# Patient Record
Sex: Male | Born: 1960 | Race: White | Hispanic: No | Smoking: Current every day smoker
Health system: Southern US, Community
[De-identification: ages and names within clinical notes are randomized; demographics above are authoritative.]

## PROBLEM LIST (undated history)

## (undated) DIAGNOSIS — K219 Gastro-esophageal reflux disease without esophagitis: Secondary | ICD-10-CM

## (undated) DIAGNOSIS — I2510 Atherosclerotic heart disease of native coronary artery without angina pectoris: Secondary | ICD-10-CM

## (undated) DIAGNOSIS — G4733 Obstructive sleep apnea (adult) (pediatric): Secondary | ICD-10-CM

## (undated) DIAGNOSIS — I8393 Asymptomatic varicose veins of bilateral lower extremities: Secondary | ICD-10-CM

## (undated) DIAGNOSIS — R739 Hyperglycemia, unspecified: Secondary | ICD-10-CM

## (undated) DIAGNOSIS — Z9289 Personal history of other medical treatment: Secondary | ICD-10-CM

## (undated) DIAGNOSIS — I739 Peripheral vascular disease, unspecified: Secondary | ICD-10-CM

## (undated) DIAGNOSIS — E119 Type 2 diabetes mellitus without complications: Secondary | ICD-10-CM

## (undated) DIAGNOSIS — I700 Atherosclerosis of aorta: Secondary | ICD-10-CM

## (undated) DIAGNOSIS — Z720 Tobacco use: Secondary | ICD-10-CM

## (undated) DIAGNOSIS — I714 Abdominal aortic aneurysm, without rupture, unspecified: Secondary | ICD-10-CM

## (undated) DIAGNOSIS — I10 Essential (primary) hypertension: Secondary | ICD-10-CM

## (undated) DIAGNOSIS — E785 Hyperlipidemia, unspecified: Secondary | ICD-10-CM

## (undated) DIAGNOSIS — I252 Old myocardial infarction: Secondary | ICD-10-CM

## (undated) HISTORY — DX: Asymptomatic varicose veins of bilateral lower extremities: I83.93

## (undated) HISTORY — DX: Peripheral vascular disease, unspecified: I73.9

## (undated) HISTORY — DX: Abdominal aortic aneurysm, without rupture: I71.4

## (undated) HISTORY — DX: Old myocardial infarction: I25.2

## (undated) HISTORY — DX: Essential (primary) hypertension: I10

## (undated) HISTORY — DX: Atherosclerosis of aorta: I70.0

## (undated) HISTORY — DX: Personal history of other medical treatment: Z92.89

## (undated) HISTORY — DX: Abdominal aortic aneurysm, without rupture, unspecified: I71.40

## (undated) HISTORY — DX: Atherosclerotic heart disease of native coronary artery without angina pectoris: I25.10

## (undated) HISTORY — DX: Obstructive sleep apnea (adult) (pediatric): G47.33

---

## 2004-10-16 ENCOUNTER — Emergency Department (HOSPITAL_COMMUNITY): Admission: EM | Admit: 2004-10-16 | Discharge: 2004-10-16 | Payer: Self-pay | Admitting: Emergency Medicine

## 2013-05-21 ENCOUNTER — Encounter (HOSPITAL_COMMUNITY): Payer: Self-pay | Admitting: Emergency Medicine

## 2013-05-21 ENCOUNTER — Emergency Department (HOSPITAL_COMMUNITY)
Admission: EM | Admit: 2013-05-21 | Discharge: 2013-05-21 | Disposition: A | Discharge status: Home / Self Care | Payer: Self-pay | Attending: Emergency Medicine | Admitting: Emergency Medicine

## 2013-05-21 ENCOUNTER — Emergency Department (HOSPITAL_COMMUNITY): Payer: Self-pay

## 2013-05-21 DIAGNOSIS — Y9389 Activity, other specified: Secondary | ICD-10-CM | POA: Insufficient documentation

## 2013-05-21 DIAGNOSIS — S86912A Strain of unspecified muscle(s) and tendon(s) at lower leg level, left leg, initial encounter: Secondary | ICD-10-CM

## 2013-05-21 DIAGNOSIS — Y929 Unspecified place or not applicable: Secondary | ICD-10-CM | POA: Insufficient documentation

## 2013-05-21 DIAGNOSIS — X500XXA Overexertion from strenuous movement or load, initial encounter: Secondary | ICD-10-CM | POA: Insufficient documentation

## 2013-05-21 DIAGNOSIS — F17200 Nicotine dependence, unspecified, uncomplicated: Secondary | ICD-10-CM | POA: Insufficient documentation

## 2013-05-21 DIAGNOSIS — IMO0002 Reserved for concepts with insufficient information to code with codable children: Secondary | ICD-10-CM | POA: Insufficient documentation

## 2013-05-21 DIAGNOSIS — R209 Unspecified disturbances of skin sensation: Secondary | ICD-10-CM | POA: Insufficient documentation

## 2013-05-21 MED ORDER — HYDROCODONE-ACETAMINOPHEN 5-325 MG PO TABS
1.0000 | ORAL_TABLET | Freq: Once | ORAL | Status: AC
  Administered 2013-05-21: 1 via ORAL
  Filled 2013-05-21: qty 1

## 2013-05-21 MED ORDER — PREDNISONE 20 MG PO TABS
60.0000 mg | ORAL_TABLET | Freq: Once | ORAL | Status: AC
  Administered 2013-05-21: 60 mg via ORAL
  Filled 2013-05-21: qty 3

## 2013-05-21 MED ORDER — PREDNISONE 10 MG PO TABS: 20.0000 mg | ORAL_TABLET | Freq: Every day | ORAL | Status: DC

## 2013-05-21 MED ORDER — HYDROCODONE-ACETAMINOPHEN 5-325 MG PO TABS: 1.0000 | ORAL_TABLET | Freq: Four times a day (QID) | ORAL | Status: DC | PRN

## 2013-05-21 NOTE — ED Provider Notes (Signed)
CSN: [id number]     Arrival date & time 05/21/13  2041 History  This chart was scribed for non-physician practitioner Earley FavorGail Deondray Ospina working with Shon Batonourtney F Horton, MD by Carl Bestelina Holson, ED Scribe. This patient was seen in room WTR7/WTR7 and the patient's care was started at 10:03 PM.    Chief Complaint  Patient presents with  . Knee Pain     Patient is a 53 y.o. male presenting with knee pain. The history is provided by the patient. No language interpreter was used.  Knee Pain  HPI Comments: William Young is a 53 y.o. male who presents to the Emergency Department complaining of constant left knee pain that started yesterday at 11 AM after the patient was getting off the toilet.  He states that he hear an intense popping sound at the time of the incident and experienced some pain.  He states that his knee pain eventually subsided and he went grocery shopping with his fiance.  He states that the pain returned at 7:30 PM - 8 PM last night after the patient went to bed.  He states that he elevated his knee while in bed.  He states that he woke up every hour and a half due to the pain.  He states that at 9:30 AM this morning, he had a hard time walking down the steps.  He states that when he sat down it felt like bone rubbing against bone.  He states that he took Extra Strength Tylenol with relief to his symptoms.  He states that he went to work and took another dose of Tylenol at 6:30 PM and experienced some soreness to his right knee.  He denies icing his knee.  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  He lists numbness and tingling to this side of his left leg bilaterally.  He states that the right knee is hot to the touch but his not erythematous.  He states that his knee usually pops.  The patient states that he is allergic to Aspirin and Ibuprofen.  The patient states that he is unsure as to whether he has a history of Arthritis.    History  reviewed. No pertinent past medical history. History reviewed. No pertinent past surgical history. History reviewed. No pertinent family history. History  Substance Use Topics  . Smoking status: Current Some Day Smoker    Types: Cigarettes  . Smokeless tobacco: Not on file  . Alcohol Use: Yes    Review of Systems  Musculoskeletal: Positive for arthralgias (left knee) and gait problem.  All other systems reviewed and are negative.    Allergies  Aspirin and Ibuprofen  Home Medications   Current Outpatient Rx  Name  Route  Sig  Dispense  Refill  . acetaminophen (TYLENOL) 500 MG tablet   Oral   Take 1,000 mg by mouth every 6 (six) hours as needed for mild pain.         Marland Kitchen. HYDROcodone-acetaminophen (NORCO/VICODIN) 5-325 MG per tablet   Oral   Take 1 tablet by mouth every 6 (six) hours as needed for moderate pain.   12 tablet   0   . predniSONE (DELTASONE) 10 MG tablet   Oral   Take 2 tablets (20 mg total) by mouth daily with breakfast.   13 tablet   0    Triage Vitals: BP 157/89  Pulse 75  Temp(Src) 97.8 F (36.6 C) (Oral)  Resp 18  Ht 6\' 3"  (1.905 m)  Wt 305 lb (138.347 kg)  BMI 38.12 kg/m2  SpO2 99%  Physical Exam  Nursing note and vitals reviewed. Constitutional: He is oriented to person, place, and time. He appears well-developed and well-nourished.  HENT:  Head: Normocephalic and atraumatic.  Right Ear: External ear normal.  Left Ear: External ear normal.  Eyes: Conjunctivae and EOM are normal. Pupils are equal, round, and reactive to light.  Neck: Normal range of motion and phonation normal. Neck supple.  Cardiovascular: Normal rate, regular rhythm, normal heart sounds and intact distal pulses.   Pulmonary/Chest: Effort normal and breath sounds normal. He exhibits no bony tenderness.  Abdominal: Soft. Normal appearance. There is no tenderness.  Musculoskeletal: Normal range of motion.       Left knee: He exhibits normal range of motion and no  erythema. Tenderness found. No MCL and no LCL tenderness noted.  Anterior drawer test is negative.    Neurological: He is alert and oriented to person, place, and time. No cranial nerve deficit or sensory deficit. He exhibits normal muscle tone. Coordination normal.  Skin: Skin is warm, dry and intact.  Psychiatric: He has a normal mood and affect. His behavior is normal. Judgment and thought content normal.    ED Course  Procedures (including critical care time)  DIAGNOSTIC STUDIES: Oxygen Saturation is 99% on room air, normal by my interpretation.    COORDINATION OF CARE: 10:21 PM- Discussed obtaining an x-ray of the patient's left knee with the patient and the patient agreed to the treatment plan.   Labs Review Labs Reviewed - No data to display Imaging Review No results found.  EKG Interpretation   None       MDM   1. Knee strain, left, initial encounter      I personally performed the services described in this documentation, which was scribed in my presence. The recorded information has been reviewed and is accurate.  Arman Filter, NP 05/24/13 2005

## 2013-05-21 NOTE — Discharge Instructions (Signed)
Cryotherapy °Cryotherapy means treatment with cold. Ice or gel packs can be used to reduce both pain and swelling. Ice is the most helpful within the first 24 to 48 hours after an injury or flareup from overusing a muscle or joint. Sprains, strains, spasms, burning pain, shooting pain, and aches can all be eased with ice. Ice can also be used when recovering from surgery. Ice is effective, has very few side effects, and is safe for most people to use. °PRECAUTIONS  °Ice is not a safe treatment option for people with: °· Raynaud's phenomenon. This is a condition affecting small blood vessels in the extremities. Exposure to cold may cause your problems to return. °· Cold hypersensitivity. There are many forms of cold hypersensitivity, including: °· Cold urticaria. Red, itchy hives appear on the skin when the tissues begin to warm after being iced. °· Cold erythema. This is a red, itchy rash caused by exposure to cold. °· Cold hemoglobinuria. Red blood cells break down when the tissues begin to warm after being iced. The hemoglobin that carry oxygen are passed into the urine because they cannot combine with blood proteins fast enough. °· Numbness or altered sensitivity in the area being iced. °If you have any of the following conditions, do not use ice until you have discussed cryotherapy with your caregiver: °· Heart conditions, such as arrhythmia, angina, or chronic heart disease. °· High blood pressure. °· Healing wounds or open skin in the area being iced. °· Current infections. °· Rheumatoid arthritis. °· Poor circulation. °· Diabetes. °Ice slows the blood flow in the region it is applied. This is beneficial when trying to stop inflamed tissues from spreading irritating chemicals to surrounding tissues. However, if you expose your skin to cold temperatures for too long or without the proper protection, you can damage your skin or nerves. Watch for signs of skin damage due to cold. °HOME CARE INSTRUCTIONS °Follow  these tips to use ice and cold packs safely. °· Place a dry or damp towel between the ice and skin. A damp towel will cool the skin more quickly, so you may need to shorten the time that the ice is used. °· For a more rapid response, add gentle compression to the ice. °· Ice for no more than 10 to 20 minutes at a time. The bonier the area you are icing, the less time it will take to get the benefits of ice. °· Check your skin after 5 minutes to make sure there are no signs of a poor response to cold or skin damage. °· Rest 20 minutes or more in between uses. °· Once your skin is numb, you can end your treatment. You can test numbness by very lightly touching your skin. The touch should be so light that you do not see the skin dimple from the pressure of your fingertip. When using ice, most people will feel these normal sensations in this order: cold, burning, aching, and numbness. °· Do not use ice on someone who cannot communicate their responses to pain, such as small children or people with dementia. °HOW TO MAKE AN ICE PACK °Ice packs are the most common way to use ice therapy. Other methods include ice massage, ice baths, and cryo-sprays. Muscle creams that cause a cold, tingly feeling do not offer the same benefits that ice offers and should not be used as a substitute unless recommended by your caregiver. °To make an ice pack, do one of the following: °· Place crushed ice or   a bag of frozen vegetables in a sealable plastic bag. Squeeze out the excess air. Place this bag inside another plastic bag. Slide the bag into a pillowcase or place a damp towel between your skin and the bag.  Mix 3 parts water with 1 part rubbing alcohol. Freeze the mixture in a sealable plastic bag. When you remove the mixture from the freezer, it will be slushy. Squeeze out the excess air. Place this bag inside another plastic bag. Slide the bag into a pillowcase or place a damp towel between your skin and the bag. SEEK MEDICAL  CARE IF:  You develop white spots on your skin. This may give the skin a blotchy (mottled) appearance.  Your skin turns blue or pale.  Your skin becomes waxy or hard.  Your swelling gets worse. MAKE SURE YOU:   Understand these instructions.  Will watch your condition.  Will get help right away if you are not doing well or get worse. Document Released: 12/22/2010 Document Revised: 07/20/2011 Document Reviewed: 12/22/2010 [facility] Patient Information 2014 [place], [place].  Joint Sprain A sprain is a tear or stretch in the ligaments that hold a joint together. Severe sprains may need as long as 3-6 weeks of immobilization and/or exercises to heal completely. Sprained joints should be rested and protected. If not, they can become unstable and prone to re-injury. Proper treatment can reduce your pain, shorten the period of disability, and reduce the risk of repeated injuries. TREATMENT   Rest and elevate the injured joint to reduce pain and swelling.  Apply ice packs to the injury for 20-30 minutes every 2-3 hours for the next 2-3 days.  Keep the injury wrapped in a compression bandage or splint as long as the joint is painful or as instructed by your caregiver.  Do not use the injured joint until it is completely healed to prevent re-injury and chronic instability. Follow the instructions of your caregiver.  Long-term sprain management may require exercises and/or treatment by a physical therapist. Taping or special braces may help stabilize the joint until it is completely better. SEEK MEDICAL CARE IF:   You develop increased pain or swelling of the joint.  You develop increasing redness and warmth of the joint.  You develop a fever.  It becomes stiff.  Your hand or foot gets cold or numb. Document Released: 06/04/2004 Document Revised: 07/20/2011 Document Reviewed: 05/14/2008 [facility] Patient Information 2014 [place], [place]. As discussed, your x-ray does not reveal any  effusion.  Your joint space is preserved, you do not have any osteopenia or weakness of the bones U. been given a prescription for 2 medications one is Vicodin or hydrocodone, which is a pain medication that, you can take as needed.  As directed for the next one to 2, days.  You've also been given a prescription for prednisone is a very potent anti-inflammatory which you can safely take and this was prescribed, because of your allergies to aspirin and ibuprofen.  Please take until completion you've also been given a referral to orthopedic surgery.  If your knee.  Pain is not improving Please perform your exercises, started on day 3, repeated exercise 5 times.  Once a day

## 2013-05-21 NOTE — ED Notes (Signed)
Pt arrived to the ED with a complaint of knee pain.  Pt states that he sat on the toilet and when he stood up he heard a pop.  Pt states that he was able to walk on it afterwards but as the day progressed his pain has increased.  Pt is able to walk on it and walked form the lobby to triage.

## 2013-05-25 NOTE — ED Provider Notes (Signed)
Medical screening examination/treatment/procedure(s) were performed by non-physician practitioner and as supervising physician I was immediately available for consultation/collaboration.  EKG Interpretation   None        Shon Batonourtney F Horton, MD 05/25/13 [phone]

## 2014-09-09 DIAGNOSIS — I2510 Atherosclerotic heart disease of native coronary artery without angina pectoris: Secondary | ICD-10-CM

## 2014-09-09 HISTORY — DX: Atherosclerotic heart disease of native coronary artery without angina pectoris: I25.10

## 2014-09-10 ENCOUNTER — Emergency Department (HOSPITAL_COMMUNITY): Payer: Medicaid Other

## 2014-09-10 ENCOUNTER — Inpatient Hospital Stay (HOSPITAL_COMMUNITY)
Admission: EM | Admit: 2014-09-10 | Discharge: 2014-09-12 | DRG: 247 | Disposition: A | Discharge status: Home / Self Care | Payer: Medicaid Other | Attending: Internal Medicine | Admitting: Internal Medicine

## 2014-09-10 ENCOUNTER — Encounter (HOSPITAL_COMMUNITY): Payer: Self-pay | Admitting: Emergency Medicine

## 2014-09-10 DIAGNOSIS — K219 Gastro-esophageal reflux disease without esophagitis: Secondary | ICD-10-CM | POA: Diagnosis present

## 2014-09-10 DIAGNOSIS — Z955 Presence of coronary angioplasty implant and graft: Secondary | ICD-10-CM

## 2014-09-10 DIAGNOSIS — I10 Essential (primary) hypertension: Secondary | ICD-10-CM | POA: Diagnosis present

## 2014-09-10 DIAGNOSIS — Z886 Allergy status to analgesic agent status: Secondary | ICD-10-CM | POA: Diagnosis not present

## 2014-09-10 DIAGNOSIS — I214 Non-ST elevation (NSTEMI) myocardial infarction: Secondary | ICD-10-CM | POA: Diagnosis present

## 2014-09-10 DIAGNOSIS — Z720 Tobacco use: Secondary | ICD-10-CM

## 2014-09-10 DIAGNOSIS — D696 Thrombocytopenia, unspecified: Secondary | ICD-10-CM | POA: Diagnosis present

## 2014-09-10 DIAGNOSIS — Z791 Long term (current) use of non-steroidal anti-inflammatories (NSAID): Secondary | ICD-10-CM

## 2014-09-10 DIAGNOSIS — E876 Hypokalemia: Secondary | ICD-10-CM | POA: Diagnosis not present

## 2014-09-10 DIAGNOSIS — I472 Ventricular tachycardia: Secondary | ICD-10-CM | POA: Diagnosis present

## 2014-09-10 DIAGNOSIS — I252 Old myocardial infarction: Secondary | ICD-10-CM | POA: Diagnosis present

## 2014-09-10 DIAGNOSIS — E1165 Type 2 diabetes mellitus with hyperglycemia: Secondary | ICD-10-CM | POA: Diagnosis present

## 2014-09-10 DIAGNOSIS — F17210 Nicotine dependence, cigarettes, uncomplicated: Secondary | ICD-10-CM | POA: Diagnosis present

## 2014-09-10 DIAGNOSIS — Z79899 Other long term (current) drug therapy: Secondary | ICD-10-CM | POA: Diagnosis not present

## 2014-09-10 DIAGNOSIS — Z6837 Body mass index (BMI) 37.0-37.9, adult: Secondary | ICD-10-CM | POA: Diagnosis not present

## 2014-09-10 DIAGNOSIS — I25110 Atherosclerotic heart disease of native coronary artery with unstable angina pectoris: Secondary | ICD-10-CM | POA: Diagnosis present

## 2014-09-10 DIAGNOSIS — Z8249 Family history of ischemic heart disease and other diseases of the circulatory system: Secondary | ICD-10-CM

## 2014-09-10 DIAGNOSIS — I25119 Atherosclerotic heart disease of native coronary artery with unspecified angina pectoris: Secondary | ICD-10-CM | POA: Insufficient documentation

## 2014-09-10 DIAGNOSIS — R739 Hyperglycemia, unspecified: Secondary | ICD-10-CM

## 2014-09-10 DIAGNOSIS — I2510 Atherosclerotic heart disease of native coronary artery without angina pectoris: Secondary | ICD-10-CM | POA: Insufficient documentation

## 2014-09-10 HISTORY — DX: Hyperglycemia, unspecified: R73.9

## 2014-09-10 HISTORY — DX: Old myocardial infarction: I25.2

## 2014-09-10 HISTORY — DX: Gastro-esophageal reflux disease without esophagitis: K21.9

## 2014-09-10 HISTORY — DX: Tobacco use: Z72.0

## 2014-09-10 LAB — COMPREHENSIVE METABOLIC PANEL
ALK PHOS: 95 U/L (ref 38–126)
ALT: 23 U/L (ref 17–63)
AST: 32 U/L (ref 15–41)
Albumin: 3.5 g/dL (ref 3.5–5.0)
Anion gap: 8 (ref 5–15)
BILIRUBIN TOTAL: 1.2 mg/dL (ref 0.3–1.2)
BUN: 10 mg/dL (ref 6–20)
CO2: 19 mmol/L — ABNORMAL LOW (ref 22–32)
CREATININE: 0.9 mg/dL (ref 0.61–1.24)
Calcium: 8.5 mg/dL — ABNORMAL LOW (ref 8.9–10.3)
Chloride: 109 mmol/L (ref 101–111)
GLUCOSE: 189 mg/dL — AB (ref 70–99)
POTASSIUM: 5 mmol/L (ref 3.5–5.1)
Sodium: 136 mmol/L (ref 135–145)
Total Protein: 6.3 g/dL — ABNORMAL LOW (ref 6.5–8.1)

## 2014-09-10 LAB — I-STAT TROPONIN, ED: Troponin i, poc: 0.79 ng/mL (ref 0.00–0.08)

## 2014-09-10 LAB — TROPONIN I
Troponin I: 4.6 ng/mL (ref <0.031)
Troponin I: 12.14 ng/mL (ref <0.031)

## 2014-09-10 LAB — LIPID PANEL
Cholesterol: 216 mg/dL — ABNORMAL HIGH (ref 0–200)
HDL: 36 mg/dL — ABNORMAL LOW (ref >40)
LDL Cholesterol: 159 mg/dL — ABNORMAL HIGH (ref 0–99)
Total CHOL/HDL Ratio: 6 RATIO
Triglycerides: 105 mg/dL (ref <150)
VLDL: 21 mg/dL (ref 0–40)

## 2014-09-10 LAB — CBC
HCT: 41.8 % (ref 39.0–52.0)
HEMOGLOBIN: 14.4 g/dL (ref 13.0–17.0)
MCH: 31.2 pg (ref 26.0–34.0)
MCHC: 34.4 g/dL (ref 30.0–36.0)
MCV: 90.5 fL (ref 78.0–100.0)
Platelets: 144 10*3/uL — ABNORMAL LOW (ref 150–400)
RBC: 4.62 MIL/uL (ref 4.22–5.81)
RDW: 13.6 % (ref 11.5–15.5)
WBC: 8 10*3/uL (ref 4.0–10.5)

## 2014-09-10 LAB — MAGNESIUM: Magnesium: 2.2 mg/dL (ref 1.7–2.4)

## 2014-09-10 LAB — TSH: TSH: 1.661 u[IU]/mL (ref 0.350–4.500)

## 2014-09-10 LAB — PROTIME-INR
INR: 1.07 (ref 0.00–1.49)
PROTHROMBIN TIME: 14 s (ref 11.6–15.2)

## 2014-09-10 MED ORDER — ACETAMINOPHEN 325 MG PO TABS
650.0000 mg | ORAL_TABLET | ORAL | Status: DC | PRN
  Administered 2014-09-10 – 2014-09-11 (×4): 650 mg via ORAL
  Filled 2014-09-10 (×4): qty 2

## 2014-09-10 MED ORDER — SODIUM CHLORIDE 0.9 % IJ SOLN: 3.0000 mL | Freq: Two times a day (BID) | INTRAMUSCULAR | Status: DC

## 2014-09-10 MED ORDER — HYDROCODONE-ACETAMINOPHEN 5-325 MG PO TABS: 2.0000 | ORAL_TABLET | Freq: Once | ORAL | Status: DC

## 2014-09-10 MED ORDER — ACTIVE PARTNERSHIP FOR HEALTH OF YOUR HEART BOOK
Freq: Once | Status: AC
  Administered 2014-09-10: 20:00:00
  Filled 2014-09-10: qty 1

## 2014-09-10 MED ORDER — SODIUM CHLORIDE 0.9 % WEIGHT BASED INFUSION: 1.0000 mL/kg/h | INTRAVENOUS | Status: DC

## 2014-09-10 MED ORDER — HEPARIN BOLUS VIA INFUSION
4000.0000 [IU] | Freq: Once | INTRAVENOUS | Status: AC
  Administered 2014-09-10: 4000 [IU] via INTRAVENOUS
  Filled 2014-09-10: qty 4000

## 2014-09-10 MED ORDER — NITROGLYCERIN 0.4 MG SL SUBL: 0.4000 mg | SUBLINGUAL_TABLET | SUBLINGUAL | Status: DC | PRN

## 2014-09-10 MED ORDER — ATORVASTATIN CALCIUM 80 MG PO TABS
80.0000 mg | ORAL_TABLET | Freq: Every day | ORAL | Status: DC
  Administered 2014-09-10 – 2014-09-11 (×2): 80 mg via ORAL
  Filled 2014-09-10: qty 1
  Filled 2014-09-10: qty 2
  Filled 2014-09-10 (×2): qty 1

## 2014-09-10 MED ORDER — SODIUM CHLORIDE 0.9 % IJ SOLN: 3.0000 mL | INTRAMUSCULAR | Status: DC | PRN

## 2014-09-10 MED ORDER — CARVEDILOL 6.25 MG PO TABS
6.2500 mg | ORAL_TABLET | Freq: Two times a day (BID) | ORAL | Status: DC
  Administered 2014-09-10 – 2014-09-11 (×3): 6.25 mg via ORAL
  Filled 2014-09-10 (×2): qty 1
  Filled 2014-09-10 (×2): qty 2
  Filled 2014-09-10 (×4): qty 1

## 2014-09-10 MED ORDER — NITROGLYCERIN 2 % TD OINT
1.0000 [in_us] | TOPICAL_OINTMENT | Freq: Four times a day (QID) | TRANSDERMAL | Status: DC
  Administered 2014-09-10 – 2014-09-12 (×6): 1 [in_us] via TOPICAL
  Filled 2014-09-10 (×2): qty 30

## 2014-09-10 MED ORDER — ZOLPIDEM TARTRATE 5 MG PO TABS: 5.0000 mg | ORAL_TABLET | Freq: Every evening | ORAL | Status: DC | PRN

## 2014-09-10 MED ORDER — NITROGLYCERIN 2 % TD OINT
0.5000 [in_us] | TOPICAL_OINTMENT | Freq: Once | TRANSDERMAL | Status: AC
  Administered 2014-09-10: 0.5 [in_us] via TOPICAL
  Filled 2014-09-10: qty 1

## 2014-09-10 MED ORDER — ALPRAZOLAM 0.5 MG PO TABS: 0.5000 mg | ORAL_TABLET | Freq: Four times a day (QID) | ORAL | Status: DC | PRN

## 2014-09-10 MED ORDER — SODIUM CHLORIDE 0.9 % IV SOLN: 250.0000 mL | INTRAVENOUS | Status: DC | PRN

## 2014-09-10 MED ORDER — HEART ATTACK BOUNCING BOOK
Freq: Once | Status: AC
  Administered 2014-09-10: 20:00:00
  Filled 2014-09-10: qty 1

## 2014-09-10 MED ORDER — MORPHINE SULFATE 4 MG/ML IJ SOLN
4.0000 mg | Freq: Once | INTRAMUSCULAR | Status: AC
  Administered 2014-09-10: 4 mg via INTRAVENOUS
  Filled 2014-09-10: qty 1

## 2014-09-10 MED ORDER — ONDANSETRON HCL 4 MG/2ML IJ SOLN: 4.0000 mg | Freq: Four times a day (QID) | INTRAMUSCULAR | Status: DC | PRN

## 2014-09-10 MED ORDER — NICOTINE 21 MG/24HR TD PT24
21.0000 mg | MEDICATED_PATCH | Freq: Every day | TRANSDERMAL | Status: DC
  Administered 2014-09-10 – 2014-09-12 (×3): 21 mg via TRANSDERMAL
  Filled 2014-09-10 (×3): qty 1

## 2014-09-10 MED ORDER — HEPARIN (PORCINE) IN NACL 100-0.45 UNIT/ML-% IJ SOLN
2100.0000 [IU]/h | INTRAMUSCULAR | Status: DC
  Administered 2014-09-10: 1600 [IU]/h via INTRAVENOUS
  Filled 2014-09-10 (×5): qty 250

## 2014-09-10 MED ORDER — PNEUMOCOCCAL VAC POLYVALENT 25 MCG/0.5ML IJ INJ
0.5000 mL | INJECTION | INTRAMUSCULAR | Status: AC
  Administered 2014-09-12: 0.5 mL via INTRAMUSCULAR
  Filled 2014-09-10: qty 0.5

## 2014-09-10 MED ORDER — SODIUM CHLORIDE 0.9 % WEIGHT BASED INFUSION
3.0000 mL/kg/h | INTRAVENOUS | Status: DC
  Administered 2014-09-11: 3 mL/kg/h via INTRAVENOUS

## 2014-09-10 NOTE — ED Notes (Signed)
Lab called to add on heparin level  

## 2014-09-10 NOTE — ED Notes (Signed)
Consent signed for cardiac catherization.

## 2014-09-10 NOTE — H&P (Addendum)
History and Physical   Patient ID: William Young MRN: [id number], DOB/AGE: 54/12/1960 54 y.o. Date of Encounter: 09/10/2014  Primary Physician: No PCP Per Patient Primary Cardiologist: New  Chief Complaint:  Chest pain  HPI: William Young is a 54 y.o. male with no history of CAD. He has not had a physical since he got out of high school.   This am, he woke with chest pain at about 6:45 AM. It was different from his usual heartburn. It was a pressure and reached an 8/10. It went through to his back with a sharp pain. He was slightly short of breath but had no nausea, vomiting, or diaphoresis. He took Tylenol and Zantac with minimal relief. When his symptoms had not resolved, and he developed left arm numbness, his fianc brought him to the emergency room.  In the emergency room, he received nitroglycerin ointment, morphine, hydrocodone and heparin. With nitroglycerin ointment, his symptoms resolved and currently his pain level is a 0/10. He has never had any pain like this before. He is busy and moderately active with his job, without ever having these symptoms. He does not exercise. He has no recent illnesses or injuries. He denies any history of melena. He has occasional reflux symptoms, mostly associated with spicy foods. He initially thought it was reflux this morning, because he had spaghetti last night. However, the quality of the pain was different, it was worse than usual, and it did not resolve with antacids as he usually does.  Past Medical History  Diagnosis Date  . GERD (gastroesophageal reflux disease)   . Tobacco use   . Hyperglycemia     Surgical History:  Past Surgical History  Procedure Laterality Date  . None      I have reviewed the patient's current medications. Medication Sig  acetaminophen (TYLENOL) 500 MG tablet Take 1,000 mg by mouth every 6 (six) hours as needed for mild pain.  ranitidine (ZANTAC) 150 MG tablet Take 150 mg by mouth once.    HYDROcodone-acetaminophen (NORCO/VICODIN) 5-325 MG per tablet Take 1 tablet by mouth every 6 (six) hours as needed for moderate pain. Patient not taking: Reported on 09/10/2014  predniSONE (DELTASONE) 10 MG tablet Take 2 tablets (20 mg total) by mouth daily with breakfast. Patient not taking: Reported on 09/10/2014   Scheduled Meds:  Continuous Infusions: . heparin 1,600 Units/hr (09/10/14 1329)   Allergies:  Allergies  Allergen Reactions  . Aspirin Anaphylaxis, Swelling and Rash    Throat closes   . Ibuprofen Anaphylaxis, Swelling and Rash    History   Social History  . Marital Status: Widowed    Spouse Name: N/A  . Number of Children: N/A  . Years of Education: N/A   Occupational History  . 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 Smoking status: Current Every Day Smoker -- 1.00 packs/day for 35 years    Types: Cigarettes  . Smokeless tobacco: Never Used  . Alcohol Use: 0.0 oz/week    0 Standard drinks or equivalent per week     Comment: 64 oz beer most nights  . Drug Use: No  . Sexual Activity: Yes   Other Topics Concern  . Not on file   Social History Narrative   Lives with fiance and 2 daughters. Neither his parents nor siblings have any cardiac issues that he knows of.    Family History  Problem Relation Age of Onset  . Heart attack Maternal Grandfather  Family Status  Relation Status Death Age  . Mother Alive     Born [phone]  . Father Alive     Born [phone]  . Sister Alive   . Brother Alive     Review of Systems:   Full 14-point review of systems otherwise negative except as noted above.  Physical Exam: Blood pressure 157/82, pulse 60, temperature 97.9 F (36.6 C), temperature source Oral, resp. rate 20, height  (1.905 m), weight 295 lb (133.811 kg), SpO2 95 %. General: Well developed, well nourished,male in no acute distress. Head: Normocephalic, atraumatic, sclera non-icteric, no xanthomas, nares are without  discharge. Dentition: Poor Neck: No carotid bruits. JVD not elevated. No thyromegally Lungs: Good expansion bilaterally. without wheezes or rhonchi. Few crackles R base Heart: Regular rate and rhythm with S1 S2.  No S3 or S4.  No murmur, no rubs, or gallops appreciated. Abdomen: Soft, non-tender, non-distended with normoactive bowel sounds. No hepatomegaly. No rebound/guarding. No obvious abdominal masses. Msk:  Strength and tone appear normal for age. No joint deformities or effusions, no spine or costo-vertebral angle tenderness. Extremities: No clubbing or cyanosis. No edema.  Distal pedal pulses are 2+ in 4 extrem Neuro: Alert and oriented X 3. Moves all extremities spontaneously. No focal deficits noted. Psych:  Responds to questions appropriately with a normal affect. Skin: No rashes or lesions noted  Labs:   Lab Results  Component Value Date   WBC 8.0 09/10/2014   HGB 14.4 09/10/2014   HCT 41.8 09/10/2014   MCV 90.5 09/10/2014   PLT 144* 09/10/2014     Recent Labs Lab 09/10/14 1145  NA 136  K 5.0  CL 109  CO2 19*  BUN 10  CREATININE 0.90  CALCIUM 8.5*  PROT 6.3*  BILITOT 1.2  ALKPHOS 95  ALT 23  AST 32  GLUCOSE 189*   No results for input(s): CKTOTAL, CKMB, TROPONINI in the last 72 hours.  Recent Labs  09/10/14 1206  TROPIPOC 0.79*   Radiology/Studies: Dg Chest 2 View 09/10/2014   CLINICAL DATA:  Chest pain.  Shortness of breath .  EXAM: CHEST  2 VIEW  COMPARISON:  None.  FINDINGS: Mediastinum hilar structures normal. Lungs are clear. Heart size normal. No pleural effusion or pneumothorax cardiomegaly. No acute bony abnormality .  IMPRESSION: No acute cardiopulmonary disease.   Electronically Signed   By: Maisie Fus  Register   On: 09/10/2014 11:34   ECG: Sinus rhythm, inverted T waves in lead 3 and aVL, no old available for comparison  ASSESSMENT AND PLAN:  Principal Problem:   NSTEMI, initial episode of care - Admit, continue heparin - No ASA due to true  allergy  - Add beta blocker, statin - The risks and benefits of a cardiac catheterization including, but not limited to, death, stroke, MI, kidney damage and bleeding were discussed with the patient who indicates understanding and agrees to proceed.    Active Problems:   Hypertension, uncontrolled - Add beta blocker and hydralazine for now, may need ace/ARB or diuretic.    Hyperglycemia - Check hemoglobin A1c    Tobacco use - Cessation encouraged, add nicotine patch    GERD (gastroesophageal reflux disease) - Prior to admission was taking H2 blocker when necessary, will order daily  HCM  Check lipids    Signed, Leanna Battles 09/10/2014 3:59 PM Beeper [phone]  Patient seen and examined. Agree with findings as noted by R Barrett above    I have amended note to reflect my  findings  Patient with CP and positive troponin  Now pain free  EKG without change  I would recomm L heart cath to define anatomy  Discussed with patient who agrees to proceed.  Dietrich Pates

## 2014-09-10 NOTE — ED Provider Notes (Signed)
CSN: [id number]     Arrival date & time 09/10/14  1013 History   First MD Initiated Contact with Patient 09/10/14 1106     Chief Complaint  Patient presents with  . Chest Pain     (Consider location/radiation/quality/duration/timing/severity/associated sxs/prior Treatment) Patient is a 54 y.o. male presenting with chest pain. The history is provided by the patient. No language interpreter was used.  Chest Pain William Young is a 54 y.o male who presents with new onset constant chest pain that feels like a heaviness since 7:30am today when he woke up. The pain radiates to his back. He states that he felt short of breath when it started and the pain was originally 10/10. He thought it was GERD so he took a zantac and some tylenol and it was a little better but his pain is still 5/10 now.  He was on the way to work but told his wife that he thought he should come to the hospital. He is allergic to aspririn.  No significant family cardiac history. No past medical history but does not go to the doctor according to his wife.  He denies a fever, diaphoresis, headache, abdominal pain, nausea, vomiting, urinary symptoms, or leg swelling.   History reviewed. No pertinent past medical history. History reviewed. No pertinent past surgical history. History reviewed. No pertinent family history. History  Substance Use Topics  . Smoking status: Current Every Day Smoker    Types: Cigarettes  . Smokeless tobacco: Not on file  . Alcohol Use: Yes    Review of Systems  Cardiovascular: Positive for chest pain.  All other systems reviewed and are negative.     Allergies  Aspirin and Ibuprofen  Home Medications   Prior to Admission medications   Medication Sig Start Date End Date Taking? Authorizing Provider  acetaminophen (TYLENOL) 500 MG tablet Take 1,000 mg by mouth every 6 (six) hours as needed for mild pain.   Yes Historical Provider, MD  ranitidine (ZANTAC) 150 MG tablet Take 150 mg by mouth  once.   Yes Historical Provider, MD  HYDROcodone-acetaminophen (NORCO/VICODIN) 5-325 MG per tablet Take 1 tablet by mouth every 6 (six) hours as needed for moderate pain. Patient not taking: Reported on 09/10/2014 05/21/13   Earley Favor, NP  predniSONE (DELTASONE) 10 MG tablet Take 2 tablets (20 mg total) by mouth daily with breakfast. Patient not taking: Reported on 09/10/2014 05/21/13   Earley Favor, NP   BP 143/95 mmHg  Pulse 59  Temp(Src) 97.9 F (36.6 C) (Oral)  Resp 18  Ht  (1.905 m)  Wt 295 lb (133.811 kg)  BMI 36.87 kg/m2  SpO2 96% Physical Exam  Constitutional: He is oriented to person, place, and time. He appears well-developed and well-nourished.  HENT:  Head: Normocephalic and atraumatic.  Eyes: Conjunctivae are normal.  Neck: Normal range of motion. Neck supple.  Cardiovascular: Normal rate, regular rhythm and normal heart sounds.   Pulmonary/Chest: Effort normal and breath sounds normal. No respiratory distress. He has no wheezes. He has no rales. He exhibits tenderness.  Abdominal: Soft. There is no tenderness.  Musculoskeletal: Normal range of motion. He exhibits no edema.  Neurological: He is alert and oriented to person, place, and time.  Skin: Skin is warm and dry.    ED Course  Procedures (including critical care time) Labs Review Labs Reviewed  CBC - Abnormal; Notable for the following:    Platelets 144 (*)    All other components within normal limits  I-STAT TROPOININ, ED - Abnormal; Notable for the following:    Troponin i, poc 0.79 (*)    All other components within normal limits  COMPREHENSIVE METABOLIC PANEL    Imaging Review Dg Chest 2 View  09/10/2014   CLINICAL DATA:  Chest pain.  Shortness of breath .  EXAM: CHEST  2 VIEW  COMPARISON:  None.  FINDINGS: Mediastinum hilar structures normal. Lungs are clear. Heart size normal. No pleural effusion or pneumothorax cardiomegaly. No acute bony abnormality .  IMPRESSION: No acute cardiopulmonary disease.    Electronically Signed   By: Maisie Fushomas  Register   On: 09/10/2014 11:34     EKG Interpretation   Date/Time:  Monday Sep 10 2014 10:33:43 EDT Ventricular Rate:  77 PR Interval:  162 QRS Duration: 96 QT Interval:  386 QTC Calculation: 436 R Axis:   -30 Text Interpretation:  Normal sinus rhythm Left axis deviation Nonspecific  ST abnormality Abnormal ECG No old tracing to compare Confirmed by Mirian MoGentry,  Matthew [phone]([id number]) on 09/10/2014 11:10:48 AM     CRITICAL CARE Performed by: Catha GosselinPatel-Mills, Meilyn Heindl   Total critical care time: 35  Critical care time was exclusive of separately billable procedures and treating other patients.  Critical care was necessary to treat or prevent imminent or life-threatening deterioration.  Critical care was time spent personally by me on the following activities: development of treatment plan with patient and/or surrogate as well as nursing, discussions with consultants, evaluation of patient's response to treatment, examination of patient, obtaining history from patient or surrogate, ordering and performing treatments and interventions, ordering and review of laboratory studies, ordering and review of radiographic studies, pulse oximetry and re-evaluation of patient's condition.  MDM   Final diagnoses:  NSTEMI (non-ST elevated myocardial infarction)  Thrombocytopenia   Patient presents for chest pain that began this morning at 7:30 am which he describes a constant and radiating to his back.  He has no cardiac history and does not see a pcp regularly.  No prior stress test or ECHO.  I have ordered nitro since he is allergic to aspirin.  His chest xray is negative for pneumothorax, edema, or pneumonia. 12:20 Lab called with elevated troponin at .79.  I spoke to Dr. Littie DeedsGentry regarding this case and he has seen the patient. I have started a heparin drip and morphine. I will get nurse to check BP in both arms.  His EKG at this time does not show STEMI.  Patient appears  stable and in no acute distress. His vitals are stable.  His blood pressure is 143/95.    12:27 I spoke to Trish from cardiology who and they will see the patient.  14:15 Patient is still in ED.  He is pain free after nitro and morphine.    Catha GosselinHanna Patel-Mills, PA-C 09/10/14 1611  Mirian MoMatthew Gentry, MD 09/11/14 [phone]

## 2014-09-10 NOTE — ED Notes (Signed)
Pt here from home with c/o chest pain that started this am , also sob and dizziness , pt radiated to left arm and back

## 2014-09-10 NOTE — ED Notes (Signed)
Rhonda NP at bedside  

## 2014-09-10 NOTE — ED Notes (Signed)
Lorelle FormosaHanna, PA notified of elevated troponin.

## 2014-09-10 NOTE — ED Notes (Addendum)
Pt denies chest pain at present; awaiting cath lab. Family at bedside. Pt reports being woken from sleep this morning with chest pressure, different than pain he's felt before. Pain persisted and pt began having pain to his left arm with shortness of breath. Nitro paste applied; pt pain free since nitro was placed

## 2014-09-10 NOTE — ED Notes (Signed)
Lab results of Troponin of 0.79 reported to [place].

## 2014-09-10 NOTE — Progress Notes (Signed)
ANTICOAGULATION CONSULT NOTE - Initial Consult  Pharmacy Consult for heparin Indication: NSTEMI  Allergies  Allergen Reactions  . Aspirin Anaphylaxis, Swelling and Rash    Throat closes   . Ibuprofen Anaphylaxis, Swelling and Rash    Patient Measurements: Height: 6\' 3"  (190.5 cm) Weight: 295 lb (133.811 kg) IBW/kg (Calculated) : 84.5 Heparin Dosing Weight: 114 kg  Vital Signs: Temp: 97.9 F (36.6 C) (05/02 1036) Temp Source: Oral (05/02 1036) BP: 184/88 mmHg (05/02 1230) Pulse Rate: 64 (05/02 1230)  Labs:  Recent Labs  09/10/14 1145  HGB 14.4  HCT 41.8  PLT 144*  CREATININE 0.90    Estimated Creatinine Clearance: 139.9 mL/min (by C-G formula based on Cr of 0.9).   Medical History: History reviewed. No pertinent past medical history.  Medications:  See electronic PTA med list  Assessment: 54 y/o male who presented to the ED with chest pain, SOB, and dizziness. His troponin is positive. Pharmacy consulted to begin IV heparin. Renal function is normal. Hb is normal and platelets are minimally low. No bleeding noted. He reports an anaphylactic reaction to aspirin.  Goal of Therapy:  Heparin level 0.3-0.7 units/ml Monitor platelets by anticoagulation protocol: Yes   Plan:  - Heparin 4000 units IV bolus then 1600 units/hr - 6 hr heparin level - Daily heparin level and CBC - Follow-up plan for cath - Monitor for s/sx of bleeding  [facility] Out[facility] Center LLCJennifer Ackerman, CokevillePharm.D., BCPS Clinical Pharmacist Pager: [phone] 09/10/2014 12:53 PM

## 2014-09-10 NOTE — ED Notes (Signed)
Pt scheduled for cath lab tomorrow

## 2014-09-10 NOTE — ED Notes (Signed)
Pharmacy called to send heparin  

## 2014-09-11 ENCOUNTER — Encounter (HOSPITAL_COMMUNITY)
Admission: EM | Disposition: A | Discharge status: Home / Self Care | Payer: Self-pay | Source: Home / Self Care | Attending: Internal Medicine

## 2014-09-11 DIAGNOSIS — E785 Hyperlipidemia, unspecified: Secondary | ICD-10-CM

## 2014-09-11 DIAGNOSIS — I25119 Atherosclerotic heart disease of native coronary artery with unspecified angina pectoris: Secondary | ICD-10-CM | POA: Insufficient documentation

## 2014-09-11 DIAGNOSIS — I2510 Atherosclerotic heart disease of native coronary artery without angina pectoris: Secondary | ICD-10-CM | POA: Insufficient documentation

## 2014-09-11 HISTORY — PX: CARDIAC CATHETERIZATION: SHX172

## 2014-09-11 LAB — CBC
HCT: 38.2 % — ABNORMAL LOW (ref 39.0–52.0)
HEMOGLOBIN: 13 g/dL (ref 13.0–17.0)
MCH: 31 pg (ref 26.0–34.0)
MCHC: 34 g/dL (ref 30.0–36.0)
MCV: 91.2 fL (ref 78.0–100.0)
PLATELETS: 136 10*3/uL — AB (ref 150–400)
RBC: 4.19 MIL/uL — ABNORMAL LOW (ref 4.22–5.81)
RDW: 13.4 % (ref 11.5–15.5)
WBC: 7.8 10*3/uL (ref 4.0–10.5)

## 2014-09-11 LAB — BASIC METABOLIC PANEL
Anion gap: 8 (ref 5–15)
BUN: 10 mg/dL (ref 6–20)
CO2: 21 mmol/L — ABNORMAL LOW (ref 22–32)
Calcium: 8.4 mg/dL — ABNORMAL LOW (ref 8.9–10.3)
Chloride: 106 mmol/L (ref 101–111)
Creatinine, Ser: 0.95 mg/dL (ref 0.61–1.24)
Glucose, Bld: 207 mg/dL — ABNORMAL HIGH (ref 70–99)
Potassium: 3.7 mmol/L (ref 3.5–5.1)
Sodium: 135 mmol/L (ref 135–145)

## 2014-09-11 LAB — HEMOGLOBIN A1C
HEMOGLOBIN A1C: 7 % — AB (ref 4.8–5.6)
MEAN PLASMA GLUCOSE: 154 mg/dL

## 2014-09-11 LAB — TROPONIN I: TROPONIN I: 15.02 ng/mL — AB (ref <0.031)

## 2014-09-11 LAB — HEPARIN LEVEL (UNFRACTIONATED): Heparin Unfractionated: <0.1 IU/mL — ABNORMAL LOW (ref 0.30–0.70)

## 2014-09-11 LAB — POCT ACTIVATED CLOTTING TIME: ACTIVATED CLOTTING TIME: 515 s

## 2014-09-11 SURGERY — LEFT HEART CATH AND CORONARY ANGIOGRAPHY

## 2014-09-11 MED ORDER — HEPARIN BOLUS VIA INFUSION
3000.0000 [IU] | Freq: Once | INTRAVENOUS | Status: AC
  Administered 2014-09-11: 03:00:00 3000 [IU] via INTRAVENOUS
  Filled 2014-09-11: qty 3000

## 2014-09-11 MED ORDER — HEPARIN (PORCINE) IN NACL 2-0.9 UNIT/ML-% IJ SOLN
INTRAMUSCULAR | Status: AC
  Filled 2014-09-11: qty 1000

## 2014-09-11 MED ORDER — SODIUM CHLORIDE 0.9 % WEIGHT BASED INFUSION: 3.0000 mL/kg/h | INTRAVENOUS | Status: AC

## 2014-09-11 MED ORDER — TICAGRELOR 90 MG PO TABS
ORAL_TABLET | ORAL | Status: DC | PRN
  Administered 2014-09-11: 180 mg via ORAL

## 2014-09-11 MED ORDER — MIDAZOLAM HCL 2 MG/2ML IJ SOLN
INTRAMUSCULAR | Status: AC
  Filled 2014-09-11: qty 2

## 2014-09-11 MED ORDER — SODIUM CHLORIDE 0.9 % IJ SOLN
3.0000 mL | Freq: Two times a day (BID) | INTRAMUSCULAR | Status: DC
  Administered 2014-09-11: 3 mL via INTRAVENOUS

## 2014-09-11 MED ORDER — TICAGRELOR 90 MG PO TABS
ORAL_TABLET | ORAL | Status: AC
  Filled 2014-09-11: qty 1

## 2014-09-11 MED ORDER — HEPARIN SODIUM (PORCINE) 1000 UNIT/ML IJ SOLN
INTRAMUSCULAR | Status: DC | PRN
  Administered 2014-09-11: 7000 [IU] via INTRAVENOUS

## 2014-09-11 MED ORDER — SODIUM CHLORIDE 0.9 % IJ SOLN
INTRAMUSCULAR | Status: DC | PRN
  Administered 2014-09-11: 10:00:00 via INTRA_ARTERIAL

## 2014-09-11 MED ORDER — BIVALIRUDIN 250 MG IV SOLR
INTRAVENOUS | Status: AC
  Filled 2014-09-11: qty 250

## 2014-09-11 MED ORDER — VERAPAMIL HCL 2.5 MG/ML IV SOLN
INTRAVENOUS | Status: AC
  Filled 2014-09-11: qty 2

## 2014-09-11 MED ORDER — SODIUM CHLORIDE 0.9 % IV SOLN
250.0000 mg | INTRAVENOUS | Status: DC | PRN
  Administered 2014-09-11: 1 mg/kg/h via INTRAVENOUS

## 2014-09-11 MED ORDER — LIDOCAINE HCL (PF) 1 % IJ SOLN
INTRAMUSCULAR | Status: AC
  Filled 2014-09-11: qty 30

## 2014-09-11 MED ORDER — SODIUM CHLORIDE 0.9 % IV SOLN: 250.0000 mL | INTRAVENOUS | Status: DC | PRN

## 2014-09-11 MED ORDER — FENTANYL CITRATE (PF) 100 MCG/2ML IJ SOLN
INTRAMUSCULAR | Status: AC
  Filled 2014-09-11: qty 2

## 2014-09-11 MED ORDER — NITROGLYCERIN 1 MG/10 ML FOR IR/CATH LAB
INTRA_ARTERIAL | Status: AC
  Filled 2014-09-11: qty 10

## 2014-09-11 MED ORDER — TICAGRELOR 90 MG PO TABS
90.0000 mg | ORAL_TABLET | Freq: Two times a day (BID) | ORAL | Status: DC
  Administered 2014-09-11 – 2014-09-12 (×2): 90 mg via ORAL
  Filled 2014-09-11 (×2): qty 1

## 2014-09-11 MED ORDER — FENTANYL CITRATE (PF) 100 MCG/2ML IJ SOLN
INTRAMUSCULAR | Status: DC | PRN
  Administered 2014-09-11: 50 ug via INTRAVENOUS

## 2014-09-11 MED ORDER — BIVALIRUDIN BOLUS VIA INFUSION
INTRAVENOUS | Status: DC | PRN
  Administered 2014-09-11: 14.02 mg via INTRAVENOUS

## 2014-09-11 MED ORDER — SODIUM CHLORIDE 0.9 % IJ SOLN
3.0000 mL | INTRAMUSCULAR | Status: DC | PRN
  Administered 2014-09-11: 11:00:00 3 mL via INTRAVENOUS
  Filled 2014-09-11 (×2): qty 3

## 2014-09-11 MED ORDER — MORPHINE SULFATE 2 MG/ML IJ SOLN: 2.0000 mg | INTRAMUSCULAR | Status: DC | PRN

## 2014-09-11 MED ORDER — HEPARIN SODIUM (PORCINE) 1000 UNIT/ML IJ SOLN
INTRAMUSCULAR | Status: AC
  Filled 2014-09-11: qty 1

## 2014-09-11 MED ORDER — IOHEXOL 350 MG/ML SOLN
INTRAVENOUS | Status: DC | PRN
  Administered 2014-09-11 (×2): 100 mL via INTRA_ARTERIAL

## 2014-09-11 MED ORDER — SODIUM CHLORIDE 0.9 % IV SOLN
0.2500 mg/kg/h | INTRAVENOUS | Status: DC
  Filled 2014-09-11: qty 250

## 2014-09-11 MED ORDER — MIDAZOLAM HCL 2 MG/2ML IJ SOLN
INTRAMUSCULAR | Status: DC | PRN
Start: 2014-09-11 — End: 2014-09-11
  Administered 2014-09-11: 2 mg via INTRAVENOUS

## 2014-09-11 MED ORDER — BIVALIRUDIN 250 MG IV SOLR
0.2500 mg/kg/h | INTRAVENOUS | Status: DC
  Filled 2014-09-11: qty 250

## 2014-09-11 SURGICAL SUPPLY — 24 items
BALLN EUPHORA RX 3.0X15 (BALLOONS) ×4
BALLN MINITREK RX 2.0X12 (BALLOONS) ×4
BALLN ~~LOC~~ EUPHORA RX 5.0X15 (BALLOONS) ×4
BALLOON EUPHORA RX 3.0X15 (BALLOONS) IMPLANT
BALLOON MINITREK RX 2.0X12 (BALLOONS) ×1 IMPLANT
BALLOON ~~LOC~~ EUPHORA RX 5.0X15 (BALLOONS) ×1 IMPLANT
CATH INFINITI 5FR ANG PIGTAIL (CATHETERS) ×4 IMPLANT
CATH INFINITI 5FR MULTPACK ANG (CATHETERS) IMPLANT
CATH OPTITORQUE TIG 4.0 5F (CATHETERS) ×4 IMPLANT
DEVICE RAD COMP TR BAND LRG (VASCULAR PRODUCTS) ×4 IMPLANT
GLIDESHEATH SLEND A-KIT 6F 22G (SHEATH) ×4 IMPLANT
GUIDE CATH RUNWAY 6FR FR4 (CATHETERS) ×2 IMPLANT
KIT ENCORE 26 ADVANTAGE (KITS) ×3 IMPLANT
KIT HEART LEFT (KITS) ×4 IMPLANT
PACK CARDIAC CATHETERIZATION (CUSTOM PROCEDURE TRAY) ×4 IMPLANT
SHEATH PINNACLE 5F 10CM (SHEATH) IMPLANT
STENT MULTI LINK ULTRA 5.0X18 (Permanent Stent) ×3 IMPLANT
STENT RESOLUTE INTEG 2.5X14 (Permanent Stent) ×3 IMPLANT
SYR MEDRAD MARK V 150ML (SYRINGE) ×4 IMPLANT
TRANSDUCER W/STOPCOCK (MISCELLANEOUS) ×4 IMPLANT
TUBING CIL FLEX 10 FLL-RA (TUBING) ×4 IMPLANT
WIRE EMERALD 3MM-J .035X150CM (WIRE) IMPLANT
WIRE HI TORQ BMW 190CM (WIRE) ×2 IMPLANT
WIRE SAFE-T 1.5MM-J .035X260CM (WIRE) ×4 IMPLANT

## 2014-09-11 NOTE — Progress Notes (Signed)
UR COMPLETED  

## 2014-09-11 NOTE — Interval H&P Note (Signed)
TIMI Score  Patient Information:  TIMI Score is 4   UA/NSTEMI and intermediate-risk features (e.g., TIMI score 3-4) for short-term risk of death or nonfatal MI  Revascularization of the presumed culprit artery   A (8)  Indication: 10; Score: 8  William Young, Piedad ClimesAVID W, M.D., M.S. Interventional Cardiologist   Pager # [phone]

## 2014-09-11 NOTE — H&P (View-Only) (Signed)
Patient ID: William Young, male   DOB: [birth year], 54 y.o.   MRN: [id number]    Primary Cardiologist:  Dietrich Pates  Subjective:  Denies SSCP, palpitations or Dyspnea Ready for cath on schedule 9:00  Objective:  Filed Vitals:   09/10/14 2050 09/11/14 0033 09/11/14 0500 09/11/14 0620  BP: 156/75 134/75  158/89  Pulse: 75 73  67  Temp: 98 F (36.7 C) 97.5 F (36.4 C)  97.7 F (36.5 C)  TempSrc: Oral Oral  Oral  Resp: Height:  (1.905 m)     Weight: 309 lb 1.4 oz (140.2 kg) 309 lb 1.4 oz (140.2 kg) 309 lb 1.4 oz (140.2 kg)   SpO2: 94% 97%  98%    Intake/Output from previous day:  Intake/Output Summary (Last 24 hours) at 09/11/14 4098 Last data filed at 09/11/14 0620  Gross per 24 hour  Intake 238.02 ml  Output   1300 ml  Net -1061.98 ml    Physical Exam: Affect appropriate Obese white male  HEENT: normal Neck supple with no adenopathy JVP normal no bruits no thyromegaly Lungs clear with no wheezing and good diaphragmatic motion Heart:  S1/S2 no murmur, no rub, gallop or click PMI normal Abdomen: benighn, BS positve, no tenderness, no AAA no bruit.  No HSM or HJR Distal pulses intact with no bruits No edema Neuro non-focal Skin warm and dry No muscular weakness   Lab Results: Basic Metabolic Panel:  Recent Labs  11/91/47 1145 09/11/14 0055  NA 136 135  K 5.0 3.7  CL 109 106  CO2 19* 21*  GLUCOSE 189* 207*  BUN 10 10  CREATININE 0.90 0.95  CALCIUM 8.5* 8.4*  MG 2.2  --    Liver Function Tests:  Recent Labs  09/10/14 1145  AST 32  ALT 23  ALKPHOS 95  BILITOT 1.2  PROT 6.3*  ALBUMIN 3.5   CBC:  Recent Labs  09/10/14 1145 09/11/14 0055  WBC 8.0 7.8  HGB 14.4 13.0  HCT 41.8 38.2*  MCV 90.5 91.2  PLT 144* 136*   Cardiac Enzymes:  Recent Labs  09/10/14 1522 09/10/14 2120 09/11/14 0544  TROPONINI 4.60* 12.14* 15.02*   Hemoglobin A1C:  Recent Labs  09/10/14 1145  HGBA1C 7.0*   Fasting Lipid Panel:  Recent  Labs  09/10/14 1522  CHOL 216*  HDL 36*  LDLCALC 159*  TRIG 105  CHOLHDL 6.0   Thyroid Function Tests:  Recent Labs  09/10/14 1522  TSH 1.661    Imaging: Dg Chest 2 View  09/10/2014   CLINICAL DATA:  Chest pain.  Shortness of breath .  EXAM: CHEST  2 VIEW  COMPARISON:  None.  FINDINGS: Mediastinum hilar structures normal. Lungs are clear. Heart size normal. No pleural effusion or pneumothorax cardiomegaly. No acute bony abnormality .  IMPRESSION: No acute cardiopulmonary disease.   Electronically Signed   By: Maisie Fus  Register   On: 09/10/2014 11:34    Cardiac Studies:  ECG:  SR T wave changes  3,F  No acute ST elevation    Telemetry: NSR no arrhythmia   Echo:   Medications:   . atorvastatin  80 mg Oral q1800  . carvedilol  6.25 mg Oral BID WC  . nicotine  21 mg Transdermal Daily  . nitroGLYCERIN  1 inch Topical 4 times per day  . [START ON 09/12/2014] pneumococcal 23 valent vaccine  0.5 mL Intramuscular Tomorrow-1000  . sodium chloride  3 mL Intravenous Q12H  .  sodium chloride  3 mL Intravenous Q12H     . sodium chloride 1 mL/kg/hr (09/11/14 0700)  . heparin 2,100 Units/hr (09/11/14 0400)    Assessment/Plan:  HTN:  Would add ACE/ARB post cath given hyperglycemia Smoking:  Discussed cessation written for transdermal patch ? In setting of SEMI Chol:  On statin  DM:  .Discussed low carb diet.  Target hemoglobin A1c is 6.5 or less. Nutrition consult may need glucophage post cath  SEMI:  For cath latter this morning  Troponin bump over 15  On heparin   Charlton HawsPeter Curby Carswell 09/11/2014, 8:14 AM

## 2014-09-11 NOTE — Progress Notes (Addendum)
Patient ID: William Young, male   DOB: 05/06/1961, 53 y.o.   MRN: [id number]    Primary Cardiologist:  William Young  Subjective:  Denies SSCP, palpitations or Dyspnea Ready for cath on schedule 9:00  Objective:  Filed Vitals:   09/10/14 2050 09/11/14 0033 09/11/14 0500 09/11/14 0620  BP: 156/75 134/75  158/89  Pulse: 75 73  67  Temp: 98 F (36.7 C) 97.5 F (36.4 C)  97.7 F (36.5 C)  TempSrc: Oral Oral  Oral  Resp: 15 18  20  Height: 6' 3" (1.905 m)     Weight: 309 lb 1.4 oz (140.2 kg) 309 lb 1.4 oz (140.2 kg) 309 lb 1.4 oz (140.2 kg)   SpO2: 94% 97%  98%    Intake/Output from previous day:  Intake/Output Summary (Last 24 hours) at 09/11/14 0814 Last data filed at 09/11/14 0620  Gross per 24 hour  Intake 238.02 ml  Output   1300 ml  Net -1061.98 ml    Physical Exam: Affect appropriate Obese white male  HEENT: normal Neck supple with no adenopathy JVP normal no bruits no thyromegaly Lungs clear with no wheezing and good diaphragmatic motion Heart:  S1/S2 no murmur, no rub, gallop or click PMI normal Abdomen: benighn, BS positve, no tenderness, no AAA no bruit.  No HSM or HJR Distal pulses intact with no bruits No edema Neuro non-focal Skin warm and dry No muscular weakness   Lab Results: Basic Metabolic Panel:  Recent Labs  09/10/14 1145 09/11/14 0055  NA 136 135  K 5.0 3.7  CL 109 106  CO2 19* 21*  GLUCOSE 189* 207*  BUN 10 10  CREATININE 0.90 0.95  CALCIUM 8.5* 8.4*  MG 2.2  --    Liver Function Tests:  Recent Labs  09/10/14 1145  AST 32  ALT 23  ALKPHOS 95  BILITOT 1.2  PROT 6.3*  ALBUMIN 3.5   CBC:  Recent Labs  09/10/14 1145 09/11/14 0055  WBC 8.0 7.8  HGB 14.4 13.0  HCT 41.8 38.2*  MCV 90.5 91.2  PLT 144* 136*   Cardiac Enzymes:  Recent Labs  09/10/14 1522 09/10/14 2120 09/11/14 0544  TROPONINI 4.60* 12.14* 15.02*   Hemoglobin A1C:  Recent Labs  09/10/14 1145  HGBA1C 7.0*   Fasting Lipid Panel:  Recent  Labs  09/10/14 1522  CHOL 216*  HDL 36*  LDLCALC 159*  TRIG 105  CHOLHDL 6.0   Thyroid Function Tests:  Recent Labs  09/10/14 1522  TSH 1.661    Imaging: Dg Chest 2 View  09/10/2014   CLINICAL DATA:  Chest pain.  Shortness of breath .  EXAM: CHEST  2 VIEW  COMPARISON:  None.  FINDINGS: Mediastinum hilar structures normal. Lungs are clear. Heart size normal. No pleural effusion or pneumothorax cardiomegaly. No acute bony abnormality .  IMPRESSION: No acute cardiopulmonary disease.   Electronically Signed   By: William  Young   On: 09/10/2014 11:34    Cardiac Studies:  ECG:  SR T wave changes  3,F  No acute ST elevation    Telemetry: NSR no arrhythmia   Echo:   Medications:   . atorvastatin  80 mg Oral q1800  . carvedilol  6.25 mg Oral BID WC  . nicotine  21 mg Transdermal Daily  . nitroGLYCERIN  1 inch Topical 4 times per day  . [START ON 09/12/2014] pneumococcal 23 valent vaccine  0.5 mL Intramuscular Tomorrow-1000  . sodium chloride  3 mL Intravenous Q12H  .   sodium chloride  3 mL Intravenous Q12H     . sodium chloride 1 mL/kg/hr (09/11/14 0700)  . heparin 2,100 Units/hr (09/11/14 0400)    Assessment/Plan:  HTN:  Would add ACE/ARB post cath given hyperglycemia Smoking:  Discussed cessation written for transdermal patch ? In setting of SEMI Chol:  On statin  DM:  .Discussed low carb diet.  Target hemoglobin A1c is 6.5 or less. Nutrition consult may need glucophage post cath  SEMI:  For cath latter this morning  Troponin bump over 15  On heparin   William Young 09/11/2014, 8:14 AM     

## 2014-09-11 NOTE — Interval H&P Note (Signed)
History and Physical Interval Note:  09/11/2014 9:32 AM  Darrin NipperWilliam Cordts  has presented today for surgery, with the diagnosis of NON-STEMI.  The various methods of treatment have been discussed with the patient and family. After consideration of risks, benefits and other options for treatment, the patient has consented to  Procedure(s): Left Heart Cath and Coronary Angiography (N/A) and possible Percutaneous Coronary Intervention  as a surgical intervention .    The patient's history has been reviewed, patient examined, no change in status, stable for surgery.  I have reviewed the patient's chart and labs.  Questions were answered to the patient's satisfaction.    Cath Lab Visit (complete for each Cath Lab visit)  Clinical Evaluation Leading to the Procedure:   ACS: Yes.    Non-ACS:    Anginal Classification: CCS IV  Anti-ischemic medical therapy: Minimal Therapy (1 class of medications)  Non-Invasive Test Results: No non-invasive testing performed  Prior CABG: No previous CABG  Jerri Hargadon W

## 2014-09-11 NOTE — Progress Notes (Signed)
TR BAND REMOVAL  LOCATION:    right radial  DEFLATED PER PROTOCOL:    Yes.    TIME BAND OFF / DRESSING APPLIED:    1645   SITE UPON ARRIVAL:    Level 0  SITE AFTER BAND REMOVAL:    Level 0  REVERSE ALLEN'S TEST:     positive  CIRCULATION SENSATION AND MOVEMENT:    Within Normal Limits   Yes.    COMMENTS:   Tolerated procedure well 

## 2014-09-11 NOTE — Care Management Note (Signed)
Case Management Note  Patient Details  Name: William NipperWilliam Young MRN: [id number] Date of Birth: 06/24/1960  Subjective/Objective:           PTA  from home with family admitted with UA/NSTEMI.        Action/Plan: Return to home when medically stable. CM to f/u with disposition needs.  Expected Discharge Date:  09/12/14               Expected Discharge Plan:  Home/Self Care  In-House Referral:     Discharge planning Services  CM Consult  Post Acute Care Choice:    Choice offered to:     DME Arranged:    DME Agency:     HH Arranged:    HH Agency:     Status of Service:  In process, will continue to follow  Medicare Important Message Given:    Date Medicare IM Given:    Medicare IM give by:    Date Additional Medicare IM Given:    Additional Medicare Important Message give by:     If discussed at Long Length of Stay Meetings, dates discussed:    Additional Comments:  Pt with no insurance and no PCP, [facility] called to establish PCP and medication. Appointment scheduled for 09/14/2014 @ 1530. CM spoke with pt regarding Brilinta, pamphlet with 30 day free card given to pt. No other needs identified.   Gae GallopCole, [name], RN 09/11/2014, 2:02 PM

## 2014-09-11 NOTE — Progress Notes (Signed)
ANTICOAGULATION CONSULT NOTE  Pharmacy Consult for heparin Indication: NSTEMI  Allergies  Allergen Reactions  . Aspirin Anaphylaxis, Swelling and Rash    Throat closes   . Ibuprofen Anaphylaxis, Swelling and Rash    Patient Measurements: Height: 6\' 3"  (190.5 cm) Weight: (!) 309 lb 1.4 oz (140.2 kg) IBW/kg (Calculated) : 84.5 Heparin Dosing Weight: 114 kg  Vital Signs: Temp: 97.5 F (36.4 C) (05/03 0033) Temp Source: Oral (05/03 0033) BP: 134/75 mmHg (05/03 0033) Pulse Rate: 73 (05/03 0033)  Labs:  Recent Labs  09/10/14 1145 09/10/14 1522 09/10/14 2120 09/11/14 0055  HGB 14.4  --   --  13.0  HCT 41.8  --   --  38.2*  PLT 144*  --   --  136*  LABPROT  --  14.0  --   --   INR  --  1.07  --   --   HEPARINUNFRC  --   --   --  <0.10*  CREATININE 0.90  --   --  0.95  TROPONINI  --  4.60* 12.14*  --     Estimated Creatinine Clearance: 135.8 mL/min (by C-G formula based on Cr of 0.95).  Assessment: 54 y.o. male with chest pain for heparin  Goal of Therapy:  Heparin level 0.3-0.7 units/ml Monitor platelets by anticoagulation protocol: Yes   Plan:  Heparin 3000 units IV bolus, then increase heparin 2100 units/hr F/U after cath   Geannie RisenGreg Brielyn Bosak, PharmD, BCPS  09/11/2014 2:45 AM

## 2014-09-11 NOTE — Progress Notes (Signed)
Inpatient Diabetes Program Recommendations  AACE/ADA: New Consensus Statement on Inpatient Glycemic Control (2013)  Target Ranges:  Prepandial:   less than 140 mg/dL      Peak postprandial:   less than 180 mg/dL (1-2 hours)      Critically ill patients:  140 - 180 mg/dL   Noted documented note that pt has no PCP and has not had a physical since high school. With diagnostic HgbA1C for presence of diabetes, pt would benefit from some basic dm education per dietician consult, dm ed'l videos per system network videos, ed manual. If this matter has been discussed with pt, would request in-pt diabetes survival skill ed. And add carbohydrate modified to diet orders.. Inpatient Diabetes Program Recommendations Correction (SSI): Please check cbg's tidwc and HS and use sensitive correction tidw and HS scale HgbA1C: A1C result this admission is 7.0% (diagnostic of dm)  Pt has no PCP listed-will need be followed Outpatient Referral: Will need education.  Thank you Lenor CoffinAnn Joud Ingwersen, RN, MSN, CDE  Diabetes Inpatient Program Office: [phone] Pager: [phone] 8:00 am to 5:00 pm

## 2014-09-12 ENCOUNTER — Encounter (HOSPITAL_COMMUNITY): Payer: Self-pay | Admitting: Cardiology

## 2014-09-12 LAB — BASIC METABOLIC PANEL
Anion gap: 9 (ref 5–15)
BUN: 7 mg/dL (ref 6–20)
CALCIUM: 8.7 mg/dL — AB (ref 8.9–10.3)
CO2: 23 mmol/L (ref 22–32)
Chloride: 105 mmol/L (ref 101–111)
Creatinine, Ser: 0.8 mg/dL (ref 0.61–1.24)
GFR calc Af Amer: >60 mL/min (ref >60)
GFR calc non Af Amer: >60 mL/min (ref >60)
GLUCOSE: 157 mg/dL — AB (ref 70–99)
POTASSIUM: 3.4 mmol/L — AB (ref 3.5–5.1)
SODIUM: 137 mmol/L (ref 135–145)

## 2014-09-12 LAB — CBC
HCT: 37.6 % — ABNORMAL LOW (ref 39.0–52.0)
Hemoglobin: 12.9 g/dL — ABNORMAL LOW (ref 13.0–17.0)
MCH: 31.1 pg (ref 26.0–34.0)
MCHC: 34.3 g/dL (ref 30.0–36.0)
MCV: 90.6 fL (ref 78.0–100.0)
PLATELETS: 148 10*3/uL — AB (ref 150–400)
RBC: 4.15 MIL/uL — AB (ref 4.22–5.81)
RDW: 13.5 % (ref 11.5–15.5)
WBC: 7 10*3/uL (ref 4.0–10.5)

## 2014-09-12 MED ORDER — NITROGLYCERIN 0.4 MG SL SUBL: 0.4000 mg | SUBLINGUAL_TABLET | SUBLINGUAL | Status: DC | PRN

## 2014-09-12 MED ORDER — POTASSIUM CHLORIDE CRYS ER 20 MEQ PO TBCR
40.0000 meq | EXTENDED_RELEASE_TABLET | Freq: Once | ORAL | Status: AC
  Administered 2014-09-12: 10:00:00 40 meq via ORAL
  Filled 2014-09-12: qty 2

## 2014-09-12 MED ORDER — TICAGRELOR 90 MG PO TABS: 90.0000 mg | ORAL_TABLET | Freq: Two times a day (BID) | ORAL | Status: DC

## 2014-09-12 MED ORDER — CARVEDILOL 12.5 MG PO TABS
12.5000 mg | ORAL_TABLET | Freq: Two times a day (BID) | ORAL | Status: DC
  Administered 2014-09-12: 10:00:00 12.5 mg via ORAL
  Filled 2014-09-12: qty 1

## 2014-09-12 MED ORDER — CARVEDILOL 12.5 MG PO TABS: 12.5000 mg | ORAL_TABLET | Freq: Two times a day (BID) | ORAL | Status: DC

## 2014-09-12 MED ORDER — ATORVASTATIN CALCIUM 80 MG PO TABS: 80.0000 mg | ORAL_TABLET | Freq: Every day | ORAL | Status: DC

## 2014-09-12 NOTE — Discharge Summary (Signed)
Physician Discharge Summary     Cardiologist:  Tenny Crawoss  Patient ID: William NipperWilliam Ketron MRN: [id number] DOB/AGE: 54/12/1960 54 y.o.  Admit date: 09/10/2014 Discharge date: 09/12/2014  Admission Diagnoses:  NSTEMI  Discharge Diagnoses:  Principal Problem:   NSTEMI, initial episode of care Active Problems:   Hypertension, uncontrolled   Hyperglycemia   Tobacco use   GERD (gastroesophageal reflux disease)   Coronary artery disease involving native coronary artery of native heart with unstable angina pectoris   Morbid obesity   Discharged Condition: stable  Hospital Course:   William NipperWilliam Zalenski is a 54 y.o. male with no history of CAD. He has not had a physical since he got out of high school. The day of admission he woke with chest pain at about 6:45 AM. It was different from his usual heartburn. It was a pressure and reached an 8/10. It went through to his back with a sharp pain. He was slightly short of breath but had no nausea, vomiting, or diaphoresis. He took Tylenol and Zantac with minimal relief. When his symptoms had not resolved, and he developed left arm numbness, his fianc brought him to the emergency room.  In the emergency room, he received nitroglycerin ointment, morphine, hydrocodone and heparin. With nitroglycerin ointment, his symptoms resolved and currently his pain level is a 0/10. He has never had any pain like this before. He is busy and moderately active with his job, without ever having these symptoms. He does not exercise. He has no recent illnesses or injuries. He denies any history of melena. He has occasional reflux symptoms, mostly associated with spicy foods. He initially thought it was reflux this morning, because he had spaghetti last night. However, the quality of the pain was different, it was worse than usual, and it did not resolve with antacids as he usually does.  He was admitted on IV heparin.  Beta blocker and statin.  No ASA due to allergy.  He underwent a left  heart cath which revealed severe 2 site CAD of the prox RCA and RPL1 (noted as RPL3) - both sites treated with PCI: BMS prox RCA, DES RPL.  He was started on Brilinta.  Coreg was increased to 12.5 bid for better BP control and because he was having short NSVT.  SCr Stable and WNL.  He was seen by the diabetes [facility].  Potassium replaced.  Tobacco cessation discussed.  The patient ambulated 83200ft with CR with no CP the morning of discharge.  CRP II discussed.  The patient was seen by Dr. Eden EmmsNishan who felt he was stable for DC home.   Consults:  Diabetes [facility]  Significant Diagnostic Studies:   Cardiac Cath  Severe 2 site CAD of the prox RCA and RPL1 (noted as RPL3) - both sites treated with PCI: BMS prox RCA, DES RPL  Prox RCA lesion, 90% stenosed. There is a 0% residual stenosis post intervention.  A bare metal stent was placed. MultiLink Vision BMS 5.0 mm x 18 mm (5.2 mm)  A drug-eluting stent was placed. Resolute DES 2.5 mm x 14 mm (2.8 mm)  Normal LV Function & LVEDP  3rd RPLB lesion, 80% stenosed. There is a 0% residual stenosis post intervention.  NSTEMI - CAD s/p PCI to 2 Lesions in the RCA system ASA Anaphylaxis history  Recommend: Standard post Radial PCI care with TR Band removal With ASA allergy - limited to Brilinta alone --> recommend 1 year minimum (would convert to Plavix at 6-12 months for continued coverage) Continue  aggressive Cardiac Risk Factor modification - BP, lipid & glycemic control along with smoking cessation counseling & wgt loss. Care Management Consult for Brilinta  Treatments: See above  Discharge Exam: Blood pressure 160/92, pulse 76, temperature 98 F (36.7 C), temperature source Oral, resp. rate 18, height 6\' 3"  (1.905 m), weight 302 lb 4 oz (137.1 kg), SpO2 97 %.   Disposition: 01-Home or Self Care      Discharge Instructions    Amb Referral to Cardiac Rehabilitation    Complete by:  As directed   Congestive Heart Failure: If diagnosis is  Heart Failure, patient MUST meet each of the CMS criteria: 1. Left Ventricular Ejection Fraction </= 35% 2. NYHA class II-IV symptoms despite being on optimal heart failure therapy for at least 6 weeks. 3. Stable = have not had a recent (<6 weeks) or planned (<6 months) major cardiovascular hospitalization or procedure  Program Details: - Physician supervised classes - 1-3 classes per week over a 12-18 week period, generally for a total of 36 sessions  Physician Certification: I certify that the above Cardiac Rehabilitation treatment is medically necessary and is medically approved by me for treatment of this patient. The patient is willing and cooperative, able to ambulate and medically stable to participate in exercise rehabilitation. The participant's progress and Individualized Treatment Plan will be reviewed by the Medical Director, Cardiac Rehab staff and as indicated by the Referring/Ordering Physician.  Diagnosis:  Myocardial Infarction     Diet - low sodium heart healthy    Complete by:  As directed      Increase activity slowly    Complete by:  As directed             Medication List    STOP taking these medications        predniSONE 10 MG tablet  Commonly known as:  DELTASONE      TAKE these medications        acetaminophen 500 MG tablet  Commonly known as:  TYLENOL  Take 1,000 mg by mouth every 6 (six) hours as needed for mild pain.     atorvastatin 80 MG tablet  Commonly known as:  LIPITOR  Take 1 tablet (80 mg total) by mouth daily at 6 PM.     carvedilol 12.5 MG tablet  Commonly known as:  COREG  Take 1 tablet (12.5 mg total) by mouth 2 (two) times daily with a meal.     HYDROcodone-acetaminophen 5-325 MG per tablet  Commonly known as:  NORCO/VICODIN  Take 1 tablet by mouth every 6 (six) hours as needed for moderate pain.     nitroGLYCERIN 0.4 MG SL tablet  Commonly known as:  NITROSTAT  Place 1 tablet (0.4 mg total) under the tongue every 5 (five)  minutes x 3 doses as needed for chest pain.     ranitidine 150 MG tablet  Commonly known as:  ZANTAC  Take 150 mg by mouth once.     ticagrelor 90 MG Tabs tablet  Commonly known as:  BRILINTA  Take 1 tablet (90 mg total) by mouth 2 (two) times daily.       Follow-up Information    [facility]     On 09/14/2014.   Why:  [facility] and medication on  09/14/2014 @ 3:30 pm,  please arrive by 3:15 pm   Contact information:   [address] [facility]  [place]  [id number] [phone]      Follow up with [name], PA-C On 09/28/2014.   Specialty:  Physician Assistant   Why:  12:10PM   Contact information:   [address] [place] [address] [phone]      Greater than 30 minutes was spent completing the patient's discharge.      SignedWilburt Finlay, PAC 09/12/2014, 9:46 AM

## 2014-09-12 NOTE — Progress Notes (Signed)
Subjective: No CP or SOB  Objective: Vital signs in last 24 hours: Temp:  [97.5 F (36.4 C)-98.1 F (36.7 C)] 98 F (36.7 C) (05/04 0509) Pulse Rate:  [50-74] 63 (05/04 0509) Resp:  [10-22] 19 (05/04 0509) BP: (100-176)/(61-99) 140/83 mmHg (05/04 0509) SpO2:  [0 %-99 %] 97 % (05/04 0509) Weight:  [302 lb 4 oz (137.1 kg)-307 lb 8.7 oz (139.5 kg)] 302 lb 4 oz (137.1 kg) (05/04 0509) Last BM Date: 09/10/14  Intake/Output from previous day: 05/03 0701 - 05/04 0700 In: 3765.7 [P.O.:1200; I.V.:2565.7] Out: 4452 [Urine:4450; Stool:2] Intake/Output this shift:    Medications Scheduled Meds: . atorvastatin  80 mg Oral q1800  . carvedilol  6.25 mg Oral BID WC  . nicotine  21 mg Transdermal Daily  . nitroGLYCERIN  1 inch Topical 4 times per day  . pneumococcal 23 valent vaccine  0.5 mL Intramuscular Tomorrow-1000  . sodium chloride  3 mL Intravenous Q12H  . ticagrelor  90 mg Oral BID   Continuous Infusions:  PRN Meds:.sodium chloride, acetaminophen, ALPRAZolam, morphine injection, nitroGLYCERIN, ondansetron (ZOFRAN) IV, sodium chloride, zolpidem  PE: General appearance: alert, cooperative and no distress Lungs: clear to auscultation bilaterally Heart: regular rate and rhythm, S1, S2 normal, no murmur, click, rub or gallop Extremities: No LEE Pulses: 2+ and symmetric Skin: Warm and dry.  Right radial site stable Neurologic: Grossly normal  Lab Results:   Recent Labs  09/10/14 1145 09/11/14 0055 09/12/14 0354  WBC 8.0 7.8 7.0  HGB 14.4 13.0 12.9*  HCT 41.8 38.2* 37.6*  PLT 144* 136* 148*   BMET  Recent Labs  09/10/14 1145 09/11/14 0055 09/12/14 0354  NA 136 135 137  K 5.0 3.7 3.4*  CL 109 106 105  CO2 19* 21* 23  GLUCOSE 189* 207* 157*  BUN 10 10 7   CREATININE 0.90 0.95 0.80  CALCIUM 8.5* 8.4* 8.7*   PT/INR  Recent Labs  09/10/14 1522  LABPROT 14.0  INR 1.07   Cholesterol  Recent Labs  09/10/14 1522  CHOL 216*   Lipid Panel       Component Value Date/Time   CHOL 216* 09/10/2014 1522   TRIG 105 09/10/2014 1522   HDL 36* 09/10/2014 1522   CHOLHDL 6.0 09/10/2014 1522   VLDL 21 09/10/2014 1522   LDLCALC 159* 09/10/2014 1522    Assessment/Plan  Principal Problem:   NSTEMI, initial episode of care Active Problems:   Hypertension, uncontrolled   Hyperglycemia   Tobacco use   GERD (gastroesophageal reflux disease)   Coronary artery disease involving native coronary artery of native heart with unstable angina pectoris   Morbid obesity   Hypokalemia   NSVT    SP left heart cath revealing:   Severe 2 site CAD of the prox RCA and RPL1 (noted as RPL3) - both sites treated with PCI: BMS prox RCA, DES RPL  Prox RCA lesion, 90% stenosed. There is a 0% residual stenosis post intervention.  A bare metal stent was placed. MultiLink Vision BMS 5.0 mm x 18 mm (5.2 mm)  A drug-eluting stent was placed. Resolute DES 2.5 mm x 14 mm (2.8 mm)  Normal LV Function & LVEDP  3rd RPLB lesion, 80% stenosed. There is a 0% residual stenosis post intervention.  ASA, Brilinta, coreg 6.25, .  SCr stable and WNL.  Will increase coreg to 12.5 BID for BP and NSVT(8 beat last night).  We discussed low carb diet.      LOS: 2 days  Wilburt FinlayHAGER, BRYAN PA-C 09/12/2014 8:06 AM  Patient examined chart reviewed.  Right radial A agree with increase in beta blocker Needs primary care MD  D/C today after ambulation  Charlton HawsPeter Mikaele Stecher

## 2014-09-12 NOTE — Progress Notes (Signed)
CARDIAC REHAB PHASE I   PRE:  Rate/Rhythm: 86 SR  BP:  Supine:   Sitting: 160/92  Standing:    SaO2:   MODE:  Ambulation: 800 ft   POST:  Rate/Rhythm: 88 SR  BP:  Supine:   Sitting: 165/89  Standing:    SaO2:  [id number] Pt walked 800 ft with steady gait. No CP. Tolerated well. MI education completed with pt who voiced understanding. Since HGA1C at 7 discussed carb counting and heart healthy diet. Gave pt fake cigarette and discussed smoking cessation and gave handouts. Pt plans to quit cold [place]. Discussed CRP 2 and pt agreed to consider. Gave financial aid form to fill out if interested in program. Will send referral to [place]. Pt stated ready to make changes. Stressed importance of brililnta with stents.   Luetta Nuttingharlene Mekayla Soman, RN BSN  09/12/2014 9:12 AM

## 2014-09-14 ENCOUNTER — Ambulatory Visit: Payer: Self-pay | Attending: Family Medicine | Admitting: Family Medicine

## 2014-09-14 ENCOUNTER — Encounter: Payer: Self-pay | Admitting: Family Medicine

## 2014-09-14 VITALS — BP 120/85 | HR 61 | Temp 97.6°F | Resp 18 | Ht 76.0 in | Wt 305.0 lb

## 2014-09-14 DIAGNOSIS — R739 Hyperglycemia, unspecified: Secondary | ICD-10-CM | POA: Insufficient documentation

## 2014-09-14 DIAGNOSIS — I5042 Chronic combined systolic (congestive) and diastolic (congestive) heart failure: Secondary | ICD-10-CM | POA: Insufficient documentation

## 2014-09-14 DIAGNOSIS — Z6837 Body mass index (BMI) 37.0-37.9, adult: Secondary | ICD-10-CM | POA: Insufficient documentation

## 2014-09-14 DIAGNOSIS — Z87891 Personal history of nicotine dependence: Secondary | ICD-10-CM | POA: Insufficient documentation

## 2014-09-14 DIAGNOSIS — E119 Type 2 diabetes mellitus without complications: Secondary | ICD-10-CM

## 2014-09-14 DIAGNOSIS — K219 Gastro-esophageal reflux disease without esophagitis: Secondary | ICD-10-CM | POA: Insufficient documentation

## 2014-09-14 DIAGNOSIS — G4733 Obstructive sleep apnea (adult) (pediatric): Secondary | ICD-10-CM | POA: Insufficient documentation

## 2014-09-14 DIAGNOSIS — I25110 Atherosclerotic heart disease of native coronary artery with unstable angina pectoris: Secondary | ICD-10-CM | POA: Insufficient documentation

## 2014-09-14 LAB — GLUCOSE, POCT (MANUAL RESULT ENTRY): POC GLUCOSE: 163 mg/dL — AB (ref 70–99)

## 2014-09-14 MED ORDER — RANITIDINE HCL 150 MG PO TABS: 150.0000 mg | ORAL_TABLET | Freq: Two times a day (BID) | ORAL | Status: DC

## 2014-09-14 NOTE — Progress Notes (Signed)
Patient hospitalized for heart attack, last Monday-Wednesday. Patient reports feeling better. Quit smoking, has been eating healthy. Patient had 2 stents placed.

## 2014-09-14 NOTE — Progress Notes (Addendum)
Subjective:    Patient ID: William Young, male    DOB: December 29, 1960, 54 y.o.   MRN: [id number]  HPI   Admit date: 09/10/14 Discharge date: 09/12/14  William Young is a 54 year old male with. No previous past medical history who presented to the ED with chest pain radiating to the back, shortness of breath. On presentation in the ED  he received nitroglycerin ointment, morphine, hydrocodone and heparin. With nitroglycerin ointment, his symptoms resolved. His troponins were elevated at 4.6, 12.14, 15.02. EKG revealed sinus rhythm, inverted T waves in lead III and aVL. He was admitted on IV heparin. Beta blocker and statin. No ASA due to allergy. He underwent a left heart cath which revealed severe 2 site CAD of the prox RCA and RPL1 (noted as RPL3) - both sites treated with PCI: BMS prox RCA, DES RPL. He was started on Brilinta. Coreg was increased to 12.5 bid for better BP control and because he was having short NSVT.  During his stay he was found to have a Hba1c of 7.0 and was counseled on diet.  His condition stabilized and he was discharged.   Interval history:  He reports doing well and has made a lot of changes to his lifestyle which include a DASH diet, exercise and he is reducing portion sizes. He complains that he has multiple nighttime awakenings, snores at night and use the CPAP machine during his admission and would like to be tested for sleep apnea. He complains of reflux and has used Zantac on a few occassions with some relief.  Past Medical History  Diagnosis Date  . GERD (gastroesophageal reflux disease)   . Tobacco use   . Hyperglycemia   . Heart attack 09/2014    Past Surgical History  Procedure Laterality Date  . None    . Cardiac catheterization N/A 09/11/2014    Procedure: Left Heart Cath and Coronary Angiography;  Surgeon: Marykay Lex, MD;  Location: [facility] INVASIVE CV LAB CUPID;  Service: Cardiovascular;  Laterality: N/A;  . Cardiac catheterization  09/11/2014   Procedure: Coronary Stent Intervention;  Surgeon: Marykay Lex, MD;  Location: [facility] INVASIVE CV LAB CUPID;  Service: Cardiovascular;;    Family History  Problem Relation Age of Onset  . Heart attack Maternal Grandfather     History   Social History  . Marital Status: Widowed    Spouse Name: N/A  . Number of Children: N/A  . Years of Education: N/A   Occupational History  . 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 Smoking status: Former Smoker -- 1.00 packs/day for 35 years    Types: Cigarettes    Quit date: 09/10/2014  . Smokeless tobacco: Never Used  . Alcohol Use: 0.0 oz/week    0 Standard drinks or equivalent per week     Comment: 64 oz beer most nights, stopped drinking 09/10/14  . Drug Use: No  . Sexual Activity: Yes   Other Topics Concern  . Not on file   Social History Narrative   Lives with fiance and 2 daughters. Neither his parents nor siblings have any cardiac issues that he knows of.    Allergies  Allergen Reactions  . Aspirin Anaphylaxis, Swelling and Rash    Throat closes   . Ibuprofen Anaphylaxis, Swelling and Rash    Current Outpatient Prescriptions on File Prior to Visit  Medication Sig Dispense Refill  . acetaminophen (TYLENOL) 500 MG tablet Take 1,000 mg by mouth every  6 (six) hours as needed for mild pain.    Marland Kitchen. atorvastatin (LIPITOR) 80 MG tablet Take 1 tablet (80 mg total) by mouth daily at 6 PM. 30 tablet 11  . carvedilol (COREG) 12.5 MG tablet Take 1 tablet (12.5 mg total) by mouth 2 (two) times daily with a meal. 60 tablet 11  . nitroGLYCERIN (NITROSTAT) 0.4 MG SL tablet Place 1 tablet (0.4 mg total) under the tongue every 5 (five) minutes x 3 doses as needed for chest pain. 25 tablet 12  . ticagrelor (BRILINTA) 90 MG TABS tablet Take 1 tablet (90 mg total) by mouth 2 (two) times daily. 60 tablet 11  . HYDROcodone-acetaminophen (NORCO/VICODIN) 5-325 MG per tablet Take 1 tablet by mouth every 6 (six) hours as needed  for moderate pain. (Patient not taking: Reported on 09/10/2014) 12 tablet 0   No current facility-administered medications on file prior to visit.     Review of Systems  Constitutional: Negative for activity change and appetite change.  HENT: Negative for sinus pressure and sore throat.   Eyes: Negative for visual disturbance.  Respiratory: Negative for chest tightness and shortness of breath.   Cardiovascular: Negative for chest pain and palpitations.  Gastrointestinal: Negative for abdominal pain and abdominal distention.       Admits reflux  Endocrine: Negative for cold intolerance, heat intolerance and polyphagia.  Genitourinary: Negative for dysuria, frequency and difficulty urinating.  Musculoskeletal: Negative for back pain, joint swelling and arthralgias.  Skin: Negative for color change.  Neurological: Negative for dizziness, tremors and weakness.  Psychiatric/Behavioral: Negative for suicidal ideas and behavioral problems.         Objective: Filed Vitals:   09/14/14 1535  BP: 120/85  Pulse: 61  Temp: 97.6 F (36.4 C)  Resp: 18      Physical Exam  Constitutional: He is oriented to person, place, and time. He appears well-developed and well-nourished.  Obese  HENT:  Head: Normocephalic and atraumatic.  Right Ear: External ear normal.  Left Ear: External ear normal.  Eyes: Conjunctivae and EOM are normal. Pupils are equal, round, and reactive to light.  Neck: Normal range of motion. Neck supple. No tracheal deviation present.  Cardiovascular: Normal rate, regular rhythm and normal heart sounds.   No murmur heard. Pulmonary/Chest: Effort normal and breath sounds normal. No respiratory distress. He has no wheezes. He exhibits no tenderness.  Abdominal: Soft. Bowel sounds are normal. He exhibits no mass. There is no tenderness.  Musculoskeletal: Normal range of motion. He exhibits no edema or tenderness.  Neurological: He is alert and oriented to person, place, and  time.  Skin: Skin is warm and dry.  Psychiatric: He has a normal mood and affect.           Assessment & Plan:   54 year old male recently managed for coronary artery disease status post stent placement, hypertension , newly diagnosed diabetes currently doing well on medications.     Coronary artery disease status post stent :  Stable.  Advised to follow-up with cardiology appointment later this month.    Obesity: Reduce portion sizes and increase physical activity once cleared by cardiology.   GERD:  Refilled Zantac.    Obstructive sleep apnea : Referred for sleep study and I have explained to patient and his family the logistics involved given he has no health coverage and  They have  agreed to proceed with the test and will discuss payment options with the DME company  when time comes.  Type  2 diabetes mellitus:   New diagnosis with A1c of 7. Patient was unaware and so is overwhelmed with this ; I will see him back next week and we will Discuss this diagnosis in detail as well as management options.       Disclaimer: This note was dictated with voice recognition software. Similar sounding words can inadvertently be transcribed and this note may contain transcription errors which may not have been corrected upon publication of note.

## 2014-09-14 NOTE — Patient Instructions (Signed)

## 2014-09-16 NOTE — Progress Notes (Signed)
Quick Note:  Labs addressed at office as well as medications and patient was made aware. ______ 

## 2014-09-21 ENCOUNTER — Ambulatory Visit: Payer: Self-pay | Admitting: Family Medicine

## 2014-09-27 ENCOUNTER — Encounter: Payer: Self-pay | Admitting: Family Medicine

## 2014-09-27 ENCOUNTER — Ambulatory Visit: Payer: Self-pay | Attending: Family Medicine | Admitting: Family Medicine

## 2014-09-27 VITALS — BP 137/84 | HR 70 | Temp 98.2°F | Resp 18 | Ht 75.0 in | Wt 301.0 lb

## 2014-09-27 DIAGNOSIS — I10 Essential (primary) hypertension: Secondary | ICD-10-CM | POA: Insufficient documentation

## 2014-09-27 DIAGNOSIS — I25110 Atherosclerotic heart disease of native coronary artery with unstable angina pectoris: Secondary | ICD-10-CM

## 2014-09-27 DIAGNOSIS — Z955 Presence of coronary angioplasty implant and graft: Secondary | ICD-10-CM | POA: Insufficient documentation

## 2014-09-27 DIAGNOSIS — I2510 Atherosclerotic heart disease of native coronary artery without angina pectoris: Secondary | ICD-10-CM | POA: Insufficient documentation

## 2014-09-27 DIAGNOSIS — I509 Heart failure, unspecified: Secondary | ICD-10-CM | POA: Insufficient documentation

## 2014-09-27 DIAGNOSIS — Z87891 Personal history of nicotine dependence: Secondary | ICD-10-CM | POA: Insufficient documentation

## 2014-09-27 DIAGNOSIS — G4733 Obstructive sleep apnea (adult) (pediatric): Secondary | ICD-10-CM | POA: Insufficient documentation

## 2014-09-27 DIAGNOSIS — K219 Gastro-esophageal reflux disease without esophagitis: Secondary | ICD-10-CM | POA: Insufficient documentation

## 2014-09-27 DIAGNOSIS — E119 Type 2 diabetes mellitus without complications: Secondary | ICD-10-CM | POA: Insufficient documentation

## 2014-09-27 DIAGNOSIS — Z7902 Long term (current) use of antithrombotics/antiplatelets: Secondary | ICD-10-CM | POA: Insufficient documentation

## 2014-09-27 LAB — GLUCOSE, POCT (MANUAL RESULT ENTRY): POC Glucose: 223 mg/dl — AB (ref 70–99)

## 2014-09-27 MED ORDER — GLUCOSE BLOOD VI STRP: ORAL_STRIP | Status: DC

## 2014-09-27 MED ORDER — LANCETS 30G MISC: 1.0000 | Freq: Two times a day (BID) | Status: DC

## 2014-09-27 MED ORDER — TRUERESULT BLOOD GLUCOSE W/DEVICE KIT
1.0000 | PACK | Freq: Two times a day (BID) | Status: DC
Start: 2014-09-27 — End: 2015-11-01

## 2014-09-27 MED ORDER — LISINOPRIL 5 MG PO TABS: 5.0000 mg | ORAL_TABLET | Freq: Every day | ORAL | Status: DC

## 2014-09-27 MED ORDER — GLIPIZIDE 5 MG PO TABS: 2.5000 mg | ORAL_TABLET | Freq: Two times a day (BID) | ORAL | Status: DC

## 2014-09-27 NOTE — Progress Notes (Addendum)
Subjective:    Patient ID: William Young, male    DOB: 07/19/1960, 54 y.o.   MRN: [id number]  HPI  William NipperWilliam Ewart is a 54 year old male with a history of 2 vessel CAD s/p DES placement in 09/2014 and is currently on Brilinta, hypertension, newly diagnosed diabetes with A1c of 7.0 with here to discuss his new diagnosis of Type 2 diabetes mellitus.  He has lost 4 pounds since his last office visit as a result of being compliant with ADA diet and making healthy food choices. He has also increased his exercise regimen and an emphasis he did so has not needed to take his Ranitidine anymore for GERD. He is scheduled to see cardiology tomorrow.  He still has obstructive sleep apnea symptoms and snores at night and had reported at the last visit that he used s CPAP machine during his hospitalization with resulting improvement in symptoms.  Past Medical History  Diagnosis Date  . GERD (gastroesophageal reflux disease)   . Tobacco use   . Hyperglycemia   . Heart attack 09/2014  . Diabetes mellitus without complication    Past Surgical History  Procedure Laterality Date  . None    . Cardiac catheterization N/A 09/11/2014    Procedure: Left Heart Cath and Coronary Angiography;  Surgeon: Marykay Lexavid W Harding, MD;  [facility];  Service: Cardiovascular;  Laterality: N/A;  . Cardiac catheterization  09/11/2014    Procedure: Coronary Stent Intervention;  Surgeon: Marykay Lexavid W Harding, MD;  [facility];  Service: Cardiovascular;;   History   Social History  . Marital Status: Widowed    Spouse Name: N/A  . Number of Children: N/A  . Years of Education: N/A   Occupational History  . 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 Smoking status: Former Smoker -- 1.00 packs/day for 35 years    Types: Cigarettes    Quit date: 09/10/2014  . Smokeless tobacco: Never Used  . Alcohol Use: 0.0 oz/week    0 Standard drinks or equivalent per week      Comment: occassional  . Drug Use: No  . Sexual Activity: Yes   Other Topics Concern  . Not on file   Social History Narrative   Lives with fiance and 2 daughters. Neither his parents nor siblings have any cardiac issues that he knows of.    Allergies  Allergen Reactions  . Aspirin Anaphylaxis, Swelling and Rash    Throat closes   . Ibuprofen Anaphylaxis, Swelling and Rash    Current Outpatient Prescriptions on File Prior to Visit  Medication Sig Dispense Refill  . acetaminophen (TYLENOL) 500 MG tablet Take 1,000 mg by mouth every 6 (six) hours as needed for mild pain.    Marland Kitchen. atorvastatin (LIPITOR) 80 MG tablet Take 1 tablet (80 mg total) by mouth daily at 6 PM. 30 tablet 11  . carvedilol (COREG) 12.5 MG tablet Take 1 tablet (12.5 mg total) by mouth 2 (two) times daily with a meal. 60 tablet 11  . nitroGLYCERIN (NITROSTAT) 0.4 MG SL tablet Place 1 tablet (0.4 mg total) under the tongue every 5 (five) minutes x 3 doses as needed for chest pain. 25 tablet 12  . ticagrelor (BRILINTA) 90 MG TABS tablet Take 1 tablet (90 mg total) by mouth 2 (two) times daily. 60 tablet 11  . ranitidine (ZANTAC) 150 MG tablet Take 1 tablet (150 mg total) by mouth 2 (two)  times daily. (Patient not taking: Reported on 09/27/2014) 60 tablet 2   No current facility-administered medications on file prior to visit.      Review of Systems  General: negative for fever, weight loss, appetite change Eyes: no visual symptoms. ENT: snores at night Respiratory: no wheezing, shortness of breath, cough Cardiovascular: no chest pain, no dyspnea on exertion, no pedal edema, no orthopnea. Gastrointestinal: no abdominal pain, no diarrhea, no constipation Genito-Urinary: no urinary frequency, no dysuria, no polyuria. Hematologic: no bruising Endocrine: no cold or heat intolerance Neurological: no headaches, no seizures, no tremors Musculoskeletal: no joint pains, no joint swelling Skin: no pruritus, no  rash. Psychological: no depression, no anxiety,        Objective Filed Vitals:   09/27/14 0902  BP: 137/84  Pulse: 70  Temp: 98.2 F (36.8 C)  TempSrc: Oral  Resp: 18  Height: 6\' 3"  (1.905 m)  Weight: 301 lb (136.533 kg)  SpO2: 96%      Physical Exam  Constitutional: normal appearing,  HEENT: Head is atraumatic, normal sinuses, normal oropharynx, normal appearing tonsils and palate, tympanic membrane is normal bilaterally. Neck: normal range of motion, no thyromegaly, no JVD Cardiovascular: normal rate and rhythm, normal heart sounds, no murmurs, rub or gallop, no pedal edema Respiratory: clear to auscultation bilaterally, no wheezes, no rales, no rhonchi Abdomen: soft, not tender to palpation, normal bowel sounds, no enlarged organs Extremities: Full ROM, no tenderness in joints Skin: warm and dry, no lesions. Neurological: alert, oriented x3, normal sensation in feet Psychological: normal mood.          Assessment & Plan:  54 year old male with a history of 2 vessel CAD s/p DES placement in 09/2014 and is currently on Brilinta, hypertension, newly diagnosed diabetes with A1c of 7.0.  Type 2 diabetes mellitus: Recent diagnosis with A1c of 7.0; educated on diagnosis and management. Commenced on low dose glipizide and educated about hypoglycemic side effects; metformin contraindicated due to CHF. Placed on low-dose ACE inhibitor for renal protection. Keep blood sugar logs with fasting goals of 80-120 mg/dl, random of less than [id number] and in the event of sugars less than 60 mg/dl or greater than 562400 mg/dl advised to notify the clinic ASAP. Annual eye exams and annual foot exams recommended; micro albumin sent off. He will return with his Glucometer for Diabetic education tomorrow with the nurse.  Hypertension: Controlled.  Coronary artery disease: Scheduled to see cardiology tomorrow. Continue Brilinta.  Obstructive sleep apnea: Referred for sleep study.  Greater  than half of the time was spent educating on the diagnosis of diabetes, answering questions and counseling.  Addendum: 5/21- Reviewed Cardiology note; patient does not have CHF. Informed the patient over the phone.  Disclaimer: This note was dictated with voice recognition software. Similar sounding words can inadvertently be transcribed and this note may contain transcription errors which may not have been corrected upon publication of note.

## 2014-09-27 NOTE — Progress Notes (Signed)
Patient here for DM follow up. BS today is 223. Patient reports having a banana for breakfast. Patient needs glucometer. Patient suspects sugar elevated because he has suckers to replace cigarettes.

## 2014-09-27 NOTE — Progress Notes (Signed)
Quick Note:  Labs addressed at office as well as medications and patient was made aware. ______ 

## 2014-09-27 NOTE — Patient Instructions (Signed)
Diabetes Mellitus and Food It is important for you to manage your blood sugar (glucose) level. Your blood glucose level can be greatly affected by what you eat. Eating healthier foods in the appropriate amounts throughout the day at about the same time each day will help you control your blood glucose level. It can also help slow or prevent worsening of your diabetes mellitus. Healthy eating may even help you improve the level of your blood pressure and reach or maintain a healthy weight.  HOW CAN FOOD AFFECT ME? Carbohydrates Carbohydrates affect your blood glucose level more than any other type of food. Your dietitian will help you determine how many carbohydrates to eat at each meal and teach you how to count carbohydrates. Counting carbohydrates is important to keep your blood glucose at a healthy level, especially if you are using insulin or taking certain medicines for diabetes mellitus. Alcohol Alcohol can cause sudden decreases in blood glucose (hypoglycemia), especially if you use insulin or take certain medicines for diabetes mellitus. Hypoglycemia can be a life-threatening condition. Symptoms of hypoglycemia (sleepiness, dizziness, and disorientation) are similar to symptoms of having too much alcohol.  If your health care provider has given you approval to drink alcohol, do so in moderation and use the following guidelines:  Women should not have more than one drink per day, and men should not have more than two drinks per day. One drink is equal to:  12 oz of beer.  5 oz of wine.  1 oz of hard liquor.  Do not drink on an empty stomach.  Keep yourself hydrated. Have water, diet soda, or unsweetened iced tea.  Regular soda, juice, and other mixers might contain a lot of carbohydrates and should be counted. WHAT FOODS ARE NOT RECOMMENDED? As you make food choices, it is important to remember that all foods are not the same. Some foods have fewer nutrients per serving than other  foods, even though they might have the same number of calories or carbohydrates. It is difficult to get your body what it needs when you eat foods with fewer nutrients. Examples of foods that you should avoid that are high in calories and carbohydrates but low in nutrients include:  Trans fats (most processed foods list trans fats on the Nutrition Facts label).  Regular soda.  Juice.  Candy.  Sweets, such as cake, pie, doughnuts, and cookies.  Fried foods. WHAT FOODS CAN I EAT? Have nutrient-rich foods, which will nourish your body and keep you healthy. The food you should eat also will depend on several factors, including:  The calories you need.  The medicines you take.  Your weight.  Your blood glucose level.  Your blood pressure level.  Your cholesterol level. You also should eat a variety of foods, including:  Protein, such as meat, poultry, fish, tofu, nuts, and seeds (lean animal proteins are best).  Fruits.  Vegetables.  Dairy products, such as milk, cheese, and yogurt (low fat is best).  Breads, grains, pasta, cereal, rice, and beans.  Fats such as olive oil, trans fat-free margarine, canola oil, avocado, and olives. DOES EVERYONE WITH DIABETES MELLITUS HAVE THE SAME MEAL PLAN? Because every person with diabetes mellitus is different, there is not one meal plan that works for everyone. It is very important that you meet with a dietitian who will help you create a meal plan that is just right for you. Document Released: 01/22/2005 Document Revised: 05/02/2013 Document Reviewed: 03/24/2013 Exit[employer] Patient Information 2015 [employer]. This   information is not intended to replace advice given to you by your health care provider. Make sure you discuss any questions you have with your health care provider.  

## 2014-09-28 ENCOUNTER — Other Ambulatory Visit: Payer: Self-pay | Admitting: Family Medicine

## 2014-09-28 ENCOUNTER — Ambulatory Visit (INDEPENDENT_AMBULATORY_CARE_PROVIDER_SITE_OTHER): Payer: Self-pay | Admitting: Physician Assistant

## 2014-09-28 ENCOUNTER — Telehealth: Payer: Self-pay

## 2014-09-28 ENCOUNTER — Ambulatory Visit: Payer: Self-pay | Attending: Internal Medicine | Admitting: *Deleted

## 2014-09-28 ENCOUNTER — Encounter: Payer: Self-pay | Admitting: Physician Assistant

## 2014-09-28 VITALS — BP 140/80 | HR 56 | Ht 75.0 in | Wt 303.0 lb

## 2014-09-28 DIAGNOSIS — I10 Essential (primary) hypertension: Secondary | ICD-10-CM

## 2014-09-28 DIAGNOSIS — E119 Type 2 diabetes mellitus without complications: Secondary | ICD-10-CM | POA: Insufficient documentation

## 2014-09-28 DIAGNOSIS — E785 Hyperlipidemia, unspecified: Secondary | ICD-10-CM

## 2014-09-28 DIAGNOSIS — I2510 Atherosclerotic heart disease of native coronary artery without angina pectoris: Secondary | ICD-10-CM

## 2014-09-28 DIAGNOSIS — I214 Non-ST elevation (NSTEMI) myocardial infarction: Secondary | ICD-10-CM

## 2014-09-28 LAB — BASIC METABOLIC PANEL
BUN: 9 mg/dL (ref 6–23)
CO2: 26 meq/L (ref 19–32)
CREATININE: 0.97 mg/dL (ref 0.50–1.35)
Calcium: 9.7 mg/dL (ref 8.4–10.5)
Chloride: 102 mEq/L (ref 96–112)
Glucose, Bld: 199 mg/dL — ABNORMAL HIGH (ref 70–99)
Potassium: 5.8 mEq/L — ABNORMAL HIGH (ref 3.5–5.3)
Sodium: 137 mEq/L (ref 135–145)

## 2014-09-28 LAB — URINALYSIS
Glucose, UA: NEGATIVE mg/dL
Hgb urine dipstick: NEGATIVE
LEUKOCYTES UA: NEGATIVE
Nitrite: NEGATIVE
PROTEIN: NEGATIVE mg/dL
SPECIFIC GRAVITY, URINE: 1.026 (ref 1.005–1.030)
UROBILINOGEN UA: 0.2 mg/dL (ref 0.0–1.0)
pH: 5.5 (ref 5.0–8.0)

## 2014-09-28 LAB — MICROALBUMIN / CREATININE URINE RATIO
Creatinine, Urine: 226.4 mg/dL
Microalb Creat Ratio: 5.3 mg/g (ref 0.0–30.0)
Microalb, Ur: 1.2 mg/dL (ref <2.0)

## 2014-09-28 NOTE — Progress Notes (Signed)
Cardiology Office Note   Date:  09/28/2014   ID:  William Young, DOB Feb 08, 1961, MRN [id number]  PCP:  No PCP Per Patient  Cardiologist:  Dr. Dorris Carnes     Chief Complaint  Patient presents with  . Hospitalization Follow-up    Non-STEMI  . Coronary Artery Disease     History of Present Illness: William Young is a 54 y.o. male who was recently admitted 5/2-5/4 with a non-STEMI. On the date of admission, he awoke with sudden onset chest pain. LHC demonstrated single-vessel CAD with 90% proximal RCA and 80% right posterior lateral branch #3 stenosis. He underwent PCI with a BMS to the proximal RCA and a resolute DES to the RPLB3. The patient was not placed on aspirin due to history of true allergy with anaphylaxis. Brilinta was recommended for a minimum of 1 year. Patient could be considered for conversion to Plavix 6-12 months post PCI. LV function was noted to be normal on left ventriculogram with inferior wall motion abnormality.  Patient was noted to have a hemoglobin A1c of 7. He was seen by the diabetic educator. Outpatient referral to primary care was recommended. He was discharged home on a combination of Brilinta, carvedilol and high-dose atorvastatin. He has been referred to the [facility] and has been placed on glipizide. He has also placed on ACE inhibitor therapy since discharge from the hospital.   He returns for FU.  He has been doing very well since DC. He met with the diabetic educator at his PCPs office today.  He has changed his diet and stopped smoking.  He is back at work and goes for walks.  The patient denies chest pain, shortness of breath, syncope, orthopnea, PND or significant pedal edema.    Studies/Reports Reviewed Today:  LHC/PCI 09/11/14 LM: ok LAD:  Mid 15% LCx:  Ok RCA:  prox 90%, mid 40%, RPLB3 80% LVEF normal PCI:  MultiLink Vision BMS 5.0 mm x 18 mm (5.2 mm) to pRCA;  Resolute DES 2.5 mm x 14 mm (2.8 mm) to  RPLB3   Past Medical History  Diagnosis Date  . GERD (gastroesophageal reflux disease)   . Tobacco use   . Hyperglycemia   . CAD (coronary artery disease) 09/2014    a. NSTEMI >> LHC 5/16:  mLAD 15, pRCA 90, mRCA 40, RPLB3 80, EF normal with inf HK >> PCI:  BMS to pRCA and DES to RPLB3   . Diabetes mellitus without complication   . HTN (hypertension)   . OSA (obstructive sleep apnea)     Past Surgical History  Procedure Laterality Date  . None    . Cardiac catheterization N/A 09/11/2014    Procedure: Left Heart Cath and Coronary Angiography;  Surgeon: Leonie Man, MD;  Location: [facility] INVASIVE CV LAB CUPID;  Service: Cardiovascular;  Laterality: N/A;  . Cardiac catheterization  09/11/2014    Procedure: Coronary Stent Intervention;  Surgeon: Leonie Man, MD;  Location: [facility] INVASIVE CV LAB CUPID;  Service: Cardiovascular;;     Current Outpatient Prescriptions  Medication Sig Dispense Refill  . acetaminophen (TYLENOL) 500 MG tablet Take 1,000 mg by mouth every 6 (six) hours as needed for mild pain.    Marland Kitchen atorvastatin (LIPITOR) 80 MG tablet Take 1 tablet (80 mg total) by mouth daily at 6 PM. 30 tablet 11  . Blood Glucose Monitoring Suppl (TRUERESULT BLOOD GLUCOSE) W/DEVICE KIT 1 each by Does not apply route 2 (two) times  daily. 1 each 0  . carvedilol (COREG) 12.5 MG tablet Take 1 tablet (12.5 mg total) by mouth 2 (two) times daily with a meal. 60 tablet 11  . glipiZIDE (GLUCOTROL) 5 MG tablet Take 0.5 tablets (2.5 mg total) by mouth 2 (two) times daily before a meal. 60 tablet 3  . glucose blood (TRUETEST TEST) test strip Use as instructed 100 each 12  . Lancets 30G MISC 1 each by Does not apply route 2 (two) times daily. 100 each 6  . lisinopril (PRINIVIL,ZESTRIL) 5 MG tablet Take 1 tablet (5 mg total) by mouth daily. 30 tablet 3  . nitroGLYCERIN (NITROSTAT) 0.4 MG SL tablet Place 1 tablet (0.4 mg total) under the tongue every 5 (five) minutes x 3 doses as needed for chest pain. 25  tablet 12  . ranitidine (ZANTAC) 150 MG tablet Take 1 tablet (150 mg total) by mouth 2 (two) times daily. 60 tablet 2  . ticagrelor (BRILINTA) 90 MG TABS tablet Take 1 tablet (90 mg total) by mouth 2 (two) times daily. 60 tablet 11   No current facility-administered medications for this visit.    Allergies:   Aspirin and Ibuprofen    Social History:  The patient  reports that he quit smoking about 2 weeks ago. His smoking use included Cigarettes. He has a 35 pack-year smoking history. He has never used smokeless tobacco. He reports that he drinks alcohol. He reports that he does not use illicit drugs.   Family History:  The patient's family history includes Cancer in his maternal grandfather; Heart attack in his maternal grandmother, maternal uncle, and paternal grandmother. There is no history of Stroke.    ROS:   Please see the history of present illness.   Review of Systems  Respiratory: Positive for snoring.   All other systems reviewed and are negative.    PHYSICAL EXAM: VS:  BP 140/80 mmHg  Pulse 56  Ht '6\' 3"'  (1.905 m)  Wt 303 lb (137.44 kg)  BMI 37.87 kg/m2    Wt Readings from Last 3 Encounters:  09/28/14 303 lb (137.44 kg)  09/27/14 301 lb (136.533 kg)  09/14/14 305 lb (138.347 kg)     GEN: Well nourished, well developed, in no acute distress HEENT: normal Neck: no JVD, no masses Cardiac:  Normal S1/S2, RRR; no murmur, no rubs or gallops, no edema; right wrist without hematoma or mass  Respiratory:  clear to auscultation bilaterally, no wheezing, rhonchi or rales. GI: soft, nontender, nondistended, + BS MS: no deformity or atrophy Skin: warm and dry  Neuro:  CNs II-XII intact, Strength and sensation are intact Psych: Normal affect   EKG:  EKG is ordered today.  It demonstrates:   Sinus brady, HR 56, inf Q waves with inf TWI, no change from prior   Recent Labs: 09/10/2014: ALT 23; Magnesium 2.2; TSH 1.661 09/12/2014: Hemoglobin 12.9*; Platelets 148* 09/27/2014:  BUN 9; Creatinine 0.97; Potassium 5.8*; Sodium 137    Lipid Panel    Component Value Date/Time   CHOL 216* 09/10/2014 1522   TRIG 105 09/10/2014 1522   HDL 36* 09/10/2014 1522   CHOLHDL 6.0 09/10/2014 1522   VLDL 21 09/10/2014 1522   LDLCALC 159* 09/10/2014 1522      ASSESSMENT AND PLAN:  Coronary artery disease involving native coronary artery of native heart without angina pectoris:  He is doing very well since discharge from the hospital. He denies angina. He has been treated with Brilinta only secondary to a history  of true aspirin allergy with anaphylaxis. I will refer him to cardiac rehabilitation. Consider transitioning Brilinta to Plavix after 1 year. Continue beta blocker, statin. Recent lab work yesterday demonstrated an elevated potassium. He admits to a diet high in potassium. We discussed limiting his dietary potassium. I have asked him to hold his lisinopril for now. Repeat a BMET next week. If his potassium is normal, restart lisinopril with close follow-up on renal function and potassium. Of note, I reviewed records from primary care. They indicated that he has an ejection fraction of 30-35%. I reviewed the patient's hospital records. He does not have an EF of 30-35%. His ejection fraction was normal at cardiac catheterization. I reviewed this with the patient today and reassured him. Given his recent myocardial infarction I will arrange an echocardiogram for follow-up on ejection fraction.  Essential hypertension:  Blood pressure somewhat elevated this morning. He has not yet started on lisinopril. Continue to monitor.  HLD (hyperlipidemia): Continue high-dose statin. Arrange follow-up lipids and LFTs in 6-8 weeks.    Type 2 diabetes mellitus without complication:  Follow-up with primary care as planned. Of note, the patient does not have congestive heart failure and would be able to take metformin if needed.    Current medicines are reviewed at length with the patient  today.  Concerns regarding medicines are as outlined above.  The following changes have been made:    Hold Lisinopril for now.  Resume if K+ ok next week.   Labs/ tests ordered today include:  Orders Placed This Encounter  Procedures  . Basic Metabolic Panel (BMET)  . Hepatic function panel  . Lipid Profile  . Basic Metabolic Panel (BMET)  . EKG 12-Lead  . Echocardiogram    Disposition:   FU with Dr. Dorris Carnes 8 weeks.    Signed, Versie Starks, MHS 09/28/2014 12:17 PM    [facility] [address], [place], [place]  [address] Phone: [phone]; Fax: [phone]

## 2014-09-28 NOTE — Patient Instructions (Signed)
Medication Instructions:  HOLD LISINOPRIL UNTIL NOTIFIED BY CARDIOLOGY   Labwork: 1. BMET Monday 5/23 OR Tuesday 5/24  2. BMET TO BE DONE IN 2 WEEKS  3. FASTING LIPID AND LIVER PANEL TO BE DONE ON 11/26/14 SAME DAY YOU SEE DR. ROSS  Testing/Procedures: Your physician has requested that you have an echocardiogram. Echocardiography is a painless test that uses sound waves to create images of your heart. It provides your doctor with information about the size and shape of your heart and how well your heart's chambers and valves are working. This procedure takes approximately one hour. There are no restrictions for this procedure.  Follow-Up: 11/26/14 @ 8 AMWITH DR. ROSS  Any Other Special Instructions Will Be Listed Below (If Applicable).  Potassium Content of Foods Potassium is a mineral found in many foods and drinks. It helps keep fluids and minerals balanced in your body and affects how steadily your heart beats. Potassium also helps control your blood pressure and keep your muscles and nervous system healthy. Certain health conditions and medicines may change the balance of potassium in your body. When this happens, you can help balance your level of potassium through the foods that you do or do not eat. Your health care provider or dietitian may recommend an amount of potassium that you should have each day. The following lists of foods provide the amount of potassium (in parentheses) per serving in each item. HIGH IN POTASSIUM  The following foods and beverages have 200 mg or more of potassium per serving:  Apricots, 2 raw or 5 dry (200 mg).  Artichoke, 1 medium (345 mg).  Avocado, raw,  each (245 mg).  Banana, 1 medium (425 mg).  Beans, lima, or baked beans, canned,  cup (280 mg).  Beans, white, canned,  cup (595 mg).  Beef roast, 3 oz (320 mg).  Beef, ground, 3 oz (270 mg).  Beets, raw or cooked,  cup (260 mg).  Bran muffin, 2 oz (300 mg).  Broccoli,  cup (230  mg).  Brussels sprouts,  cup (250 mg).  Cantaloupe,  cup (215 mg).  Cereal, 100% bran,  cup (200-400 mg).  Cheeseburger, single, fast food, 1 each (225-400 mg).  Chicken, 3 oz (220 mg).  Clams, canned, 3 oz (535 mg).  Crab, 3 oz (225 mg).  Dates, 5 each (270 mg).  Dried beans and peas,  cup (300-475 mg).  Figs, dried, 2 each (260 mg).  Fish: halibut, tuna, cod, snapper, 3 oz (480 mg).  Fish: salmon, haddock, swordfish, perch, 3 oz (300 mg).  Fish, tuna, canned 3 oz (200 mg).  [place] fries, fast food, 3 oz (470 mg).  Granola with fruit and nuts,  cup (200 mg).  Grapefruit juice,  cup (200 mg).  Greens, beet,  cup (655 mg).  Honeydew melon,  cup (200 mg).  Kale, raw, 1 cup (300 mg).  Kiwi, 1 medium (240 mg).  Kohlrabi, rutabaga, parsnips,  cup (280 mg).  Lentils,  cup (365 mg).  Mango, 1 each (325 mg).  Milk, chocolate, 1 cup (420 mg).  Milk: nonfat, low-fat, whole, buttermilk, 1 cup (350-380 mg).  Molasses, 1 Tbsp (295 mg).  Mushrooms,  cup (280) mg.  Nectarine, 1 each (275 mg).  Nuts: almonds, peanuts, hazelnuts, [place], cashew, mixed, 1 oz (200 mg).  Nuts, pistachios, 1 oz (295 mg).  Orange, 1 each (240 mg).  Orange juice,  cup (235 mg).  Papaya, medium,  fruit (390 mg).  Peanut butter, chunky, 2 Tbsp (240  mg).  Peanut butter, smooth, 2 Tbsp (210 mg).  Pear, 1 medium (200 mg).  Pomegranate, 1 whole (400 mg).  Pomegranate juice,  cup (215 mg).  Pork, 3 oz (350 mg).  Potato chips, salted, 1 oz (465 mg).  Potato, baked with skin, 1 medium (925 mg).  Potatoes, boiled,  cup (255 mg).  Potatoes, mashed,  cup (330 mg).  Prune juice,  cup (370 mg).  Prunes, 5 each (305 mg).  Pudding, chocolate,  cup (230 mg).  Pumpkin, canned,  cup (250 mg).  Raisins, seedless,  cup (270 mg).  Seeds, sunflower or pumpkin, 1 oz (240 mg).  Soy milk, 1 cup (300 mg).  Spinach,  cup (420 mg).  Spinach, canned,  cup (370  mg).  Sweet potato, baked with skin, 1 medium (450 mg).  Swiss chard,  cup (480 mg).  Tomato or vegetable juice,  cup (275 mg).  Tomato sauce or puree,  cup (400-550 mg).  Tomato, raw, 1 medium (290 mg).  Tomatoes, canned,  cup (200-300 mg).  [place], 3 oz (250 mg).  Wheat germ, 1 oz (250 mg).  Winter squash,  cup (250 mg).  Yogurt, plain or fruited, 6 oz (260-435 mg).  Zucchini,  cup (220 mg). MODERATE IN POTASSIUM The following foods and beverages have 50-200 mg of potassium per serving:  Apple, 1 each (150 mg).  Apple juice,  cup (150 mg).  Applesauce,  cup (90 mg).  Apricot nectar,  cup (140 mg).  Asparagus, small spears,  cup or 6 spears (155 mg).  Bagel, cinnamon raisin, 1 each (130 mg).  Bagel, egg or plain, 4 in., 1 each (70 mg).  Beans, green,  cup (90 mg).  Beans, yellow,  cup (190 mg).  Beer, regular, 12 oz (100 mg).  Beets, canned,  cup (125 mg).  Blackberries,  cup (115 mg).  Blueberries,  cup (60 mg).  Bread, whole wheat, 1 slice (70 mg).  Broccoli, raw,  cup (145 mg).  Cabbage,  cup (150 mg).  Carrots, cooked or raw,  cup (180 mg).  Cauliflower, raw,  cup (150 mg).  Celery, raw,  cup (155 mg).  Cereal, bran flakes, cup (120-150 mg).  Cheese, cottage,  cup (110 mg).  Cherries, 10 each (150 mg).  Chocolate, 1 oz bar (165 mg).  Coffee, brewed 6 oz (90 mg).  Corn,  cup or 1 ear (195 mg).  Cucumbers,  cup (80 mg).  Egg, large, 1 each (60 mg).  Eggplant,  cup (60 mg).  Endive, raw, cup (80 mg).  English muffin, 1 each (65 mg).  Fish, orange roughy, 3 oz (150 mg).  Frankfurter, beef or pork, 1 each (75 mg).  Fruit cocktail,  cup (115 mg).  Grape juice,  cup (170 mg).  Grapefruit,  fruit (175 mg).  Grapes,  cup (155 mg).  Greens: kale, turnip, collard,  cup (110-150 mg).  Ice cream or frozen yogurt, chocolate,  cup (175 mg).  Ice cream or frozen yogurt, vanilla,  cup (120-150  mg).  Lemons, limes, 1 each (80 mg).  Lettuce, all types, 1 cup (100 mg).  Mixed vegetables,  cup (150 mg).  Mushrooms, raw,  cup (110 mg).  Nuts: walnuts, pecans, or macadamia, 1 oz (125 mg).  Oatmeal,  cup (80 mg).  Okra,  cup (110 mg).  Onions, raw,  cup (120 mg).  Peach, 1 each (185 mg).  Peaches, canned,  cup (120 mg).  Pears, canned,  cup (120 mg).  Peas, green, frozen,  cup (90 mg).  Peppers, green,  cup (130 mg).  Peppers, red,  cup (160 mg).  Pineapple juice,  cup (165 mg).  Pineapple, fresh or canned,  cup (100 mg).  Plums, 1 each (105 mg).  Pudding, vanilla,  cup (150 mg).  Raspberries,  cup (90 mg).  Rhubarb,  cup (115 mg).  Rice, wild,  cup (80 mg).  Shrimp, 3 oz (155 mg).  Spinach, raw, 1 cup (170 mg).  Strawberries,  cup (125 mg).  Summer squash  cup (175-200 mg).  Swiss chard, raw, 1 cup (135 mg).  Tangerines, 1 each (140 mg).  Tea, brewed, 6 oz (65 mg).  Turnips,  cup (140 mg).  Watermelon,  cup (85 mg).  Wine, red, table, 5 oz (180 mg).  Wine, white, table, 5 oz (100 mg). LOW IN POTASSIUM The following foods and beverages have less than 50 mg of potassium per serving.  Bread, white, 1 slice (30 mg).  Carbonated beverages, 12 oz (less than 5 mg).  Cheese, 1 oz (20-30 mg).  Cranberries,  cup (45 mg).  Cranberry juice cocktail,  cup (20 mg).  Fats and oils, 1 Tbsp (less than 5 mg).  Hummus, 1 Tbsp (32 mg).  Nectar: papaya, mango, or pear,  cup (35 mg).  Rice, white or brown,  cup (50 mg).  Spaghetti or macaroni,  cup cooked (30 mg).  Tortilla, flour or corn, 1 each (50 mg).  Waffle, 4 in., 1 each (50 mg).  Water chestnuts,  cup (40 mg). Document Released: 12/09/2004 Document Revised: 05/02/2013 Document Reviewed: 03/24/2013 [facility] Patient Information 2015 [place], [place]. This information is not intended to replace advice given to you by your health care provider. Make sure you  discuss any questions you have with your health care provider.

## 2014-09-28 NOTE — Progress Notes (Signed)
Patient presents with wife for glucometer instruction Patient shown how to prepare lancet, insert test strip into meter, how to prepare site for testing, and how to draw blood into test strip with correct return demonstration Patient tested his BS: 185 2 hours after eating banana Patient aware to check BS twice daily: AM fasting and before evening meal. Patient was given BS log at last visit and instructed on use. Patient aware to bring BS log to all future visits.

## 2014-09-28 NOTE — Telephone Encounter (Signed)
Nurse called patient, reached voicemail. Left message for patient to call William Young at [phone].   

## 2014-10-01 ENCOUNTER — Other Ambulatory Visit: Payer: Self-pay

## 2014-10-01 NOTE — Telephone Encounter (Signed)
Dr. Venetia NightAmao contacted patient.

## 2014-10-02 ENCOUNTER — Other Ambulatory Visit (INDEPENDENT_AMBULATORY_CARE_PROVIDER_SITE_OTHER): Payer: Self-pay | Admitting: *Deleted

## 2014-10-02 DIAGNOSIS — I10 Essential (primary) hypertension: Secondary | ICD-10-CM

## 2014-10-02 LAB — BASIC METABOLIC PANEL
BUN: 11 mg/dL (ref 6–23)
CO2: 21 mEq/L (ref 19–32)
Calcium: 9.1 mg/dL (ref 8.4–10.5)
Chloride: 103 mEq/L (ref 96–112)
Creatinine, Ser: 0.85 mg/dL (ref 0.40–1.50)
GFR: 99.82 mL/min (ref >60.00)
GLUCOSE: 312 mg/dL — AB (ref 70–99)
POTASSIUM: 4.2 meq/L (ref 3.5–5.1)
SODIUM: 132 meq/L — AB (ref 135–145)

## 2014-10-10 ENCOUNTER — Telehealth: Payer: Self-pay | Admitting: Physician Assistant

## 2014-10-10 DIAGNOSIS — I10 Essential (primary) hypertension: Secondary | ICD-10-CM

## 2014-10-10 NOTE — Telephone Encounter (Signed)
New message ° ° ° ° °Returning a nurses call to get lab results °

## 2014-10-10 NOTE — Telephone Encounter (Signed)
Pt notified of lab results today and ok to start lisinopril today. Bmet 6/8 when he comes in for echo. Pt verbalized understanding to plan of care.

## 2014-10-11 ENCOUNTER — Ambulatory Visit: Payer: Self-pay | Attending: Internal Medicine | Admitting: Internal Medicine

## 2014-10-11 ENCOUNTER — Encounter: Payer: Self-pay | Admitting: Internal Medicine

## 2014-10-11 VITALS — BP 115/77 | HR 72 | Temp 97.8°F | Resp 16 | Wt 301.0 lb

## 2014-10-11 DIAGNOSIS — I10 Essential (primary) hypertension: Secondary | ICD-10-CM

## 2014-10-11 DIAGNOSIS — I2510 Atherosclerotic heart disease of native coronary artery without angina pectoris: Secondary | ICD-10-CM

## 2014-10-11 DIAGNOSIS — E119 Type 2 diabetes mellitus without complications: Secondary | ICD-10-CM

## 2014-10-11 LAB — GLUCOSE, POCT (MANUAL RESULT ENTRY): POC GLUCOSE: 193 mg/dL — AB (ref 70–99)

## 2014-10-11 MED ORDER — GLIPIZIDE 5 MG PO TABS: 5.0000 mg | ORAL_TABLET | Freq: Two times a day (BID) | ORAL | Status: DC

## 2014-10-11 NOTE — Progress Notes (Signed)
Patient is here for follow up on DM

## 2014-10-11 NOTE — Progress Notes (Signed)
Patient ID: William Young, male   DOB: 04/14/1961, 54 y.o.   MRN: [id number]  CC: DM f/u  HPI: William Young is a 54 y.o. male here today for a follow up visit.  Patient has past medical history of GERD, T2DM, CAD, HTN, OSA, and NSTEMI with stent placement. Patient was last seen by Dr. Amao 2 weeks ago for a new onset diabetes education. He was at that time started on glipizide.  He has been noticing hand and leg cramps since he begun taking lisinopril yesterday. He checks his sugars twice per day before meals with ranges of 134-185.  He reports that he eats late at night when he gets off and had 4 slices of bread last night which causes him to have a elevated blood sugar upon awakening.  He has no problems with taking coreg or lipitor. He has not noticed any side effects. No claudications or myalgias.    Patient has No headache, No chest pain, No abdominal pain - No Nausea, No new weakness tingling or numbness, No Cough - SOB.  Allergies  Allergen Reactions  . Aspirin Anaphylaxis, Swelling and Rash    Throat closes   . Ibuprofen Anaphylaxis, Swelling and Rash   Past Medical History  Diagnosis Date  . GERD (gastroesophageal reflux disease)   . Tobacco use   . Hyperglycemia   . CAD (coronary artery disease) 09/2014    a. NSTEMI >> LHC 5/16:  mLAD 15, pRCA 90, mRCA 40, RPLB3 80, EF normal with inf HK >> PCI:  BMS to pRCA and DES to RPLB3   . Diabetes mellitus without complication   . HTN (hypertension)   . OSA (obstructive sleep apnea)    Current Outpatient Prescriptions on File Prior to Visit  Medication Sig Dispense Refill  . acetaminophen (TYLENOL) 500 MG tablet Take 1,000 mg by mouth every 6 (six) hours as needed for mild pain.    . atorvastatin (LIPITOR) 80 MG tablet Take 1 tablet (80 mg total) by mouth daily at 6 PM. 30 tablet 11  . Blood Glucose Monitoring Suppl (TRUERESULT BLOOD GLUCOSE) W/DEVICE KIT 1 each by Does not apply route 2 (two) times daily. 1 each 0  . carvedilol  (COREG) 12.5 MG tablet Take 1 tablet (12.5 mg total) by mouth 2 (two) times daily with a meal. 60 tablet 11  . glipiZIDE (GLUCOTROL) 5 MG tablet Take 0.5 tablets (2.5 mg total) by mouth 2 (two) times daily before a meal. 60 tablet 3  . glucose blood (TRUETEST TEST) test strip Use as instructed 100 each 12  . Lancets 30G MISC 1 each by Does not apply route 2 (two) times daily. 100 each 6  . lisinopril (PRINIVIL,ZESTRIL) 5 MG tablet Take 1 tablet (5 mg total) by mouth daily. 30 tablet 3  . nitroGLYCERIN (NITROSTAT) 0.4 MG SL tablet Place 1 tablet (0.4 mg total) under the tongue every 5 (five) minutes x 3 doses as needed for chest pain. 25 tablet 12  . ranitidine (ZANTAC) 150 MG tablet Take 1 tablet (150 mg total) by mouth 2 (two) times daily. 60 tablet 2  . ticagrelor (BRILINTA) 90 MG TABS tablet Take 1 tablet (90 mg total) by mouth 2 (two) times daily. 60 tablet 11   No current facility-administered medications on file prior to visit.   Family History  Problem Relation Age of Onset  . Heart attack Maternal Uncle   . Heart attack Paternal Grandmother   . Cancer Maternal Grandfather   . Heart   attack Maternal Grandmother   . Stroke Neg Hx    History   Social History  . Marital Status: Widowed    Spouse Name: N/A  . Number of Children: N/A  . Years of Education: N/A   Occupational History  . 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 Smoking status: Former Smoker -- 1.00 packs/day for 35 years    Types: Cigarettes    Quit date: 09/10/2014  . Smokeless tobacco: Never Used  . Alcohol Use: 0.0 oz/week    0 Standard drinks or equivalent per week     Comment: occassional  . Drug Use: No  . Sexual Activity: Yes   Other Topics Concern  . Not on file   Social History Narrative   Lives with fiance and 2 daughters. Neither his parents nor siblings have any cardiac issues that he knows of.    Review of Systems: See HPI    Objective:   Filed Vitals:    10/11/14 0923  BP: 115/77  Pulse: 72  Temp: 97.8 F (36.6 C)  Resp: 16    Physical Exam  Constitutional: He is oriented to person, place, and time.  Cardiovascular: Normal rate, regular rhythm and normal heart sounds.   Pulmonary/Chest: Effort normal and breath sounds normal.  Musculoskeletal: He exhibits no edema.  Neurological: He is alert and oriented to person, place, and time.     Lab Results  Component Value Date   WBC 7.0 09/12/2014   HGB 12.9* 09/12/2014   HCT 37.6* 09/12/2014   MCV 90.6 09/12/2014   PLT 148* 09/12/2014   Lab Results  Component Value Date   CREATININE 0.85 10/02/2014   BUN 11 10/02/2014   NA 132* 10/02/2014   K 4.2 10/02/2014   CL 103 10/02/2014   CO2 21 10/02/2014    Lab Results  Component Value Date   HGBA1C 7.0* 09/10/2014   Lipid Panel     Component Value Date/Time   CHOL 216* 09/10/2014 1522   TRIG 105 09/10/2014 1522   HDL 36* 09/10/2014 1522   CHOLHDL 6.0 09/10/2014 1522   VLDL 21 09/10/2014 1522   LDLCALC 159* 09/10/2014 1522       Assessment and plan:   William Young was seen today for diabetes.  Diagnoses and all orders for this visit:  Type 2 diabetes mellitus without complication Orders: -     Glucose (CBG) -    Increased to glipiZIDE (GLUCOTROL) 5 MG tablet; Take 1 tablet (5 mg total) by mouth 2 (two) times daily before a meal. Patient was currently on 2.5 mg and I have increased to 5 mg for better control. Diet plan discussed, foot care, eye care, and blood sugar goals.  Essential hypertension Patient blood pressure is stable and may continue on current medication.  Education on diet, exercise, and modifiable risk factors discussed. Will obtain appropriate labs as needed. Will follow up in 3-6 months.  Continue coreg and lisinopril for renal protection.  Coronary artery disease involving native coronary artery of native heart without angina pectoris Continue statin and diet changes discussed. Explained why statin  therapy is essential with his past medical history.  Morbid obesity Weight loss discussed in detail.  Return in about 2 weeks (around 10/25/2014) for Nurse Visit-CBG log and 3 mo PCP .       KECK, VALERIE, NP-C [facility] [phone] 10/11/2014, 9:29 AM' 

## 2014-10-11 NOTE — Patient Instructions (Addendum)
Increase glipizide to 5 mg tablet (whole) will see you in 2 weeks.    Diabetes Mellitus and Food It is important for you to manage your blood sugar (glucose) level. Your blood glucose level can be greatly affected by what you eat. Eating healthier foods in the appropriate amounts throughout the day at about the same time each day will help you control your blood glucose level. It can also help slow or prevent worsening of your diabetes mellitus. Healthy eating may even help you improve the level of your blood pressure and reach or maintain a healthy weight.  HOW CAN FOOD AFFECT ME? Carbohydrates Carbohydrates affect your blood glucose level more than any other type of food. Your dietitian will help you determine how many carbohydrates to eat at each meal and teach you how to count carbohydrates. Counting carbohydrates is important to keep your blood glucose at a healthy level, especially if you are using insulin or taking certain medicines for diabetes mellitus. Alcohol Alcohol can cause sudden decreases in blood glucose (hypoglycemia), especially if you use insulin or take certain medicines for diabetes mellitus. Hypoglycemia can be a life-threatening condition. Symptoms of hypoglycemia (sleepiness, dizziness, and disorientation) are similar to symptoms of having too much alcohol.  If your health care provider has given you approval to drink alcohol, do so in moderation and use the following guidelines:  Women should not have more than one drink per day, and men should not have more than two drinks per day. One drink is equal to:  12 oz of beer.  5 oz of wine.  1 oz of hard liquor.  Do not drink on an empty stomach.  Keep yourself hydrated. Have water, diet soda, or unsweetened iced tea.  Regular soda, juice, and other mixers might contain a lot of carbohydrates and should be counted. WHAT FOODS ARE NOT RECOMMENDED? As you make food choices, it is important to remember that all foods are  not the same. Some foods have fewer nutrients per serving than other foods, even though they might have the same number of calories or carbohydrates. It is difficult to get your body what it needs when you eat foods with fewer nutrients. Examples of foods that you should avoid that are high in calories and carbohydrates but low in nutrients include:  Trans fats (most processed foods list trans fats on the Nutrition Facts label).  Regular soda.  Juice.  Candy.  Sweets, such as cake, pie, doughnuts, and cookies.  Fried foods. WHAT FOODS CAN I EAT? Have nutrient-rich foods, which will nourish your body and keep you healthy. The food you should eat also will depend on several factors, including:  The calories you need.  The medicines you take.  Your weight.  Your blood glucose level.  Your blood pressure level.  Your cholesterol level. You also should eat a variety of foods, including:  Protein, such as meat, poultry, fish, tofu, nuts, and seeds (lean animal proteins are best).  Fruits.  Vegetables.  Dairy products, such as milk, cheese, and yogurt (low fat is best).  Breads, grains, pasta, cereal, rice, and beans.  Fats such as olive oil, trans fat-free margarine, canola oil, avocado, and olives. DOES EVERYONE WITH DIABETES MELLITUS HAVE THE SAME MEAL PLAN? Because every person with diabetes mellitus is different, there is not one meal plan that works for everyone. It is very important that you meet with a dietitian who will help you create a meal plan that is just right for you.  Document Released: 01/22/2005 Document Revised: 05/02/2013 Document Reviewed: 03/24/2013 [facility] Patient Information 2015 [place], [place]. This information is not intended to replace advice given to you by your health care provider. Make sure you discuss any questions you have with your health care provider.

## 2014-10-17 ENCOUNTER — Encounter: Payer: Self-pay | Admitting: Physician Assistant

## 2014-10-17 ENCOUNTER — Other Ambulatory Visit: Payer: Self-pay

## 2014-10-17 ENCOUNTER — Other Ambulatory Visit (INDEPENDENT_AMBULATORY_CARE_PROVIDER_SITE_OTHER): Payer: Self-pay | Admitting: *Deleted

## 2014-10-17 ENCOUNTER — Ambulatory Visit (HOSPITAL_COMMUNITY): Payer: Self-pay | Attending: Cardiology

## 2014-10-17 DIAGNOSIS — I2510 Atherosclerotic heart disease of native coronary artery without angina pectoris: Secondary | ICD-10-CM | POA: Insufficient documentation

## 2014-10-17 DIAGNOSIS — I214 Non-ST elevation (NSTEMI) myocardial infarction: Secondary | ICD-10-CM

## 2014-10-17 DIAGNOSIS — I252 Old myocardial infarction: Secondary | ICD-10-CM | POA: Insufficient documentation

## 2014-10-17 DIAGNOSIS — E669 Obesity, unspecified: Secondary | ICD-10-CM | POA: Insufficient documentation

## 2014-10-17 DIAGNOSIS — I10 Essential (primary) hypertension: Secondary | ICD-10-CM

## 2014-10-17 DIAGNOSIS — Z720 Tobacco use: Secondary | ICD-10-CM | POA: Insufficient documentation

## 2014-10-17 DIAGNOSIS — Z6838 Body mass index (BMI) 38.0-38.9, adult: Secondary | ICD-10-CM | POA: Insufficient documentation

## 2014-10-17 LAB — BASIC METABOLIC PANEL
BUN: 13 mg/dL (ref 6–23)
CALCIUM: 7.9 mg/dL — AB (ref 8.4–10.5)
CHLORIDE: 111 meq/L (ref 96–112)
CO2: 20 mEq/L (ref 19–32)
Creatinine, Ser: 0.64 mg/dL (ref 0.40–1.50)
GFR: 138.47 mL/min (ref >60.00)
GLUCOSE: 201 mg/dL — AB (ref 70–99)
Potassium: 3.6 mEq/L (ref 3.5–5.1)
Sodium: 136 mEq/L (ref 135–145)

## 2014-10-17 MED ORDER — PERFLUTREN LIPID MICROSPHERE
3.0000 mL | Freq: Once | INTRAVENOUS | Status: AC
  Administered 2014-10-17: 3 mL via INTRAVENOUS

## 2014-10-18 ENCOUNTER — Telehealth: Payer: Self-pay | Admitting: *Deleted

## 2014-10-18 NOTE — Telephone Encounter (Signed)
Pt notified of lab and echo results with verbal understanding.

## 2014-11-02 ENCOUNTER — Ambulatory Visit: Payer: Self-pay | Attending: Internal Medicine | Admitting: *Deleted

## 2014-11-02 VITALS — BP 107/68 | HR 78 | Temp 98.0°F | Resp 20 | Ht 76.0 in | Wt 300.2 lb

## 2014-11-02 DIAGNOSIS — I10 Essential (primary) hypertension: Secondary | ICD-10-CM

## 2014-11-02 DIAGNOSIS — E1165 Type 2 diabetes mellitus with hyperglycemia: Secondary | ICD-10-CM | POA: Insufficient documentation

## 2014-11-02 LAB — GLUCOSE, POCT (MANUAL RESULT ENTRY): POC Glucose: 188 mg/dl — AB (ref 70–99)

## 2014-11-02 NOTE — Progress Notes (Signed)
Patient presents with wife and daughters for BP check, CBG and record review for T2DM Med list reviewed; patient reports taking all meds as directed except ran out of glipizide 5 days ago Patient's AM fasting blood sugars ranging 105-185 prior to running out of glipizide. 198 highest fasting BS off glipizide Patient's before dinner blood sugars ranging 113-212 Denies increased thirst and urination Discussed concentrating on low carb options Patient trying to follow low sodium diet  CBG 198 AM fasting per patient record CBG 188 5 hours after meal  Lab Results  Component Value Date   HGBA1C 7.0* 09/10/2014   Filed Vitals:   11/02/14 1448  BP: 107/68  Pulse: 78  Temp: 98 F (36.7 C)  Resp: 20    Per PCP: No changes to meds at this time F/u with PCP in 3 months  Patient advised to call for med refills at least 7 days before running out so as not to go without.  Patient aware that he is to f/u with PCP 3 months from last visit. Due 01/11/2015  Patient given literature on Basic Carb Counting

## 2014-11-02 NOTE — Patient Instructions (Signed)

## 2014-11-26 ENCOUNTER — Other Ambulatory Visit (INDEPENDENT_AMBULATORY_CARE_PROVIDER_SITE_OTHER): Payer: Self-pay | Admitting: *Deleted

## 2014-11-26 ENCOUNTER — Encounter: Payer: Self-pay | Admitting: Internal Medicine

## 2014-11-26 ENCOUNTER — Ambulatory Visit (INDEPENDENT_AMBULATORY_CARE_PROVIDER_SITE_OTHER): Payer: Self-pay | Admitting: Internal Medicine

## 2014-11-26 VITALS — BP 112/60 | HR 71 | Ht 75.0 in | Wt 297.2 lb

## 2014-11-26 DIAGNOSIS — I2510 Atherosclerotic heart disease of native coronary artery without angina pectoris: Secondary | ICD-10-CM

## 2014-11-26 DIAGNOSIS — E785 Hyperlipidemia, unspecified: Secondary | ICD-10-CM

## 2014-11-26 DIAGNOSIS — I10 Essential (primary) hypertension: Secondary | ICD-10-CM

## 2014-11-26 DIAGNOSIS — E119 Type 2 diabetes mellitus without complications: Secondary | ICD-10-CM

## 2014-11-26 LAB — BASIC METABOLIC PANEL
BUN: 11 mg/dL (ref 6–23)
CO2: 22 mEq/L (ref 19–32)
Calcium: 8.9 mg/dL (ref 8.4–10.5)
Chloride: 105 mEq/L (ref 96–112)
Creatinine, Ser: 0.83 mg/dL (ref 0.40–1.50)
GFR: 102.54 mL/min (ref >60.00)
GLUCOSE: 233 mg/dL — AB (ref 70–99)
POTASSIUM: 3.8 meq/L (ref 3.5–5.1)
Sodium: 136 mEq/L (ref 135–145)

## 2014-11-26 LAB — CBC WITH DIFFERENTIAL/PLATELET
Basophils Absolute: 0.1 10*3/uL (ref 0.0–0.1)
Basophils Relative: 0.7 % (ref 0.0–3.0)
Eosinophils Absolute: 0.3 10*3/uL (ref 0.0–0.7)
Eosinophils Relative: 4.2 % (ref 0.0–5.0)
HEMATOCRIT: 34.3 % — AB (ref 39.0–52.0)
HEMOGLOBIN: 11.6 g/dL — AB (ref 13.0–17.0)
LYMPHS ABS: 2.5 10*3/uL (ref 0.7–4.0)
LYMPHS PCT: 30.6 % (ref 12.0–46.0)
MCHC: 33.7 g/dL (ref 30.0–36.0)
MCV: 89 fl (ref 78.0–100.0)
Monocytes Absolute: 0.6 10*3/uL (ref 0.1–1.0)
Monocytes Relative: 7.2 % (ref 3.0–12.0)
NEUTROS PCT: 57.3 % (ref 43.0–77.0)
Neutro Abs: 4.6 10*3/uL (ref 1.4–7.7)
Platelets: 237 10*3/uL (ref 150.0–400.0)
RBC: 3.86 Mil/uL — ABNORMAL LOW (ref 4.22–5.81)
RDW: 14.3 % (ref 11.5–15.5)
WBC: 8 10*3/uL (ref 4.0–10.5)

## 2014-11-26 LAB — LIPID PANEL
CHOL/HDL RATIO: 4
Cholesterol: 107 mg/dL (ref 0–200)
HDL: 27.5 mg/dL — ABNORMAL LOW (ref >39.00)
LDL Cholesterol: 59 mg/dL (ref 0–99)
NONHDL: 79.5
Triglycerides: 102 mg/dL (ref 0.0–149.0)
VLDL: 20.4 mg/dL (ref 0.0–40.0)

## 2014-11-26 LAB — HEPATIC FUNCTION PANEL
ALT: 14 U/L (ref 0–53)
AST: 10 U/L (ref 0–37)
Albumin: 3.4 g/dL — ABNORMAL LOW (ref 3.5–5.2)
Alkaline Phosphatase: 110 U/L (ref 39–117)
Bilirubin, Direct: 0.1 mg/dL (ref 0.0–0.3)
Total Bilirubin: 0.4 mg/dL (ref 0.2–1.2)
Total Protein: 6.6 g/dL (ref 6.0–8.3)

## 2014-11-26 LAB — HEMOGLOBIN A1C: HEMOGLOBIN A1C: 7.7 % — AB (ref 4.6–6.5)

## 2014-11-26 NOTE — Progress Notes (Signed)
Cardiology Office Note   Date:  11/26/2014   ID:  William Young, DOB 07-Feb-1961, MRN [id number]  PCP:  Lance Bosch, NP  Cardiologist:   Dorris Carnes, MD   No chief complaint on file. Pt presents for f/u of CAD   History of Present Illness: William Young is a 54 y.o. male who was recently admitted 5/2-5/4 with a non-STEMI. On the date of admission, he awoke with sudden onset chest pain. LHC demonstrated single-vessel CAD with 90% proximal RCA and 80% right posterior lateral branch #3 stenosis. He underwent PCI with a BMS to the proximal RCA and a resolute DES to the RPLB3. The patient was not placed on aspirin due to history of true allergy with anaphylaxis. Brilinta was recommended for a minimum of 1 year. Patient could be considered for conversion to Plavix 6-12 months post PCI. LV function was noted to be normal on left ventriculogram with inferior wall motion abnormality.  He returns for FU. He has been doing very well since DC. He met with the diabetic educator at his PCPs office today. He has changed his diet and stopped smoking. He is back at work and goes for walks. The patient denies chest pain, shortness of breath, syncope, orthopnea, PND or significant pedal edema.       Current Outpatient Prescriptions  Medication Sig Dispense Refill  . acetaminophen (TYLENOL) 500 MG tablet Take 1,000 mg by mouth every 6 (six) hours as needed for mild pain.    Marland Kitchen atorvastatin (LIPITOR) 80 MG tablet Take 1 tablet (80 mg total) by mouth daily at 6 PM. 30 tablet 11  . Blood Glucose Monitoring Suppl (TRUERESULT BLOOD GLUCOSE) W/DEVICE KIT 1 each by Does not apply route 2 (two) times daily. 1 each 0  . carvedilol (COREG) 12.5 MG tablet Take 1 tablet (12.5 mg total) by mouth 2 (two) times daily with a meal. 60 tablet 11  . glipiZIDE (GLUCOTROL) 5 MG tablet Take 1 tablet (5 mg total) by mouth 2 (two) times daily before a meal. 60 tablet 3  . glucose blood (TRUETEST TEST) test strip Use as  instructed 100 each 12  . Lancets 30G MISC 1 each by Does not apply route 2 (two) times daily. 100 each 6  . lisinopril (PRINIVIL,ZESTRIL) 5 MG tablet Take 1 tablet (5 mg total) by mouth daily. 30 tablet 3  . nitroGLYCERIN (NITROSTAT) 0.4 MG SL tablet Place 1 tablet (0.4 mg total) under the tongue every 5 (five) minutes x 3 doses as needed for chest pain. 25 tablet 12  . ranitidine (ZANTAC) 150 MG tablet Take 1 tablet (150 mg total) by mouth 2 (two) times daily. 60 tablet 2  . ticagrelor (BRILINTA) 90 MG TABS tablet Take 1 tablet (90 mg total) by mouth 2 (two) times daily. 60 tablet 11   No current facility-administered medications for this visit.    Allergies:   Aspirin and Ibuprofen   Past Medical History  Diagnosis Date  . GERD (gastroesophageal reflux disease)   . Tobacco use   . Hyperglycemia   . CAD (coronary artery disease) 09/2014    a. NSTEMI >> LHC 5/16:  mLAD 15, pRCA 90, mRCA 40, RPLB3 80, EF normal with inf HK >> PCI:  BMS to pRCA and DES to RPLB3   . Diabetes mellitus without complication   . HTN (hypertension)   . OSA (obstructive sleep apnea)   . History of echocardiogram     Echo 6/16:  EF 50-55%, no RWMA,  Gr 1 DD    Past Surgical History  Procedure Laterality Date  . None    . Cardiac catheterization N/A 09/11/2014    Procedure: Left Heart Cath and Coronary Angiography;  Surgeon: Leonie Man, MD;  Location: [facility] INVASIVE CV LAB CUPID;  Service: Cardiovascular;  Laterality: N/A;  . Cardiac catheterization  09/11/2014    Procedure: Coronary Stent Intervention;  Surgeon: Leonie Man, MD;  Location: [facility] INVASIVE CV LAB CUPID;  Service: Cardiovascular;;     Social History:  The patient  reports that he quit smoking about 2 months ago. His smoking use included Cigarettes. He has a 35 pack-year smoking history. He has never used smokeless tobacco. He reports that he drinks alcohol. He reports that he does not use illicit drugs.   Family History:  The patient's family  history includes Cancer in his maternal grandfather; Heart attack in his maternal grandmother, maternal uncle, and paternal grandmother. There is no history of Stroke.    ROS:  Please see the history of present illness. All other systems are reviewed and  Negative to the above problem except as noted.    PHYSICAL EXAM: VS:  BP 112/60 mmHg  Pulse 71  Ht '6\' 3"'  (1.905 m)  Wt 297 lb 3.2 oz (134.809 kg)  BMI 37.15 kg/m2  SpO2 95%  GEN: Well nourished, well developed, in no acute distress HEENT: normal Neck: no JVD, carotid bruits, or masses Cardiac: RRR; no murmurs, rubs, or gallops,no edema  Respiratory:  clear to auscultation bilaterally, normal work of breathing GI: soft, nontender, nondistended, + BS  No hepatomegaly  MS: no deformity Moving all extremities   Skin: warm and dry, no rash Neuro:  Strength and sensation are intact Psych: euthymic mood, full affect   EKG:  EKG is not ordered today.   Lipid Panel    Component Value Date/Time   CHOL 216* 09/10/2014 1522   TRIG 105 09/10/2014 1522   HDL 36* 09/10/2014 1522   CHOLHDL 6.0 09/10/2014 1522   VLDL 21 09/10/2014 1522   LDLCALC 159* 09/10/2014 1522      Wt Readings from Last 3 Encounters:  11/26/14 297 lb 3.2 oz (134.809 kg)  11/02/14 300 lb 3.2 oz (136.17 kg)  10/11/14 301 lb (136.533 kg)      ASSESSMENT AND PLAN:  1  CAD  Doing good  Keep on same regimen  Check labs  F/u November  2.  HL  Get lipid panel today  3.  Tob  Cutttnig back on cigs    4.  DM  Watching diet.  Will check A1C  On glipizide 5 mg    5  Constipation.  Recomm miralax

## 2014-11-26 NOTE — Patient Instructions (Signed)
Medication Instructions:  Your physician recommends that you continue on your current medications as directed. Please refer to the Current Medication list given to you today.   Labwork: Lab work to be done today--CBC, BMP, Lipid, Liver, Hg A1C  Testing/Procedures: none  Follow-Up: Your physician wants you to follow-up in: November.  You will receive a reminder letter in the mail two months in advance. If you don't receive a letter, please call our office to schedule the follow-up appointment.

## 2014-11-27 ENCOUNTER — Telehealth: Payer: Self-pay | Admitting: *Deleted

## 2014-11-27 NOTE — Telephone Encounter (Signed)
Lvm with woman who answered phone to please have ptcb for results.

## 2014-11-29 NOTE — Telephone Encounter (Signed)
Pt notified of lab results by phone with verbal understanding.  

## 2014-11-29 NOTE — Telephone Encounter (Signed)
lmptcb x 2 to go over results 

## 2014-12-26 ENCOUNTER — Other Ambulatory Visit: Payer: Self-pay | Admitting: Family Medicine

## 2014-12-26 DIAGNOSIS — G4733 Obstructive sleep apnea (adult) (pediatric): Secondary | ICD-10-CM

## 2015-01-04 ENCOUNTER — Ambulatory Visit: Payer: Self-pay | Attending: Internal Medicine | Admitting: Internal Medicine

## 2015-01-04 ENCOUNTER — Encounter: Payer: Self-pay | Admitting: Internal Medicine

## 2015-01-04 VITALS — BP 132/88 | HR 71 | Temp 98.8°F | Resp 16 | Ht 76.0 in | Wt 291.0 lb

## 2015-01-04 DIAGNOSIS — E119 Type 2 diabetes mellitus without complications: Secondary | ICD-10-CM | POA: Insufficient documentation

## 2015-01-04 DIAGNOSIS — I2510 Atherosclerotic heart disease of native coronary artery without angina pectoris: Secondary | ICD-10-CM | POA: Insufficient documentation

## 2015-01-04 DIAGNOSIS — Z1211 Encounter for screening for malignant neoplasm of colon: Secondary | ICD-10-CM | POA: Insufficient documentation

## 2015-01-04 DIAGNOSIS — Z23 Encounter for immunization: Secondary | ICD-10-CM | POA: Insufficient documentation

## 2015-01-04 LAB — GLUCOSE, POCT (MANUAL RESULT ENTRY): POC Glucose: 179 mg/dl — AB (ref 70–99)

## 2015-01-04 MED ORDER — TICAGRELOR 90 MG PO TABS: 90.0000 mg | ORAL_TABLET | Freq: Two times a day (BID) | ORAL | Status: DC

## 2015-01-04 MED ORDER — TETANUS-DIPHTH-ACELL PERTUSSIS 5-2.5-18.5 LF-MCG/0.5 IM SUSP
0.5000 mL | Freq: Once | INTRAMUSCULAR | Status: AC
  Administered 2015-01-04: 0.5 mL via INTRAMUSCULAR

## 2015-01-04 NOTE — Patient Instructions (Signed)
Come back November instead so we can allow for time for changes and your A1C to go down.   Do not forget to come and get Flu shot or just get it when you return for follow up

## 2015-01-04 NOTE — Progress Notes (Signed)
Patient states he is here for a routine follow up On his diabetes and hypertension Patient does not need refills at this time

## 2015-01-04 NOTE — Progress Notes (Signed)
Patient ID: William Young, male   DOB: [birth year], 54 y.o.   MRN: [id number] SUBJECTIVE: 54 y.o. male for follow up of diabetes and hypertension. Diabetic Review of Systems - medication compliance: compliant all of the time, diabetic diet compliance: compliant most of the time, home glucose monitoring: is performed regularly, fasting values range 88-182, further diabetic ROS: no polyuria or polydipsia, no chest pain, dyspnea or TIA's, no numbness, tingling or pain in extremities, no hypoglycemia.  Other symptoms and concerns: none.  Current Outpatient Prescriptions  Medication Sig Dispense Refill  . acetaminophen (TYLENOL) 500 MG tablet Take 1,000 mg by mouth every 6 (six) hours as needed for mild pain.    Marland Kitchen atorvastatin (LIPITOR) 80 MG tablet Take 1 tablet (80 mg total) by mouth daily at 6 PM. 30 tablet 11  . carvedilol (COREG) 12.5 MG tablet Take 1 tablet (12.5 mg total) by mouth 2 (two) times daily with a meal. 60 tablet 11  . glipiZIDE (GLUCOTROL) 5 MG tablet Take 1 tablet (5 mg total) by mouth 2 (two) times daily before a meal. 60 tablet 3  . lisinopril (PRINIVIL,ZESTRIL) 5 MG tablet Take 1 tablet (5 mg total) by mouth daily. 30 tablet 3  . nitroGLYCERIN (NITROSTAT) 0.4 MG SL tablet Place 1 tablet (0.4 mg total) under the tongue every 5 (five) minutes x 3 doses as needed for chest pain. 25 tablet 12  . ticagrelor (BRILINTA) 90 MG TABS tablet Take 1 tablet (90 mg total) by mouth 2 (two) times daily. 60 tablet 11  . Blood Glucose Monitoring Suppl (TRUERESULT BLOOD GLUCOSE) W/DEVICE KIT 1 each by Does not apply route 2 (two) times daily. 1 each 0  . glucose blood (TRUETEST TEST) test strip Use as instructed 100 each 12  . Lancets 30G MISC 1 each by Does not apply route 2 (two) times daily. 100 each 6  . ranitidine (ZANTAC) 150 MG tablet Take 1 tablet (150 mg total) by mouth 2 (two) times daily. (Patient not taking: Reported on 01/04/2015) 60 tablet 2   No current facility-administered medications  for this visit.    OBJECTIVE: Appearance: alert, well appearing, and in no distress, oriented to person, place, and time and overweight. BP 132/88 mmHg  Pulse 71  Temp(Src) 98.8 F (37.1 C)  Resp 16  Ht '6\' 4"'  (1.93 m)  Wt 291 lb (131.997 kg)  BMI 35.44 kg/m2  SpO2 99%  Exam: heart sounds normal rate, regular rhythm, normal S1, S2, no murmurs, rubs, clicks or gallops, no JVD, chest clear, no carotid bruits, feet: warm, good capillary refill, no trophic changes or ulcerative lesions and normal DP and PT pulses  ASSESSMENT: William Young was seen today for follow-up.  Diagnoses and all orders for this visit:  Type 2 diabetes mellitus without complication -     Glucose (CBG) -     Ambulatory referral to Podiatry Patients diabetes is well control as evidence by consistently low a1c.  Patient will continue with current therapy and continue to make necessary lifestyle changes.  Reviewed foot care, diet, exercise, annual health maintenance with patient.   Coronary artery disease involving native coronary artery of native heart without angina pectoris -    Refill ticagrelor (BRILINTA) 90 MG TABS tablet; Take 1 tablet (90 mg total) by mouth 2 (two) times daily. Continue Lipitor. Low fat diet revisited today. Last LDL was 11/2014 which was 59 and at goal.  Colon cancer screening -     Ambulatory referral to Gastroenterology--need colonoscopy  Need for  Tdap vaccination -     Tdap (BOOSTRIX) injection 0.5 mL; Inject 0.5 mLs into the muscle once given in office   Return for November for DM f/u.  Lance Bosch, NP 01/23/2015 10:31 AM

## 2015-01-28 ENCOUNTER — Ambulatory Visit (HOSPITAL_BASED_OUTPATIENT_CLINIC_OR_DEPARTMENT_OTHER): Payer: Medicaid Other | Attending: Family Medicine

## 2015-01-28 DIAGNOSIS — R0683 Snoring: Secondary | ICD-10-CM | POA: Diagnosis not present

## 2015-01-28 DIAGNOSIS — R5383 Other fatigue: Secondary | ICD-10-CM | POA: Insufficient documentation

## 2015-01-28 DIAGNOSIS — G4733 Obstructive sleep apnea (adult) (pediatric): Secondary | ICD-10-CM | POA: Diagnosis not present

## 2015-02-03 ENCOUNTER — Encounter (HOSPITAL_BASED_OUTPATIENT_CLINIC_OR_DEPARTMENT_OTHER): Payer: Self-pay | Admitting: Internal Medicine

## 2015-02-03 DIAGNOSIS — G4733 Obstructive sleep apnea (adult) (pediatric): Secondary | ICD-10-CM

## 2015-02-03 NOTE — Progress Notes (Signed)
   Patient Name: William Young, William Young Date: 01/28/2015 Gender: Male D.O.B: 1960/10/31 Age (years): 33 Referring Provider: Jaclyn Shaggy Height (inches): 75 Interpreting Physician: Jetty Duhamel MD, ABSM Weight (lbs): 288 RPSGT: Melburn Popper BMI: 36 MRN: [id number] Neck Size: 18.50 CLINICAL INFORMATION Sleep Study Type: NPSG  Indication for sleep study: Daytime Fatigue, Fatigue, Snoring  Epworth Sleepiness Score: 4  SLEEP STUDY TECHNIQUE As per the AASM Manual for the Scoring of Sleep and Associated Events v2.3 (April 2016) with a hypopnea requiring 4% desaturations.  The channels recorded and monitored were frontal, central and occipital EEG, electrooculogram (EOG), submentalis EMG (chin), nasal and oral airflow, thoracic and abdominal wall motion, anterior tibialis EMG, snore microphone, electrocardiogram, and pulse oximetry.  MEDICATIONS Patient's medications include: Charted for review. Medications self-administered by patient during sleep study : No sleep medicine administered.  SLEEP ARCHITECTURE The study was initiated at 11:02:34 PM and ended at 5:09:52 AM.  Sleep onset time was 20.4 minutes and the sleep efficiency was 83.6%. The total sleep time was 306.9 minutes.  Stage REM latency was 132.5 minutes.  The patient spent 5.86% of the night in stage N1 sleep, 78.50% in stage N2 sleep, 0.00% in stage N3 and 15.64% in REM.  Alpha intrusion was absent.  Supine sleep was 0.00%.  Wake after sleep onset 40 minutes  RESPIRATORY PARAMETERS The overall apnea/hypopnea index (AHI) was 9.2 per hour. There were 2 total apneas, including 1 obstructive, 1 central and 0 mixed apneas. There were 45 hypopneas and 20 RERAs.  The AHI during Stage REM sleep was 38.8 per hour.  AHI while supine was N/A per hour.  The mean oxygen saturation was 95.25%. The minimum SpO2 during sleep was 90.00%.  Loud snoring was noted during this study.  CARDIAC DATA The 2 lead EKG  demonstrated sinus rhythm. The mean heart rate was 59.97 beats per minute. Other EKG findings include: None.  LEG MOVEMENT DATA The total PLMS were 0 with a resulting PLMS index of 0.00. Associated arousal with leg movement index was 0.0 .  IMPRESSIONS Mild obstructive sleep apnea occurred during this study (AHI = 9.2/h). No significant central sleep apnea occurred during this study (CAI = 0.2/h). The patient had minimal or no oxygen desaturation during the study (Min O2 = 90.00%) The patient snored with Loud snoring volume. No cardiac abnormalities were noted during this study. Clinically significant periodic limb movements did not occur during sleep. No significant associated arousals.   DIAGNOSIS Obstructive Sleep Apnea (327.23 [G47.33 ICD-10])   RECOMMENDATIONS Therapeutic CPAP titration to determine optimal pressure required to alleviate sleep disordered breathing. CPAP would be favored because of the cardiac history, but an oral appliance might also be an option. Avoid alcohol, sedatives and other CNS depressants that may worsen sleep apnea and disrupt normal sleep architecture. Sleep hygiene should be reviewed to assess factors that may improve sleep quality. Weight management and regular exercise should be initiated or continued if appropriate.  Waymon Budge Diplomate, American Board of Sleep Medicine  ELECTRONICALLY SIGNED ON:  02/03/2015, 10:03 AM [facility] PH: [phone]   FX: [phone] ACCREDITED BY THE AMERICAN ACADEMY OF SLEEP MEDICINE

## 2015-02-19 ENCOUNTER — Telehealth: Payer: Self-pay

## 2015-02-19 NOTE — Telephone Encounter (Signed)
Called patient  Patient not available Left message to return our call

## 2015-03-18 ENCOUNTER — Other Ambulatory Visit: Payer: Self-pay | Admitting: Family Medicine

## 2015-03-28 ENCOUNTER — Ambulatory Visit (INDEPENDENT_AMBULATORY_CARE_PROVIDER_SITE_OTHER): Payer: Medicaid Other | Admitting: Internal Medicine

## 2015-03-28 ENCOUNTER — Encounter: Payer: Self-pay | Admitting: Internal Medicine

## 2015-03-28 VITALS — BP 110/72 | HR 73 | Ht 76.0 in | Wt 296.0 lb

## 2015-03-28 DIAGNOSIS — I2510 Atherosclerotic heart disease of native coronary artery without angina pectoris: Secondary | ICD-10-CM | POA: Diagnosis not present

## 2015-03-28 MED ORDER — TICAGRELOR 90 MG PO TABS: 90.0000 mg | ORAL_TABLET | Freq: Two times a day (BID) | ORAL | Status: DC

## 2015-03-28 MED ORDER — LISINOPRIL 5 MG PO TABS: 5.0000 mg | ORAL_TABLET | Freq: Every day | ORAL | Status: DC

## 2015-03-28 NOTE — Progress Notes (Signed)
Cardiology Office Note   Date:  03/28/2015   ID:  Kannan Proia, DOB 1960-07-17, MRN [id number]  PCP:  Lance Bosch, NP  Cardiologist:   Dorris Carnes, MD   No chief complaint on file.  F/U of CAD     History of Present Illness: William Young is a 54 y.o. male with a history of  admitted 5/2-5/4 with a non-STEMI. On the date of admission, he awoke with sudden onset chest pain. LHC demonstrated single-vessel CAD with 90% proximal RCA and 80% right posterior lateral branch #3 stenosis. He underwent PCI with a BMS to the proximal RCA and a resolute DES to the RPLB3. The patient was not placed on aspirin due to history of true allergy with anaphylaxis. Brilinta was recommended for a minimum of 1 year. Patient could be considered for conversion to Plavix 6-12 months post PCI. LV function was noted to be normal on left ventriculogram with inferior wall motion abnormality  Pt has a cold  Otherwise has been doing good  No CP  Breahting was OK    Current Outpatient Prescriptions  Medication Sig Dispense Refill  . acetaminophen (TYLENOL) 500 MG tablet Take 1,000 mg by mouth every 6 (six) hours as needed for mild pain.    Marland Kitchen atorvastatin (LIPITOR) 80 MG tablet Take 1 tablet (80 mg total) by mouth daily at 6 PM. 30 tablet 11  . Blood Glucose Monitoring Suppl (TRUERESULT BLOOD GLUCOSE) W/DEVICE KIT 1 each by Does not apply route 2 (two) times daily. 1 each 0  . carvedilol (COREG) 12.5 MG tablet Take 1 tablet (12.5 mg total) by mouth 2 (two) times daily with a meal. 60 tablet 11  . glipiZIDE (GLUCOTROL) 5 MG tablet Take 1 tablet (5 mg total) by mouth 2 (two) times daily before a meal. 60 tablet 3  . glucose blood (TRUETEST TEST) test strip Use as instructed 100 each 12  . Lancets 30G MISC 1 each by Does not apply route 2 (two) times daily. 100 each 6  . lisinopril (PRINIVIL,ZESTRIL) 5 MG tablet TAKE 1 TABLET BY MOUTH DAILY. 30 tablet 3  . nitroGLYCERIN (NITROSTAT) 0.4 MG SL tablet Place 1 tablet  (0.4 mg total) under the tongue every 5 (five) minutes x 3 doses as needed for chest pain. 25 tablet 12  . ranitidine (ZANTAC) 150 MG tablet Take 1 tablet (150 mg total) by mouth 2 (two) times daily. 60 tablet 2  . ticagrelor (BRILINTA) 90 MG TABS tablet Take 1 tablet (90 mg total) by mouth 2 (two) times daily. 60 tablet 11   No current facility-administered medications for this visit.    Allergies:   Aspirin and Ibuprofen   Past Medical History  Diagnosis Date  . GERD (gastroesophageal reflux disease)   . Tobacco use   . Hyperglycemia   . CAD (coronary artery disease) 09/2014    a. NSTEMI >> LHC 5/16:  mLAD 15, pRCA 90, mRCA 40, RPLB3 80, EF normal with inf HK >> PCI:  BMS to pRCA and DES to RPLB3   . Diabetes mellitus without complication ([place])   . HTN (hypertension)   . OSA (obstructive sleep apnea)   . History of echocardiogram     Echo 6/16:  EF 50-55%, no RWMA, Gr 1 DD    Past Surgical History  Procedure Laterality Date  . None    . Cardiac catheterization N/A 09/11/2014    Procedure: Left Heart Cath and Coronary Angiography;  Surgeon: Leonie Man, MD;  Location: [place] INVASIVE CV LAB CUPID;  Service: Cardiovascular;  Laterality: N/A;  . Cardiac catheterization  09/11/2014    Procedure: Coronary Stent Intervention;  Surgeon: Leonie Man, MD;  Location: [facility] INVASIVE CV LAB CUPID;  Service: Cardiovascular;;     Social History:  The patient  reports that he has been smoking Cigarettes.  He has a 35 pack-year smoking history. He has never used smokeless tobacco. He reports that he drinks alcohol. He reports that he does not use illicit drugs.   Family History:  The patient's family history includes Cancer in his maternal grandfather; Heart attack in his maternal grandmother, maternal uncle, and paternal grandmother. There is no history of Stroke or Hypertension.    ROS:  Please see the history of present illness. All other systems are reviewed and  Negative to the above problem  except as noted.    PHYSICAL EXAM: VS:  BP 110/72 mmHg  Pulse 73  Ht _0  (1.93 m)  Wt 134.265 kg (296 lb)  BMI 36.05 kg/m2  SpO2 98%  GEN: Well nourished, well developed, in no acute distress HEENT: normal Neck: no JVD, carotid bruits, or masses Cardiac: RRR; no murmurs, rubs, or gallops,no edema  Respiratory:  clear to auscultation bilaterally, normal work of breathing GI: soft, nontender, nondistended, + BS  No hepatomegaly  MS: no deformity Moving all extremities   Skin: warm and dry, no rash Neuro:  Strength and sensation are intact Psych: euthymic mood, full affect   EKG:  EKG is not ordered today.   Lipid Panel    Component Value Date/Time   CHOL 107 11/26/2014 0759   TRIG 102.0 11/26/2014 0759   HDL 27.50* 11/26/2014 0759   CHOLHDL 4 11/26/2014 0759   VLDL 20.4 11/26/2014 0759   LDLCALC 59 11/26/2014 0759      Wt Readings from Last 3 Encounters:  03/28/15 134.265 kg (296 lb)  01/04/15 131.997 kg (291 lb)  11/26/14 134.809 kg (297 lb 3.2 oz)      ASSESSMENT AND PLAN:  1  CAD  No symptosm to suggest angina.   F/U in April  2.  HL Lipids are good  LDL 59  3.  Tob  Counselled  Stil smoking about 1/2 ppd    4. URI  Sounds viral  Again discussed smoking  I would a void nyquil  Take robitussen and benadry    5.  Sleep apena  Study shows mild apnea with no signif desat  Will review with T Turner    Signed, Dorris Carnes, MD  03/28/2015 8:53 AM    [facility] [place], [place], [place]  [address] Phone: [phone]; Fax: [phone]

## 2015-03-28 NOTE — Patient Instructions (Signed)
Your physician recommends that you continue on your current medications as directed. Please refer to the Current Medication list given to you today. Your physician wants you to follow-up in: 5-6 MONTHS WITH DR. Tenny CrawOSS.  You will receive a reminder letter in the mail two months in advance. If you don't receive a letter, please call our office to schedule the follow-up appointment.

## 2015-04-02 ENCOUNTER — Telehealth: Payer: Self-pay | Admitting: *Deleted

## 2015-04-02 ENCOUNTER — Encounter: Payer: Self-pay | Admitting: *Deleted

## 2015-04-02 ENCOUNTER — Other Ambulatory Visit: Payer: Self-pay | Admitting: Internal Medicine

## 2015-04-02 DIAGNOSIS — G4733 Obstructive sleep apnea (adult) (pediatric): Secondary | ICD-10-CM

## 2015-04-02 NOTE — Telephone Encounter (Signed)
Patient is aware of sleep study results and is okay to proceed with titration.  Will schedule and mail a letter confirming date.

## 2015-04-02 NOTE — Addendum Note (Signed)
Addended by: Arcola JanskyOOK, BETHANY M on: 04/02/2015 09:56 AM   Modules accepted: Orders

## 2015-04-02 NOTE — Telephone Encounter (Signed)
Left message for patient to call me.  This is in regards to setting up a CPAP titration per Dr Arliss Journeyoss/Turner

## 2015-04-03 ENCOUNTER — Other Ambulatory Visit: Payer: Self-pay | Admitting: Internal Medicine

## 2015-04-03 DIAGNOSIS — I2510 Atherosclerotic heart disease of native coronary artery without angina pectoris: Secondary | ICD-10-CM

## 2015-04-03 MED ORDER — TICAGRELOR 90 MG PO TABS: 90.0000 mg | ORAL_TABLET | Freq: Two times a day (BID) | ORAL | Status: DC

## 2015-04-09 ENCOUNTER — Telehealth: Payer: Self-pay | Admitting: Internal Medicine

## 2015-04-09 NOTE — Telephone Encounter (Signed)
Called patient about his Brilinta. Informed him that we didn't have any samples today, so offered patient a discount card that will [facility] day supply. Patient [facility], but they don't have any supply of Brilinta at this time. Patient wants to know if there is another medication he can take beside Brilinta. Will forward to Dr. Tenny Crawoss for advisement.

## 2015-04-09 NOTE — Telephone Encounter (Signed)
New Message    Pt calling stating that he asked about samples of Brilinta and was told we don't have any. Pt states he has been off of it for a week now and wants to know if there is anything else that our office can give him. Please call back and advise.

## 2015-04-09 NOTE — Telephone Encounter (Signed)
Patient calling the office for samples of medication:   1.  What medication and dosage are you requesting samples for?Brinlanta-pt don't know mg  2.  Are you currently out of this medication? Yes has been out for week

## 2015-04-09 NOTE — Telephone Encounter (Signed)
called and left VM that we do not have samples.

## 2015-04-09 NOTE — Telephone Encounter (Signed)
If unable to get Brilinta could switch to Plavix 75 mg per day  He has completed 6 months (optimally would try to get 12 but need coverage)

## 2015-04-10 NOTE — Telephone Encounter (Signed)
Called patient about his Brilinta. Our office has a couple of samples of Brilinta. Patient will pick-up at front desk. Informed patient if he is [facility], then he can switch to Plavix. Patient will let our office know if he runs out of Brilinta and unable to have it [facility]. Patient also received a discount card for Brilinta and will use it as well.  Patient verbalized understanding.

## 2015-05-09 ENCOUNTER — Telehealth: Payer: Self-pay | Admitting: Internal Medicine

## 2015-05-09 NOTE — Telephone Encounter (Signed)
Patient called back.  Stated that the 15 min daily sob is in the mornings for the last week.  Says after he gets up in the morning he will have a coughing spell and develop sob which resolves in about 15 mins.  Denies cp or dizziness when these instances occur.  Stated that he has been using a leaf blower for the last week, which is the same duration of his sob instances.  Still smoker. Has sleep apnea and has an appt in January for C-PAP fitting.  I suggested that he try an otc antihistamine to see if this will stop his coughing in the mornings.  Advised to seek medical help if his sob gets worse.

## 2015-05-09 NOTE — Telephone Encounter (Signed)
Called Pt, message that the "mailbox is full"  Couldn't leave message.

## 2015-05-09 NOTE — Telephone Encounter (Signed)
New Message  Pt c/o Shortness Of Breath: STAT if SOB developed within the last 24 hours or pt is noticeably SOB on the phone  1. Are you currently SOB (can you hear that pt is SOB on the phone)? No  2. How long have you been experiencing SOB? Every day for the last 1 week. For about 15 minutes each day  3. Are you SOB when sitting or when up moving around? Both  4.  Are you currently experiencing any other symptoms? Stress. And lack of sleep.

## 2015-05-15 ENCOUNTER — Emergency Department (HOSPITAL_COMMUNITY): Payer: Medicaid Other

## 2015-05-15 ENCOUNTER — Emergency Department (HOSPITAL_COMMUNITY)
Admission: EM | Admit: 2015-05-15 | Discharge: 2015-05-15 | Disposition: A | Discharge status: Home / Self Care | Payer: Medicaid Other | Attending: Emergency Medicine | Admitting: Emergency Medicine

## 2015-05-15 ENCOUNTER — Encounter (HOSPITAL_COMMUNITY): Payer: Self-pay | Admitting: Emergency Medicine

## 2015-05-15 DIAGNOSIS — K219 Gastro-esophageal reflux disease without esophagitis: Secondary | ICD-10-CM | POA: Insufficient documentation

## 2015-05-15 DIAGNOSIS — Z9889 Other specified postprocedural states: Secondary | ICD-10-CM | POA: Insufficient documentation

## 2015-05-15 DIAGNOSIS — Z8669 Personal history of other diseases of the nervous system and sense organs: Secondary | ICD-10-CM | POA: Insufficient documentation

## 2015-05-15 DIAGNOSIS — D849 Immunodeficiency, unspecified: Secondary | ICD-10-CM | POA: Insufficient documentation

## 2015-05-15 DIAGNOSIS — J209 Acute bronchitis, unspecified: Secondary | ICD-10-CM

## 2015-05-15 DIAGNOSIS — R319 Hematuria, unspecified: Secondary | ICD-10-CM | POA: Insufficient documentation

## 2015-05-15 DIAGNOSIS — I10 Essential (primary) hypertension: Secondary | ICD-10-CM | POA: Insufficient documentation

## 2015-05-15 DIAGNOSIS — I2510 Atherosclerotic heart disease of native coronary artery without angina pectoris: Secondary | ICD-10-CM | POA: Insufficient documentation

## 2015-05-15 DIAGNOSIS — E119 Type 2 diabetes mellitus without complications: Secondary | ICD-10-CM | POA: Insufficient documentation

## 2015-05-15 DIAGNOSIS — F17210 Nicotine dependence, cigarettes, uncomplicated: Secondary | ICD-10-CM | POA: Insufficient documentation

## 2015-05-15 DIAGNOSIS — R109 Unspecified abdominal pain: Secondary | ICD-10-CM | POA: Insufficient documentation

## 2015-05-15 DIAGNOSIS — Z79899 Other long term (current) drug therapy: Secondary | ICD-10-CM | POA: Insufficient documentation

## 2015-05-15 LAB — CBC
HCT: 43.2 % (ref 39.0–52.0)
Hemoglobin: 14.3 g/dL (ref 13.0–17.0)
MCH: 31.6 pg (ref 26.0–34.0)
MCHC: 33.1 g/dL (ref 30.0–36.0)
MCV: 95.4 fL (ref 78.0–100.0)
Platelets: 137 10*3/uL — ABNORMAL LOW (ref 150–400)
RBC: 4.53 MIL/uL (ref 4.22–5.81)
RDW: 14 % (ref 11.5–15.5)
WBC: 5.5 10*3/uL (ref 4.0–10.5)

## 2015-05-15 LAB — URINALYSIS, ROUTINE W REFLEX MICROSCOPIC
Glucose, UA: NEGATIVE mg/dL
Hgb urine dipstick: NEGATIVE
Ketones, ur: NEGATIVE mg/dL
Leukocytes, UA: NEGATIVE
Nitrite: NEGATIVE
Protein, ur: 30 mg/dL — AB
Specific Gravity, Urine: 1.031 — ABNORMAL HIGH (ref 1.005–1.030)
pH: 5.5 (ref 5.0–8.0)

## 2015-05-15 LAB — COMPREHENSIVE METABOLIC PANEL WITH GFR
ALT: 27 U/L (ref 17–63)
AST: 28 U/L (ref 15–41)
Albumin: 3.9 g/dL (ref 3.5–5.0)
Alkaline Phosphatase: 86 U/L (ref 38–126)
Anion gap: 9 (ref 5–15)
BUN: 13 mg/dL (ref 6–20)
CO2: 24 mmol/L (ref 22–32)
Calcium: 8.9 mg/dL (ref 8.9–10.3)
Chloride: 104 mmol/L (ref 101–111)
Creatinine, Ser: 1.07 mg/dL (ref 0.61–1.24)
GFR calc Af Amer: >60 mL/min
GFR calc non Af Amer: >60 mL/min
Glucose, Bld: 182 mg/dL — ABNORMAL HIGH (ref 65–99)
Potassium: 4.7 mmol/L (ref 3.5–5.1)
Sodium: 137 mmol/L (ref 135–145)
Total Bilirubin: 1.1 mg/dL (ref 0.3–1.2)
Total Protein: 7.3 g/dL (ref 6.5–8.1)

## 2015-05-15 LAB — URINE MICROSCOPIC-ADD ON: SQUAMOUS EPITHELIAL / LPF: NONE SEEN

## 2015-05-15 LAB — LIPASE, BLOOD: LIPASE: 23 U/L (ref 11–51)

## 2015-05-15 MED ORDER — AEROCHAMBER PLUS W/MASK MISC
1.0000 | Freq: Once | Status: AC
  Administered 2015-05-15: 1
  Filled 2015-05-15: qty 1

## 2015-05-15 MED ORDER — ALBUTEROL SULFATE HFA 108 (90 BASE) MCG/ACT IN AERS
2.0000 | INHALATION_SPRAY | RESPIRATORY_TRACT | Status: DC | PRN
  Administered 2015-05-15: 2 via RESPIRATORY_TRACT
  Filled 2015-05-15: qty 6.7

## 2015-05-15 NOTE — ED Notes (Addendum)
Pt c/o congested cough causing pain in upper abdomen x 3 days, hematuria yesterday, and constant R flank pain x 1 month.  Sts flank pain increases w/ palpation.  Pt reports having MI last year.

## 2015-05-15 NOTE — ED Provider Notes (Addendum)
CSN: [id number]     Arrival date & time 05/15/15  1013 History   First MD Initiated Contact with Patient 05/15/15 1323     Chief Complaint  Patient presents with  . Cough  . Hematuria     (Consider location/radiation/quality/duration/timing/severity/associated sxs/prior Treatment) HPI Complains of nonproductive cough for the past 3 days accompanied by diffuse chest pain, worse with coughing, improved with remaining still. He also presents with flank pain, right-sided nonradiating 1 month. He reports he had 2 episodes of hematuria yesterday. He denies abdominal pain maximum temperature was 100.8 yesterday. No treatment prior to coming here. No other associated symptoms. No flank pain presently Past Medical History  Diagnosis Date  . GERD (gastroesophageal reflux disease)   . Tobacco use   . Hyperglycemia   . CAD (coronary artery disease) 09/2014    a. NSTEMI >> LHC 5/16:  mLAD 15, pRCA 90, mRCA 40, RPLB3 80, EF normal with inf HK >> PCI:  BMS to pRCA and DES to RPLB3   . Diabetes mellitus without complication ([place])   . HTN (hypertension)   . OSA (obstructive sleep apnea)   . History of echocardiogram     Echo 6/16:  EF 50-55%, no RWMA, Gr 1 DD   Past Surgical History  Procedure Laterality Date  . None    . Cardiac catheterization N/A 09/11/2014    Procedure: Left Heart Cath and Coronary Angiography;  Surgeon: Leonie Man, MD;  Location: [facility] INVASIVE CV LAB CUPID;  Service: Cardiovascular;  Laterality: N/A;  . Cardiac catheterization  09/11/2014    Procedure: Coronary Stent Intervention;  Surgeon: Leonie Man, MD;  Location: [facility] INVASIVE CV LAB CUPID;  Service: Cardiovascular;;   Family History  Problem Relation Age of Onset  . Heart attack Maternal Uncle   . Heart attack Paternal Grandmother   . Cancer Maternal Grandfather   . Heart attack Maternal Grandmother   . Stroke Neg Hx   . Hypertension Neg Hx    Social History  Substance Use Topics  . Smoking status: Current  Every Day Smoker -- 1.00 packs/day for 35 years    Types: Cigarettes    Last Attempt to Quit: 09/10/2014  . Smokeless tobacco: Never Used  . Alcohol Use: 0.0 oz/week    0 Standard drinks or equivalent per week     Comment: occassional    Review of Systems  Constitutional: Positive for fever.  Respiratory: Positive for cough.   Cardiovascular: Positive for chest pain.       Chest pain with coughing  Genitourinary: Positive for hematuria and flank pain.  Allergic/Immunologic: Positive for immunocompromised state.       Diabetic  All other systems reviewed and are negative.     Allergies  Aspirin and Ibuprofen  Home Medications   Prior to Admission medications   Medication Sig Start Date End Date Taking? Authorizing Provider  acetaminophen (TYLENOL) 500 MG tablet Take 1,000 mg by mouth every 6 (six) hours as needed for mild pain.    Historical Provider, MD  atorvastatin (LIPITOR) 80 MG tablet Take 1 tablet (80 mg total) by mouth daily at 6 PM. 09/12/14   Brett Canales, PA-C  Blood Glucose Monitoring Suppl (TRUERESULT BLOOD GLUCOSE) W/DEVICE KIT 1 each by Does not apply route 2 (two) times daily. 09/27/14   Arnoldo Morale, MD  carvedilol (COREG) 12.5 MG tablet Take 1 tablet (12.5 mg total) by mouth 2 (two) times daily with a meal. 09/12/14   Brett Canales,  PA-C  glipiZIDE (GLUCOTROL) 5 MG tablet Take 1 tablet (5 mg total) by mouth 2 (two) times daily before a meal. 10/11/14   Lance Bosch, NP  glucose blood (TRUETEST TEST) test strip Use as instructed 09/27/14   Arnoldo Morale, MD  Lancets 30G MISC 1 each by Does not apply route 2 (two) times daily. 09/27/14   Arnoldo Morale, MD  lisinopril (PRINIVIL,ZESTRIL) 5 MG tablet Take 1 tablet (5 mg total) by mouth daily. 03/28/15   Fay Records, MD  nitroGLYCERIN (NITROSTAT) 0.4 MG SL tablet Place 1 tablet (0.4 mg total) under the tongue every 5 (five) minutes x 3 doses as needed for chest pain. 09/12/14   Brett Canales, PA-C  ranitidine (ZANTAC) 150 MG  tablet Take 1 tablet (150 mg total) by mouth 2 (two) times daily. 09/14/14   Arnoldo Morale, MD  ticagrelor (BRILINTA) 90 MG TABS tablet Take 1 tablet (90 mg total) by mouth 2 (two) times daily. 04/03/15   Tresa Garter, MD   BP 139/92 mmHg  Pulse 85  Temp(Src) 98 F (36.7 C) (Oral)  Resp 16  SpO2 96% Physical Exam  Constitutional: He appears well-developed and well-nourished.  HENT:  Head: Normocephalic and atraumatic.  Eyes: Conjunctivae are normal. Pupils are equal, round, and reactive to light.  Neck: Neck supple. No tracheal deviation present. No thyromegaly present.  Cardiovascular: Normal rate and regular rhythm.   No murmur heard. Pulmonary/Chest: Effort normal and breath sounds normal.  Abdominal: Soft. Bowel sounds are normal. He exhibits no distension. There is no tenderness.  Obese  Genitourinary: Penis normal.  Mildly tender right flank  Musculoskeletal: Normal range of motion. He exhibits no edema or tenderness.  Neurological: He is alert. Coordination normal.  Skin: Skin is warm and dry. No rash noted.  Psychiatric: He has a normal mood and affect.  Nursing note and vitals reviewed.   ED Course  Procedures (including critical care time) Labs Review Labs Reviewed  COMPREHENSIVE METABOLIC PANEL - Abnormal; Notable for the following:    Glucose, Bld 182 (*)    All other components within normal limits  CBC - Abnormal; Notable for the following:    Platelets 137 (*)    All other components within normal limits  URINALYSIS, ROUTINE W REFLEX MICROSCOPIC (NOT AT [facility]) - Abnormal; Notable for the following:    Color, Urine AMBER (*)    Specific Gravity, Urine 1.031 (*)    Bilirubin Urine SMALL (*)    Protein, ur 30 (*)    All other components within normal limits  URINE MICROSCOPIC-ADD ON - Abnormal; Notable for the following:    Bacteria, UA RARE (*)    Casts HYALINE CASTS (*)    All other components within normal limits  LIPASE, BLOOD    Imaging  Review No results found. I have personally reviewed and evaluated these images and lab results as part of my medical decision-making.   EKG Interpretation None     xrays viewed by me 3:55 PM patient resting comfortably. Results for orders placed or performed during the hospital encounter of 05/15/15  Lipase, blood  Result Value Ref Range   Lipase 23 11 - 51 U/L  Comprehensive metabolic panel  Result Value Ref Range   Sodium 137 135 - 145 mmol/L   Potassium 4.7 3.5 - 5.1 mmol/L   Chloride 104 101 - 111 mmol/L   CO2 24 22 - 32 mmol/L   Glucose, Bld 182 (H) 65 - 99 mg/dL  BUN 13 6 - 20 mg/dL   Creatinine, Ser 1.07 0.61 - 1.24 mg/dL   Calcium 8.9 8.9 - 10.3 mg/dL   Total Protein 7.3 6.5 - 8.1 g/dL   Albumin 3.9 3.5 - 5.0 g/dL   AST 28 15 - 41 U/L   ALT 27 17 - 63 U/L   Alkaline Phosphatase 86 38 - 126 U/L   Total Bilirubin 1.1 0.3 - 1.2 mg/dL   GFR calc non Af Amer >60 >60 mL/min   GFR calc Af Amer >60 >60 mL/min   Anion gap 9 5 - 15  CBC  Result Value Ref Range   WBC 5.5 4.0 - 10.5 K/uL   RBC 4.53 4.22 - 5.81 MIL/uL   Hemoglobin 14.3 13.0 - 17.0 g/dL   HCT 43.2 39.0 - 52.0 %   MCV 95.4 78.0 - 100.0 fL   MCH 31.6 26.0 - 34.0 pg   MCHC 33.1 30.0 - 36.0 g/dL   RDW 14.0 11.5 - 15.5 %   Platelets 137 (L) 150 - 400 K/uL  Urinalysis, Routine w reflex microscopic (not at [facility])  Result Value Ref Range   Color, Urine AMBER (A) YELLOW   APPearance CLEAR CLEAR   Specific Gravity, Urine 1.031 (H) 1.005 - 1.030   pH 5.5 5.0 - 8.0   Glucose, UA NEGATIVE NEGATIVE mg/dL   Hgb urine dipstick NEGATIVE NEGATIVE   Bilirubin Urine SMALL (A) NEGATIVE   Ketones, ur NEGATIVE NEGATIVE mg/dL   Protein, ur 30 (A) NEGATIVE mg/dL   Nitrite NEGATIVE NEGATIVE   Leukocytes, UA NEGATIVE NEGATIVE  Urine microscopic-add on  Result Value Ref Range   Squamous Epithelial / LPF NONE SEEN NONE SEEN   WBC, UA 0-5 0 - 5 WBC/hpf   RBC / HPF 0-5 0 - 5 RBC/hpf   Bacteria, UA RARE (A) NONE SEEN   Casts  HYALINE CASTS (A) NEGATIVE   Urine-Other MUCOUS PRESENT    Ct Abdomen Pelvis Wo Contrast  05/15/2015  CLINICAL DATA:  Upper abdominal pain for 3 days. EXAM: CT ABDOMEN AND PELVIS WITHOUT CONTRAST TECHNIQUE: Multidetector CT imaging of the abdomen and pelvis was performed following the standard protocol without IV contrast. COMPARISON:  None. FINDINGS: Mild multilevel degenerative disc disease is noted. Visualized lung bases appear intact. No gallstones are noted. No focal abnormality is noted in the liver, spleen or pancreas. Adrenal glands appear normal. 2 cm right renal cyst is noted. No hydronephrosis or renal obstruction is noted. No renal or ureteral calculi are noted. The appendix appears normal. There is no evidence of bowel obstruction. Atherosclerosis of abdominal aorta is noted. 3.2 cm infrarenal abdominal aortic aneurysm is noted. No abnormal fluid collection is noted. Sigmoid diverticulosis is noted without inflammation. Urinary bladder appears normal. No significant adenopathy is noted. IMPRESSION: No hydronephrosis or renal obstruction is noted. No renal or ureteral calculi are noted. Sigmoid diverticulosis is noted without inflammation. Atherosclerosis of abdominal aorta is noted. 3.2 cm infrarenal abdominal aortic aneurysm is noted. Recommend followup by ultrasound in 3 years. This recommendation follows ACR consensus guidelines: White Paper of the ACR Incidental Findings Committee II on Vascular Findings. J Am Coll Radiol 2013; 10:789-794. Electronically Signed   By: Marijo Conception, M.D.   On: 05/15/2015 14:21   Dg Chest 2 View  05/15/2015  CLINICAL DATA:  Cough and chest congestion. EXAM: CHEST  2 VIEW COMPARISON:  09/10/2014 FINDINGS: There is slight peribronchial thickening. The lungs are otherwise clear. Heart size and vascularity are normal. No  effusions. No osseous abnormality. IMPRESSION: Slight bronchitic changes. Electronically Signed   By: Lorriane Shire M.D.   On: 05/15/2015 14:14     MDM  I counseled patient for 5 minutes on smoking cessation. No evidence of hematuria or urinary infection. No etiology for hematuria seen on CT scan. Final diagnoses:  None   plan albuterol HFA with spacer to go to use 2 puffs every 4 hours when necessary cough or shortness of breath. Robitussin for cough Diagnoses #1 acute bronchitis #2 hyperglycemia #3 tobacco abuse #4 right flank pain      Orlie Dakin, MD 05/15/15 1886  Orlie Dakin, MD 05/15/15 7737

## 2015-05-15 NOTE — Discharge Instructions (Signed)
Acute Bronchitis Take Tylenol as directed for pain. Use Robitussin as needed for cough. Usually your inhaler 2 puffs every 4 hours as need for cough or shortness of breath. [facility] to help you to stop smoking. Bronchitis is when the airways that extend from the windpipe into the lungs get red, puffy, and painful (inflamed). Bronchitis often causes thick spit (mucus) to develop. This leads to a cough. A cough is the most common symptom of bronchitis. In acute bronchitis, the condition usually begins suddenly and goes away over time (usually in 2 weeks). Smoking, allergies, and asthma can make bronchitis worse. Repeated episodes of bronchitis may cause more lung problems. HOME CARE  Rest.  Drink enough fluids to keep your pee (urine) clear or pale yellow (unless you need to limit fluids as told by your doctor).  Only take over-the-counter or prescription medicines as told by your doctor.  Avoid smoking and secondhand smoke. These can make bronchitis worse. If you are a smoker, think about using nicotine gum or skin patches. Quitting smoking will help your lungs heal faster.  Reduce the chance of getting bronchitis again by:  Washing your hands often.  Avoiding people with cold symptoms.  Trying not to touch your hands to your mouth, nose, or eyes.  Follow up with your doctor as told. GET HELP IF: Your symptoms do not improve after 1 week of treatment. Symptoms include:  Cough.  Fever.  Coughing up thick spit.  Body aches.  Chest congestion.  Chills.  Shortness of breath.  Sore throat. GET HELP RIGHT AWAY IF:   You have an increased fever.  You have chills.  You have severe shortness of breath.  You have bloody thick spit (sputum).  You throw up (vomit) often.  You lose too much body fluid (dehydration).  You have a severe headache.  You faint. MAKE SURE YOU:   Understand these instructions.  Will  watch your condition.  Will get help right away if you are not doing well or get worse.   This information is not intended to replace advice given to you by your health care provider. Make sure you discuss any questions you have with your health care provider.   Document Released: 10/14/2007 Document Revised: 12/28/2012 Document Reviewed: 10/18/2012 Elsevier Interactive Patient Education [employer]2016 Elsevier Inc.

## 2015-05-15 NOTE — ED Notes (Addendum)
Verbalized understanding discharge instructions. In no acute distress.  Pt educated how to use inhaler and provided a work note.

## 2015-05-15 NOTE — ED Notes (Signed)
Pt co dry cough since Monday.  Also has been having rt flank pain x 1 month and yesterday began having hematuria.

## 2015-05-22 ENCOUNTER — Telehealth: Payer: Self-pay | Admitting: Internal Medicine

## 2015-05-22 NOTE — Telephone Encounter (Signed)
Pt calling in to the office to request for Dr Tenny Crawoss to write him a clearance letter to be cleared from doing "community service work," for he states that he was ordered to do this work, and now requires an Dance movement psychotherapistofficial letter from his cardiologist stating that he is unable to do this work, for he has a cardiac condition.  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  Pt states he cannot perform these duties in a outdoor setting, but will need a clearance from a MD, before they will remove him from this. Informed the pt that both Dr Tenny Crawoss and her nurse are out of the office today, but I will route this message to the both of them for further review and recommendation, and follow-up with the pt thereafter.  Pt verbalized understanding and agrees with this plan.

## 2015-05-22 NOTE — Telephone Encounter (Signed)
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

## 2015-05-22 NOTE — Telephone Encounter (Signed)
PT CALLED BACK-CAN BE REACHED ON CELL [phone]

## 2015-05-22 NOTE — Telephone Encounter (Signed)
Left pt a message to call back. 

## 2015-05-23 NOTE — Telephone Encounter (Signed)
Follow up     Patient calling checking on the status of letter from MD on reduce activity.

## 2015-05-24 NOTE — Telephone Encounter (Signed)
Message sent to Dr. Tenny Crawoss for recommendation.

## 2015-05-24 NOTE — Telephone Encounter (Signed)
Follow up  ° ° °Patient calling back to speak with nurse  °

## 2015-05-24 NOTE — Telephone Encounter (Signed)
Follow up  1/13@ 3:50pm  pt needs an note and he states he will come on monday

## 2015-05-25 ENCOUNTER — Encounter: Payer: Self-pay | Admitting: Internal Medicine

## 2015-05-27 ENCOUNTER — Encounter: Payer: Self-pay | Admitting: *Deleted

## 2015-05-27 NOTE — Telephone Encounter (Signed)
Letter printed and placed at front desk.  Pt has called this morning in regards to this. His fiance is going to pick letter up today.

## 2015-06-03 MED FILL — ATORVASTATIN 80 MG TABLET: 80 | 30 days supply | Qty: 30 | Fill #6

## 2015-06-05 MED FILL — LISINOPRIL 5 MG TABLET: 5 | 30 days supply | Qty: 30 | Fill #2

## 2015-06-09 ENCOUNTER — Ambulatory Visit (HOSPITAL_BASED_OUTPATIENT_CLINIC_OR_DEPARTMENT_OTHER): Payer: Medicaid Other | Attending: Cardiology

## 2015-06-09 VITALS — Ht 76.0 in | Wt 296.0 lb

## 2015-06-09 DIAGNOSIS — R0683 Snoring: Secondary | ICD-10-CM | POA: Insufficient documentation

## 2015-06-09 DIAGNOSIS — G4730 Sleep apnea, unspecified: Secondary | ICD-10-CM

## 2015-06-09 DIAGNOSIS — Z79899 Other long term (current) drug therapy: Secondary | ICD-10-CM | POA: Insufficient documentation

## 2015-06-09 DIAGNOSIS — G4733 Obstructive sleep apnea (adult) (pediatric): Secondary | ICD-10-CM | POA: Insufficient documentation

## 2015-06-12 ENCOUNTER — Telehealth: Payer: Self-pay | Admitting: Cardiology

## 2015-06-12 NOTE — Telephone Encounter (Signed)
Pt had successful PAP titration. Please setup appointment in 10 weeks. Please let [facility] know that order for PAP is in EPIC.   

## 2015-06-12 NOTE — Sleep Study (Signed)
   Patient Name: William, Young MRN: [id number] Study Date: 06/09/2015 Gender: Male D.O.B: [birth year] Age (years): 23 Referring Provider: Armanda Magic MD, ABSM Interpreting Physician: Armanda Magic MD, ABSM RPSGT: Cherylann Parr  Weight (lbs): 296 Height (inches): 75 BMI: 37 Neck Size: 18.50  CLINICAL INFORMATION The patient is referred for a CPAP titration to treat sleep apnea. Date of NPSG, Split Night or HST:01/29/2015  SLEEP STUDY TECHNIQUE As per the AASM Manual for the Scoring of Sleep and Associated Events v2.3 (April 2016) with a hypopnea requiring 4% desaturations. The channels recorded and monitored were frontal, central and occipital EEG, electrooculogram (EOG), submentalis EMG (chin), nasal and oral airflow, thoracic and abdominal wall motion, anterior tibialis EMG, snore microphone, electrocardiogram, and pulse oximetry. Continuous positive airway pressure (CPAP) was initiated at the beginning of the study and titrated to treat sleep-disordered breathing.  MEDICATIONS Medications taken by the patient : Atorvastatin, Coreg, Glucotrol, Robitussin, Lisinopril, Zantac, Brilinta Medications administered by patient during sleep study : No sleep medicine administered.  TECHNICIAN COMMENTS Comments added by technician: Patient tolerated the cpap mask.  Comments added by scorer: N/A  RESPIRATORY PARAMETERS Optimal PAP Pressure (cm): 14  AHI at Optimal Pressure (/hr):0.0 Overall Minimal O2 (%):92.00   Supine % at Optimal Pressure (%):0 Minimal O2 at Optimal Pressure (%):96.0    SLEEP ARCHITECTURE The study was initiated at 11:01:35 PM and ended at 5:04:43 AM. Sleep onset time was 18.3 minutes and the sleep efficiency was 88.7%. The total sleep time was 322.0 minutes. The patient spent 3.88% of the night in stage N1 sleep, 70.81% in stage N2 sleep, 0.00% in stage N3 and 25.31% in REM.Stage REM latency was 51.0 minutes Wake after sleep onset was 22.8. Alpha intrusion was  absent. Supine sleep was 1.53%.  CARDIAC DATA The 2 lead EKG demonstrated sinus rhythm. The mean heart rate was 67.80 beats per minute. Other EKG findings include: None.  LEG MOVEMENT DATA The total Periodic Limb Movements of Sleep (PLMS) were 110. The PLMS index was 20.50. A PLMS index of <15 is considered normal in adults.  IMPRESSIONS - The optimal PAP pressure was 14 cm of water. - Central sleep apnea was not noted during this titration (CAI = 0.0/h). - Significant oxygen desaturations were not observed during this titration (min O2 = 92.00%). - The patient snored with Soft snoring volume during this titration study. - No cardiac abnormalities were observed during this study. - Mild periodic limb movements were observed during this study. Arousals associated with PLMs were rare.  DIAGNOSIS - Obstructive Sleep Apnea (327.23 [G47.33 ICD-10])  RECOMMENDATIONS - Trial of CPAP therapy on 14 cm H2O with a Large size Fisher&Paykel Full Face Mask Simplus mask and heated humidification. - Avoid alcohol, sedatives and other CNS depressants that may worsen sleep apnea and disrupt normal sleep architecture. - Sleep hygiene should be reviewed to assess factors that may improve sleep quality. - Weight management and regular exercise should be initiated or continued. - Return to Sleep Center for re-evaluation after 10 weeks of therapy   Quintella Reichert Diplomate, American Board of Sleep Medicine  ELECTRONICALLY SIGNED ON:  06/12/2015, 12:46 PM [facility] PH: [phone]   FX: [phone] ACCREDITED BY THE AMERICAN ACADEMY OF SLEEP MEDICINE

## 2015-06-12 NOTE — Addendum Note (Signed)
Addended by: Armanda Magic R on: 06/12/2015 12:52 PM   Modules accepted: Orders

## 2015-06-13 MED FILL — CARVEDILOL 12.5 MG TABLET: 12.5 | 30 days supply | Qty: 60 | Fill #6

## 2015-06-14 NOTE — Telephone Encounter (Signed)
Patient informed of results. Stated verbal understanding. [facility] notified of orders.  Once patient begins therapy, I will schedule 10 week follow-up 

## 2015-07-08 MED FILL — ATORVASTATIN 80 MG TABLET: 80 | 30 days supply | Qty: 30 | Fill #7

## 2015-07-12 MED FILL — LISINOPRIL 5 MG TABLET: 5 | 30 days supply | Qty: 30 | Fill #3

## 2015-11-01 ENCOUNTER — Ambulatory Visit (INDEPENDENT_AMBULATORY_CARE_PROVIDER_SITE_OTHER): Payer: Self-pay | Admitting: Internal Medicine

## 2015-11-01 ENCOUNTER — Encounter: Payer: Self-pay | Admitting: Internal Medicine

## 2015-11-01 VITALS — BP 150/86 | HR 73 | Ht 76.0 in | Wt 304.8 lb

## 2015-11-01 DIAGNOSIS — I10 Essential (primary) hypertension: Secondary | ICD-10-CM

## 2015-11-01 DIAGNOSIS — E119 Type 2 diabetes mellitus without complications: Secondary | ICD-10-CM

## 2015-11-01 LAB — BASIC METABOLIC PANEL
BUN: 9 mg/dL (ref 7–25)
CALCIUM: 9.1 mg/dL (ref 8.6–10.3)
CO2: 24 mmol/L (ref 20–31)
CREATININE: 1.02 mg/dL (ref 0.70–1.33)
Chloride: 101 mmol/L (ref 98–110)
Glucose, Bld: 183 mg/dL — ABNORMAL HIGH (ref 65–99)
POTASSIUM: 4.4 mmol/L (ref 3.5–5.3)
Sodium: 134 mmol/L — ABNORMAL LOW (ref 135–146)

## 2015-11-01 LAB — TSH: TSH: 1.69 mIU/L (ref 0.40–4.50)

## 2015-11-01 LAB — CBC WITH DIFFERENTIAL/PLATELET
Basophils Absolute: 78 {cells}/uL (ref 0–200)
Basophils Relative: 1 %
Eosinophils Absolute: 234 {cells}/uL (ref 15–500)
Eosinophils Relative: 3 %
HCT: 44.4 % (ref 38.5–50.0)
Hemoglobin: 15.5 g/dL (ref 13.2–17.1)
Lymphocytes Relative: 32 %
Lymphs Abs: 2496 {cells}/uL (ref 850–3900)
MCH: 32.4 pg (ref 27.0–33.0)
MCHC: 34.9 g/dL (ref 32.0–36.0)
MCV: 92.7 fL (ref 80.0–100.0)
MPV: 11 fL (ref 7.5–12.5)
Monocytes Absolute: 624 {cells}/uL (ref 200–950)
Monocytes Relative: 8 %
Neutro Abs: 4368 {cells}/uL (ref 1500–7800)
Neutrophils Relative %: 56 %
Platelets: 178 K/uL (ref 140–400)
RBC: 4.79 MIL/uL (ref 4.20–5.80)
RDW: 13.6 % (ref 11.0–15.0)
WBC: 7.8 K/uL (ref 3.8–10.8)

## 2015-11-01 LAB — HEMOGLOBIN A1C
Hgb A1c MFr Bld: 7.5 % — ABNORMAL HIGH (ref <5.7)
Mean Plasma Glucose: 169 mg/dL

## 2015-11-01 NOTE — Progress Notes (Signed)
Cardiology Office Note   Date:  11/01/2015   ID:  William Young, DOB Mar 10, 1961, MRN [id number]  PCP:  Lance Bosch, NP  Cardiologist:   Dorris Carnes, MD   F/U of CAD     History of Present Illness: William Young is a 55 y.o. male with a history of CAD, x/p NSTEMI  May 2016  Cath showed 90% prox RCAand 80% R PLSA.  Underent PCI/BMS to RCA and PCI/DES to PLSA  Hx ASA allergy  Rx Brilinta for 1 yr then Plavix to be considered.  LVEF normal    Pt was in clinic in November  Since seen he says he ran out of meds a few months ago  Says that he is not approved for them through medicaid Working a job where he sits a lot  [employer] up Denies CP   Back to smoking Less than a pack per day        Outpatient Prescriptions Prior to Visit  Medication Sig Dispense Refill  . acetaminophen (TYLENOL) 500 MG tablet Take 1,000 mg by mouth every 6 (six) hours as needed for mild pain.    Marland Kitchen atorvastatin (LIPITOR) 80 MG tablet Take 1 tablet (80 mg total) by mouth daily at 6 PM. 30 tablet 11  . Blood Glucose Monitoring Suppl (TRUERESULT BLOOD GLUCOSE) W/DEVICE KIT 1 each by Does not apply route 2 (two) times daily. 1 each 0  . carvedilol (COREG) 12.5 MG tablet Take 1 tablet (12.5 mg total) by mouth 2 (two) times daily with a meal. 60 tablet 11  . glipiZIDE (GLUCOTROL) 5 MG tablet Take 1 tablet (5 mg total) by mouth 2 (two) times daily before a meal. 60 tablet 3  . glucose blood (TRUETEST TEST) test strip Use as instructed 100 each 12  . guaifenesin (ROBITUSSIN CHEST CONGESTION) 100 MG/5ML syrup Take 200 mg by mouth 3 (three) times daily as needed for cough.    . Lancets 30G MISC 1 each by Does not apply route 2 (two) times daily. 100 each 6  . lisinopril (PRINIVIL,ZESTRIL) 5 MG tablet Take 1 tablet (5 mg total) by mouth daily. 30 tablet 11  . nitroGLYCERIN (NITROSTAT) 0.4 MG SL tablet Place 1 tablet (0.4 mg total) under the tongue every 5 (five) minutes x 3 doses as needed for chest pain. 25  tablet 12  . ranitidine (ZANTAC) 150 MG tablet Take 1 tablet (150 mg total) by mouth 2 (two) times daily. (Patient taking differently: Take 150 mg by mouth 2 (two) times daily as needed for heartburn. ) 60 tablet 2  . ticagrelor (BRILINTA) 90 MG TABS tablet Take 1 tablet (90 mg total) by mouth 2 (two) times daily. 180 tablet 3   No facility-administered medications prior to visit.     Allergies:   Aspirin and Ibuprofen   Past Medical History  Diagnosis Date  . GERD (gastroesophageal reflux disease)   . Tobacco use   . Hyperglycemia   . CAD (coronary artery disease) 09/2014    a. NSTEMI >> LHC 5/16:  mLAD 15, pRCA 90, mRCA 40, RPLB3 80, EF normal with inf HK >> PCI:  BMS to pRCA and DES to RPLB3   . Diabetes mellitus without complication ([place])   . HTN (hypertension)   . OSA (obstructive sleep apnea)   . History of echocardiogram     Echo 6/16:  EF 50-55%, no RWMA, Gr 1 DD    Past Surgical History  Procedure Laterality Date  .  None    . Cardiac catheterization N/A 09/11/2014    Procedure: Left Heart Cath and Coronary Angiography;  Surgeon: Leonie Man, MD;  Location: [facility] INVASIVE CV LAB CUPID;  Service: Cardiovascular;  Laterality: N/A;  . Cardiac catheterization  09/11/2014    Procedure: Coronary Stent Intervention;  Surgeon: Leonie Man, MD;  Location: [facility] INVASIVE CV LAB CUPID;  Service: Cardiovascular;;     Social History:  The patient  reports that he has been smoking Cigarettes.  He has a 35 pack-year smoking history. He has never used smokeless tobacco. He reports that he drinks alcohol. He reports that he does not use illicit drugs.   Family History:  The patient's family history includes Cancer in his maternal grandfather; Heart attack in his maternal grandmother, maternal uncle, and paternal grandmother. There is no history of Stroke or Hypertension.    ROS:  Please see the history of present illness. All other systems are reviewed and  Negative to the above problem  except as noted.    PHYSICAL EXAM: VS:  BP 150/86 mmHg  Pulse 73  Ht _0  (1.93 m)  Wt 304 lb 12.8 oz (138.256 kg)  BMI 37.12 kg/m2  GEN: Well nourished, well developed, in no acute distress HEENT: normal Neck: no JVD, carotid bruits, or masses Cardiac: RRR; no murmurs, rubs, or gallops, 1+  edema  Respiratory:  clear to auscultation bilaterally, normal work of breathing GI: soft, nontender, nondistended, + BS  No hepatomegaly  MS: no deformity Moving all extremities   Skin: warm and dry, no rash Neuro:  Strength and sensation are intact Psych: euthymic mood, full affect   EKG:  EKG is ordered today.  SR 73 bpm  LAFB     Lipid Panel    Component Value Date/Time   CHOL 107 11/26/2014 0759   TRIG 102.0 11/26/2014 0759   HDL 27.50* 11/26/2014 0759   CHOLHDL 4 11/26/2014 0759   VLDL 20.4 11/26/2014 0759   LDLCALC 59 11/26/2014 0759      Wt Readings from Last 3 Encounters:  11/01/15 304 lb 12.8 oz (138.256 kg)  06/09/15 296 lb (134.265 kg)  03/28/15 296 lb (134.265 kg)      ASSESSMENT AND PLAN:  1  CAD  I am not convinced on active ischemia  He needs to get back on meds  Not clear how he was turned down for medicaid  Look for samples of Brilinta  Contact office  Check CBC and TSh    2.  HTN  BP is up  Offo of meds   3.  DM  Needs meds  Check A1C  4.  HL  Hold on checking since ont on messs  5.  Sleep apnea  Needs CPAP  6  Tob  Counselled on cessation.     Wiill try to reestablish him in [place], Dorris Carnes, MD  11/01/2015 8:54 AM    [address], [place] [address], [place]  [address] Phone: [phone]; Fax: [phone]

## 2015-11-01 NOTE — Patient Instructions (Addendum)
Medication Instructions:  SAMPLES OF BRILINTA 90 MG TWICE A DAY HAVE BEEN PROVIDED SO YOU CAN RESUME   Labwork: CBC/BMET/A1C/TSH TODAY  Testing/Procedures: NONE  Follow-Up: Your physician recommends that you schedule a follow-up appointment in: OCTOBER  Any Other Special Instructions Will Be Listed Below (If Applicable). HAVE SCHEDULED YOU AN APPOINTMENT ON 11/18/15 AT 9:00 AM AT THE  [facility]   If you need a refill on your cardiac medications before your next appointment, please call your pharmacy.

## 2015-11-07 ENCOUNTER — Telehealth: Payer: Self-pay | Admitting: Internal Medicine

## 2015-11-07 DIAGNOSIS — M7989 Other specified soft tissue disorders: Secondary | ICD-10-CM

## 2015-11-07 MED ORDER — FUROSEMIDE 40 MG PO TABS: 40.0000 mg | ORAL_TABLET | Freq: Every day | ORAL | Status: DC | PRN

## 2015-11-07 NOTE — Telephone Encounter (Signed)
-----   Message from Pricilla RifflePaula V Ross, MD sent at 11/03/2015  9:11 AM EDT ----- Hgb A1C is high  Needs tighter control of sugars CBC is OK  Thyroid funcion appears OK Electrolytes and kidney function are OK  He needs to get meds, especially brilinta (or he can switch to Plavix 75 mg daily instead). For swelling in ankels / shins can try 40 mg lasix  Take a few times per week  F/U BMET and BNP in 3 wks   Needs to get intor IM clinic

## 2015-11-07 NOTE — Telephone Encounter (Signed)
Informed patient. Reviewed lab results. He verbalizes understanding and agreement. He has an appointment on November 18, 2015. He has enough brilinta for a couple months (samples). Advised to call back when starts to get low, if needed we will change to a different medication.

## 2015-11-07 NOTE — Telephone Encounter (Signed)
Follow-up     Pt returning the nurses call concerning lab results no other information provided

## 2015-11-18 ENCOUNTER — Encounter: Payer: Self-pay | Admitting: Internal Medicine

## 2015-11-18 ENCOUNTER — Ambulatory Visit: Payer: Self-pay | Attending: Internal Medicine | Admitting: Internal Medicine

## 2015-11-18 VITALS — BP 134/84 | HR 91 | Temp 97.9°F | Resp 16 | Wt 302.0 lb

## 2015-11-18 DIAGNOSIS — H6123 Impacted cerumen, bilateral: Secondary | ICD-10-CM | POA: Insufficient documentation

## 2015-11-18 DIAGNOSIS — I2510 Atherosclerotic heart disease of native coronary artery without angina pectoris: Secondary | ICD-10-CM | POA: Insufficient documentation

## 2015-11-18 DIAGNOSIS — Z720 Tobacco use: Secondary | ICD-10-CM

## 2015-11-18 DIAGNOSIS — I214 Non-ST elevation (NSTEMI) myocardial infarction: Secondary | ICD-10-CM | POA: Insufficient documentation

## 2015-11-18 DIAGNOSIS — I10 Essential (primary) hypertension: Secondary | ICD-10-CM | POA: Insufficient documentation

## 2015-11-18 DIAGNOSIS — K219 Gastro-esophageal reflux disease without esophagitis: Secondary | ICD-10-CM | POA: Insufficient documentation

## 2015-11-18 DIAGNOSIS — Z79899 Other long term (current) drug therapy: Secondary | ICD-10-CM | POA: Insufficient documentation

## 2015-11-18 DIAGNOSIS — F17210 Nicotine dependence, cigarettes, uncomplicated: Secondary | ICD-10-CM | POA: Insufficient documentation

## 2015-11-18 DIAGNOSIS — Z1211 Encounter for screening for malignant neoplasm of colon: Secondary | ICD-10-CM

## 2015-11-18 DIAGNOSIS — E119 Type 2 diabetes mellitus without complications: Secondary | ICD-10-CM | POA: Insufficient documentation

## 2015-11-18 DIAGNOSIS — G4733 Obstructive sleep apnea (adult) (pediatric): Secondary | ICD-10-CM | POA: Insufficient documentation

## 2015-11-18 LAB — LIPID PANEL
CHOL/HDL RATIO: 6.1 ratio — AB (ref <=5.0)
CHOLESTEROL: 200 mg/dL (ref 125–200)
HDL: 33 mg/dL — ABNORMAL LOW (ref >=40)
LDL CALC: 134 mg/dL — AB (ref <130)
Triglycerides: 165 mg/dL — ABNORMAL HIGH (ref <150)
VLDL: 33 mg/dL — AB (ref <30)

## 2015-11-18 LAB — POCT GLYCOSYLATED HEMOGLOBIN (HGB A1C): Hemoglobin A1C: 7.5

## 2015-11-18 LAB — GLUCOSE, POCT (MANUAL RESULT ENTRY): POC GLUCOSE: 175 mg/dL — AB (ref 70–99)

## 2015-11-18 MED ORDER — METFORMIN HCL ER 500 MG PO TB24: 500.0000 mg | ORAL_TABLET | Freq: Every day | ORAL | Status: DC

## 2015-11-18 MED ORDER — CARBAMIDE PEROXIDE 6.5 % OT SOLN: 5.0000 [drp] | Freq: Two times a day (BID) | OTIC | Status: DC

## 2015-11-18 MED ORDER — GLUCOSE BLOOD VI STRP: ORAL_STRIP | Status: DC

## 2015-11-18 MED FILL — FUROSEMIDE 40 MG TABLET: 40 | 30 days supply | Qty: 30 | Fill #0

## 2015-11-18 NOTE — Addendum Note (Signed)
Addended by: Carolynne EdouardPOLLOCK, JAY'A R on: 11/18/2015 11:10 AM   Modules accepted: Orders

## 2015-11-18 NOTE — Patient Instructions (Addendum)
* please add to podiatry clinic for eval. /dm foot * financial assistance - does he have medicaid?  Diabetes and Foot Care Diabetes may cause you to have problems because of poor blood supply (circulation) to your feet and legs. This may cause the skin on your feet to become thinner, break easier, and heal more slowly. Your skin may become dry, and the skin may peel and crack. You may also have nerve damage in your legs and feet causing decreased feeling in them. You may not notice minor injuries to your feet that could lead to infections or more serious problems. Taking care of your feet is one of the most important things you can do for yourself.  HOME CARE INSTRUCTIONS  Wear shoes at all times, even in the house. Do not go barefoot. Bare feet are easily injured.  Check your feet daily for blisters, cuts, and redness. If you cannot see the bottom of your feet, use a mirror or ask someone for help.  Wash your feet with warm water (do not use hot water) and mild soap. Then pat your feet and the areas between your toes until they are completely dry. Do not soak your feet as this can dry your skin.  Apply a moisturizing lotion or petroleum jelly (that does not contain alcohol and is unscented) to the skin on your feet and to dry, brittle toenails. Do not apply lotion between your toes.  Trim your toenails straight across. Do not dig under them or around the cuticle. File the edges of your nails with an emery board or nail file.  Do not cut corns or calluses or try to remove them with medicine.  Wear clean socks or stockings every day. Make sure they are not too tight. Do not wear knee-high stockings since they may decrease blood flow to your legs.  Wear shoes that fit properly and have enough cushioning. To break in new shoes, wear them for just a few hours a day. This prevents you from injuring your feet. Always look in your shoes before you put them on to be sure there are no objects  inside.  Do not cross your legs. This may decrease the blood flow to your feet.  If you find a minor scrape, cut, or break in the skin on your feet, keep it and the skin around it clean and dry. These areas may be cleansed with mild soap and water. Do not cleanse the area with peroxide, alcohol, or iodine.  When you remove an adhesive bandage, be sure not to damage the skin around it.  If you have a wound, look at it several times a day to make sure it is healing.  Do not use heating pads or hot water bottles. They may burn your skin. If you have lost feeling in your feet or legs, you may not know it is happening until it is too late.  Make sure your health care provider performs a complete foot exam at least annually or more often if you have foot problems. Report any cuts, sores, or bruises to your health care provider immediately. SEEK MEDICAL CARE IF:   You have an injury that is not healing.  You have cuts or breaks in the skin.  You have an ingrown nail.  You notice redness on your legs or feet.  You feel burning or tingling in your legs or feet.  You have pain or cramps in your legs and feet.  Your legs or feet  are numb.  Your feet always feel cold. SEEK IMMEDIATE MEDICAL CARE IF:   There is increasing redness, swelling, or pain in or around a wound.  There is a red line that goes up your leg.  Pus is coming from a wound.  You develop a fever or as directed by your health care provider.  You notice a bad smell coming from an ulcer or wound.   This information is not intended to replace advice given to you by your health care provider. Make sure you discuss any questions you have with your health care provider.   Document Released: 04/24/2000 Document Revised: 12/28/2012 Document Reviewed: 10/04/2012 Elsevier Interactive Patient Education 2016 Elsevier Inc[employer]  - Diabetes Mellitus and Food It is important for you to manage your blood sugar (glucose) level. Your  blood glucose level can be greatly affected by what you eat. Eating healthier foods in the appropriate amounts throughout the day at about the same time each day will help you control your blood glucose level. It can also help slow or prevent worsening of your diabetes mellitus. Healthy eating may even help you improve the level of your blood pressure and reach or maintain a healthy weight.  General recommendations for healthful eating and cooking habits include:  Eating meals and snacks regularly. Avoid going long periods of time without eating to lose weight.  Eating a diet that consists mainly of plant-based foods, such as fruits, vegetables, nuts, legumes, and whole grains.  Using low-heat cooking methods, such as baking, instead of high-heat cooking methods, such as deep frying. Work with your dietitian to make sure you understand how to use the Nutrition Facts information on food labels. HOW CAN FOOD AFFECT ME? Carbohydrates Carbohydrates affect your blood glucose level more than any other type of food. Your dietitian will help you determine how many carbohydrates to eat at each meal and teach you how to count carbohydrates. Counting carbohydrates is important to keep your blood glucose at a healthy level, especially if you are using insulin or taking certain medicines for diabetes mellitus. Alcohol Alcohol can cause sudden decreases in blood glucose (hypoglycemia), especially if you use insulin or take certain medicines for diabetes mellitus. Hypoglycemia can be a life-threatening condition. Symptoms of hypoglycemia (sleepiness, dizziness, and disorientation) are similar to symptoms of having too much alcohol.  If your health care provider has given you approval to drink alcohol, do so in moderation and use the following guidelines:  Women should not have more than one drink per day, and men should not have more than two drinks per day. One drink is equal to:  12 oz of beer.  5 oz of  wine.  1 oz of hard liquor.  Do not drink on an empty stomach.  Keep yourself hydrated. Have water, diet soda, or unsweetened iced tea.  Regular soda, juice, and other mixers might contain a lot of carbohydrates and should be counted. WHAT FOODS ARE NOT RECOMMENDED? As you make food choices, it is important to remember that all foods are not the same. Some foods have fewer nutrients per serving than other foods, even though they might have the same number of calories or carbohydrates. It is difficult to get your body what it needs when you eat foods with fewer nutrients. Examples of foods that you should avoid that are high in calories and carbohydrates but low in nutrients include:  Trans fats (most processed foods list trans fats on the Nutrition Facts label).  Regular soda.  Juice.  Candy.  Sweets, such as cake, pie, doughnuts, and cookies.  Fried foods. WHAT FOODS CAN I EAT? Eat nutrient-rich foods, which will nourish your body and keep you healthy. The food you should eat also will depend on several factors, including:  The calories you need.  The medicines you take.  Your weight.  Your blood glucose level.  Your blood pressure level.  Your cholesterol level. You should eat a variety of foods, including:  Protein.  Lean cuts of meat.  Proteins low in saturated fats, such as fish, egg whites, and beans. Avoid processed meats.  Fruits and vegetables.  Fruits and vegetables that may help control blood glucose levels, such as apples, mangoes, and yams.  Dairy products.  Choose fat-free or low-fat dairy products, such as milk, yogurt, and cheese.  Grains, bread, pasta, and rice.  Choose whole grain products, such as multigrain bread, whole oats, and brown rice. These foods may help control blood pressure.  Fats.  Foods containing healthful fats, such as nuts, avocado, olive oil, canola oil, and fish. DOES EVERYONE WITH DIABETES MELLITUS HAVE THE SAME MEAL  PLAN? Because every person with diabetes mellitus is different, there is not one meal plan that works for everyone. It is very important that you meet with a dietitian who will help you create a meal plan that is just right for you.   This information is not intended to replace advice given to you by your health care provider. Make sure you discuss any questions you have with your health care provider.   Document Released: 01/22/2005 Document Revised: 05/18/2014 Document Reviewed: 03/24/2013 Elsevier Interactive Patient Education 2016 [employer].  - Tips for Eating Away From Home If You Have Diabetes Controlling your level of blood glucose, also known as blood sugar, can be challenging. It can be even more difficult when you do not prepare your own meals. The following tips can help you manage your diabetes when you eat away from home. PLANNING AHEAD Plan ahead if you know you will be eating away from home:  Ask your health care provider how to time meals and medicine if you are taking insulin.  Make a list of restaurants near you that offer healthy choices. If they have a carry-out menu, take it home and plan what you will order ahead of time.  Look up the restaurant you want to eat at online. Many chain and fast-food restaurants list nutritional information online. Use this information to choose the healthiest options and to calculate how many carbohydrates will be in your meal.  Use a carbohydrate-counting book or mobile app to look up the carbohydrate content and serving size of the foods you want to eat.  Become familiar with serving sizes and learn to recognize how many servings are in a portion. This will allow you to estimate how many carbohydrates you can eat. FREE FOODS A "free food" is any food or drink that has less than 5 g of carbohydrates per serving. Free foods include:  Many vegetables.  Hard boiled eggs.  Nuts or seeds.  Olives.  Cheeses.  Meats. These types  of foods make good appetizer choices and are often available at salad bars. Lemon juice, vinegar, or a low-calorie salad dressing of fewer than 20 calories per serving can be used as a "free" salad dressing.  CHOICES TO REDUCE CARBOHYDRATES  Substitute nonfat sweetened yogurt with a sugar-free yogurt. Yogurt made from soy milk may also be used, but you will still want  a sugar-free or plain option to choose a lower carbohydrate amount.  Ask your server to take away the bread basket or chips from your table.  Order fresh fruit. A salad bar often offers fresh fruit choices. Avoid canned fruit because it is usually packed in sugar or syrup.  Order a salad, and eat it without dressing. Or, create a "free" salad dressing.  Ask for substitutions. For example, instead of [place] fries, request an order of a vegetable such as salad, green beans, or broccoli. OTHER TIPS   If you take insulin, take the insulin once your food arrives to your table. This will ensure your insulin and food are timed correctly.  Ask your server about the portion size before your order, and ask for a take-out box if the portion has more servings than you should have. When your food comes, leave the amount you should have on the plate, and put the rest in the take-out box.  Consider splitting an entree with someone and ordering a side salad.   This information is not intended to replace advice given to you by your health care provider. Make sure you discuss any questions you have with your health care provider.   Document Released: 04/27/2005 Document Revised: 01/16/2015 Document Reviewed: 07/25/2013 Elsevier Interactive Patient Education 2016 Elsevier Inc.  - DASH Eating Plan DASH stands for "Dietary Approaches to Stop Hypertension." The DASH eating plan is a healthy eating plan that has been shown to reduce high blood pressure (hypertension). Additional health benefits may include reducing the risk of type 2 diabetes  mellitus, heart disease, and stroke. The DASH eating plan may also help with weight loss. WHAT DO I NEED TO KNOW ABOUT THE DASH EATING PLAN? For the DASH eating plan, you will follow these general guidelines:  Choose foods with a percent daily value for sodium of less than 5% (as listed on the food label).  Use salt-free seasonings or herbs instead of table salt or sea salt.  Check with your health care provider or pharmacist before using salt substitutes.  Eat lower-sodium products, often labeled as "lower sodium" or "no salt added."  Eat fresh foods.  Eat more vegetables, fruits, and low-fat dairy products.  Choose whole grains. Look for the word "whole" as the first word in the ingredient list.  Choose fish and skinless chicken or [place] more often than red meat. Limit fish, poultry, and meat to 6 oz (170 g) each day.  Limit sweets, desserts, sugars, and sugary drinks.  Choose heart-healthy fats.  Limit cheese to 1 oz (28 g) per day.  Eat more home-cooked food and less restaurant, buffet, and fast food.  Limit fried foods.  Cook foods using methods other than frying.  Limit canned vegetables. If you do use them, rinse them well to decrease the sodium.  When eating at a restaurant, ask that your food be prepared with less salt, or no salt if possible. WHAT FOODS CAN I EAT? Seek help from a dietitian for individual calorie needs. Grains Whole grain or whole wheat bread. Brown rice. Whole grain or whole wheat pasta. Quinoa, bulgur, and whole grain cereals. Low-sodium cereals. Corn or whole wheat flour tortillas. Whole grain cornbread. Whole grain crackers. Low-sodium crackers. Vegetables Fresh or frozen vegetables (raw, steamed, roasted, or grilled). Low-sodium or reduced-sodium tomato and vegetable juices. Low-sodium or reduced-sodium tomato sauce and paste. Low-sodium or reduced-sodium canned vegetables.  Fruits All fresh, canned (in natural juice), or frozen fruits. Meat  and Other Protein Products  Ground beef (85% or leaner), grass-fed beef, or beef trimmed of fat. Skinless chicken or [place]. Ground chicken or [place]. Pork trimmed of fat. All fish and seafood. Eggs. Dried beans, peas, or lentils. Unsalted nuts and seeds. Unsalted canned beans. Dairy Low-fat dairy products, such as skim or 1% milk, 2% or reduced-fat cheeses, low-fat ricotta or cottage cheese, or plain low-fat yogurt. Low-sodium or reduced-sodium cheeses. Fats and Oils Tub margarines without trans fats. Light or reduced-fat mayonnaise and salad dressings (reduced sodium). Avocado. Safflower, olive, or canola oils. Natural peanut or almond butter. Other Unsalted popcorn and pretzels. The items listed above may not be a complete list of recommended foods or beverages. Contact your dietitian for more options. WHAT FOODS ARE NOT RECOMMENDED? Grains White bread. White pasta. White rice. Refined cornbread. Bagels and croissants. Crackers that contain trans fat. Vegetables Creamed or fried vegetables. Vegetables in a cheese sauce. Regular canned vegetables. Regular canned tomato sauce and paste. Regular tomato and vegetable juices. Fruits Dried fruits. Canned fruit in light or heavy syrup. Fruit juice. Meat and Other Protein Products Fatty cuts of meat. Ribs, chicken wings, bacon, sausage, bologna, salami, chitterlings, fatback, hot dogs, bratwurst, and packaged luncheon meats. Salted nuts and seeds. Canned beans with salt. Dairy Whole or 2% milk, cream, half-and-half, and cream cheese. Whole-fat or sweetened yogurt. Full-fat cheeses or blue cheese. Nondairy creamers and whipped toppings. Processed cheese, cheese spreads, or cheese curds. Condiments Onion and garlic salt, seasoned salt, table salt, and sea salt. Canned and packaged gravies. Worcestershire sauce. Tartar sauce. Barbecue sauce. Teriyaki sauce. Soy sauce, including reduced sodium. Steak sauce. Fish sauce. Oyster sauce. Cocktail sauce.  Horseradish. Ketchup and mustard. Meat flavorings and tenderizers. Bouillon cubes. Hot sauce. Tabasco sauce. Marinades. Taco seasonings. Relishes. Fats and Oils Butter, stick margarine, lard, shortening, ghee, and bacon fat. Coconut, palm kernel, or palm oils. Regular salad dressings. Other Pickles and olives. Salted popcorn and pretzels. The items listed above may not be a complete list of foods and beverages to avoid. Contact your dietitian for more information. WHERE CAN I FIND MORE INFORMATION? National Heart, Lung, and Blood Institute: [employer]   This information is not intended to replace advice given to you by your health care provider. Make sure you discuss any questions you have with your health care provider.   Document Released: 04/16/2011 Document Revised: 05/18/2014 Document Reviewed: 03/01/2013 Elsevier Interactive Patient Education [employer]

## 2015-11-18 NOTE — Progress Notes (Signed)
William Young, is a 55 y.o. male  NWG:[id number]  VHQ:[id number]  DOB - 06/12/1960  CC:  Chief Complaint  Patient presents with  . Diabetes       HPI: William Young is a 55 y.o. male here today to establish medical care, w/ hx of dm 2, nstemi, May 2016 Cath showed 90% prox RCAand 80% R PLSA. Underent PCI/BMS to RCA and PCI/DES to PLSA Hx ASA allergy Rx Brilinta, recently saw cards 11/01/15 after being off meds for few months due to Medicaid issues.  Currently diet controlled dm, eats lots of vegies/salads., avoids salts and fried foods.  +tob 1pack /several days.  Drinks perhaps 1 beer /wk.  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  Inactive lifestyle otherwise.  Patient has No headache, No chest pain, No abdominal pain - No Nausea, No new weakness tingling or numbness, No Cough - SOB.    Review of Systems: Per HPI, o/w all systems reviewed and negative.   Allergies  Allergen Reactions  . Aspirin Anaphylaxis, Swelling and Rash    Throat closes   . Ibuprofen Anaphylaxis, Swelling and Rash   Past Medical History  Diagnosis Date  . GERD (gastroesophageal reflux disease)   . Tobacco use   . Hyperglycemia   . CAD (coronary artery disease) 09/2014    a. NSTEMI >> LHC 5/16:  mLAD 15, pRCA 90, mRCA 40, RPLB3 80, EF normal with inf HK >> PCI:  BMS to pRCA and DES to RPLB3   . Diabetes mellitus without complication (HCC)   . HTN (hypertension)   . OSA (obstructive sleep apnea)   . History of echocardiogram     Echo 6/16:  EF 50-55%, no RWMA, Gr 1 DD   Current Outpatient Prescriptions on File Prior to Visit  Medication Sig Dispense Refill  . ticagrelor (BRILINTA) 90 MG TABS tablet Take by mouth 2 (two) times daily.    . furosemide (LASIX) 40 MG tablet Take 1 tablet (40 mg total) by mouth daily as needed (take 2-4 times per week as needed for lower extremity swelling). (Patient not taking: Reported on 11/18/2015) 90 tablet 1   No current facility-administered  medications on file prior to visit.   Family History  Problem Relation Age of Onset  . Heart attack Maternal Uncle   . Heart attack Paternal Grandmother   . Cancer Maternal Grandfather   . Heart attack Maternal Grandmother   . Stroke Neg Hx   . Hypertension Neg Hx    Social History   Social History  . Marital Status: Widowed    Spouse Name: N/A  . Number of Children: N/A  . Years of Education: N/A   Occupational History  . 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 Smoking status: Current Every Day Smoker -- 1.00 packs/day for 35 years    Types: Cigarettes    Last Attempt to Quit: 09/10/2014  . Smokeless tobacco: Never Used  . Alcohol Use: 0.0 oz/week    0 Standard drinks or equivalent per week     Comment: occassional  . Drug Use: No  . Sexual Activity: Yes   Other Topics Concern  . Not on file   Social History Narrative   Lives with fiance and 2 daughters. Neither his parents nor siblings have any cardiac issues that he knows of.    Objective:   Filed Vitals:   11/18/15 0916  BP: 134/84  Pulse: 91  Temp: 97.9 F (36.6 C)  Resp: 16    Filed Weights   11/18/15 0916  Weight: 302 lb (136.986 kg)    BP Readings from Last 3 Encounters:  11/18/15 134/84  11/01/15 150/86  05/15/15 129/78    Physical Exam: Constitutional: Patient appears well-developed and well-nourished. No distress. AAOx3. pleasant HENT: Normocephalic, atraumatic, External right and left ear normal. Oropharynx is clear and moist.  bilat TMs w/ cerumen impaction. Eyes: Conjunctivae and EOM are normal. PERRL, no scleral icterus. Neck: Normal ROM. Neck supple. No JVD. No tracheal deviation. No thyromegaly. CVS: RRR, S1/S2 +, no murmurs, no gallops, no carotid bruit.  Pulmonary: Effort and breath sounds normal, no stridor, rhonchi, wheezes, rales.  Abdominal: Soft. BS +, no distension, tenderness, rebound or guarding.  Musculoskeletal: Normal range of motion. No  edema and no tenderness.   Foot exam: +varicose veins bilat, bilateral peripheral pulses 2+ (dorsalis pedis and post tibialis pulses), no ulcers noted/no ecchymosis, warm to touch, monofilament testing 3/3 bilat. Sensation intact.  No c/c/e.  Neuro: Alert.  muscle tone coordination wnl. No cranial nerve deficit grossly. Skin: Skin is warm and dry. No rash noted. Not diaphoretic. No erythema. No pallor. Psychiatric: Normal mood and affect. Behavior, judgment, thought content normal.  Lab Results  Component Value Date   WBC 7.8 11/01/2015   HGB 15.5 11/01/2015   HCT 44.4 11/01/2015   MCV 92.7 11/01/2015   PLT 178 11/01/2015   Lab Results  Component Value Date   CREATININE 1.02 11/01/2015   BUN 9 11/01/2015   NA 134* 11/01/2015   K 4.4 11/01/2015   CL 101 11/01/2015   CO2 24 11/01/2015    Lab Results  Component Value Date   HGBA1C 7.5* 11/01/2015   Lipid Panel     Component Value Date/Time   CHOL 107 11/26/2014 0759   TRIG 102.0 11/26/2014 0759   HDL 27.50* 11/26/2014 0759   CHOLHDL 4 11/26/2014 0759   VLDL 20.4 11/26/2014 0759   LDLCALC 59 11/26/2014 0759       Depression screen PHQ 2/9 11/18/2015 09/27/2014 09/14/2014  Decreased Interest 0 0 0  Down, Depressed, Hopeless 0 0 0  PHQ - 2 Score 0 0 0  Altered sleeping 0 - -  Tired, decreased energy 0 - -  Change in appetite 0 - -  Feeling bad or failure about yourself  0 - -  Trouble concentrating 0 - -  Moving slowly or fidgety/restless 0 - -  Suicidal thoughts 0 - -  PHQ-9 Score 0 - -  Difficult doing work/chores Not difficult at all - -    Assessment and plan:   1. Type 2 diabetes mellitus without complication, without long-term current use of insulin (HCC) Has mainly been diet controlled,  - Ambulatory referral to Ophthalmology - Microalbumin/Creatinine Ratio, Urine - Lipid Panel - a1c 7.5 - glu  175 - dw diet /increase exercise - start metoformin xr 500qd - recd chk cbg at home bid for now  2. NSTEMI,  initial episode of care [facility]) Currently back on Brillanta samples  3. Screen for colon cancer - Ambulatory referral to Gastroenterology - colonoscopy  4. Cerumen impaction, bilateral -debrox bilat  5. tob abuse  1pack every few days - complete cessation recd  6. htn -diet controlled -recd DASH diet 7. Financial assistance  - chk on medicaid status, may need financial packet if has nothing currently.  8. Podiatry visit - make appt w/ [facility] podiatry     Return in about 3 months (around  02/18/2016).  The patient was given clear instructions to go to ER or return to medical center if symptoms don't improve, worsen or new problems develop. The patient verbalized understanding. The patient was told to call to get lab results if they haven't heard anything in the next week.    This note has been created with Education officer, environmental. Any transcriptional errors are unintentional.   Pete Glatter, MD, MBA/MHA [facility] [place], [place] [phone]   11/18/2015, 9:34 AM

## 2015-11-19 ENCOUNTER — Other Ambulatory Visit: Payer: Self-pay | Admitting: Internal Medicine

## 2015-11-19 LAB — MICROALBUMIN / CREATININE URINE RATIO
CREATININE, URINE: 390 mg/dL — AB (ref 20–370)
Microalb Creat Ratio: 2 mcg/mg creat (ref <30)
Microalb, Ur: 0.8 mg/dL

## 2015-11-19 MED ORDER — PRAVASTATIN SODIUM 20 MG PO TABS: 20.0000 mg | ORAL_TABLET | Freq: Every day | ORAL | Status: DC

## 2015-11-20 ENCOUNTER — Telehealth: Payer: Self-pay

## 2015-11-20 NOTE — Telephone Encounter (Signed)
Contacted patient to go over lab results pt did not answer lvm for patient to give me a call at his earliest convenience

## 2015-11-20 NOTE — Telephone Encounter (Signed)
Patient returned call and is aware of lab results.

## 2015-11-22 NOTE — Telephone Encounter (Signed)
Charted phone note

## 2015-11-28 ENCOUNTER — Other Ambulatory Visit (INDEPENDENT_AMBULATORY_CARE_PROVIDER_SITE_OTHER): Payer: Self-pay | Admitting: *Deleted

## 2015-11-28 DIAGNOSIS — M7989 Other specified soft tissue disorders: Secondary | ICD-10-CM

## 2015-11-28 LAB — BRAIN NATRIURETIC PEPTIDE: Brain Natriuretic Peptide: 31.1 pg/mL (ref <100)

## 2015-11-28 LAB — BASIC METABOLIC PANEL
BUN: 15 mg/dL (ref 7–25)
CALCIUM: 9.2 mg/dL (ref 8.6–10.3)
CO2: 24 mmol/L (ref 20–31)
CREATININE: 1.36 mg/dL — AB (ref 0.70–1.33)
Chloride: 102 mmol/L (ref 98–110)
Glucose, Bld: 241 mg/dL — ABNORMAL HIGH (ref 65–99)
Potassium: 3.4 mmol/L — ABNORMAL LOW (ref 3.5–5.3)
Sodium: 138 mmol/L (ref 135–146)

## 2015-11-28 NOTE — Addendum Note (Signed)
Addended by: Tonita PhoenixBOWDEN, Adreanne Yono K on: 11/28/2015 08:00 AM   Modules accepted: Orders

## 2015-11-29 ENCOUNTER — Telehealth: Payer: Self-pay | Admitting: Internal Medicine

## 2015-11-29 ENCOUNTER — Other Ambulatory Visit: Payer: Self-pay | Admitting: *Deleted

## 2015-11-29 DIAGNOSIS — R7989 Other specified abnormal findings of blood chemistry: Secondary | ICD-10-CM

## 2015-11-29 DIAGNOSIS — I10 Essential (primary) hypertension: Secondary | ICD-10-CM

## 2015-11-29 NOTE — Telephone Encounter (Signed)
Spoke with patient regarding lab results. 

## 2015-11-29 NOTE — Telephone Encounter (Signed)
New message ° ° ° ° °Returning a call to the nurse °

## 2015-12-10 ENCOUNTER — Ambulatory Visit (HOSPITAL_COMMUNITY)
Admission: EM | Admit: 2015-12-10 | Discharge: 2015-12-10 | Disposition: A | Discharge status: Home / Self Care | Payer: Self-pay | Attending: Family Medicine | Admitting: Family Medicine

## 2015-12-10 ENCOUNTER — Encounter (HOSPITAL_COMMUNITY): Payer: Self-pay | Admitting: Emergency Medicine

## 2015-12-10 DIAGNOSIS — K0889 Other specified disorders of teeth and supporting structures: Secondary | ICD-10-CM

## 2015-12-10 MED ORDER — CLINDAMYCIN HCL 300 MG PO CAPS: 300.0000 mg | ORAL_CAPSULE | Freq: Three times a day (TID) | ORAL | 0 refills | Status: DC

## 2015-12-10 MED ORDER — TRAMADOL HCL 50 MG PO TABS: 50.0000 mg | ORAL_TABLET | Freq: Four times a day (QID) | ORAL | 0 refills | Status: DC | PRN

## 2015-12-10 NOTE — ED Triage Notes (Signed)
Patient has an abscess tooth, history of the same

## 2015-12-10 NOTE — ED Provider Notes (Signed)
[facility]    CSN: [id number] Arrival date & time: 12/10/15  1022  First Provider Contact:  None       History   Chief Complaint No chief complaint on file.   HPI William Young is a 55 y.o. male.    Dental Pain  Location:  Lower Lower teeth location:  27/RL cuspid Quality:  Throbbing Severity:  Moderate Duration:  1 day Progression:  Worsening Chronicity:  Recurrent Context: abscess, dental caries and poor dentition   Context comment:  Pt of dr Chales Salmon Relieved by:  None tried Ineffective treatments:  None tried Associated symptoms: facial swelling   Associated symptoms: no fever   Risk factors: lack of dental care     Past Medical History:  Diagnosis Date  . CAD (coronary artery disease) 09/2014   a. NSTEMI >> LHC 5/16:  mLAD 15, pRCA 90, mRCA 40, RPLB3 80, EF normal with inf HK >> PCI:  BMS to pRCA and DES to RPLB3   . Diabetes mellitus without complication (HCC)   . GERD (gastroesophageal reflux disease)   . History of echocardiogram    Echo 6/16:  EF 50-55%, no RWMA, Gr 1 DD  . HTN (hypertension)   . Hyperglycemia   . OSA (obstructive sleep apnea)   . Tobacco use     Patient Active Problem List   Diagnosis Date Noted  . HLD (hyperlipidemia) 09/28/2014  . Diabetes mellitus (HCC) 09/14/2014  . Obstructive sleep apnea 09/14/2014  . Morbid obesity (HCC) 09/12/2014  . Coronary artery disease   . NSTEMI, initial episode of care (HCC) 09/10/2014  . Essential hypertension 09/10/2014  . Hyperglycemia   . Tobacco use   . GERD (gastroesophageal reflux disease)     Past Surgical History:  Procedure Laterality Date  . CARDIAC CATHETERIZATION N/A 09/11/2014   Procedure: Left Heart Cath and Coronary Angiography;  Surgeon: Marykay Lex, MD;  Location: [facility] INVASIVE CV LAB CUPID;  Service: Cardiovascular;  Laterality: N/A;  . CARDIAC CATHETERIZATION  09/11/2014   Procedure: Coronary Stent Intervention;  Surgeon: Marykay Lex, MD;  Location: [facility]  INVASIVE CV LAB CUPID;  Service: Cardiovascular;;  . None         Home Medications    Prior to Admission medications   Medication Sig Start Date End Date Taking? Authorizing Provider  carbamide peroxide (DEBROX) 6.5 % otic solution Place 5 drops into both ears 2 (two) times daily. 11/18/15   Pete Glatter, MD  glucose blood test strip Use as instructed 11/18/15   Pete Glatter, MD  metFORMIN (GLUCOPHAGE XR) 500 MG 24 hr tablet Take 1 tablet (500 mg total) by mouth daily with breakfast. 11/18/15   Pete Glatter, MD  pravastatin (PRAVACHOL) 20 MG tablet Take 1 tablet (20 mg total) by mouth daily. 11/19/15   Pete Glatter, MD  ticagrelor (BRILINTA) 90 MG TABS tablet Take by mouth 2 (two) times daily.    Historical Provider, MD    Family History Family History  Problem Relation Age of Onset  . Heart attack Maternal Uncle   . Heart attack Paternal Grandmother   . Cancer Maternal Grandfather   . Heart attack Maternal Grandmother   . Stroke Neg Hx   . Hypertension Neg Hx     Social History Social History  Substance Use Topics  . Smoking status: Current Every Day Smoker    Packs/day: 1.00    Years: 35.00    Types: Cigarettes    Last attempt  to quit: 09/10/2014  . Smokeless tobacco: Never Used  . Alcohol use 0.0 oz/week     Comment: occassional     Allergies   Aspirin and Ibuprofen   Review of Systems Review of Systems  Constitutional: Negative for fever.  HENT: Positive for dental problem and facial swelling.   All other systems reviewed and are negative.    Physical Exam Triage Vital Signs ED Triage Vitals [12/10/15 1105]  Enc Vitals Group     BP 129/91     Pulse Rate 89     Resp 16     Temp 98.5 F (36.9 C)     Temp Source Oral     SpO2 97 %     Weight      Height      Head Circumference      Peak Flow      Pain Score      Pain Loc      Pain Edu?      Excl. in GC?    No data found.   Updated Vital Signs BP 129/91 (BP Location: Right Arm)    Pulse 89   Temp 98.5 F (36.9 C) (Oral)   Resp 16   SpO2 97%   Visual Acuity Right Eye Distance:   Left Eye Distance:   Bilateral Distance:    Right Eye Near:   Left Eye Near:    Bilateral Near:     Physical Exam  Constitutional: He appears well-developed and well-nourished. No distress.  HENT:  Mouth/Throat: Abnormal dentition. Dental abscesses present.    Nursing note and vitals reviewed.    UC Treatments / Results  Labs (all labs ordered are listed, but only abnormal results are displayed) Labs Reviewed - No data to display  EKG  EKG Interpretation None       Radiology No results found.  Procedures Procedures (including critical care time)  Medications Ordered in UC Medications - No data to display   Initial Impression / Assessment and Plan / UC Course  I have reviewed the triage vital signs and the nursing notes.  Pertinent labs & imaging results that were available during my care of the patient were reviewed by me and considered in my medical decision making (see chart for details).  Clinical Course      Final Clinical Impressions(s) / UC Diagnoses   Final diagnoses:  None    New Prescriptions New Prescriptions   No medications on file     Linna Hoff, MD 12/17/15 2049

## 2015-12-27 ENCOUNTER — Encounter (INDEPENDENT_AMBULATORY_CARE_PROVIDER_SITE_OTHER): Payer: Self-pay

## 2015-12-27 ENCOUNTER — Other Ambulatory Visit: Payer: Self-pay | Admitting: *Deleted

## 2015-12-27 DIAGNOSIS — I10 Essential (primary) hypertension: Secondary | ICD-10-CM

## 2015-12-27 DIAGNOSIS — R7989 Other specified abnormal findings of blood chemistry: Secondary | ICD-10-CM

## 2015-12-27 LAB — BASIC METABOLIC PANEL
BUN: 5 mg/dL — ABNORMAL LOW (ref 7–25)
CALCIUM: 8.6 mg/dL (ref 8.6–10.3)
CO2: 22 mmol/L (ref 20–31)
CREATININE: 0.75 mg/dL (ref 0.70–1.33)
Chloride: 110 mmol/L (ref 98–110)
Glucose, Bld: 144 mg/dL — ABNORMAL HIGH (ref 65–99)
Potassium: 4 mmol/L (ref 3.5–5.3)
Sodium: 139 mmol/L (ref 135–146)

## 2016-09-11 ENCOUNTER — Ambulatory Visit (INDEPENDENT_AMBULATORY_CARE_PROVIDER_SITE_OTHER): Payer: Self-pay | Admitting: Internal Medicine

## 2016-09-11 ENCOUNTER — Encounter: Payer: Self-pay | Admitting: Internal Medicine

## 2016-09-11 VITALS — BP 152/94 | HR 86 | Ht 76.0 in | Wt 289.4 lb

## 2016-09-11 DIAGNOSIS — I2510 Atherosclerotic heart disease of native coronary artery without angina pectoris: Secondary | ICD-10-CM

## 2016-09-11 DIAGNOSIS — I10 Essential (primary) hypertension: Secondary | ICD-10-CM

## 2016-09-11 DIAGNOSIS — E785 Hyperlipidemia, unspecified: Secondary | ICD-10-CM

## 2016-09-11 LAB — CBC
Hematocrit: 45.1 % (ref 37.5–51.0)
Hemoglobin: 15.8 g/dL (ref 13.0–17.7)
MCH: 32.6 pg (ref 26.6–33.0)
MCHC: 35 g/dL (ref 31.5–35.7)
MCV: 93 fL (ref 79–97)
Platelets: 188 10*3/uL (ref 150–379)
RBC: 4.85 x10E6/uL (ref 4.14–5.80)
RDW: 14.1 % (ref 12.3–15.4)
WBC: 8.5 10*3/uL (ref 3.4–10.8)

## 2016-09-11 LAB — COMPREHENSIVE METABOLIC PANEL
A/G RATIO: 1.7 (ref 1.2–2.2)
ALT: 25 IU/L (ref 0–44)
AST: 18 IU/L (ref 0–40)
Albumin: 4.5 g/dL (ref 3.5–5.5)
Alkaline Phosphatase: 89 IU/L (ref 39–117)
BILIRUBIN TOTAL: 0.5 mg/dL (ref 0.0–1.2)
BUN / CREAT RATIO: 13 (ref 9–20)
BUN: 12 mg/dL (ref 6–24)
CO2: 20 mmol/L (ref 18–29)
Calcium: 9.5 mg/dL (ref 8.7–10.2)
Chloride: 99 mmol/L (ref 96–106)
Creatinine, Ser: 0.9 mg/dL (ref 0.76–1.27)
GFR calc non Af Amer: 96 mL/min/{1.73_m2} (ref >59)
GFR, EST AFRICAN AMERICAN: 111 mL/min/{1.73_m2} (ref >59)
Globulin, Total: 2.6 g/dL (ref 1.5–4.5)
Glucose: 157 mg/dL — ABNORMAL HIGH (ref 65–99)
POTASSIUM: 4.9 mmol/L (ref 3.5–5.2)
Sodium: 138 mmol/L (ref 134–144)
Total Protein: 7.1 g/dL (ref 6.0–8.5)

## 2016-09-11 LAB — LIPID PANEL
CHOL/HDL RATIO: 5 ratio (ref 0.0–5.0)
CHOLESTEROL TOTAL: 219 mg/dL — AB (ref 100–199)
HDL: 44 mg/dL (ref >39)
LDL CALC: 154 mg/dL — AB (ref 0–99)
Triglycerides: 105 mg/dL (ref 0–149)
VLDL CHOLESTEROL CAL: 21 mg/dL (ref 5–40)

## 2016-09-11 LAB — HEMOGLOBIN A1C
ESTIMATED AVERAGE GLUCOSE: 140 mg/dL
HEMOGLOBIN A1C: 6.5 % — AB (ref 4.8–5.6)

## 2016-09-11 LAB — TSH: TSH: 2.37 u[IU]/mL (ref 0.450–4.500)

## 2016-09-11 NOTE — Patient Instructions (Signed)
Your physician recommends that you return for lab work today (cmet, hga1c, cbc, tsh, lipids)  You have been referred to Vein and Vascular Surgery  Your physician wants you to follow-up in: August, 2018 with Dr. Tenny Crawoss.  You will receive a reminder letter in the mail two months in advance. If you don't receive a letter, please call our office to schedule the follow-up appointment.

## 2016-09-11 NOTE — Progress Notes (Addendum)
Cardiology Office Note   Date:  09/11/2016   ID:  William Young, DOB 04/17/1961, MRN [id number]  PCP:  No PCP Per Patient  Cardiologist:   Dietrich PatesPaula Malaka Ruffner, MD   F/U of CAD     History of Present Illness: William Young is a 56 y.o. male with a history of CAD, x/p NSTEMI  May 2016  Cath showed 90% prox RCAand 80% R PLSA.  Underent PCI/BMS to RCA and PCI/DES to PLSA  Hx ASA allergy  Rx Brilinta for 1 yr then Plavix to be considered  LVEF normal       I saw him in June 2017  He had run out of meds at that time  Was smoking   Since seen he continues to smoke  He says that he has been off of all his meds for awhile   Deneis CP  Breathing is OK  Still smoking less than a ppd  Some swelling in legs (R) with veins    Outpatient Medications Prior to Visit  Medication Sig Dispense Refill  . carbamide peroxide (DEBROX) 6.5 % otic solution Place 5 drops into both ears 2 (two) times daily. 15 mL 0  . glucose blood test strip Use as instructed 100 each 12  . metFORMIN (GLUCOPHAGE XR) 500 MG 24 hr tablet Take 1 tablet (500 mg total) by mouth daily with breakfast. 90 tablet 3  . pravastatin (PRAVACHOL) 20 MG tablet Take 1 tablet (20 mg total) by mouth daily. 90 tablet 3  . ticagrelor (BRILINTA) 90 MG TABS tablet Take by mouth 2 (two) times daily.    . traMADol (ULTRAM) 50 MG tablet Take 1 tablet (50 mg total) by mouth every 6 (six) hours as needed. 20 tablet 0  . clindamycin (CLEOCIN) 300 MG capsule Take 1 capsule (300 mg total) by mouth 3 (three) times daily. (Patient not taking: Reported on 09/11/2016) 21 capsule 0   No facility-administered medications prior to visit.      Allergies:   Aspirin and Ibuprofen   Past Medical History:  Diagnosis Date  . CAD (coronary artery disease) 09/2014   a. NSTEMI >> LHC 5/16:  mLAD 15, pRCA 90, mRCA 40, RPLB3 80, EF normal with inf HK >> PCI:  BMS to pRCA and DES to RPLB3   . Diabetes mellitus without complication (HCC)   . GERD (gastroesophageal reflux  disease)   . History of echocardiogram    Echo 6/16:  EF 50-55%, no RWMA, Gr 1 DD  . HTN (hypertension)   . Hyperglycemia   . OSA (obstructive sleep apnea)   . Tobacco use     Past Surgical History:  Procedure Laterality Date  . CARDIAC CATHETERIZATION N/A 09/11/2014   Procedure: Left Heart Cath and Coronary Angiography;  Surgeon: Marykay Lexavid W Harding, MD;  [facility];  Service: Cardiovascular;  Laterality: N/A;  . CARDIAC CATHETERIZATION  09/11/2014   Procedure: Coronary Stent Intervention;  Surgeon: Marykay Lexavid W Harding, MD;  [facility];  Service: Cardiovascular;;  . None       Social History:  The patient  reports that he has been smoking Cigarettes.  He has a 35.00 pack-year smoking history. He has never used smokeless tobacco. He reports that he drinks alcohol. He reports that he does not use drugs.   Family History:  The patient's family history includes Cancer in his maternal grandfather; Heart attack in his maternal grandmother, maternal uncle, and paternal grandmother.  ROS:  Please see the history of present illness. All other systems are reviewed and  Negative to the above problem except as noted.    PHYSICAL EXAM: VS:  BP (!) 152/94   Pulse 86   Ht 6\' 4"  (1.93 m)   Wt 289 lb 6.4 oz (131.3 kg)   BMI 35.23 kg/m   GEN:  Morbidly obese 56 yo in no acute distress HEENT: normal Neck: no JVD, carotid bruits, or masses Cardiac: RRR; no murmurs, rubs, or gallops, triv edema  Respiratory:  clear to auscultation bilaterally, normal work of breathing GI: soft, nontender, nondistended, + BS  No hepatomegaly  MS: no deformity Moving all extremities  Varicose veins R greater than L  Chronic skin changes esp L ankle     Neuro:  Strength and sensation are intact Psych: euthymic mood, full affect   EKG:  EKG is ordered today.   SR 86 bpm  LAFB     Lipid Panel    Component Value Date/Time   CHOL 200 11/18/2015 0940   TRIG 165 (H)  11/18/2015 0940   HDL 33 (L) 11/18/2015 0940   CHOLHDL 6.1 (H) 11/18/2015 0940   VLDL 33 (H) 11/18/2015 0940   LDLCALC 134 (H) 11/18/2015 0940      Wt Readings from Last 3 Encounters:  09/11/16 289 lb 6.4 oz (131.3 kg)  11/18/15 (!) 302 lb (137 kg)  11/01/15 (!) 304 lb 12.8 oz (138.3 kg)      ASSESSMENT AND PLAN:  1  CAD   No symptoms of angina  Off of meds   2.  HTN   BP is up  He needs to resume something  Will check labs    3.  DM   Will check Hgb A1C  4.  HL   Will check lpids    5.  Sleep apnea  Needs CPAP  6  Varicose veins  Will refer to [facility] to evaluate LE  7  Tob  Counselled on cessation.     Wiill try to reestablish him in [facility]    Signed, Dietrich Pates, MD  09/11/2016 9:39 AM    [facility] [address] [place], [place], [place]  [address] Phone: [phone]; Fax: [phone]

## 2016-09-15 ENCOUNTER — Telehealth: Payer: Self-pay | Admitting: Internal Medicine

## 2016-09-15 DIAGNOSIS — E782 Mixed hyperlipidemia: Secondary | ICD-10-CM

## 2016-09-15 DIAGNOSIS — I10 Essential (primary) hypertension: Secondary | ICD-10-CM

## 2016-09-15 NOTE — Telephone Encounter (Signed)
ollow Up:   Returning your call,concerning his lab results.

## 2016-09-16 MED ORDER — LOSARTAN POTASSIUM 50 MG PO TABS: 50.0000 mg | ORAL_TABLET | Freq: Every day | ORAL | 3 refills | Status: DC

## 2016-09-16 MED ORDER — ATORVASTATIN CALCIUM 40 MG PO TABS
40.0000 mg | ORAL_TABLET | Freq: Every day | ORAL | 3 refills | Status: DC
Start: 2016-09-16 — End: 2017-03-16

## 2016-09-16 MED FILL — LOSARTAN POTASSIUM 50 MG TA: 50 | 30 days supply | Qty: 30 | Fill #0

## 2016-09-16 MED FILL — ATORVASTATIN 40 MG TABLET: 40 | 30 days supply | Qty: 30 | Fill #0

## 2016-09-16 NOTE — Telephone Encounter (Signed)
Left message for patient to call back  

## 2016-09-16 NOTE — Telephone Encounter (Signed)
Tried to reach patient again. No answer.

## 2016-09-16 NOTE — Telephone Encounter (Signed)
-----   Message from Pricilla RifflePaula Ross V, MD sent at 09/14/2016  2:08 PM EDT ----- CBC is normal  Kidney function is OK  Glu is up A1C  Is 6.5  Needs to watch sugars Lipids abnormal  With LDL approx 154  Recomm:  Cozaar 50 mg for BP  Check BMET  10 days  Helpful to kidneys as well with dn F/U in cliic in 1 month Recomm Lipitor 40 mg for lipids LDL is high  F/U  Lipid panel in 2 months

## 2016-09-16 NOTE — Telephone Encounter (Signed)
Reviewed with patient. He agrees to take losartan 50 mg daily and lipitor 40 mg daily. Labs on 5/21 (BMET) and 7/9 (lipids). [facility]  Follow up appointment made with Dr. Tenny Crawoss for 1 month.

## 2016-09-27 ENCOUNTER — Emergency Department (HOSPITAL_COMMUNITY)
Admission: EM | Admit: 2016-09-27 | Discharge: 2016-09-27 | Disposition: A | Discharge status: Home / Self Care | Payer: Self-pay | Attending: Emergency Medicine | Admitting: Emergency Medicine

## 2016-09-27 ENCOUNTER — Encounter (HOSPITAL_COMMUNITY): Payer: Self-pay | Admitting: Emergency Medicine

## 2016-09-27 DIAGNOSIS — Y92009 Unspecified place in unspecified non-institutional (private) residence as the place of occurrence of the external cause: Secondary | ICD-10-CM | POA: Insufficient documentation

## 2016-09-27 DIAGNOSIS — I252 Old myocardial infarction: Secondary | ICD-10-CM | POA: Insufficient documentation

## 2016-09-27 DIAGNOSIS — Z23 Encounter for immunization: Secondary | ICD-10-CM | POA: Insufficient documentation

## 2016-09-27 DIAGNOSIS — W010XXA Fall on same level from slipping, tripping and stumbling without subsequent striking against object, initial encounter: Secondary | ICD-10-CM | POA: Insufficient documentation

## 2016-09-27 DIAGNOSIS — S91111A Laceration without foreign body of right great toe without damage to nail, initial encounter: Secondary | ICD-10-CM | POA: Insufficient documentation

## 2016-09-27 DIAGNOSIS — Y9301 Activity, walking, marching and hiking: Secondary | ICD-10-CM | POA: Insufficient documentation

## 2016-09-27 DIAGNOSIS — Y999 Unspecified external cause status: Secondary | ICD-10-CM | POA: Insufficient documentation

## 2016-09-27 DIAGNOSIS — F17210 Nicotine dependence, cigarettes, uncomplicated: Secondary | ICD-10-CM | POA: Insufficient documentation

## 2016-09-27 DIAGNOSIS — E119 Type 2 diabetes mellitus without complications: Secondary | ICD-10-CM | POA: Insufficient documentation

## 2016-09-27 DIAGNOSIS — I10 Essential (primary) hypertension: Secondary | ICD-10-CM | POA: Insufficient documentation

## 2016-09-27 DIAGNOSIS — I2510 Atherosclerotic heart disease of native coronary artery without angina pectoris: Secondary | ICD-10-CM | POA: Insufficient documentation

## 2016-09-27 MED ORDER — LIDOCAINE HCL 2 % IJ SOLN
10.0000 mL | Freq: Once | INTRAMUSCULAR | Status: AC
  Administered 2016-09-27: 200 mg via INTRADERMAL
  Filled 2016-09-27: qty 20

## 2016-09-27 MED ORDER — TETANUS-DIPHTH-ACELL PERTUSSIS 5-2.5-18.5 LF-MCG/0.5 IM SUSP
0.5000 mL | Freq: Once | INTRAMUSCULAR | Status: AC
  Administered 2016-09-27: 0.5 mL via INTRAMUSCULAR
  Filled 2016-09-27: qty 0.5

## 2016-09-27 MED ORDER — CEPHALEXIN 500 MG PO CAPS: 500.0000 mg | ORAL_CAPSULE | Freq: Four times a day (QID) | ORAL | 0 refills | Status: DC

## 2016-09-27 NOTE — ED Notes (Signed)
Bed: WTR6 Expected date:  Expected time:  Means of arrival:  Comments: 

## 2016-09-27 NOTE — ED Triage Notes (Signed)
Patient here from home with complaints of right toe laceration after walking outside this am. Not sure what toe cut is from. Bleeding controlled. Hx. Of diabetes.

## 2016-09-27 NOTE — ED Provider Notes (Signed)
[facility]EMERGENC[facility] DEPT Provider Note   CSN: [id number] Arrival date & time: 09/27/16  4098     History   Chief Complaint Chief Complaint  Patient presents with  . Extremity Laceration    HPI William Young is a 56 y.o. male who presents with a right great toe laceration. PMH significant Type 2 DM. He states he got off of work and went outside in his slippers to get the mail and when he went back in the house he tripped on his steps and fell. He was able to get up and walk in the house and had minimal pain. He then noticed that he was bleeding through his slipper and there was a significant amount of blood tracked through the house. He applied pressure and tried to soak the wound and could not control the bleeding so he came to the ED for further evaluation. He is not on blood thinners. His tetanus is not up to date.  HPI  Past Medical History:  Diagnosis Date  . CAD (coronary artery disease) 09/2014   a. NSTEMI >> LHC 5/16:  mLAD 15, pRCA 90, mRCA 40, RPLB3 80, EF normal with inf HK >> PCI:  BMS to pRCA and DES to RPLB3   . Diabetes mellitus without complication (HCC)   . GERD (gastroesophageal reflux disease)   . History of echocardiogram    Echo 6/16:  EF 50-55%, no RWMA, Gr 1 DD  . HTN (hypertension)   . Hyperglycemia   . OSA (obstructive sleep apnea)   . Tobacco use     Patient Active Problem List   Diagnosis Date Noted  . HLD (hyperlipidemia) 09/28/2014  . Diabetes mellitus (HCC) 09/14/2014  . Obstructive sleep apnea 09/14/2014  . Morbid obesity (HCC) 09/12/2014  . Coronary artery disease   . NSTEMI, initial episode of care (HCC) 09/10/2014  . Essential hypertension 09/10/2014  . Hyperglycemia   . Tobacco use   . GERD (gastroesophageal reflux disease)     Past Surgical History:  Procedure Laterality Date  . CARDIAC CATHETERIZATION N/A 09/11/2014   Procedure: Left Heart Cath and Coronary Angiography;  Surgeon: Marykay Lex, MD;  Location: [facility] INVASIVE CV LAB CUPID;   Service: Cardiovascular;  Laterality: N/A;  . CARDIAC CATHETERIZATION  09/11/2014   Procedure: Coronary Stent Intervention;  Surgeon: Marykay Lex, MD;  Location: [facility] INVASIVE CV LAB CUPID;  Service: Cardiovascular;;  . None         Home Medications    Prior to Admission medications   Medication Sig Start Date End Date Taking? Authorizing Provider  atorvastatin (LIPITOR) 40 MG tablet Take 1 tablet (40 mg total) by mouth daily. 09/16/16 12/15/16  [name], MD  losartan (COZAAR) 50 MG tablet Take 1 tablet (50 mg total) by mouth daily. 09/16/16 12/15/16  [name], MD    Family History Family History  Problem Relation Age of Onset  . Heart attack Maternal Uncle   . Heart attack Paternal Grandmother   . Cancer Maternal Grandfather   . Heart attack Maternal Grandmother   . Stroke Neg Hx   . Hypertension Neg Hx     Social History Social History  Substance Use Topics  . Smoking status: Current Every Day Smoker    Packs/day: 1.00    Years: 35.00    Types: Cigarettes    Last attempt to quit: 09/10/2014  . Smokeless tobacco: Never Used  . Alcohol use 0.0 oz/week     Comment: occassional  Allergies   Aspirin and Ibuprofen   Review of Systems Review of Systems  Musculoskeletal: Negative for gait problem.  Skin: Positive for wound.  Neurological: Negative for weakness and numbness.     Physical Exam Updated Vital Signs BP 134/68 (BP Location: Left Arm)   Pulse 78   Temp 98.2 F (36.8 C) (Oral)   Resp 18   SpO2 97%   Physical Exam  Constitutional: He is oriented to person, place, and time. He appears well-developed and well-nourished. No distress.  HENT:  Head: Normocephalic and atraumatic.  Eyes: Conjunctivae are normal. Pupils are equal, round, and reactive to light. Right eye exhibits no discharge. Left eye exhibits no discharge. No scleral icterus.  Neck: Normal range of motion.  Cardiovascular: Normal rate.   Pulmonary/Chest: Effort normal. No  respiratory distress.  Abdominal: He exhibits no distension.  Neurological: He is alert and oriented to person, place, and time.  Skin: Skin is warm and dry.  4cm U shaped laceration on plantar aspect of right great toe. Bleeding is controlled  Psychiatric: He has a normal mood and affect. His behavior is normal.  Nursing note and vitals reviewed.    ED Treatments / Results  Labs (all labs ordered are listed, but only abnormal results are displayed) Labs Reviewed - No data to display  EKG  EKG Interpretation None       Radiology No results found.  Procedures Procedures (including critical care time)  Medications Ordered in ED Medications  lidocaine (XYLOCAINE) 2 % (with pres) injection 200 mg (200 mg Intradermal Given 09/27/16 1036)  Tdap (BOOSTRIX) injection 0.5 mL (0.5 mLs Intramuscular Given 09/27/16 1147)     Initial Impression / Assessment and Plan / ED Course  I have reviewed the triage vital signs and the nursing notes.  Pertinent labs & imaging results that were available during my care of the patient were reviewed by me and considered in my medical decision making (see chart for details).  56 year old male with toe laceration. It was repaired and irrigated in the ED. Bottom of the wound visualized and bleeding controlled. 6 sutures placed. Plan is to DC home with Keflex and removed stitches in 7 days. Wound care discussed. Tdap was updated. Return precautions discussed.   Final Clinical Impressions(s) / ED Diagnoses   Final diagnoses:  Laceration of right great toe without foreign body present or damage to nail, initial encounter    New Prescriptions New Prescriptions   No medications on file     Bethel BornGekas, Haskel Dewalt Marie, PA-C 09/27/16 1301    Little, Ambrose Finlandachel Morgan, MD 09/28/16 [phone]

## 2016-09-27 NOTE — Discharge Instructions (Signed)
Have stitches removed in one week.  Take antibiotic to prevent infection  WOUND CARE  Keep area clean and dry for 24 hours. Do not remove bandage, if applied.  After 24 hours, remove bandage and wash wound gently with mild soap and warm water. Reapply a new bandage after cleaning wound, if directed.   Continue daily cleansing with soap and water until stitches/staples are removed.  Do not apply any ointments or creams to the wound while stitches/staples are in place, as this may cause delayed healing. Return if you experience any of the following signs of infection: Swelling, redness, pus drainage, streaking, fever >101.0 F  Return if you experience excessive bleeding that does not stop after 15-20 minutes of constant, firm pressure.

## 2016-09-28 ENCOUNTER — Other Ambulatory Visit: Payer: Self-pay

## 2016-09-29 ENCOUNTER — Other Ambulatory Visit: Payer: Self-pay

## 2016-09-29 DIAGNOSIS — I10 Essential (primary) hypertension: Secondary | ICD-10-CM

## 2016-09-29 DIAGNOSIS — E782 Mixed hyperlipidemia: Secondary | ICD-10-CM

## 2016-09-30 LAB — BASIC METABOLIC PANEL
BUN/Creatinine Ratio: 18 (ref 9–20)
BUN: 16 mg/dL (ref 6–24)
CO2: 22 mmol/L (ref 18–29)
Calcium: 8.9 mg/dL (ref 8.7–10.2)
Chloride: 106 mmol/L (ref 96–106)
Creatinine, Ser: 0.87 mg/dL (ref 0.76–1.27)
GFR calc non Af Amer: 96 mL/min/{1.73_m2} (ref >59)
GFR, EST AFRICAN AMERICAN: 112 mL/min/{1.73_m2} (ref >59)
GLUCOSE: 132 mg/dL — AB (ref 65–99)
POTASSIUM: 4.4 mmol/L (ref 3.5–5.2)
SODIUM: 141 mmol/L (ref 134–144)

## 2016-10-06 ENCOUNTER — Ambulatory Visit (HOSPITAL_COMMUNITY)
Admission: EM | Admit: 2016-10-06 | Discharge: 2016-10-06 | Disposition: A | Discharge status: Home / Self Care | Payer: Self-pay | Attending: Family Medicine | Admitting: Family Medicine

## 2016-10-06 ENCOUNTER — Encounter (HOSPITAL_COMMUNITY): Payer: Self-pay | Admitting: *Deleted

## 2016-10-06 DIAGNOSIS — Z4802 Encounter for removal of sutures: Secondary | ICD-10-CM

## 2016-10-06 NOTE — Discharge Instructions (Signed)
No special wound care needed at this point.  The dry glue will flake off in several days.

## 2016-10-06 NOTE — ED Triage Notes (Signed)
Here  For  Suture  Removal  Sutures  Have  Been  In  For  9  Days    Verbalizes  No  Complaints

## 2016-10-06 NOTE — ED Provider Notes (Signed)
[facility]    CSN: [id number] Arrival date & time: 10/06/16  1027     History   Chief Complaint Chief Complaint  Patient presents with  . Suture / Staple Removal    HPI Breyden Jeudy is a 56 y.o. male.   This is a 56 year old gentleman who lacerated his toe (09/27/2016) 9 days ago and is here for today for suture removal.  The note from the emergency department visit:  Jarmarcus Wambold is a 56 y.o. male who presents with a right great toe laceration. PMH significant Type 2 DM. He states he got off of work and went outside in his slippers to get the mail and when he went back in the house he tripped on his steps and fell. He was able to get up and walk in the house and had minimal pain. He then noticed that he was bleeding through his slipper and there was a significant amount of blood tracked through the house. He applied pressure and tried to soak the wound and could not control the bleeding so he came to the ED for further evaluation. He is not on blood thinners. His tetanus is not up to date.       Past Medical History:  Diagnosis Date  . CAD (coronary artery disease) 09/2014   a. NSTEMI >> LHC 5/16:  mLAD 15, pRCA 90, mRCA 40, RPLB3 80, EF normal with inf HK >> PCI:  BMS to pRCA and DES to RPLB3   . Diabetes mellitus without complication (HCC)   . GERD (gastroesophageal reflux disease)   . History of echocardiogram    Echo 6/16:  EF 50-55%, no RWMA, Gr 1 DD  . HTN (hypertension)   . Hyperglycemia   . OSA (obstructive sleep apnea)   . Tobacco use     Patient Active Problem List   Diagnosis Date Noted  . HLD (hyperlipidemia) 09/28/2014  . Diabetes mellitus (HCC) 09/14/2014  . Obstructive sleep apnea 09/14/2014  . Morbid obesity (HCC) 09/12/2014  . Coronary artery disease   . NSTEMI, initial episode of care (HCC) 09/10/2014  . Essential hypertension 09/10/2014  . Hyperglycemia   . Tobacco use   . GERD (gastroesophageal reflux disease)     Past  Surgical History:  Procedure Laterality Date  . CARDIAC CATHETERIZATION N/A 09/11/2014   Procedure: Left Heart Cath and Coronary Angiography;  Surgeon: Marykay Lex, MD;  Location: [facility] INVASIVE CV LAB CUPID;  Service: Cardiovascular;  Laterality: N/A;  . CARDIAC CATHETERIZATION  09/11/2014   Procedure: Coronary Stent Intervention;  Surgeon: Marykay Lex, MD;  Location: [facility] INVASIVE CV LAB CUPID;  Service: Cardiovascular;;  . None         Home Medications    Prior to Admission medications   Medication Sig Start Date End Date Taking? Authorizing Provider  atorvastatin (LIPITOR) 40 MG tablet Take 1 tablet (40 mg total) by mouth daily. 09/16/16 12/15/16  [name], MD  cephALEXin (KEFLEX) 500 MG capsule Take 1 capsule (500 mg total) by mouth 4 (four) times daily. 09/27/16   Bethel Born, PA-C  losartan (COZAAR) 50 MG tablet Take 1 tablet (50 mg total) by mouth daily. 09/16/16 12/15/16  [name], MD    Family History Family History  Problem Relation Age of Onset  . Heart attack Maternal Uncle   . Heart attack Paternal Grandmother   . Cancer Maternal Grandfather   . Heart attack Maternal Grandmother   . Stroke Neg Hx   .  Hypertension Neg Hx     Social History Social History  Substance Use Topics  . Smoking status: Current Every Day Smoker    Packs/day: 1.00    Years: 35.00    Types: Cigarettes    Last attempt to quit: 09/10/2014  . Smokeless tobacco: Never Used  . Alcohol use 0.0 oz/week     Comment: occassional     Allergies   Aspirin and Ibuprofen   Review of Systems Review of Systems   Physical Exam Triage Vital Signs ED Triage Vitals  Enc Vitals Group     BP      Pulse      Resp      Temp      Temp src      SpO2      Weight      Height      Head Circumference      Peak Flow      Pain Score      Pain Loc      Pain Edu?      Excl. in GC?    No data found.   Updated Vital Signs BP (!) 171/106 (BP Location: Right Arm)   Pulse 78   Temp 98.1  F (36.7 C) (Oral)   Resp 18   SpO2 100%   Physical Exam  Constitutional: He appears well-developed and well-nourished.  Skin: Skin is warm.  The left great toe was well healed and the sutures were removed. There is some minimal tenderness but no swelling or erythema.  The wound edges were then Dermabond for extra durability  Nursing note and vitals reviewed.    UC Treatments / Results  Labs (all labs ordered are listed, but only abnormal results are displayed) Labs Reviewed - No data to display  EKG  EKG Interpretation None       Radiology No results found.  Procedures Procedures (including critical care time)  Medications Ordered in UC Medications - No data to display   Initial Impression / Assessment and Plan / UC Course  I have reviewed the triage vital signs and the nursing notes.  Pertinent labs & imaging results that were available during my care of the patient were reviewed by me and considered in my medical decision making (see chart for details).     Final Clinical Impressions(s) / UC Diagnoses   Final diagnoses:  Visit for suture removal    New Prescriptions New Prescriptions   No medications on file     Elvina SidleLauenstein, Zamiyah Resendes, MD 10/06/16 1118

## 2016-10-19 ENCOUNTER — Ambulatory Visit (INDEPENDENT_AMBULATORY_CARE_PROVIDER_SITE_OTHER): Payer: Self-pay | Admitting: Internal Medicine

## 2016-10-19 ENCOUNTER — Encounter: Payer: Self-pay | Admitting: Vascular Surgery

## 2016-10-19 ENCOUNTER — Encounter: Payer: Self-pay | Admitting: Internal Medicine

## 2016-10-19 VITALS — BP 160/98 | HR 76 | Ht 76.0 in | Wt 300.8 lb

## 2016-10-19 DIAGNOSIS — E782 Mixed hyperlipidemia: Secondary | ICD-10-CM

## 2016-10-19 DIAGNOSIS — I2510 Atherosclerotic heart disease of native coronary artery without angina pectoris: Secondary | ICD-10-CM

## 2016-10-19 DIAGNOSIS — I10 Essential (primary) hypertension: Secondary | ICD-10-CM

## 2016-10-19 NOTE — Progress Notes (Signed)
Cardiology Office Note   Date:  10/19/2016   ID:  William NipperWilliam Young, DOB 10/22/1960, MRN [id number]  PCP:  Patient, No Pcp Per  Cardiologist:   Dietrich PatesPaula Cale Decarolis, MD   F/U of CAD     History of Present Illness: William Young is a 56 y.o. male with a history of  CAD, x/p NSTEMI  May 2016  Cath showed 90% prox RCAand 80% R PLSA.  Underent PCI/BMS to RCA and PCI/DES to PLSA  Hx ASA allergy  Rx Brilinta for 1 yr then Plavix LVEF normal      I saw him in May BP was up  He was off meds  I recomm cozaar 50 an lipitor40   Since seen he denies CP  Breathing OK  He says he ran out of meds several days ago       Current Meds  Medication Sig  . atorvastatin (LIPITOR) 40 MG tablet Take 1 tablet (40 mg total) by mouth daily.  . cephALEXin (KEFLEX) 500 MG capsule Take 1 capsule (500 mg total) by mouth 4 (four) times daily.  Marland Kitchen. losartan (COZAAR) 50 MG tablet Take 1 tablet (50 mg total) by mouth daily.     Allergies:   Aspirin and Ibuprofen   Past Medical History:  Diagnosis Date  . CAD (coronary artery disease) 09/2014   a. NSTEMI >> LHC 5/16:  mLAD 15, pRCA 90, mRCA 40, RPLB3 80, EF normal with inf HK >> PCI:  BMS to pRCA and DES to RPLB3   . Diabetes mellitus without complication (HCC)   . GERD (gastroesophageal reflux disease)   . History of echocardiogram    Echo 6/16:  EF 50-55%, no RWMA, Gr 1 DD  . HTN (hypertension)   . Hyperglycemia   . OSA (obstructive sleep apnea)   . Tobacco use     Past Surgical History:  Procedure Laterality Date  . CARDIAC CATHETERIZATION N/A 09/11/2014   Procedure: Left Heart Cath and Coronary Angiography;  Surgeon: Marykay Lexavid W Harding, MD;  [facility];  Service: Cardiovascular;  Laterality: N/A;  . CARDIAC CATHETERIZATION  09/11/2014   Procedure: Coronary Stent Intervention;  Surgeon: Marykay Lexavid W Harding, MD;  [facility];  Service: Cardiovascular;;  . None       Social History:  The patient  reports that he has  been smoking Cigarettes.  He has a 35.00 pack-year smoking history. He has never used smokeless tobacco. He reports that he drinks alcohol. He reports that he does not use drugs.   Family History:  The patient's family history includes Cancer in his maternal grandfather; Heart attack in his maternal grandmother, maternal uncle, and paternal grandmother.    ROS:  Please see the history of present illness. All other systems are reviewed and  Negative to the above problem except as noted.    PHYSICAL EXAM: VS:  BP (!) 160/98   Pulse 76   Ht 6\' 4"  (1.93 m)   Wt (!) 136.4 kg (300 lb 12.8 oz)   SpO2 96%   BMI 36.61 kg/m   GEN: Well nourished, well developed, in no acute distress  HEENT: normal  Neck: no JVD, carotid bruits, or masses Cardiac: RRR; no murmurs, rubs, or gallops,no edema  Respiratory:  clear to auscultation bilaterally, normal work of breathing GI: soft, nontender, nondistended, + BS  No hepatomegaly  MS: no deformity Moving all extremities   Skin: warm and dry, no rash Neuro:  Strength and sensation are intact Psych: euthymic mood, full affect   EKG:  EKG is not ordered today.   Lipid Panel    Component Value Date/Time   CHOL 219 (H) 09/11/2016 1001   TRIG 105 09/11/2016 1001   HDL 44 09/11/2016 1001   CHOLHDL 5.0 09/11/2016 1001   CHOLHDL 6.1 (H) 11/18/2015 0940   VLDL 33 (H) 11/18/2015 0940   LDLCALC 154 (H) 09/11/2016 1001      Wt Readings from Last 3 Encounters:  10/19/16 (!) 136.4 kg (300 lb 12.8 oz)  09/11/16 131.3 kg (289 lb 6.4 oz)  11/18/15 (!) 137 kg (302 lb)      ASSESSMENT AND PLAN:  1  CAD  No angina  Resume meds   Needs to resume Plavix  2  HTN  BP elevated  Resume meds   3  HL  Resume lipitor   Current medicines are reviewed at length with the patient today.  The patient does not have concerns regarding medicines.  Signed, Dietrich Pates, MD  10/19/2016 4:42 PM    [facility] [address] [place], [place], [place]   [address] Phone: [phone]; Fax: [phone]

## 2016-10-19 NOTE — Patient Instructions (Addendum)
Keep on current meds  Will call in for refill on Cozaar  F/U lipid panel is set for July 9th    F/U with Dr Tenny Crawoss in November 2018

## 2016-10-22 ENCOUNTER — Telehealth: Payer: Self-pay | Admitting: *Deleted

## 2016-10-22 NOTE — Progress Notes (Signed)
He should be on plavix because right now he is on nothing  Allergic to aspirin

## 2016-10-22 NOTE — Telephone Encounter (Signed)
New message      Pt is returning Michalene's call

## 2016-10-22 NOTE — Telephone Encounter (Signed)
Per Dr. Tenny Crawoss patient should be taking Plavix.  Called patient to inform.   Left a message for him to call back.

## 2016-10-22 NOTE — Telephone Encounter (Signed)
Left message for patient to call back  

## 2016-10-26 ENCOUNTER — Encounter (HOSPITAL_COMMUNITY): Payer: Self-pay | Admitting: Emergency Medicine

## 2016-10-26 ENCOUNTER — Emergency Department (HOSPITAL_COMMUNITY): Payer: Self-pay

## 2016-10-26 ENCOUNTER — Emergency Department (HOSPITAL_COMMUNITY)
Admission: EM | Admit: 2016-10-26 | Discharge: 2016-10-26 | Disposition: A | Discharge status: Home / Self Care | Payer: Self-pay | Attending: Emergency Medicine | Admitting: Emergency Medicine

## 2016-10-26 DIAGNOSIS — F17210 Nicotine dependence, cigarettes, uncomplicated: Secondary | ICD-10-CM | POA: Insufficient documentation

## 2016-10-26 DIAGNOSIS — R0789 Other chest pain: Secondary | ICD-10-CM | POA: Insufficient documentation

## 2016-10-26 DIAGNOSIS — I2510 Atherosclerotic heart disease of native coronary artery without angina pectoris: Secondary | ICD-10-CM | POA: Insufficient documentation

## 2016-10-26 DIAGNOSIS — Y999 Unspecified external cause status: Secondary | ICD-10-CM | POA: Insufficient documentation

## 2016-10-26 DIAGNOSIS — Y9361 Activity, american tackle football: Secondary | ICD-10-CM | POA: Insufficient documentation

## 2016-10-26 DIAGNOSIS — Y9289 Other specified places as the place of occurrence of the external cause: Secondary | ICD-10-CM | POA: Insufficient documentation

## 2016-10-26 DIAGNOSIS — I10 Essential (primary) hypertension: Secondary | ICD-10-CM | POA: Insufficient documentation

## 2016-10-26 DIAGNOSIS — W01198A Fall on same level from slipping, tripping and stumbling with subsequent striking against other object, initial encounter: Secondary | ICD-10-CM | POA: Insufficient documentation

## 2016-10-26 DIAGNOSIS — E119 Type 2 diabetes mellitus without complications: Secondary | ICD-10-CM | POA: Insufficient documentation

## 2016-10-26 LAB — POCT I-STAT TROPONIN I: Troponin i, poc: 0 ng/mL (ref 0.00–0.08)

## 2016-10-26 LAB — CBC
HEMATOCRIT: 39.4 % (ref 39.0–52.0)
Hemoglobin: 13.1 g/dL (ref 13.0–17.0)
MCH: 31.2 pg (ref 26.0–34.0)
MCHC: 33.2 g/dL (ref 30.0–36.0)
MCV: 93.8 fL (ref 78.0–100.0)
Platelets: 162 10*3/uL (ref 150–400)
RBC: 4.2 MIL/uL — ABNORMAL LOW (ref 4.22–5.81)
RDW: 13.6 % (ref 11.5–15.5)
WBC: 8.4 10*3/uL (ref 4.0–10.5)

## 2016-10-26 LAB — BASIC METABOLIC PANEL
Anion gap: 6 (ref 5–15)
BUN: 18 mg/dL (ref 6–20)
CO2: 26 mmol/L (ref 22–32)
Calcium: 9.1 mg/dL (ref 8.9–10.3)
Chloride: 108 mmol/L (ref 101–111)
Creatinine, Ser: 0.93 mg/dL (ref 0.61–1.24)
GFR calc Af Amer: >60 mL/min (ref >60)
GLUCOSE: 159 mg/dL — AB (ref 65–99)
POTASSIUM: 4.3 mmol/L (ref 3.5–5.1)
Sodium: 140 mmol/L (ref 135–145)

## 2016-10-26 LAB — I-STAT BETA HCG BLOOD, ED (NOT ORDERABLE): I-stat hCG, quantitative: <5 m[IU]/mL (ref <5)

## 2016-10-26 MED ORDER — LIDOCAINE 5 % EX PTCH: 1.0000 | MEDICATED_PATCH | CUTANEOUS | 0 refills | Status: DC

## 2016-10-26 MED ORDER — DOCUSATE SODIUM 100 MG PO CAPS: 100.0000 mg | ORAL_CAPSULE | Freq: Two times a day (BID) | ORAL | 0 refills | Status: DC

## 2016-10-26 MED ORDER — OXYCODONE-ACETAMINOPHEN 5-325 MG PO TABS
1.0000 | ORAL_TABLET | Freq: Once | ORAL | Status: AC
  Administered 2016-10-26: 1 via ORAL
  Filled 2016-10-26: qty 1

## 2016-10-26 MED ORDER — OXYCODONE-ACETAMINOPHEN 5-325 MG PO TABS: 1.0000 | ORAL_TABLET | Freq: Four times a day (QID) | ORAL | 0 refills | Status: DC | PRN

## 2016-10-26 NOTE — ED Provider Notes (Signed)
Emergency Department Provider Note   I have reviewed the triage vital signs and the nursing notes.   HISTORY  Chief Complaint Chest Pain   HPI William Young is a 56 y.o. male with PMH of CAD, DM, GERD, HTN, and OSA presents to the emergency department for evaluation of left sided chest wall pain after a fall. She was at a [employer] picnic where he was playing football. Patient states that he was running out in a "pattern" and fell to the ground striking his left chest. No head injury or loss of consciousness. No neck pain. Patient has pain worse with deep breathing. No fevers or chills. He has not taken any medication since the fall. No radiation.    Past Medical History:  Diagnosis Date  . CAD (coronary artery disease) 09/2014   a. NSTEMI >> LHC 5/16:  mLAD 15, pRCA 90, mRCA 40, RPLB3 80, EF normal with inf HK >> PCI:  BMS to pRCA and DES to RPLB3   . Diabetes mellitus without complication (HCC)   . GERD (gastroesophageal reflux disease)   . History of echocardiogram    Echo 6/16:  EF 50-55%, no RWMA, Gr 1 DD  . HTN (hypertension)   . Hyperglycemia   . OSA (obstructive sleep apnea)   . Tobacco use     Patient Active Problem List   Diagnosis Date Noted  . HLD (hyperlipidemia) 09/28/2014  . Diabetes mellitus (HCC) 09/14/2014  . Obstructive sleep apnea 09/14/2014  . Morbid obesity (HCC) 09/12/2014  . Coronary artery disease   . NSTEMI, initial episode of care (HCC) 09/10/2014  . Essential hypertension 09/10/2014  . Hyperglycemia   . Tobacco use   . GERD (gastroesophageal reflux disease)     Past Surgical History:  Procedure Laterality Date  . CARDIAC CATHETERIZATION N/A 09/11/2014   Procedure: Left Heart Cath and Coronary Angiography;  Surgeon: Marykay Lexavid W Harding, MD;  [facility];  Service: Cardiovascular;  Laterality: N/A;  . CARDIAC CATHETERIZATION  09/11/2014   Procedure: Coronary Stent Intervention;  Surgeon: Marykay Lexavid W Harding, MD;  [facility];  Service: Cardiovascular;;  . None      Current Outpatient Rx  . Order #: [id number] Class: Normal  . Order #: [id number] Class: Normal  . Order #: [id number] Class: Print  . Order #: [id number] Class: Print  . Order #: [id number] Class: Print  . Order #: [id number] Class: Print    Allergies Aspirin and Ibuprofen  Family History  Problem Relation Age of Onset  . Heart attack Maternal Uncle   . Heart attack Paternal Grandmother   . Cancer Maternal Grandfather   . Heart attack Maternal Grandmother   . Stroke Neg Hx   . Hypertension Neg Hx     Social History Social History  Substance Use Topics  . Smoking status: Current Every Day Smoker    Packs/day: 1.00    Years: 35.00    Types: Cigarettes    Last attempt to quit: 09/10/2014  . Smokeless tobacco: Never Used  . Alcohol use 0.0 oz/week     Comment: occassional    Review of Systems  Constitutional: No fever/chills Eyes: No visual changes. ENT: No sore throat. Cardiovascular: Positive chest pain. Respiratory: Denies shortness of breath. Gastrointestinal: No abdominal pain.  No nausea, no vomiting.  No diarrhea.  No constipation. Genitourinary: Negative for dysuria. Musculoskeletal: Negative for back pain. Skin: Negative for rash. Neurological: Negative for headaches, focal weakness or numbness.  10-point ROS otherwise negative.  ____________________________________________   PHYSICAL EXAM:  VITAL SIGNS: ED Triage Vitals [10/26/16 1834]  Enc Vitals Group     BP (!) 150/80     Pulse Rate 79     Resp 20     Temp 97.7 F (36.5 C)     Temp Source Oral     SpO2 96 %     Pain Score 10    Constitutional: Alert and oriented. Well appearing and in no acute distress. Eyes: Conjunctivae are normal.  Head: Atraumatic. Nose: No congestion/rhinnorhea. Mouth/Throat: Mucous membranes are moist.  Oropharynx non-erythematous. Neck: No stridor.  No cervical spine tenderness to palpation. Cardiovascular:  Normal rate, regular rhythm. Good peripheral circulation. Grossly normal heart sounds.   Respiratory: Normal respiratory effort.  No retractions. Lungs CTAB. Gastrointestinal: Soft and nontender. No distention.  Musculoskeletal: No lower extremity tenderness nor edema. No gross deformities of extremities. Positive left chest wall tenderness to palpation. No paradoxical movement. No bruising.  Neurologic:  Normal speech and language. No gross focal neurologic deficits are appreciated.  Skin:  Skin is warm, dry and intact. No rash noted.  ____________________________________________   LABS (all labs ordered are listed, but only abnormal results are displayed)  Labs Reviewed  BASIC METABOLIC PANEL - Abnormal; Notable for the following:       Result Value   Glucose, Bld 159 (*)    All other components within normal limits  CBC - Abnormal; Notable for the following:    RBC 4.20 (*)    All other components within normal limits  I-STAT TROPOININ, ED  POCT I-STAT TROPONIN I  I-STAT BETA HCG BLOOD, ED (NOT ORDERABLE)   ____________________________________________  EKG   EKG Interpretation  Date/Time:  Monday October 26 2016 18:33:22 EDT Ventricular Rate:  83 PR Interval:    QRS Duration: 103 QT Interval:  377 QTC Calculation: 443 R Axis:   -50 Text Interpretation:  Sinus rhythm Left anterior fascicular block Abnormal R-wave progression, early transition No STEMI.  Confirmed by Alona Bene [phone]) on 10/26/2016 7:19:08 PM Also confirmed by Alona Bene [phone]), editor Elita Quick [phone])  on 10/27/2016 7:19:39 AM       ____________________________________________  RADIOLOGY  Dg Chest 2 View  Result Date: 10/26/2016 CLINICAL DATA:  Chest pain following fall EXAM: CHEST  2 VIEW COMPARISON:  May 15, 2015 FINDINGS: Lungs are clear. Heart size and pulmonary vascularity are normal. No adenopathy. No pneumothorax. No bone lesions demonstrable. IMPRESSION: No edema or  consolidation.  No evident pneumothorax. Electronically Signed   By: Bretta Bang III M.D.   On: 10/26/2016 20:17   Dg Ribs Unilateral W/chest Left  Result Date: 10/26/2016 CLINICAL DATA:  Injury while playing football EXAM: LEFT RIBS AND CHEST - 3+ VIEW COMPARISON:  Chest radiograph May 15, 2015 FINDINGS: Old chest as well as oblique and cone-down lower rib images obtained. Lungs clear. Heart size and pulmonary vascularity are normal. No adenopathy. No evident pneumothorax or pleural effusion. No demonstrable rib fracture. IMPRESSION: No evident fracture.  Lungs clear.  No pneumothorax. Electronically Signed   By: Bretta Bang III M.D.   On: 10/26/2016 20:18    ____________________________________________   PROCEDURES  Procedure(s) performed:   Procedures  None ____________________________________________   INITIAL IMPRESSION / ASSESSMENT AND PLAN / ED COURSE  Pertinent labs & imaging results that were available during my care of the patient were reviewed by me and considered in my medical decision making (see chart for details).  Patient presents to the emergency department  for evaluation of left chest wall pain after mechanical fall. He has no deformity or bruising to the left chest wall. He has equal breath sounds and normal oxygen saturation. Lower suspicion for pneumothorax. Suspect the etiology of his chest pain is musculoskeletal. He was diaphoretic and uncomfortable in triage and so cardiac labs were obtained along with EKG which are normal. Plan for chest x-ray with left rib series and reassessment. Patient given Percocet for pain control in the emergency department.  Plain films negative for obvious fracture. Will treat with pain medication, lidoderm patch, and incentive spirometer. Plan for PCP follow up. Clinically the patient has rib fractures so will treat accordingly.   At this time, I do not feel there is any life-threatening condition present. I have  reviewed and discussed all results (EKG, imaging, lab, urine as appropriate), exam findings with patient. I have reviewed nursing notes and appropriate previous records.  I feel the patient is safe to be discharged home without further emergent workup. Discussed usual and customary return precautions. Patient and family (if present) verbalize understanding and are comfortable with this plan.  Patient will follow-up with their primary care provider. If they do not have a primary care provider, information for follow-up has been provided to them. All questions have been answered.  ____________________________________________  FINAL CLINICAL IMPRESSION(S) / ED DIAGNOSES  Final diagnoses:  Chest wall pain     MEDICATIONS GIVEN DURING THIS VISIT:  Medications  oxyCODONE-acetaminophen (PERCOCET/ROXICET) 5-325 MG per tablet 1 tablet (1 tablet Oral Given 10/26/16 1947)     NEW OUTPATIENT MEDICATIONS STARTED DURING THIS VISIT:  Discharge Medication List as of 10/26/2016 10:22 PM    START taking these medications   Details  docusate sodium (COLACE) 100 MG capsule Take 1 capsule (100 mg total) by mouth every 12 (twelve) hours., Starting Mon 10/26/2016, Print    lidocaine (LIDODERM) 5 % Place 1 patch onto the skin daily. Remove & Discard patch within 12 hours or as directed by MD, Starting Mon 10/26/2016, Print    oxyCODONE-acetaminophen (PERCOCET/ROXICET) 5-325 MG tablet Take 1 tablet by mouth every 6 (six) hours as needed for severe pain., Starting Mon 10/26/2016, Print         Note:  This document was prepared using Dragon voice recognition software and may include unintentional dictation errors.  Alona Bene, MD Emergency Medicine   Long, Arlyss Repress, MD 10/27/16 [phone]

## 2016-10-26 NOTE — Discharge Instructions (Signed)

## 2016-10-26 NOTE — ED Notes (Signed)
Patient transported to X-ray 

## 2016-10-26 NOTE — ED Notes (Signed)
Pt from home with c/o left sided chest pain. Pt states he was hit by a football at a company picnic today. Pt states this occurred 1730 today. Pt endorses lightheadedness. Pt states he used to have nitro to take for CP, but he ran out of it. Pt is diaphoretic at time of assessment. Pt has PMH of previous MI

## 2016-10-27 ENCOUNTER — Ambulatory Visit (INDEPENDENT_AMBULATORY_CARE_PROVIDER_SITE_OTHER): Payer: Self-pay | Admitting: Vascular Surgery

## 2016-10-27 ENCOUNTER — Encounter: Payer: Self-pay | Admitting: Vascular Surgery

## 2016-10-27 VITALS — BP 135/86 | HR 72 | Temp 97.1°F | Resp 18 | Ht 76.0 in | Wt 300.0 lb

## 2016-10-27 DIAGNOSIS — I83893 Varicose veins of bilateral lower extremities with other complications: Secondary | ICD-10-CM

## 2016-10-27 DIAGNOSIS — I83891 Varicose veins of right lower extremities with other complications: Secondary | ICD-10-CM | POA: Insufficient documentation

## 2016-10-27 NOTE — Progress Notes (Signed)
Subjective:     Patient ID: William Young, male   DOB: January 30, 1961, 56 y.o.   MRN: [id number]  HPI This 56 year old male was referred by Dr. Harrington Challenger for bilateral painful varicose veins with swelling. She has a history of coronary artery disease with previous PTCA and stenting and is doing well from that standpoint although he does continue to smoke 1 pack of cigarettes per day. He denies claudication symptoms. He does have chronic swelling in both ankles and is noted skin changes in the lower third both legs. He has had large bulges particular in the right leg for many years. These do develop heaviness as the day progresses. 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 He does not elastic compression stockings.  Past Medical History:  Diagnosis Date  . CAD (coronary artery disease) 09/2014   a. NSTEMI >> LHC 5/16:  mLAD 15, pRCA 90, mRCA 40, RPLB3 80, EF normal with inf HK >> PCI:  BMS to pRCA and DES to RPLB3   . Diabetes mellitus without complication ([place])   . GERD (gastroesophageal reflux disease)   . History of echocardiogram    Echo 6/16:  EF 50-55%, no RWMA, Gr 1 DD  . HTN (hypertension)   . Hyperglycemia   . OSA (obstructive sleep apnea)   . Tobacco use   . Varicose veins of both lower extremities     Social History  Substance Use Topics  . Smoking status: Current Every Day Smoker    Packs/day: 1.00    Years: 35.00    Types: Cigarettes    Last attempt to quit: 09/10/2014  . Smokeless tobacco: Never Used  . Alcohol use 0.0 oz/week     Comment: occassional    Family History  Problem Relation Age of Onset  . Heart attack Maternal Uncle   . Heart attack Paternal Grandmother   . Cancer Maternal Grandfather   . Heart attack Maternal Grandmother   . Stroke Neg Hx   . Hypertension Neg Hx     Allergies  Allergen Reactions  . Aspirin Anaphylaxis, Swelling and Rash    Throat closes   . Ibuprofen Anaphylaxis, Swelling and Rash     Current  Outpatient Prescriptions:  .  atorvastatin (LIPITOR) 40 MG tablet, Take 1 tablet (40 mg total) by mouth daily., Disp: 90 tablet, Rfl: 3 .  losartan (COZAAR) 50 MG tablet, Take 1 tablet (50 mg total) by mouth daily., Disp: 90 tablet, Rfl: 3 .  oxyCODONE-acetaminophen (PERCOCET/ROXICET) 5-325 MG tablet, Take 1 tablet by mouth every 6 (six) hours as needed for severe pain., Disp: 12 tablet, Rfl: 0 .  cephALEXin (KEFLEX) 500 MG capsule, Take 1 capsule (500 mg total) by mouth 4 (four) times daily. (Patient not taking: Reported on 10/26/2016), Disp: 20 capsule, Rfl: 0 .  docusate sodium (COLACE) 100 MG capsule, Take 1 capsule (100 mg total) by mouth every 12 (twelve) hours. (Patient not taking: Reported on 10/27/2016), Disp: 60 capsule, Rfl: 0 .  lidocaine (LIDODERM) 5 %, Place 1 patch onto the skin daily. Remove & Discard patch within 12 hours or as directed by MD (Patient not taking: Reported on 10/27/2016), Disp: 30 patch, Rfl: 0  Vitals:   10/27/16 1520  BP: 135/86  Pulse: 72  Resp: 18  Temp: 97.1 F (36.2 C)  TempSrc: Oral  SpO2: 96%  Weight: 300 lb (136.1 kg)  Height: '6\' 4"'  (1.93 m)    Body mass index is  36.52 kg/m.         Review of Systems Denies chest pain but does have mild dyspnea on exertion. Denies claudication. Fractured left rib yesterday while playing football at his work picnic--went to emergency department for x-rays. Has high blood pressure, diabetes mellitus-type II, and above-mentioned coronary artery disease. Other systems negative and complete review of systems    Objective:   Physical Exam BP 135/86 (BP Location: Left Arm, Patient Position: Sitting, Cuff Size: Large)   Pulse 72   Temp 97.1 F (36.2 C) (Oral)   Resp 18   Ht '6\' 4"'  (1.93 m)   Wt 300 lb (136.1 kg)   SpO2 96%   BMI 36.52 kg/m     Gen.-alert and oriented x3 in no apparent distress HEENT normal for age Lungs no rhonchi or wheezing Cardiovascular regular rhythm no murmurs carotid pulses 3+  palpable no bruits audible Abdomen soft nontender no palpable masses Musculoskeletal free of  major deformities Skin clear -severe hyperpigmentation lower third both legs right greater than left with no active ulceration with dermatolipo  sclerosis Neurologic normal Lower extremities 3+ femoral and dorsalis pedis pulses palpable bilaterally with 1+ edema bilaterally Large bulging varicosities right worse than left and medial and distal thigh and medial calf extending down the medial malleolus where hyperpigmentation is severe. Left leg with medial varicosities in the thigh area and into the medial calf area with severe hyperpigmentation.  Today I performed a bedside SonoSite ultrasound exam which reveals enlarged great saphenous veins with reflux bilaterally supplying these painful varicosities       Assessment:     #1 bilateral painful varicosities right greater than left due to gross reflux bilateral great saphenous veins causing swelling and severe skin changes with no active ulceration CEAP4 #2 coronary artery disease previous PTC a and stenting 2 years ago #3 ongoing tobacco abuse #4 diabetes mellitus type 2 #5 hypertension #6 traumatic left rib fracture occurred yesterday    Plan:         #1 long leg elastic compression stockings 20-30 mm gradient #2 elevate legs as much as possible #3 ibuprofen daily on a regular basis for pain #4 return in 3 months-he will have formal venous reflux exam performed in 3 months and we will make formal recommendation Patient does have coronary artery disease will need to consider thiswhen  making recommendation

## 2016-10-28 NOTE — Telephone Encounter (Signed)
Left detailed message for patient to call back regarding Plavix.

## 2016-11-03 NOTE — Addendum Note (Signed)
Addended by: Burton ApleyPETTY, Aiyanna Awtrey A on: 11/03/2016 12:08 PM   Modules accepted: Orders

## 2016-11-16 ENCOUNTER — Other Ambulatory Visit: Payer: Self-pay

## 2016-11-17 MED ORDER — CLOPIDOGREL BISULFATE 75 MG PO TABS: 75.0000 mg | ORAL_TABLET | Freq: Every day | ORAL | 3 refills | Status: DC

## 2016-11-17 NOTE — Telephone Encounter (Signed)
I left another detailed message for patient.  I asked him to pick up Plavix 75 mg tablet and to start taking this medication once a day for his stents.  I also asked that he call back and leave a message that he heard my message and that he will start the medication.

## 2017-02-01 ENCOUNTER — Ambulatory Visit: Payer: Self-pay | Admitting: Vascular Surgery

## 2017-02-01 ENCOUNTER — Encounter (HOSPITAL_COMMUNITY): Payer: Self-pay

## 2017-02-09 ENCOUNTER — Ambulatory Visit (HOSPITAL_COMMUNITY)
Admission: RE | Admit: 2017-02-09 | Discharge: 2017-02-09 | Disposition: A | Discharge status: Home / Self Care | Payer: Self-pay | Source: Ambulatory Visit | Attending: Vascular Surgery | Admitting: Vascular Surgery

## 2017-02-09 ENCOUNTER — Encounter: Payer: Self-pay | Admitting: Vascular Surgery

## 2017-02-09 ENCOUNTER — Ambulatory Visit (INDEPENDENT_AMBULATORY_CARE_PROVIDER_SITE_OTHER): Payer: Self-pay | Admitting: Vascular Surgery

## 2017-02-09 VITALS — BP 153/86 | HR 66 | Temp 97.7°F | Resp 18 | Ht 75.0 in | Wt 289.8 lb

## 2017-02-09 DIAGNOSIS — I83893 Varicose veins of bilateral lower extremities with other complications: Secondary | ICD-10-CM | POA: Insufficient documentation

## 2017-02-09 NOTE — Progress Notes (Signed)
Subjective:     Patient ID: William Young, male   DOB: November 15, 1960, 56 y.o.   MRN: [id number]  HPI This 56 year old male returns for 3 month follow-up regarding his painful varicosities in both legs with severe skin changes and swelling area his right leg is worse in the left. 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 He is tried long-leg elastic compression stockings 20-30 millimeter gradient as well as elevation when he is able to and this has not improved his situation. He would like treatment. He does have history of coronary artery disease with PTCA and stenting in 2016 for a non-STEMI. He is asymptomatic now.  Past Medical History:  Diagnosis Date  . CAD (coronary artery disease) 09/2014   a. NSTEMI >> LHC 5/16:  mLAD 15, pRCA 90, mRCA 40, RPLB3 80, EF normal with inf HK >> PCI:  BMS to pRCA and DES to RPLB3   . Diabetes mellitus without complication (HCC)   . GERD (gastroesophageal reflux disease)   . History of echocardiogram    Echo 6/16:  EF 50-55%, no RWMA, Gr 1 DD  . HTN (hypertension)   . Hyperglycemia   . OSA (obstructive sleep apnea)   . Tobacco use   . Varicose veins of both lower extremities     Social History  Substance Use Topics  . Smoking status: Current Every Day Smoker    Packs/day: 1.00    Years: 35.00    Types: Cigarettes    Last attempt to quit: 09/10/2014  . Smokeless tobacco: Never Used  . Alcohol use 0.0 oz/week     Comment: occassional    Family History  Problem Relation Age of Onset  . Heart attack Maternal Uncle   . Heart attack Paternal Grandmother   . Cancer Maternal Grandfather   . Heart attack Maternal Grandmother   . Stroke Neg Hx   . Hypertension Neg Hx     Allergies  Allergen Reactions  . Aspirin Anaphylaxis, Swelling and Rash    Throat closes   . Ibuprofen Anaphylaxis, Swelling and Rash     Current Outpatient Prescriptions:  .  clopidogrel (PLAVIX) 75 MG tablet, Take 1 tablet (75 mg  total) by mouth daily., Disp: 90 tablet, Rfl: 3 .  atorvastatin (LIPITOR) 40 MG tablet, Take 1 tablet (40 mg total) by mouth daily., Disp: 90 tablet, Rfl: 3 .  cephALEXin (KEFLEX) 500 MG capsule, Take 1 capsule (500 mg total) by mouth 4 (four) times daily. (Patient not taking: Reported on 10/26/2016), Disp: 20 capsule, Rfl: 0 .  docusate sodium (COLACE) 100 MG capsule, Take 1 capsule (100 mg total) by mouth every 12 (twelve) hours. (Patient not taking: Reported on 10/27/2016), Disp: 60 capsule, Rfl: 0 .  lidocaine (LIDODERM) 5 %, Place 1 patch onto the skin daily. Remove & Discard patch within 12 hours or as directed by MD (Patient not taking: Reported on 10/27/2016), Disp: 30 patch, Rfl: 0 .  losartan (COZAAR) 50 MG tablet, Take 1 tablet (50 mg total) by mouth daily., Disp: 90 tablet, Rfl: 3 .  oxyCODONE-acetaminophen (PERCOCET/ROXICET) 5-325 MG tablet, Take 1 tablet by mouth every 6 (six) hours as needed for severe pain. (Patient not taking: Reported on 02/09/2017), Disp: 12 tablet, Rfl: 0  Vitals:   02/09/17 1151 02/09/17 1153  BP: (!) 155/98 (!) 153/86  Pulse: 66   Resp: 18   Temp: 97.7 F (36.5 C)   TempSrc: Oral   SpO2:  99%   Weight: 289 lb 12.8 oz (131.5 kg)   Height:  (1.905 m)     Body mass index is 36.22 kg/m.         Review of Systems Denies chest pain, dyspnea on exertion, PND, orthopnea, hemoptysis-see history of present illness    Objective:   Physical Exam BP (!) 153/86 (BP Location: Left Arm, Patient Position: Sitting, Cuff Size: Large)   Pulse 66   Temp 97.7 F (36.5 C) (Oral)   Resp 18   Ht  (1.905 m)   Wt 289 lb 12.8 oz (131.5 kg)   SpO2 99%   BMI 36.22 kg/m   Gen. well-developed obese male no apparent distress alert and oriented 3 Lungs no rhonchi or wheezing Right leg with extensive bulging varicosities beginning in the mid thigh medially extending down to the medial malleolus with smaller varicosities in the distal lateral thigh. Severe  hyperpigmentation but no active ulcer. 3+ dorsalis pedis pulse palpable. Left leg has lesser degree of varicosities but a few are located in the medial thigh and calf with significant hyperpigmentation lower third but no ulcer.  Today I ordered bilateral venous reflux exam which I reviewed and interpreted. Also performed a bedside SonoSite ultrasound exam. The right great saphenous vein is large with significant reflux supplying these painful varicosities The left great saphenous vein is also large supplying painful varicosities with reflux     Assessment:     Painful varicosities with edema and severe skin changes-bilaterally due to gross reflux bilateral great saphenous veins --CEAP4 history of coronary artery disease with PTCA and stenting in 2016    Plan:         patient needs laser ablation right great saphenous vein plus greater than 20 stab phlebectomy of painful varicosities for severe reflux with pain and skin changes We will hold off on left leg because of history of coronary artery disease with previous intervention and ongoing tobacco abuse If skin changes become more severe on the left or ulceration develops he will need laser ablation left great saphenous system as well We will proceed in the near future with the right leg

## 2017-02-10 ENCOUNTER — Other Ambulatory Visit: Payer: Self-pay | Admitting: *Deleted

## 2017-02-10 DIAGNOSIS — I83893 Varicose veins of bilateral lower extremities with other complications: Secondary | ICD-10-CM

## 2017-02-22 ENCOUNTER — Encounter: Payer: Self-pay | Admitting: Vascular Surgery

## 2017-02-22 ENCOUNTER — Ambulatory Visit (INDEPENDENT_AMBULATORY_CARE_PROVIDER_SITE_OTHER): Payer: Self-pay | Admitting: Vascular Surgery

## 2017-02-22 VITALS — BP 162/91 | HR 74 | Temp 97.2°F | Resp 16 | Ht 75.0 in | Wt 288.0 lb

## 2017-02-22 DIAGNOSIS — I83891 Varicose veins of right lower extremities with other complications: Secondary | ICD-10-CM

## 2017-02-22 HISTORY — PX: ENDOVENOUS ABLATION SAPHENOUS VEIN W/ LASER: SUR449

## 2017-02-22 NOTE — Progress Notes (Signed)
Subjective:     Patient ID: Bryson Gavia, male   DOB: 11-Jul-1960, 56 y.o.   MRN: [id number]  HPI This 56 year old male had laser ablation right great saphenous vein from the proximal calf to near the saphenofemoral junction plus approximately 40 stab phlebectomy of painful varicosities performed under local tumescent anesthesia. A total of 2725 J of energy was utilized. He tolerated the procedures well.  Review of Systems     Objective:   Physical Exam BP (!) 162/91 (BP Location: Left Arm, Patient Position: Sitting, Cuff Size: Normal)   Pulse 74   Temp (!) 97.2 F (36.2 C) (Oral)   Resp 16   Ht  (1.905 m)   Wt 288 lb (130.6 kg)   SpO2 98%   BMI 36.00 kg/m        Assessment:     Well-tolerated laser ablation right great saphenous vein plus greater than 20 stab phlebectomy of painful varicosities performed under local tumescent anesthesia    Plan:     Return in 1 week for venous duplex exam to confirm closure right great saphenous vein

## 2017-02-22 NOTE — Progress Notes (Signed)
Laser Ablation Procedure    Date: 02/22/2017   William Young DOB:1961-03-25  Consent signed: Yes    Surgeon:  Dr. Quita Skye. Hart [place]  Procedure: Laser Ablation: right Greater Saphenous Vein  BP (!) 162/91 (BP Location: Left Arm, Patient Position: Sitting, Cuff Size: Normal)   Pulse 74   Temp (!) 97.2 F (36.2 C) (Oral)   Resp 16   Ht  (1.905 m)   Wt 288 lb (130.6 kg)   SpO2 98%   BMI 36.00 kg/m   Tumescent Anesthesia: 500 cc 0.9% NaCl with 50 cc Lidocaine HCL with 1% Epi and 15 cc 8.4% NaHCO3  Local Anesthesia: 13 cc Lidocaine HCL and NaHCO3 (ratio 2:1)  Pulsed Mode: 15 watts, delay, 1.0 duration  Total Energy: 2725 Joules             Total Pulses:    183            Total Time: 3:02    Stab Phlebectomy: >40 Sites: Thigh and Calf  Patient tolerated procedure well    Description of Procedure:  After marking the course of the secondary varicosities, the patient was placed on the operating table in the supine position, and the right leg was prepped and draped in sterile fashion.   Local anesthetic was administered and under ultrasound guidance the saphenous vein was accessed with a micro needle and guide wire; then the mirco puncture sheath was placed.  A guide wire was inserted saphenofemoral junction , followed by a 5 french sheath.  The position of the sheath and then the laser fiber below the junction was confirmed using the ultrasound.  Tumescent anesthesia was administered along the course of the saphenous vein using ultrasound guidance. The patient was placed in Trendelenburg position and protective laser glasses were placed on patient and staff, and the laser was fired at 15 watts continuous mode advancing 1-5mm/second for a total of 2725 joules.   For stab phlebectomies, local anesthetic was administered at the previously marked varicosities, and tumescent anesthesia was administered around the vessels.  Greater than 20 stab wounds were made using the tip of an 11  blade. And using the vein hook, the phlebectomies were performed using a hemostat to avulse the varicosities.  Adequate hemostasis was achieved.     Steri strips were applied to the stab wounds and ABD pads and thigh high compression stockings were applied.  Ace wrap bandages were applied over the phlebectomy sites and at the top of the saphenofemoral junction. Blood loss was less than 15 cc.  The patient ambulated out of the operating room having tolerated the procedure well.

## 2017-03-01 ENCOUNTER — Ambulatory Visit (HOSPITAL_COMMUNITY)
Admission: RE | Admit: 2017-03-01 | Discharge: 2017-03-01 | Disposition: A | Discharge status: Home / Self Care | Payer: Self-pay | Source: Ambulatory Visit | Attending: Vascular Surgery | Admitting: Vascular Surgery

## 2017-03-01 ENCOUNTER — Ambulatory Visit (INDEPENDENT_AMBULATORY_CARE_PROVIDER_SITE_OTHER): Payer: Self-pay | Admitting: Vascular Surgery

## 2017-03-01 ENCOUNTER — Encounter: Payer: Self-pay | Admitting: Vascular Surgery

## 2017-03-01 VITALS — BP 156/79 | HR 89 | Temp 98.4°F | Resp 18 | Ht 75.0 in | Wt 288.0 lb

## 2017-03-01 DIAGNOSIS — I83891 Varicose veins of right lower extremities with other complications: Secondary | ICD-10-CM

## 2017-03-01 DIAGNOSIS — I83893 Varicose veins of bilateral lower extremities with other complications: Secondary | ICD-10-CM | POA: Insufficient documentation

## 2017-03-01 NOTE — Progress Notes (Signed)
Subjective:     Patient ID: William NipperWilliam Young, male   DOB: 07/31/1960, 56 y.o.   MRN: [id number]  HPI This 56 year old male returns 1 week post-laser ablation right great saphenous vein plus multiple stab phlebectomy of painful varicosities performed under local tumescent anesthesia last week. He has no complaints. He said some mild discomfort in the medial thigh and some bruising which is resolving. He has noticed a significant difference in the swelling and heaviness of his lower leg and ankle and foot area. He has no specific complaints.  Past Medical History:  Diagnosis Date  . CAD (coronary artery disease) 09/2014   a. NSTEMI >> LHC 5/16:  mLAD 15, pRCA 90, mRCA 40, RPLB3 80, EF normal with inf HK >> PCI:  BMS to pRCA and DES to RPLB3   . Diabetes mellitus without complication (HCC)   . GERD (gastroesophageal reflux disease)   . History of echocardiogram    Echo 6/16:  EF 50-55%, no RWMA, Gr 1 DD  . HTN (hypertension)   . Hyperglycemia   . OSA (obstructive sleep apnea)   . Tobacco use   . Varicose veins of both lower extremities     Social History  Substance Use Topics  . Smoking status: Current Every Day Smoker    Packs/day: 1.00    Years: 35.00    Types: Cigarettes    Last attempt to quit: 09/10/2014  . Smokeless tobacco: Never Used  . Alcohol use 0.0 oz/week     Comment: occassional    Family History  Problem Relation Age of Onset  . Heart attack Maternal Uncle   . Heart attack Paternal Grandmother   . Cancer Maternal Grandfather   . Heart attack Maternal Grandmother   . Stroke Neg Hx   . Hypertension Neg Hx     Allergies  Allergen Reactions  . Aspirin Anaphylaxis, Swelling and Rash    Throat closes   . Ibuprofen Anaphylaxis, Swelling and Rash     Current Outpatient Prescriptions:  .  cephALEXin (KEFLEX) 500 MG capsule, Take 1 capsule (500 mg total) by mouth 4 (four) times daily., Disp: 20 capsule, Rfl: 0 .  clopidogrel (PLAVIX) 75 MG tablet, Take 1 tablet (75 mg  total) by mouth daily., Disp: 90 tablet, Rfl: 3 .  atorvastatin (LIPITOR) 40 MG tablet, Take 1 tablet (40 mg total) by mouth daily., Disp: 90 tablet, Rfl: 3 .  docusate sodium (COLACE) 100 MG capsule, Take 1 capsule (100 mg total) by mouth every 12 (twelve) hours. (Patient not taking: Reported on 03/01/2017), Disp: 60 capsule, Rfl: 0 .  lidocaine (LIDODERM) 5 %, Place 1 patch onto the skin daily. Remove & Discard patch within 12 hours or as directed by MD (Patient not taking: Reported on 10/27/2016), Disp: 30 patch, Rfl: 0 .  losartan (COZAAR) 50 MG tablet, Take 1 tablet (50 mg total) by mouth daily., Disp: 90 tablet, Rfl: 3 .  oxyCODONE-acetaminophen (PERCOCET/ROXICET) 5-325 MG tablet, Take 1 tablet by mouth every 6 (six) hours as needed for severe pain. (Patient not taking: Reported on 02/09/2017), Disp: 12 tablet, Rfl: 0  Vitals:   03/01/17 1431  BP: (!) 156/79  Pulse: 89  Resp: 18  Temp: 98.4 F (36.9 C)  SpO2: 96%  Weight: 288 lb (130.6 kg)  Height: 6\' 3"  (1.905 m)    Body mass index is 36 kg/m.         Review of Systems     Objective:   Physical Exam BP (!) 156/79 (  BP Location: Left Arm, Patient Position: Sitting, Cuff Size: Large)   Pulse 89   Temp 98.4 F (36.9 C)   Resp 18   Ht 6\' 3"  (1.905 m)   Wt 288 lb (130.6 kg)   SpO2 96%   BMI 36.00 kg/m   Gen. obese male no apparent distress alert and oriented 3 Right leg with nicely healed stab phlebectomy sites in the medial lateral calf areas. Trace distal edema. Moderate bruising in the medial thigh over the great saphenous vein with mild tenderness to deep palpation. 2+ dorsalis pedis pulse palpable.  Today or venous duplex exam the right leg which I reviewed and interpreted. There is no DVT. There is total closure of the right great saphenous vein up to near the saphenofemoral junction     Assessment:     Successful laser ablation right great saphenous vein with multiple stab phlebectomy of painful  varicosities-excellent early results    Plan:     We'll not plan treatment of the left leg at this time because of patient's history of coronary artery disease with previous stent placement although he does have reflux in the left great saphenous vein with secondary varicosities Return to see me on a when necessary basis if there is any worsening of his left leg symptoms

## 2017-03-15 ENCOUNTER — Emergency Department (HOSPITAL_BASED_OUTPATIENT_CLINIC_OR_DEPARTMENT_OTHER)
Admit: 2017-03-15 | Discharge: 2017-03-15 | Disposition: A | Discharge status: Home / Self Care | Payer: Self-pay | Attending: Emergency Medicine | Admitting: Emergency Medicine

## 2017-03-15 ENCOUNTER — Emergency Department (HOSPITAL_COMMUNITY): Payer: Self-pay

## 2017-03-15 ENCOUNTER — Other Ambulatory Visit: Payer: Self-pay

## 2017-03-15 ENCOUNTER — Observation Stay (HOSPITAL_COMMUNITY)
Admission: EM | Admit: 2017-03-15 | Discharge: 2017-03-16 | Disposition: A | Discharge status: Home / Self Care | Payer: Self-pay | Attending: Cardiovascular Disease | Admitting: Cardiovascular Disease

## 2017-03-15 ENCOUNTER — Other Ambulatory Visit (HOSPITAL_COMMUNITY): Payer: Self-pay | Admitting: Cardiology

## 2017-03-15 ENCOUNTER — Encounter (HOSPITAL_COMMUNITY): Payer: Self-pay | Admitting: Emergency Medicine

## 2017-03-15 DIAGNOSIS — F17210 Nicotine dependence, cigarettes, uncomplicated: Secondary | ICD-10-CM | POA: Insufficient documentation

## 2017-03-15 DIAGNOSIS — E1151 Type 2 diabetes mellitus with diabetic peripheral angiopathy without gangrene: Secondary | ICD-10-CM | POA: Insufficient documentation

## 2017-03-15 DIAGNOSIS — Z79899 Other long term (current) drug therapy: Secondary | ICD-10-CM | POA: Insufficient documentation

## 2017-03-15 DIAGNOSIS — W19XXXA Unspecified fall, initial encounter: Secondary | ICD-10-CM

## 2017-03-15 DIAGNOSIS — R072 Precordial pain: Secondary | ICD-10-CM | POA: Insufficient documentation

## 2017-03-15 DIAGNOSIS — I252 Old myocardial infarction: Secondary | ICD-10-CM | POA: Insufficient documentation

## 2017-03-15 DIAGNOSIS — E1165 Type 2 diabetes mellitus with hyperglycemia: Secondary | ICD-10-CM | POA: Insufficient documentation

## 2017-03-15 DIAGNOSIS — Z886 Allergy status to analgesic agent status: Secondary | ICD-10-CM | POA: Insufficient documentation

## 2017-03-15 DIAGNOSIS — I10 Essential (primary) hypertension: Secondary | ICD-10-CM | POA: Insufficient documentation

## 2017-03-15 DIAGNOSIS — R42 Dizziness and giddiness: Secondary | ICD-10-CM

## 2017-03-15 DIAGNOSIS — E669 Obesity, unspecified: Secondary | ICD-10-CM | POA: Insufficient documentation

## 2017-03-15 DIAGNOSIS — I2510 Atherosclerotic heart disease of native coronary artery without angina pectoris: Secondary | ICD-10-CM | POA: Insufficient documentation

## 2017-03-15 DIAGNOSIS — R0789 Other chest pain: Principal | ICD-10-CM | POA: Insufficient documentation

## 2017-03-15 DIAGNOSIS — Z8546 Personal history of malignant neoplasm of prostate: Secondary | ICD-10-CM | POA: Insufficient documentation

## 2017-03-15 DIAGNOSIS — K219 Gastro-esophageal reflux disease without esophagitis: Secondary | ICD-10-CM | POA: Insufficient documentation

## 2017-03-15 DIAGNOSIS — Z6834 Body mass index (BMI) 34.0-34.9, adult: Secondary | ICD-10-CM | POA: Insufficient documentation

## 2017-03-15 DIAGNOSIS — E785 Hyperlipidemia, unspecified: Secondary | ICD-10-CM | POA: Insufficient documentation

## 2017-03-15 DIAGNOSIS — G4733 Obstructive sleep apnea (adult) (pediatric): Secondary | ICD-10-CM | POA: Insufficient documentation

## 2017-03-15 DIAGNOSIS — M79609 Pain in unspecified limb: Secondary | ICD-10-CM

## 2017-03-15 DIAGNOSIS — Z7902 Long term (current) use of antithrombotics/antiplatelets: Secondary | ICD-10-CM | POA: Insufficient documentation

## 2017-03-15 DIAGNOSIS — Z8679 Personal history of other diseases of the circulatory system: Secondary | ICD-10-CM | POA: Insufficient documentation

## 2017-03-15 HISTORY — DX: Hyperlipidemia, unspecified: E78.5

## 2017-03-15 HISTORY — DX: Type 2 diabetes mellitus without complications: E11.9

## 2017-03-15 LAB — BASIC METABOLIC PANEL
ANION GAP: 10 (ref 5–15)
BUN: 9 mg/dL (ref 6–20)
CALCIUM: 8.7 mg/dL — AB (ref 8.9–10.3)
CO2: 22 mmol/L (ref 22–32)
Chloride: 105 mmol/L (ref 101–111)
Creatinine, Ser: 0.76 mg/dL (ref 0.61–1.24)
GFR calc Af Amer: >60 mL/min (ref >60)
GLUCOSE: 127 mg/dL — AB (ref 65–99)
Potassium: 4.2 mmol/L (ref 3.5–5.1)
Sodium: 137 mmol/L (ref 135–145)

## 2017-03-15 LAB — CBC
HCT: 41.7 % (ref 39.0–52.0)
Hemoglobin: 14.2 g/dL (ref 13.0–17.0)
MCH: 31.6 pg (ref 26.0–34.0)
MCHC: 34.1 g/dL (ref 30.0–36.0)
MCV: 92.7 fL (ref 78.0–100.0)
PLATELETS: 175 10*3/uL (ref 150–400)
RBC: 4.5 MIL/uL (ref 4.22–5.81)
RDW: 13.8 % (ref 11.5–15.5)
WBC: 6 10*3/uL (ref 4.0–10.5)

## 2017-03-15 LAB — I-STAT TROPONIN, ED
TROPONIN I, POC: 0 ng/mL (ref 0.00–0.08)
Troponin i, poc: 0 ng/mL (ref 0.00–0.08)

## 2017-03-15 LAB — TROPONIN I: Troponin I: <0.03 ng/mL (ref <0.03)

## 2017-03-15 LAB — GLUCOSE, CAPILLARY
Glucose-Capillary: 131 mg/dL — ABNORMAL HIGH (ref 65–99)
Glucose-Capillary: 112 mg/dL — ABNORMAL HIGH (ref 65–99)

## 2017-03-15 MED ORDER — NITROGLYCERIN 0.4 MG SL SUBL: 0.4000 mg | SUBLINGUAL_TABLET | SUBLINGUAL | Status: DC | PRN

## 2017-03-15 MED ORDER — CLOPIDOGREL BISULFATE 75 MG PO TABS
75.0000 mg | ORAL_TABLET | Freq: Once | ORAL | Status: AC
  Administered 2017-03-15: 75 mg via ORAL
  Filled 2017-03-15: qty 1

## 2017-03-15 MED ORDER — LOSARTAN POTASSIUM 50 MG PO TABS
50.0000 mg | ORAL_TABLET | Freq: Every day | ORAL | Status: DC
  Administered 2017-03-15 – 2017-03-16 (×2): 50 mg via ORAL
  Filled 2017-03-15 (×3): qty 1

## 2017-03-15 MED ORDER — ATORVASTATIN CALCIUM 40 MG PO TABS
40.0000 mg | ORAL_TABLET | Freq: Every day | ORAL | Status: DC
  Administered 2017-03-15 – 2017-03-16 (×2): 40 mg via ORAL
  Filled 2017-03-15 (×2): qty 1

## 2017-03-15 MED ORDER — CLOPIDOGREL BISULFATE 75 MG PO TABS
75.0000 mg | ORAL_TABLET | Freq: Every day | ORAL | Status: DC
  Administered 2017-03-16: 75 mg via ORAL
  Filled 2017-03-15: qty 1

## 2017-03-15 MED ORDER — ONDANSETRON HCL 4 MG/2ML IJ SOLN: 4.0000 mg | Freq: Four times a day (QID) | INTRAMUSCULAR | Status: DC | PRN

## 2017-03-15 MED ORDER — HEPARIN SODIUM (PORCINE) 5000 UNIT/ML IJ SOLN
5000.0000 [IU] | Freq: Three times a day (TID) | INTRAMUSCULAR | Status: DC
  Administered 2017-03-15 – 2017-03-16 (×2): 5000 [IU] via SUBCUTANEOUS
  Filled 2017-03-15 (×2): qty 1

## 2017-03-15 MED ORDER — ENSURE ENLIVE PO LIQD
237.0000 mL | Freq: Two times a day (BID) | ORAL | Status: DC
Start: 2017-03-16 — End: 2017-03-16

## 2017-03-15 MED ORDER — LOSARTAN POTASSIUM 50 MG PO TABS: 50.0000 mg | ORAL_TABLET | Freq: Every day | ORAL | Status: DC

## 2017-03-15 MED ORDER — ACETAMINOPHEN 325 MG PO TABS: 650.0000 mg | ORAL_TABLET | ORAL | Status: DC | PRN

## 2017-03-15 NOTE — ED Provider Notes (Signed)
[facility] EMERGENCY DEPARTMENT Provider Note   CSN: [id number] Arrival date & time: 03/15/17  4098     History   Chief Complaint Chief Complaint  Patient presents with  . Dizziness  . Chest Pain  . Leg Pain  . Fall    HPI William Young is a 56 y.o. male.  HPI  Patient  works in night shift at [employer].  Yesterday evening he worked a 14-hour shift.  He came home yesterday around 8 AM and ate a heavy breakfast meal from [employer].  He then went to sleep until 9 PM and when he awakened he did not feel very well.  He had a headache and felt a little dizzy.  He got up and drank some soda and watch some television.  He then went back to sleep last night and awakened again at 4:30 AM.  He reports at that time he felt that he had some chest discomfort on the right side.  He tried drinking some coffee and he tried to rest again, in about 2 hours felt that he had chest pressure and aching in his right arm.  Family was trying to assist him to get in his vehicle to come to the hospital and he fell onto the floor.  He was supported and there was no associated injury.  He denies he had actual loss of consciousness at that time he just felt generally weak.  At that point they called EMS for transport.  Patient also points out that he had a varicose vein treatment in mid October right mid thigh.  He is not had fever chills or cough.  His wife reports she thinks he may be dehydrated because his urine has been very dark. Past Medical History:  Diagnosis Date  . CAD (coronary artery disease) 09/2014   a. NSTEMI >> LHC 5/16:  mLAD 15, pRCA 90, mRCA 40, RPLB3 80, EF normal with inf HK >> PCI:  BMS to pRCA and DES to RPLB3   . Diabetes mellitus without complication (HCC)   . GERD (gastroesophageal reflux disease)   . History of echocardiogram    Echo 6/16:  EF 50-55%, no RWMA, Gr 1 DD  . HTN (hypertension)   . Hyperglycemia   . OSA (obstructive sleep apnea)   . Tobacco use   .  Varicose veins of both lower extremities     Patient Active Problem List   Diagnosis Date Noted  . Varicose veins of right lower extremity with complications 10/27/2016  . HLD (hyperlipidemia) 09/28/2014  . Diabetes mellitus (HCC) 09/14/2014  . Obstructive sleep apnea 09/14/2014  . Morbid obesity (HCC) 09/12/2014  . Coronary artery disease   . NSTEMI, initial episode of care (HCC) 09/10/2014  . Essential hypertension 09/10/2014  . Hyperglycemia   . Tobacco use   . GERD (gastroesophageal reflux disease)     Past Surgical History:  Procedure Laterality Date  . ENDOVENOUS ABLATION SAPHENOUS VEIN W/ LASER Right 02/22/2017   endovenous laser ablation R GSV and stab phlebectomy >20 incisions R leg by Josephina Gip MD   . None         Home Medications    Prior to Admission medications   Medication Sig Start Date End Date Taking? Authorizing Provider  acetaminophen (TYLENOL) 325 MG tablet Take 650 mg every 6 (six) hours as needed by mouth for mild pain.   Yes [provider]  clopidogrel (PLAVIX) 75 MG tablet Take 1 tablet (75 mg total) by  mouth daily. 11/17/16  Yes [name], MD  atorvastatin (LIPITOR) 40 MG tablet Take 1 tablet (40 mg total) by mouth daily. 09/16/16 12/15/16  [name], MD  cephALEXin (KEFLEX) 500 MG capsule Take 1 capsule (500 mg total) by mouth 4 (four) times daily. Patient not taking: Reported on 03/15/2017 09/27/16   Bethel Born, PA-C  docusate sodium (COLACE) 100 MG capsule Take 1 capsule (100 mg total) by mouth every 12 (twelve) hours. Patient not taking: Reported on 03/01/2017 10/26/16   Long, Arlyss Repress, MD  lidocaine (LIDODERM) 5 % Place 1 patch onto the skin daily. Remove & Discard patch within 12 hours or as directed by MD Patient not taking: Reported on 10/27/2016 10/26/16   Long, Arlyss Repress, MD  losartan (COZAAR) 50 MG tablet Take 1 tablet (50 mg total) by mouth daily. 09/16/16 12/15/16  [name], MD  oxyCODONE-acetaminophen  (PERCOCET/ROXICET) 5-325 MG tablet Take 1 tablet by mouth every 6 (six) hours as needed for severe pain. Patient not taking: Reported on 02/09/2017 10/26/16   Long, Arlyss Repress, MD    Family History Family History  Problem Relation Age of Onset  . Heart attack Maternal Uncle   . Heart attack Paternal Grandmother   . Cancer Maternal Grandfather   . Heart attack Maternal Grandmother   . Stroke Neg Hx   . Hypertension Neg Hx     Social History Social History   Tobacco Use  . Smoking status: Current Every Day Smoker    Packs/day: 1.00    Years: 35.00    Pack years: 35.00    Types: Cigarettes    Last attempt to quit: 09/10/2014    Years since quitting: 2.5  . Smokeless tobacco: Never Used  Substance Use Topics  . Alcohol use: Yes    Alcohol/week: 0.0 oz    Comment: occassional  . Drug use: No     Allergies   Aspirin and Ibuprofen   Review of Systems Review of Systems 10 Systems reviewed and are negative for acute change except as noted in the HPI.   Physical Exam Updated Vital Signs BP 137/64   Pulse (!) 41   Temp 97.8 F (36.6 C)   Resp 16   Ht 6\' 4"  (1.93 m)   Wt 129.3 kg (285 lb)   SpO2 97%   BMI 34.69 kg/m   Physical Exam  Constitutional: He is oriented to person, place, and time. He appears well-developed and well-nourished.  Patient is alert and nontoxic.  No respiratory distress.  Physically deconditioned.  HENT:  Head: Normocephalic and atraumatic.  Eyes: Conjunctivae and EOM are normal. Pupils are equal, round, and reactive to light.  Neck: Neck supple.  Cardiovascular: Normal rate, regular rhythm, intact distal pulses and normal pulses.  No murmur heard. Pulmonary/Chest: Effort normal and breath sounds normal. No respiratory distress.  Abdominal: Soft. There is no tenderness.  Musculoskeletal: Normal range of motion. He exhibits no edema.       Right lower leg: He exhibits tenderness.  Patient has well-healed vein ablation site in the right thigh.   There is a small nodule that feels compatible with scarring.  No erythema or gross swelling.  Does endorse tenderness in the popliteal fossa.  Neurological: He is alert and oriented to person, place, and time. He is not disoriented. No cranial nerve deficit.  All movements are coordinated purposeful and symmetric.  Normal and symmetric use right and left upper extremities and lower extremities.  Skin: Skin  is warm and dry.  Psychiatric: He has a normal mood and affect.  Nursing note and vitals reviewed.    ED Treatments / Results  Labs (all labs ordered are listed, but only abnormal results are displayed) Labs Reviewed  BASIC METABOLIC PANEL - Abnormal; Notable for the following components:      Result Value   Glucose, Bld 127 (*)    Calcium 8.7 (*)    All other components within normal limits  CBC  I-STAT TROPONIN, ED  I-STAT TROPONIN, ED    EKG  EKG Interpretation  Date/Time:  Monday March 15 2017 06:49:56 EST Ventricular Rate:  55 PR Interval:    QRS Duration: 109 QT Interval:  458 QTC Calculation: 439 R Axis:   -33 Text Interpretation:  Sinus rhythm Left axis deviation Abnormal R-wave progression, early transition no change from previous Confirmed by Arby BarrettePfeiffer, Glorene Leitzke [phone]([id number]) on 03/15/2017 7:38:22 AM       Radiology Dg Chest 2 View  Result Date: 03/15/2017 CLINICAL DATA:  Chest and right arm pain, nausea, and lightheadedness upon waking this morning . EXAM: CHEST  2 VIEW COMPARISON:  10/26/2016 FINDINGS: The heart size and mediastinal contours are within normal limits. Both lungs are clear. No evidence of pneumothorax or hemothorax . Fractures of the left posterolateral fourth and fifth ribs are seen which are new since previous study. IMPRESSION: No active cardiopulmonary disease. Left fourth and fifth rib fractures, new since 10/26/2016 exam. Electronically Signed   By: Myles RosenthalJohn  Stahl M.D.   On: 03/15/2017 07:46   Ct Head Wo Contrast  Result Date: 03/15/2017 CLINICAL  DATA:  Dizziness EXAM: CT HEAD WITHOUT CONTRAST TECHNIQUE: Contiguous axial images were obtained from the base of the skull through the vertex without intravenous contrast. COMPARISON:  None. FINDINGS: Brain: The ventricles are normal in size and configuration. There is no intracranial mass, hemorrhage, extra-axial fluid collection, or midline shift. There is mild small vessel disease in the centra semiovale bilaterally. Elsewhere gray-white compartments appear normal. No acute infarct evident. Vascular: There is comparative increased attenuation in the right middle cerebral artery compared to the left. Significance of this finding is uncertain. This finding may represent earliest changes of hyperdense vessel. There is calcification in each carotid siphon region. Skull: The bony calvarium appears intact. Sinuses/Orbits: There is opacification in several ethmoid air cells bilaterally. Other visualized paranasal sinuses are clear. Orbits appear symmetric bilaterally. Other: Visualized mastoid air cells are clear. There is debris in the right external auditory canal. IMPRESSION: 1. There is increased attenuation in the right middle cerebral artery compared to the left. Significance of this finding is uncertain. This finding may represent early changes of so-called hyperdense vessel which could indicate a very early acute infarct in the right middle cerebral artery distribution. This finding may warrant MRI including diffusion imaging to further evaluate. 2. There is slight periventricular small vessel disease bilaterally. No well-defined acute infarct is seen. No hemorrhage, mass, or extra-axial fluid collection. 3.  There are foci of arterial vascular calcification. 4. Ethmoid sinus disease bilaterally, primarily anterior in location. 5.  There is probable cerumen in the right external auditory canal. These results were called by telephone at the time of interpretation on 03/15/2017 at 8:25 am to Dr. Arby BarretteMARCY Manu Rubey ,  who verbally acknowledged these results. Electronically Signed   By: Bretta BangWilliam  Woodruff III M.D.   On: 03/15/2017 08:27   Mr Brain Wo Contrast (neuro Protocol)  Result Date: 03/15/2017 CLINICAL DATA:  Lightheadedness and dizziness. EXAM: MRI HEAD  WITHOUT CONTRAST TECHNIQUE: Multiplanar, multiecho pulse sequences of the brain and surrounding structures were obtained without intravenous contrast. COMPARISON:  Head CT earlier today FINDINGS: Brain: No acute infarction, hemorrhage, hydrocephalus, extra-axial collection or mass lesion. Few FLAIR hyperintensities in the cerebral white matter and pons, nonspecific but often attributed to chronic small vessel ischemia. Patient does have multiple vascular risk factors. Single remote microhemorrhage in the right frontal white matter, nonspecific in isolation. Vascular: Major flow voids are preserved. Skull and upper cervical spine: Negative for marrow lesion Sinuses/Orbits: Minor mucosal thickening in the ethmoid sinuses. Negative orbits. Negative temporal bones. IMPRESSION: 1. No acute finding or explanation for symptoms. 2. Mild signal abnormality in the cerebral white matter and pons, likely chronic small vessel ischemia. Electronically Signed   By: Marnee Spring M.D.   On: 03/15/2017 10:31    Procedures Procedures (including critical care time)  Medications Ordered in ED Medications  losartan (COZAAR) tablet 50 mg (not administered)  clopidogrel (PLAVIX) tablet 75 mg (75 mg Oral Given 03/15/17 1226)     Initial Impression / Assessment and Plan / ED Course  I have reviewed the triage vital signs and the nursing notes.  Pertinent labs & imaging results that were available during my care of the patient were reviewed by me and considered in my medical decision making (see chart for details).    Consult: Cardiology  Final Clinical Impressions(s) / ED Diagnoses   Final diagnoses:  Dizziness  Fall, initial encounter  Precordial pain  History of  coronary artery disease  Patient history of present illness as per above.  Patient evaluated for both dizziness and near syncope\weakness type fall without any associated injury.  CT head showed some equivocal findings recommended by radiology for follow-up MRI which is found to be negative for CVA.  From perspective of any neurologic injury or etiology of symptoms, patient is cleared. Considerations for possible cardiac etiology to the patient's symptoms of weakness, dizziness and then this morning chest pressure.  He has remained stable while in the emergency department without active chest pain.  2 sets of cardiac enzymes are negative.  EKG does not show acute ischemic event.  She unfortunately has run out of his Plavix due to financial constraints increasing his risk of ischemia.  Will await cardiology consultation for definitive management of chest pain.  ED Discharge Orders    None       Arby Barrette, MD 03/16/17 [phone]

## 2017-03-15 NOTE — H&P (Signed)
Cardiology Admission History and Physical:   Patient ID: William Young; MRN: [id number]; DOB: 1961-01-27   Admission date: 03/15/2017  Primary Care Provider: Patient, No Pcp Per Primary Cardiologist: Dr. Tenny Craw  Chief Complaint:  Chest heaviness and dizzienss  Patient Profile:   William Young is a 56 y.o. male with a history of CAD, DM, GERD, HTN, HLD and varicose vein s/p ablation presented for chest pain and dizziness.   Admitted 5/2-5/4 with a non-STEMI. On the date of admission, he awoke with sudden onset chest pain. LHC demonstrated single-vessel CAD with 90% proximal RCA and 80% right posterior lateral branch #3 stenosis. He underwent PCI with a BMS to the proximal RCA and a resolute DES to the RPLB3. The patient was not placed on aspirin due to history of true allergy with anaphylaxis. Treated with Brilinta for 1 year. Currently on Plavix.   He was doing well on cardiac standpoint when last seen by Dr. Tenny Craw  10/19/2016.  Recently underwent laser ablation right great saphenous vein plus multiple stab phlebectomy of painful varicosities performed under local tumescent anesthesia by Dr. Hart Rochester 02/2017.  History of Present Illness:   William Young presented with chest pain and dizziness.  He was worked extended hours over the weekend. He works at [employer].  Had a intermittent dizziness and headache yesterday.  It felt better with laying down.  Workup with dizziness and felt better after drinking water.  Afterwards his dizziness intermittently persisted and developed chest heaviness similar to prior angina but less intensity.  Felt weak while trying to get in the car and fall witnessed by family.  No loss of consciousness.  EMS was called and transferred to [facility] ER.  Patient has ran out of Plavix for past [redacted] weeks along with Lipitor and losartan.  EKG shows normal sinus rhythm at rate of 55 bpm.  No acute ischemic changes.  Personally reviewed. Telemetry sinus rhythm at  rate of 50s with frequent PACs-personally reviewed.  Troponin negative. MRI of brain without acute abnormality.      Past Medical History:  Diagnosis Date  . CAD (coronary artery disease) 09/2014   a. NSTEMI >> LHC 5/16:  mLAD 15, pRCA 90, mRCA 40, RPLB3 80, EF normal with inf HK >> PCI:  BMS to pRCA and DES to RPLB3   . Diabetes mellitus without complication (HCC)   . GERD (gastroesophageal reflux disease)   . History of echocardiogram    Echo 6/16:  EF 50-55%, no RWMA, Gr 1 DD  . HTN (hypertension)   . Hyperglycemia   . OSA (obstructive sleep apnea)   . Tobacco use   . Varicose veins of both lower extremities     Past Surgical History:  Procedure Laterality Date  . ENDOVENOUS ABLATION SAPHENOUS VEIN W/ LASER Right 02/22/2017   endovenous laser ablation R GSV and stab phlebectomy >20 incisions R leg by Josephina Gip MD   . None       Medications Prior to Admission: Prior to Admission medications   Medication Sig Start Date End Date Taking? Authorizing Provider  acetaminophen (TYLENOL) 325 MG tablet Take 650 mg every 6 (six) hours as needed by mouth for mild pain.   Yes [provider]  clopidogrel (PLAVIX) 75 MG tablet Take 1 tablet (75 mg total) by mouth daily. 11/17/16  Yes [name], MD  atorvastatin (LIPITOR) 40 MG tablet Take 1 tablet (40 mg total) by mouth daily. 09/16/16 12/15/16  [name], MD  cephALEXin (KEFLEX) 500 MG capsule Take 1 capsule (500 mg total) by mouth 4 (four) times daily. Patient not taking: Reported on 03/15/2017 09/27/16   Bethel Born, PA-C  docusate sodium (COLACE) 100 MG capsule Take 1 capsule (100 mg total) by mouth every 12 (twelve) hours. Patient not taking: Reported on 03/01/2017 10/26/16   Long, Arlyss Repress, MD  lidocaine (LIDODERM) 5 % Place 1 patch onto the skin daily. Remove & Discard patch within 12 hours or as directed by MD Patient not taking: Reported on 10/27/2016 10/26/16   Long, Arlyss Repress, MD  losartan (COZAAR) 50 MG tablet  Take 1 tablet (50 mg total) by mouth daily. 09/16/16 12/15/16  [name], MD  oxyCODONE-acetaminophen (PERCOCET/ROXICET) 5-325 MG tablet Take 1 tablet by mouth every 6 (six) hours as needed for severe pain. Patient not taking: Reported on 02/09/2017 10/26/16   Long, Arlyss Repress, MD     Allergies:    Allergies  Allergen Reactions  . Aspirin Anaphylaxis, Swelling and Rash    Throat closes   . Ibuprofen Anaphylaxis, Swelling and Rash    Social History:   Social History   Socioeconomic History  . Marital status: Widowed    Spouse name: Not on file  . Number of children: Not on file  . Years of education: Not on file  . Highest education level: Not on file  Social Needs  . Financial resource strain: Not on file  . Food insecurity - worry: Not on file  . Food insecurity - inability: Not on file  . Transportation needs - medical: Not on file  . Transportation needs - non-medical: Not on file  Occupational History  . Occupation: Event organiser systems  Tobacco Use  . Smoking status: Current Every Day Smoker    Packs/day: 1.00    Years: 35.00    Pack years: 35.00    Types: Cigarettes    Last attempt to quit: 09/10/2014    Years since quitting: 2.5  . Smokeless tobacco: Never Used  Substance and Sexual Activity  . Alcohol use: Yes    Alcohol/week: 0.0 oz    Comment: occassional  . Drug use: No  . Sexual activity: Yes  Other Topics Concern  . Not on file  Social History Narrative   Lives with fiance and 2 daughters. Neither his parents nor siblings have any cardiac issues that he knows of.    Family History:  The patient's family history includes Cancer in his maternal grandfather; Heart attack in his maternal grandmother, maternal uncle, and paternal grandmother. There is no history of Stroke or Hypertension.    ROS:  Please see the history of present illness.  All other ROS reviewed and negative.     Physical Exam/Data:   Vitals:   03/15/17 1115 03/15/17 1130  03/15/17 1145 03/15/17 1200  BP: (!) 149/70 (!) 150/78 (!) 165/63 137/64  Pulse: (!) 41 98 (!) 41 (!) 41  Resp: (!) 22 18 18 16   Temp:      SpO2: 99% 98% 98% 97%  Weight:      Height:       No intake or output data in the 24 hours ending 03/15/17 1235 Filed Weights   03/15/17 0644  Weight: 285 lb (129.3 kg)   Body mass index is 34.69 kg/m.  General:  Well nourished, well developed, in no acute distress HEENT: normal Lymph: no adenopathy Neck: noJVD Endocrine:  No thryomegaly Vascular: No carotid bruits; FA pulses 2+ bilaterally without  bruits  Cardiac:  normal S1, S2; RRR; no murmur  Lungs:  clear to auscultation bilaterally, no wheezing, rhonchi or rales  Abd: soft, nontender, no hepatomegaly  Ext: no edema Musculoskeletal:  No deformities, BUE and BLE strength normal and equal Skin: warm and dry  Neuro:  CNs 2-12 intact, no focal abnormalities noted Psych:  Normal affect    Relevant CV Studies: Cath 09/2014 Coronary Stent Intervention  Left Heart Cath and Coronary Angiography  Conclusion    Severe 2 site CAD of the prox RCA and RPL1 (noted as RPL3) - both sites treated with PCI: BMS prox RCA, DES RPL  Prox RCA lesion, 90% stenosed. There is a 0% residual stenosis post intervention.  A bare metal stent was placed. MultiLink Vision BMS 5.0 mm x 18 mm (5.2 mm)  A drug-eluting stent was placed. Resolute DES 2.5 mm x 14 mm (2.8 mm)  Normal LV Function & LVEDP  3rd RPLB lesion, 80% stenosed. There is a 0% residual stenosis post intervention.   NSTEMI - CAD s/p PCI to 2 Lesions in the RCA system ASA Anaphylaxis history  Recommend: Standard post Radial PCI care with TR Band removal With ASA allergy - limited to Brilinta alone --> recommend 1 year minimum (would convert to Plavix at 6-12 months for continued coverage) Continue aggressive Cardiac Risk Factor modification - BP, lipid & glycemic control along with smoking cessation counseling & wgt loss. Care  Management Consult for Brilinta    Echo 10/2014 Left ventricle: The cavity size was mildly dilated. Systolic   function was normal. The estimated ejection fraction was in the   range of 50% to 55%. Wall motion was normal; there were no   regional wall motion abnormalities. Doppler parameters are   consistent with abnormal left ventricular relaxation (grade 1   diastolic dysfunction). There was no evidence of elevated   ventricular filling pressure by Doppler parameters. - Aortic valve: Trileaflet; normal thickness leaflets. There was no   regurgitation. - Aortic root: The aortic root was normal in size. - Mitral valve: Structurally normal valve. There was no   regurgitation. - Right ventricle: The cavity size was normal. Wall thickness was   normal. Systolic function was normal. - Right atrium: The atrium was normal in size. - Tricuspid valve: There was trivial regurgitation. - Pulmonic valve: There was no regurgitation. - Pulmonary arteries: Systolic pressure was within the normal   range. - Inferior vena cava: The vessel was normal in size. - Pericardium, extracardiac: There was no pericardial effusion.  Impressions:  - Mildly dilated left ventricle with low normal LVEF> No regional   wall motion abnormalities.   Impaired relaxation.   Normal RV size and function.   No valvular abnormalities.   Laboratory Data:  Chemistry Recent Labs  Lab 03/15/17 0655  NA 137  K 4.2  CL 105  CO2 22  GLUCOSE 127*  BUN 9  CREATININE 0.76  CALCIUM 8.7*  GFRNONAA >60  GFRAA >60  ANIONGAP 10    No results for input(s): PROT, ALBUMIN, AST, ALT, ALKPHOS, BILITOT in the last 168 hours. Hematology Recent Labs  Lab 03/15/17 0655  WBC 6.0  RBC 4.50  HGB 14.2  HCT 41.7  MCV 92.7  MCH 31.6  MCHC 34.1  RDW 13.8  PLT 175   Cardiac EnzymesNo results for input(s): TROPONINI in the last 168 hours.  Recent Labs  Lab 03/15/17 0706  TROPIPOC 0.00    BNPNo results for input(s):  BNP, PROBNP in  the last 168 hours.  DDimer No results for input(s): DDIMER in the last 168 hours.  Radiology/Studies:  Dg Chest 2 View  Result Date: 03/15/2017 CLINICAL DATA:  Chest and right arm pain, nausea, and lightheadedness upon waking this morning . EXAM: CHEST  2 VIEW COMPARISON:  10/26/2016 FINDINGS: The heart size and mediastinal contours are within normal limits. Both lungs are clear. No evidence of pneumothorax or hemothorax . Fractures of the left posterolateral fourth and fifth ribs are seen which are new since previous study. IMPRESSION: No active cardiopulmonary disease. Left fourth and fifth rib fractures, new since 10/26/2016 exam. Electronically Signed   By: Myles Rosenthal M.D.   On: 03/15/2017 07:46   Ct Head Wo Contrast  Result Date: 03/15/2017 CLINICAL DATA:  Dizziness EXAM: CT HEAD WITHOUT CONTRAST TECHNIQUE: Contiguous axial images were obtained from the base of the skull through the vertex without intravenous contrast. COMPARISON:  None. FINDINGS: Brain: The ventricles are normal in size and configuration. There is no intracranial mass, hemorrhage, extra-axial fluid collection, or midline shift. There is mild small vessel disease in the centra semiovale bilaterally. Elsewhere gray-white compartments appear normal. No acute infarct evident. Vascular: There is comparative increased attenuation in the right middle cerebral artery compared to the left. Significance of this finding is uncertain. This finding may represent earliest changes of hyperdense vessel. There is calcification in each carotid siphon region. Skull: The bony calvarium appears intact. Sinuses/Orbits: There is opacification in several ethmoid air cells bilaterally. Other visualized paranasal sinuses are clear. Orbits appear symmetric bilaterally. Other: Visualized mastoid air cells are clear. There is debris in the right external auditory canal. IMPRESSION: 1. There is increased attenuation in the right middle cerebral  artery compared to the left. Significance of this finding is uncertain. This finding may represent early changes of so-called hyperdense vessel which could indicate a very early acute infarct in the right middle cerebral artery distribution. This finding may warrant MRI including diffusion imaging to further evaluate. 2. There is slight periventricular small vessel disease bilaterally. No well-defined acute infarct is seen. No hemorrhage, mass, or extra-axial fluid collection. 3.  There are foci of arterial vascular calcification. 4. Ethmoid sinus disease bilaterally, primarily anterior in location. 5.  There is probable cerumen in the right external auditory canal. These results were called by telephone at the time of interpretation on 03/15/2017 at 8:25 am to Dr. Arby Barrette , who verbally acknowledged these results. Electronically Signed   By: Bretta Bang III M.D.   On: 03/15/2017 08:27   William Brain Wo Contrast (neuro Protocol)  Result Date: 03/15/2017 CLINICAL DATA:  Lightheadedness and dizziness. EXAM: MRI HEAD WITHOUT CONTRAST TECHNIQUE: Multiplanar, multiecho pulse sequences of the brain and surrounding structures were obtained without intravenous contrast. COMPARISON:  Head CT earlier today FINDINGS: Brain: No acute infarction, hemorrhage, hydrocephalus, extra-axial collection or mass lesion. Few FLAIR hyperintensities in the cerebral white matter and pons, nonspecific but often attributed to chronic small vessel ischemia. Patient does have multiple vascular risk factors. Single remote microhemorrhage in the right frontal white matter, nonspecific in isolation. Vascular: Major flow voids are preserved. Skull and upper cervical spine: Negative for marrow lesion Sinuses/Orbits: Minor mucosal thickening in the ethmoid sinuses. Negative orbits. Negative temporal bones. IMPRESSION: 1. No acute finding or explanation for symptoms. 2. Mild signal abnormality in the cerebral white matter and pons, likely  chronic small vessel ischemia. Electronically Signed   By: Marnee Spring M.D.   On: 03/15/2017 10:31  Assessment and Plan:   1. Chest heaviness -Has both typical and atypical features.  Similar to prior angina but less intensity.  EKG without acute ischemic changes.  Troponin negative. -Can eat today.  We will review plan with MD.  Currently chest pain-free.  He ran out of his Plavix approximately 2 weeks ago. -Needs rule out arrhythmia/bradycardia.  Telemetry shows sinus rhythm at rate mostly of mid 50s.  Intermittently dropping to 40s and PACs.  He is not on any AV blocking agent.  2.  Hyperlipidemia - 09/11/2016: Cholesterol, Total 219; HDL 44; LDL Calculated 154; Triglycerides 105  -LDL not at goal of 70.  Check lipid panel.  Resume Lipitor 80 mg daily.  3.  Hypertension -Elevated on presentation.  Resume losartan 50 mg daily.  Severity of Illness: The appropriate patient status for this patient is OBSERVATION. Observation status is judged to be reasonable and necessary in order to provide the required intensity of service to ensure the patient's safety. The patient's presenting symptoms, physical exam findings, and initial radiographic and laboratory data in the context of their medical condition is felt to place them at decreased risk for further clinical deterioration. Furthermore, it is anticipated that the patient will be medically stable for discharge from the hospital within 2 midnights of admission. The following factors support the patient status of observation.   " The patient's presenting symptoms include dizziness and mild chest tightness. " The physical exam findings include .  See below. " The initial radiographic and laboratory data are .  Initial troponin negative     For questions or updates, please contact [facility] Please consult [email or web address] for contact info under Cardiology/STEMI.    Lorelei Pont, PA  03/15/2017 12:35 PM   Patient seen  and examined. Agree with assessment and plan. William Young is a 56 year old gentleman who has a history of CAD, type 2 diabetes mellitus, obesity, hypertension, hyperlipidemia, and lower extremity varicose veins, status post ablation.  In 2016 he underwent stenting to his proximal RCA as well as stent to his PLA vessel. He quit smoking for approximately 3 months but then resumed.  He has a history of an allergy to aspirin and had been initially treated with Brilinta for 1 year and then was switched to Plavix.  He ran out of his Plavix several weeks ago.  He had been on losartan for hypertension and atorvastatin for hyperlipidemia and has not taken any over the past several weeks. Over this past weekend, he was working long hours and typically works the 3rd shift at [employer] typically starts work at [employer]. The past several days he has noticed episodes of dizziness upon awakening [place] has experienced the chest pressure with some right arm radiation that mimic some of the chest pain that he experienced in 2016, although much less intense.  He presented to the emergency room. Initial ECG revealed sinus rhythm at 55 without ST segment changes or ectopy.  Physical exam is notable that he appears older than stated age. HEENT is unremarkable. Mallinpatti scale 3.  He has a thick neck.  Lungs were clear.  There is no chest wall tenderness.  Rhythm was regular with no ectopy.  There was faint 1/6 systolic murmur.  He had central adiposity.he has venous insufficiency and lower extremities with varicosities. Blood pressure was elevated on presentation.  He is been out of his medication.  We will resume losartan 50 mg daily as well as atorvastatin 80 mg daily for hyperlipidemia.  LDL  cholesterol was significantly increased at 154.  Platelet therapy with Plavix.  Cardiac enzymes will be monitored as well as serial ECG.  We will made overnibservation.  Depending upon his hospital course, consider subsequent outpatient nuclear  stress testing once medical therapy is resumed versus definitive catheterization if recurrent symptoms develop and are suggestive of unstable angina.   Lennette Bihari, MD, [facility] 03/15/2017 2:36 PM

## 2017-03-15 NOTE — ED Triage Notes (Signed)
Pt BIB [facility] for lightheadness/dizziness, right leg pain, and chest pain. PT states he had recent leg (vein) surgery two weeks ago on his right leg and is having pain with a knot on the proximal thigh. Pt also c/o of some chest heaviness. States he has had an MI in the past and it feels similar. Pt also fell at home pta but did not LOC did not injury anything

## 2017-03-15 NOTE — Progress Notes (Signed)
VASCULAR LAB PRELIMINARY  PRELIMINARY  PRELIMINARY  PRELIMINARY  Right lower extremity venous duplex completed.    Preliminary report:  There is no DVT noted in the right lower extremity.  There is superficial thrombosis, throughout the greater saphenous vein from the proximal calf to the proximal thigh, which is to be expected post ablation.   Called report to Dr. Octavio GravesPfeiffer  William Young, [facility], RVT 03/15/2017, 8:46 AM

## 2017-03-15 NOTE — ED Notes (Signed)
Attempted report 

## 2017-03-15 NOTE — ED Notes (Addendum)
cards at bedside

## 2017-03-15 NOTE — ED Notes (Signed)
Transported to XRAY  

## 2017-03-15 NOTE — ED Notes (Signed)
Patient transported to MRI 

## 2017-03-16 ENCOUNTER — Other Ambulatory Visit: Payer: Self-pay | Admitting: Physician Assistant

## 2017-03-16 DIAGNOSIS — Z720 Tobacco use: Secondary | ICD-10-CM

## 2017-03-16 DIAGNOSIS — I25118 Atherosclerotic heart disease of native coronary artery with other forms of angina pectoris: Secondary | ICD-10-CM

## 2017-03-16 DIAGNOSIS — R0789 Other chest pain: Secondary | ICD-10-CM

## 2017-03-16 DIAGNOSIS — I25110 Atherosclerotic heart disease of native coronary artery with unstable angina pectoris: Secondary | ICD-10-CM

## 2017-03-16 LAB — CBC
HCT: 39.4 % (ref 39.0–52.0)
Hemoglobin: 13.1 g/dL (ref 13.0–17.0)
MCH: 30.8 pg (ref 26.0–34.0)
MCHC: 33.2 g/dL (ref 30.0–36.0)
MCV: 92.7 fL (ref 78.0–100.0)
PLATELETS: 153 10*3/uL (ref 150–400)
RBC: 4.25 MIL/uL (ref 4.22–5.81)
RDW: 13.8 % (ref 11.5–15.5)
WBC: 5.1 10*3/uL (ref 4.0–10.5)

## 2017-03-16 LAB — BASIC METABOLIC PANEL
Anion gap: 6 (ref 5–15)
BUN: 12 mg/dL (ref 6–20)
CALCIUM: 8.8 mg/dL — AB (ref 8.9–10.3)
CHLORIDE: 108 mmol/L (ref 101–111)
CO2: 23 mmol/L (ref 22–32)
CREATININE: 0.92 mg/dL (ref 0.61–1.24)
GFR calc non Af Amer: >60 mL/min (ref >60)
Glucose, Bld: 129 mg/dL — ABNORMAL HIGH (ref 65–99)
Potassium: 4.3 mmol/L (ref 3.5–5.1)
SODIUM: 137 mmol/L (ref 135–145)

## 2017-03-16 LAB — LIPID PANEL
CHOLESTEROL: 191 mg/dL (ref 0–200)
HDL: 28 mg/dL — ABNORMAL LOW (ref >40)
LDL Cholesterol: 130 mg/dL — ABNORMAL HIGH (ref 0–99)
Total CHOL/HDL Ratio: 6.8 RATIO
Triglycerides: 165 mg/dL — ABNORMAL HIGH (ref <150)
VLDL: 33 mg/dL (ref 0–40)

## 2017-03-16 LAB — HIV ANTIBODY (ROUTINE TESTING W REFLEX): HIV SCREEN 4TH GENERATION: NONREACTIVE

## 2017-03-16 LAB — TROPONIN I: Troponin I: <0.03 ng/mL (ref <0.03)

## 2017-03-16 MED ORDER — NITROGLYCERIN 0.4 MG SL SUBL: 0.4000 mg | SUBLINGUAL_TABLET | SUBLINGUAL | 12 refills | Status: DC | PRN

## 2017-03-16 MED ORDER — ATORVASTATIN CALCIUM 40 MG PO TABS: 40.0000 mg | ORAL_TABLET | Freq: Every day | ORAL | 3 refills | Status: DC

## 2017-03-16 MED ORDER — CLOPIDOGREL BISULFATE 75 MG PO TABS: 75.0000 mg | ORAL_TABLET | Freq: Every day | ORAL | 3 refills | Status: DC

## 2017-03-16 MED ORDER — LOSARTAN POTASSIUM 50 MG PO TABS: 50.0000 mg | ORAL_TABLET | Freq: Every day | ORAL | 3 refills | Status: DC

## 2017-03-16 MED FILL — ?CLOPIDOGREL 75MG TAB: 75 | 30 days supply | Qty: 30 | Fill #0

## 2017-03-16 MED FILL — LOSARTAN POTASSIUM 50 MG TA: 50 | 30 days supply | Qty: 30 | Fill #0

## 2017-03-16 MED FILL — ?ATORVASTATIN 40MG TABLET: 40 | 30 days supply | Qty: 30 | Fill #0

## 2017-03-16 MED FILL — NITROSTAT 0.4 MG TABLET SL: 0.4 | 25 days supply | Qty: 25 | Fill #0

## 2017-03-16 NOTE — Progress Notes (Signed)
Initial Nutrition Assessment  DOCUMENTATION CODES:   Obesity unspecified  INTERVENTION:   -Continue Ensure Enlive BID; each supplement provides 350 kcals and 20 grams of protein  NUTRITION DIAGNOSIS:   Inadequate oral intake related to social / environmental circumstances(works night shift, only eats one meal per day) as evidenced by energy intake < or equal to 50% for > or equal to 1 month, per patient/family report.  GOAL:   Patient will meet greater than or equal to 90% of their needs  MONITOR:   PO intake, Supplement acceptance, Labs, Weight trends  REASON FOR ASSESSMENT:   Malnutrition Screening Tool    ASSESSMENT:   Pt is a 56 year old male admitted for CP, headache, and dizziness on 11/4. PMH of CAD, DM, GERD, HTN, HLD, and varicose veins s/p ablation.Pt was admitted 5/2-5/4 with a non-STEMI.  Pt was advanced from NPO to a heart diet this morning. He reports being hungry and is ready to eat his next meal. Pt typically eats one meal per night before work and has done this for several months. Pt works the night shift at [employer] and sleeps during the day. His meal consists of leftover food at [employer] or a salad with chicken. He drinks two sodas during his shift but denies snacking. Encouraged pt to eat more during his shift as he is diabetic and should keep his blood glucose stable. Told pt that only eating once per day could contribute to his dizzy spells. He states its too busy at work to eat during his shift.  Pt reports losing 20 lbs in the past 1-2 months and feels that it is d/t his decreased intake. Pt reports a UBW of 300 lbs. Per weight records in chart, pt weighed 281 lbs on 11/6, 288 on 10/22, and 300 lbs on 6/19. This indicates a 6.3% weight loss in 5 months, insignificant for time frame.   Pt likely discharging today per Case Manager note.  Medications- Heparin  Labs- CBGs: 131-112, Ca 8.8 (L)   NUTRITION - FOCUSED PHYSICAL EXAM:    Most Recent  Value  Orbital Region  Mild depletion  Upper Arm Region  No depletion  Thoracic and Lumbar Region  No depletion  Buccal Region  No depletion  Temple Region  Mild depletion  Clavicle Bone Region  No depletion  Clavicle and Acromion Bone Region  No depletion  Scapular Bone Region  No depletion  Dorsal Hand  No depletion  Patellar Region  No depletion  Anterior Thigh Region  No depletion  Posterior Calf Region  No depletion  Edema (RD Assessment)  None  Hair  Reviewed  Eyes  Reviewed  Mouth  Reviewed  Skin  Reviewed  Nails  Reviewed       Diet Order:  Diet Heart Room service appropriate? Yes; Fluid consistency: Thin  EDUCATION NEEDS:   No education needs have been identified at this time  Skin:  Skin Assessment: Reviewed RN Assessment  Last BM:  11/5  Height:   Ht Readings from Last 1 Encounters:  03/15/17 6\' 4"  (1.93 m)    Weight:   Wt Readings from Last 1 Encounters:  03/16/17 281 lb 1.6 oz (127.5 kg)    Ideal Body Weight:  91.8 kg  BMI:  Body mass index is 34.22 kg/m.  Estimated Nutritional Needs:   Kcal:  2300-2500 kcals (25-27 kcal/ kg IBW)  Protein:  115-125 grams protein  Fluid:  >/= 2 L   Wynetta EmeryGrace [facility]  Intern Pager: [phone] 03/16/2017 12:50 PM

## 2017-03-16 NOTE — Discharge Summary (Signed)
Discharge Summary    Patient ID: William Young,  MRN: [id number], DOB/AGE: 56/12/1960 56 y.o.  Admit date: 03/15/2017 Discharge date: 03/16/2017  Primary Care Provider: Patient, No Pcp Per Primary Cardiologist: Dr. Tenny Crawoss  Discharge Diagnoses    Active Problems:   Chest pressure   Dizzienss  Tobacco smoking   HTn   HLd  Allergies Allergies  Allergen Reactions  . Aspirin Anaphylaxis, Swelling and Rash    Throat closes   . Ibuprofen Anaphylaxis, Swelling and Rash    Diagnostic Studies/Procedures    None   History of Present Illness   William Young is a 56 y.o. male with a history of CAD, DM, GERD, HTN, HLD and varicose vein s/p ablation presented for chest pain and dizziness.   Admitted 09/2014 with a non-STEMI. On the date of admission, he awoke with sudden onset chest pain. LHC demonstrated single-vessel CAD with 90% proximal RCA and 80% right posterior lateral branch #3 stenosis. He underwent PCI with a BMS to the proximal RCA and a resolute DES to the RPLB3. The patient was not placed on aspirin due to history of true allergy with anaphylaxis. Treated with Brilinta for 1 year. Currently on Plavix.   He was doing well on cardiac standpoint when last seen by Dr. Tenny Crawoss  10/19/2016.  Recently underwent laser ablation right great saphenous vein plus multiple stab phlebectomy of painful varicosities performed under local tumescent anesthesia by Dr. Hart RochesterLawson 02/2017.  William Young presented  To [facility] 03/15/17 with chest pain and dizziness.  He was worked extended hours over the weekend. He works at [employer].  Had a intermittent dizziness and headache yesterday.  It felt better with laying down.  Workup with dizziness and felt better after drinking water.  Afterwards his dizziness intermittently persisted and developed chest heaviness similar to prior angina but less intensity.  Felt weak while trying to get in the car and fall witnessed by family.  No loss of consciousness.   EMS was called and transferred to [facility].  Patient has ran out of Plavix for past [redacted] weeks along with Lipitor and losartan.  EKG shows normal sinus rhythm at rate of 55 bpm.  No acute ischemic changes. Personally reviewed. Telemetry sinus rhythm at rate of 50s with frequent PACs-personally reviewed.  Troponin negative. MRI of brain without acute abnormality.    Hospital Course     Consultants: None  The patient was admitted and ruled out. Troponin remained negative. Resumed home medication with improvement of blood pressure. No recurrent chest pain. No arrhythmia on monitor overnight.  Continue Plavix, Statin and losartan. Un able to add BB due to bradycardia. Will discharge home with plan for outpatient stress test.  03/16/2017: Cholesterol 191; HDL 28; LDL Cholesterol 130; Triglycerides 165; VLDL 33  The patient has been seen by Dr. Tresa EndoKelly today and deemed ready for discharge home. All follow-up appointments have been scheduled. Discharge medications are listed below.    Discharge Vitals Blood pressure 138/74, pulse 66, temperature 97.7 F (36.5 C), temperature source Oral, resp. rate 18, height 6\' 4"  (1.93 m), weight 281 lb 1.6 oz (127.5 kg), SpO2 97 %.  Filed Weights   03/15/17 0644 03/16/17 0452  Weight: 285 lb (129.3 kg) 281 lb 1.6 oz (127.5 kg)    Labs & Radiologic Studies     CBC Recent Labs    03/15/17 0655 03/16/17 0439  WBC 6.0 5.1  HGB 14.2 13.1  HCT 41.7 39.4  MCV  92.7 92.7  PLT 175 153   Basic Metabolic Panel Recent Labs    16/10/96 0655 03/16/17 0439  NA 137 137  K 4.2 4.3  CL 105 108  CO2 22 23  GLUCOSE 127* 129*  BUN 9 12  CREATININE 0.76 0.92  CALCIUM 8.7* 8.8*   Cardiac Enzymes Recent Labs    03/15/17 1644 03/15/17 2208 03/16/17 0439  TROPONINI <0.03 <0.03 <0.03   Fasting Lipid Panel Recent Labs    03/16/17 0439  CHOL 191  HDL 28*  LDLCALC 130*  TRIG 165*  CHOLHDL 6.8   Thyroid Function Tests No results for  input(s): TSH, T4TOTAL, T3FREE, THYROIDAB in the last 72 hours.  Invalid input(s): FREET3  Dg Chest 2 View  Result Date: 03/15/2017 CLINICAL DATA:  Chest and right arm pain, nausea, and lightheadedness upon waking this morning . EXAM: CHEST  2 VIEW COMPARISON:  10/26/2016 FINDINGS: The heart size and mediastinal contours are within normal limits. Both lungs are clear. No evidence of pneumothorax or hemothorax . Fractures of the left posterolateral fourth and fifth ribs are seen which are new since previous study. IMPRESSION: No active cardiopulmonary disease. Left fourth and fifth rib fractures, new since 10/26/2016 exam. Electronically Signed   By: Myles Rosenthal M.D.   On: 03/15/2017 07:46   Ct Head Wo Contrast  Result Date: 03/15/2017 CLINICAL DATA:  Dizziness EXAM: CT HEAD WITHOUT CONTRAST TECHNIQUE: Contiguous axial images were obtained from the base of the skull through the vertex without intravenous contrast. COMPARISON:  None. FINDINGS: Brain: The ventricles are normal in size and configuration. There is no intracranial mass, hemorrhage, extra-axial fluid collection, or midline shift. There is mild small vessel disease in the centra semiovale bilaterally. Elsewhere gray-white compartments appear normal. No acute infarct evident. Vascular: There is comparative increased attenuation in the right middle cerebral artery compared to the left. Significance of this finding is uncertain. This finding may represent earliest changes of hyperdense vessel. There is calcification in each carotid siphon region. Skull: The bony calvarium appears intact. Sinuses/Orbits: There is opacification in several ethmoid air cells bilaterally. Other visualized paranasal sinuses are clear. Orbits appear symmetric bilaterally. Other: Visualized mastoid air cells are clear. There is debris in the right external auditory canal. IMPRESSION: 1. There is increased attenuation in the right middle cerebral artery compared to the left.  Significance of this finding is uncertain. This finding may represent early changes of so-called hyperdense vessel which could indicate a very early acute infarct in the right middle cerebral artery distribution. This finding may warrant MRI including diffusion imaging to further evaluate. 2. There is slight periventricular small vessel disease bilaterally. No well-defined acute infarct is seen. No hemorrhage, mass, or extra-axial fluid collection. 3.  There are foci of arterial vascular calcification. 4. Ethmoid sinus disease bilaterally, primarily anterior in location. 5.  There is probable cerumen in the right external auditory canal. These results were called by telephone at the time of interpretation on 03/15/2017 at 8:25 am to Dr. Arby Barrette , who verbally acknowledged these results. Electronically Signed   By: Bretta Bang III M.D.   On: 03/15/2017 08:27   Mr Brain Wo Contrast (neuro Protocol)  Result Date: 03/15/2017 CLINICAL DATA:  Lightheadedness and dizziness. EXAM: MRI HEAD WITHOUT CONTRAST TECHNIQUE: Multiplanar, multiecho pulse sequences of the brain and surrounding structures were obtained without intravenous contrast. COMPARISON:  Head CT earlier today FINDINGS: Brain: No acute infarction, hemorrhage, hydrocephalus, extra-axial collection or mass lesion. Few FLAIR hyperintensities in the  cerebral white matter and pons, nonspecific but often attributed to chronic small vessel ischemia. Patient does have multiple vascular risk factors. Single remote microhemorrhage in the right frontal white matter, nonspecific in isolation. Vascular: Major flow voids are preserved. Skull and upper cervical spine: Negative for marrow lesion Sinuses/Orbits: Minor mucosal thickening in the ethmoid sinuses. Negative orbits. Negative temporal bones. IMPRESSION: 1. No acute finding or explanation for symptoms. 2. Mild signal abnormality in the cerebral white matter and pons, likely chronic small vessel ischemia.  Electronically Signed   By: Marnee Spring M.D.   On: 03/15/2017 10:31    Disposition   Pt is being discharged home today in good condition.  Follow-up Plans & Appointments    Follow-up Information    [facility] Follow up.   Why:  Please call on 11/12 for appointment Contact information: [address] [place] [address] [phone]       Manson Passey, [place]. Go on 04/13/2017.   Specialty:  Cardiology Why:  @1 :30 for post hosptial follow up  Office will call with time and date for stress test Contact information: [address] [place] [address] [phone]          Discharge Instructions    Diet - low sodium heart healthy   Complete by:  As directed    Increase activity slowly   Complete by:  As directed       Discharge Medications   Current Discharge Medication List    START taking these medications   Details  nitroGLYCERIN (NITROSTAT) 0.4 MG SL tablet Place 1 tablet (0.4 mg total) every 5 (five) minutes x 3 doses as needed under the tongue for chest pain. Qty: 25 tablet, Refills: 12      CONTINUE these medications which have CHANGED   Details  atorvastatin (LIPITOR) 40 MG tablet Take 1 tablet (40 mg total) daily by mouth. Qty: 90 tablet, Refills: 3    clopidogrel (PLAVIX) 75 MG tablet Take 1 tablet (75 mg total) daily by mouth. Qty: 90 tablet, Refills: 3    losartan (COZAAR) 50 MG tablet Take 1 tablet (50 mg total) daily by mouth. Qty: 90 tablet, Refills: 3      CONTINUE these medications which have NOT CHANGED   Details  acetaminophen (TYLENOL) 325 MG tablet Take 650 mg every 6 (six) hours as needed by mouth for mild pain.         Outstanding Labs/Studies   Stress test  Duration of Discharge Encounter   Greater than 30 minutes including physician time.  Signed, Thom Ollinger PA-C 03/16/2017, 1:32 PM

## 2017-03-16 NOTE — Progress Notes (Signed)
Progress Note  Patient Name: William NipperWilliam Young Date of Encounter: 03/16/2017  Primary Cardiologist: Dr. Tenny Crawoss  Subjective   Feeling well. No chest pain, sob or palpitations.   Inpatient Medications    Scheduled Meds: . atorvastatin  40 mg Oral Daily  . clopidogrel  75 mg Oral Daily  . feeding supplement (ENSURE ENLIVE)  237 mL Oral BID BM  . heparin  5,000 Units Subcutaneous Q8H  . losartan  50 mg Oral Daily   Continuous Infusions:  PRN Meds: acetaminophen, nitroGLYCERIN, ondansetron (ZOFRAN) IV   Vital Signs    Vitals:   03/15/17 1854 03/16/17 0040 03/16/17 0452 03/16/17 0800  BP: (!) 145/70 (!) 116/53 (!) 150/76 (!) 115/99  Pulse: 89 62 62 65  Resp: 19 18 18    Temp: 97.7 F (36.5 C) 98.2 F (36.8 C) (!) 97.4 F (36.3 C)   TempSrc:  Oral Oral   SpO2: 98% 96% 96%   Weight:   281 lb 1.6 oz (127.5 kg)   Height:        Intake/Output Summary (Last 24 hours) at 03/16/2017 1107 Last data filed at 03/16/2017 0839 Gross per 24 hour  Intake 0 ml  Output 2400 ml  Net -2400 ml   Filed Weights   03/15/17 0644 03/16/17 0452  Weight: 285 lb (129.3 kg) 281 lb 1.6 oz (127.5 kg)    Telemetry    Sr at rate of 50s - Personally Reviewed  ECG    N/A  Physical Exam   GEN: No acute distress.   Neck: No JVD Cardiac: RRR, no murmurs, rubs, or gallops.  Respiratory: Clear to auscultation bilaterally. GI: Soft, nontender, non-distended  MS: No edema; No deformity. Neuro:  Nonfocal  Psych: Normal affect   Labs    Chemistry Recent Labs  Lab 03/15/17 0655 03/16/17 0439  NA 137 137  K 4.2 4.3  CL 105 108  CO2 22 23  GLUCOSE 127* 129*  BUN 9 12  CREATININE 0.76 0.92  CALCIUM 8.7* 8.8*  GFRNONAA >60 >60  GFRAA >60 >60  ANIONGAP 10 6     Hematology Recent Labs  Lab 03/15/17 0655 03/16/17 0439  WBC 6.0 5.1  RBC 4.50 4.25  HGB 14.2 13.1  HCT 41.7 39.4  MCV 92.7 92.7  MCH 31.6 30.8  MCHC 34.1 33.2  RDW 13.8 13.8  PLT 175 153    Cardiac  Enzymes Recent Labs  Lab 03/15/17 1644 03/15/17 2208 03/16/17 0439  TROPONINI <0.03 <0.03 <0.03    Recent Labs  Lab 03/15/17 0706 03/15/17 1301  TROPIPOC 0.00 0.00    Lipid Panel     Component Value Date/Time   CHOL 191 03/16/2017 0439   CHOL 219 (H) 09/11/2016 1001   TRIG 165 (H) 03/16/2017 0439   HDL 28 (L) 03/16/2017 0439   HDL 44 09/11/2016 1001   CHOLHDL 6.8 03/16/2017 0439   VLDL 33 03/16/2017 0439   LDLCALC 130 (H) 03/16/2017 0439   LDLCALC 154 (H) 09/11/2016 1001   BNPNo results for input(s): BNP, PROBNP in the last 168 hours.   DDimer No results for input(s): DDIMER in the last 168 hours.   Radiology    Dg Chest 2 View  Result Date: 03/15/2017 CLINICAL DATA:  Chest and right arm pain, nausea, and lightheadedness upon waking this morning . EXAM: CHEST  2 VIEW COMPARISON:  10/26/2016 FINDINGS: The heart size and mediastinal contours are within normal limits. Both lungs are clear. No evidence of pneumothorax or hemothorax . Fractures  of the left posterolateral fourth and fifth ribs are seen which are new since previous study. IMPRESSION: No active cardiopulmonary disease. Left fourth and fifth rib fractures, new since 10/26/2016 exam. Electronically Signed   By: Myles RosenthalJohn  Stahl M.D.   On: 03/15/2017 07:46   Ct Head Wo Contrast  Result Date: 03/15/2017 CLINICAL DATA:  Dizziness EXAM: CT HEAD WITHOUT CONTRAST TECHNIQUE: Contiguous axial images were obtained from the base of the skull through the vertex without intravenous contrast. COMPARISON:  None. FINDINGS: Brain: The ventricles are normal in size and configuration. There is no intracranial mass, hemorrhage, extra-axial fluid collection, or midline shift. There is mild small vessel disease in the centra semiovale bilaterally. Elsewhere gray-white compartments appear normal. No acute infarct evident. Vascular: There is comparative increased attenuation in the right middle cerebral artery compared to the left. Significance of  this finding is uncertain. This finding may represent earliest changes of hyperdense vessel. There is calcification in each carotid siphon region. Skull: The bony calvarium appears intact. Sinuses/Orbits: There is opacification in several ethmoid air cells bilaterally. Other visualized paranasal sinuses are clear. Orbits appear symmetric bilaterally. Other: Visualized mastoid air cells are clear. There is debris in the right external auditory canal. IMPRESSION: 1. There is increased attenuation in the right middle cerebral artery compared to the left. Significance of this finding is uncertain. This finding may represent early changes of so-called hyperdense vessel which could indicate a very early acute infarct in the right middle cerebral artery distribution. This finding may warrant MRI including diffusion imaging to further evaluate. 2. There is slight periventricular small vessel disease bilaterally. No well-defined acute infarct is seen. No hemorrhage, mass, or extra-axial fluid collection. 3.  There are foci of arterial vascular calcification. 4. Ethmoid sinus disease bilaterally, primarily anterior in location. 5.  There is probable cerumen in the right external auditory canal. These results were called by telephone at the time of interpretation on 03/15/2017 at 8:25 am to Dr. Arby BarretteMARCY PFEIFFER , who verbally acknowledged these results. Electronically Signed   By: Bretta BangWilliam  Woodruff III M.D.   On: 03/15/2017 08:27   Mr Brain Wo Contrast (neuro Protocol)  Result Date: 03/15/2017 CLINICAL DATA:  Lightheadedness and dizziness. EXAM: MRI HEAD WITHOUT CONTRAST TECHNIQUE: Multiplanar, multiecho pulse sequences of the brain and surrounding structures were obtained without intravenous contrast. COMPARISON:  Head CT earlier today FINDINGS: Brain: No acute infarction, hemorrhage, hydrocephalus, extra-axial collection or mass lesion. Few FLAIR hyperintensities in the cerebral white matter and pons, nonspecific but often  attributed to chronic small vessel ischemia. Patient does have multiple vascular risk factors. Single remote microhemorrhage in the right frontal white matter, nonspecific in isolation. Vascular: Major flow voids are preserved. Skull and upper cervical spine: Negative for marrow lesion Sinuses/Orbits: Minor mucosal thickening in the ethmoid sinuses. Negative orbits. Negative temporal bones. IMPRESSION: 1. No acute finding or explanation for symptoms. 2. Mild signal abnormality in the cerebral white matter and pons, likely chronic small vessel ischemia. Electronically Signed   By: Marnee SpringJonathon  Watts M.D.   On: 03/15/2017 10:31    Cardiac Studies   None  Patient Profile     William NipperWilliam Young is a 56 y.o. male with a history of CAD, DM, GERD, HTN, HLD and varicose vein s/p ablation presented for chest pain and dizziness  Assessment & Plan    1. Chest pain - He ruled out. No recurrent chest pain while here. No arrhythmia on monitor overnight.  Continue Plavix, Statin and losartan. Un able to add  BB due to bradycardia. Will discharge home with plan for outpatient stress test.   2. Hyperlipidemia - 03/16/2017: Cholesterol 191; HDL 28; LDL Cholesterol 130; Triglycerides 165; VLDL 33  - Lipitor resumed  3. HTN - Elevated at presentation. Improving on Losatan 50mg  qd.   For questions or updates, please contact [facility] Please consult [email or web address] for contact info under Cardiology/STEMI.      Signed, Manson Passey, PA  03/16/2017, 11:07 AM     Patient seen and examined. Agree with assessment and plan. Feels well. No recurrent discomfort. Enzymes negative. OK for dc today with outpatient stress testing. Back on meds. Long discussion re Tobacco cessation. F/U Dr. Tenny Craw.   Lennette Bihari, MD, [facility] 03/16/2017 12:24 PM

## 2017-03-16 NOTE — Care Management Note (Signed)
Case Management Note  Patient Details  Name: William NipperWilliam Gowans MRN: [id number] Date of Birth: 10/10/1960  Subjective/Objective:   Chest Pressure                Action/Plan: Patient is independent of his ADL's; works at the [employer]; [facility] and use their pharmacy for medication;  Expected Discharge Date:   03/16/2017               Expected Discharge Plan:  Home/Self Care  In-House Referral:   Financial Counselor  Discharge planning Services  CM Consult  Status of Service:  In process, will continue to follow  Cherrie DistanceChandler, Thuan Tippett L, RN 03/16/2017, 11:13 AM

## 2017-03-16 NOTE — Clinical Social Work Note (Signed)
CSW acknowledges consult, "having problems financially getting my RX." Will notify RNCM in morning progression meeting.  CSW signing off. Consult again if any social work needs arise.  Charlynn CourtSarah Anaiah Mcmannis, CSW [phone]

## 2017-03-31 ENCOUNTER — Telehealth (HOSPITAL_COMMUNITY): Payer: Self-pay

## 2017-03-31 NOTE — Telephone Encounter (Signed)
Pt called without any response. He was advised that he needed to call us back this afternoon or first thing Monday am for further instructions. S.Ky Rumple EMTP

## 2017-04-05 ENCOUNTER — Ambulatory Visit (HOSPITAL_COMMUNITY): Payer: Self-pay | Attending: Cardiovascular Disease

## 2017-04-05 DIAGNOSIS — I2510 Atherosclerotic heart disease of native coronary artery without angina pectoris: Secondary | ICD-10-CM | POA: Insufficient documentation

## 2017-04-05 DIAGNOSIS — F17200 Nicotine dependence, unspecified, uncomplicated: Secondary | ICD-10-CM | POA: Insufficient documentation

## 2017-04-05 DIAGNOSIS — E119 Type 2 diabetes mellitus without complications: Secondary | ICD-10-CM | POA: Insufficient documentation

## 2017-04-05 DIAGNOSIS — R079 Chest pain, unspecified: Secondary | ICD-10-CM | POA: Insufficient documentation

## 2017-04-05 DIAGNOSIS — R42 Dizziness and giddiness: Secondary | ICD-10-CM | POA: Insufficient documentation

## 2017-04-05 DIAGNOSIS — I10 Essential (primary) hypertension: Secondary | ICD-10-CM | POA: Insufficient documentation

## 2017-04-05 DIAGNOSIS — R9439 Abnormal result of other cardiovascular function study: Secondary | ICD-10-CM | POA: Insufficient documentation

## 2017-04-05 DIAGNOSIS — I25110 Atherosclerotic heart disease of native coronary artery with unstable angina pectoris: Secondary | ICD-10-CM

## 2017-04-05 MED ORDER — TECHNETIUM TC 99M TETROFOSMIN IV KIT
32.7000 | PACK | Freq: Once | INTRAVENOUS | Status: AC | PRN
  Administered 2017-04-05: 32.7 via INTRAVENOUS
  Filled 2017-04-05: qty 33

## 2017-04-05 MED ORDER — REGADENOSON 0.4 MG/5ML IV SOLN
0.4000 mg | Freq: Once | INTRAVENOUS | Status: AC
  Administered 2017-04-05: 0.4 mg via INTRAVENOUS

## 2017-04-06 ENCOUNTER — Ambulatory Visit (HOSPITAL_COMMUNITY): Payer: Self-pay | Attending: Cardiology

## 2017-04-06 LAB — MYOCARDIAL PERFUSION IMAGING
CHL CUP NUCLEAR SSS: 6
CSEPPHR: 81 {beats}/min
LHR: 0.34
LV dias vol: 224 mL (ref 62–150)
LV sys vol: 140 mL
NUC STRESS TID: 1.04
Rest HR: 61 {beats}/min
SDS: 4
SRS: 2

## 2017-04-06 MED ORDER — TECHNETIUM TC 99M TETROFOSMIN IV KIT
31.4000 | PACK | Freq: Once | INTRAVENOUS | Status: AC | PRN
  Administered 2017-04-06: 31.4 via INTRAVENOUS
  Filled 2017-04-06: qty 32

## 2017-04-07 ENCOUNTER — Telehealth: Payer: Self-pay | Admitting: Internal Medicine

## 2017-04-07 DIAGNOSIS — I2510 Atherosclerotic heart disease of native coronary artery without angina pectoris: Secondary | ICD-10-CM

## 2017-04-07 NOTE — Telephone Encounter (Signed)
Returned patient's call. Reviewed stress test results with patient and advised patient that Manson PasseyBhavinkumar Bhagat, PA recommends getting an echo. Patient in agreement with plan. Informed patient that he should receive a call from schedulers to make that appt. Patient verbalized understanding and thanked me for the call.

## 2017-04-07 NOTE — Telephone Encounter (Signed)
New message ° °Pt verbalized that he is returning call for the rn  °

## 2017-04-07 NOTE — Telephone Encounter (Signed)
Per Pt returning this office call.

## 2017-04-07 NOTE — Telephone Encounter (Signed)
Returned pt's call and left another message

## 2017-04-09 ENCOUNTER — Ambulatory Visit (HOSPITAL_COMMUNITY): Payer: Self-pay | Attending: Cardiology

## 2017-04-09 ENCOUNTER — Other Ambulatory Visit: Payer: Self-pay

## 2017-04-09 DIAGNOSIS — I2510 Atherosclerotic heart disease of native coronary artery without angina pectoris: Secondary | ICD-10-CM

## 2017-04-09 DIAGNOSIS — I420 Dilated cardiomyopathy: Secondary | ICD-10-CM | POA: Insufficient documentation

## 2017-04-09 DIAGNOSIS — I5030 Unspecified diastolic (congestive) heart failure: Secondary | ICD-10-CM | POA: Insufficient documentation

## 2017-04-09 MED ORDER — PERFLUTREN LIPID MICROSPHERE
1.0000 mL | INTRAVENOUS | Status: AC | PRN
  Administered 2017-04-09: 3 mL via INTRAVENOUS

## 2017-04-12 ENCOUNTER — Telehealth: Payer: Self-pay | Admitting: Internal Medicine

## 2017-04-12 NOTE — Telephone Encounter (Signed)
Patient informed of echo results.  He is aware he has follow up with B. Bhagat, APP tomorrow.

## 2017-04-12 NOTE — Telephone Encounter (Signed)
New message ° ° ° °Patient calling for echo results. Please call °

## 2017-04-13 ENCOUNTER — Encounter (INDEPENDENT_AMBULATORY_CARE_PROVIDER_SITE_OTHER): Payer: Self-pay

## 2017-04-13 ENCOUNTER — Ambulatory Visit (INDEPENDENT_AMBULATORY_CARE_PROVIDER_SITE_OTHER): Payer: Self-pay | Admitting: Physician Assistant

## 2017-04-13 ENCOUNTER — Encounter: Payer: Self-pay | Admitting: Physician Assistant

## 2017-04-13 VITALS — BP 146/82 | HR 72 | Ht 76.0 in | Wt 293.4 lb

## 2017-04-13 DIAGNOSIS — I5032 Chronic diastolic (congestive) heart failure: Secondary | ICD-10-CM

## 2017-04-13 DIAGNOSIS — I10 Essential (primary) hypertension: Secondary | ICD-10-CM

## 2017-04-13 DIAGNOSIS — Z79899 Other long term (current) drug therapy: Secondary | ICD-10-CM

## 2017-04-13 DIAGNOSIS — G4730 Sleep apnea, unspecified: Secondary | ICD-10-CM

## 2017-04-13 DIAGNOSIS — E782 Mixed hyperlipidemia: Secondary | ICD-10-CM

## 2017-04-13 DIAGNOSIS — I2510 Atherosclerotic heart disease of native coronary artery without angina pectoris: Secondary | ICD-10-CM

## 2017-04-13 MED ORDER — HYDROCHLOROTHIAZIDE 25 MG PO TABS: 25.0000 mg | ORAL_TABLET | Freq: Every day | ORAL | 3 refills | Status: DC

## 2017-04-13 MED FILL — HYDROCHLOROTHIAZIDE 25 MG T: 25 | 30 days supply | Qty: 30 | Fill #0

## 2017-04-13 NOTE — Progress Notes (Signed)
Cardiology Office Note    Date:  04/13/2017   ID:  William Young, DOB 05/05/1961, MRN [id number]  PCP:  Patient, No Pcp Per  Cardiologist: Dr. Tenny Crawoss  Chief Complaint: Hospital follow up for chest pain   History of Present Illness:   William NipperWilliam Hoppel is a 56 y.o. male with a history of CAD, DM, GERD, HTN, HLD and varicose vein s/p ablation presented for follow up.   Admitted 09/2014 with a non-STEMI. On the date of admission, he awoke with sudden onset chest pain. LHC demonstrated single-vessel CAD with 90% proximal RCA and 80% right posterior lateral branch #3 stenosis. He underwent PCI with a BMS to the proximal RCA and a resolute DES to the RPLB3. The patient was not placed on aspirin due to history of true allergy with anaphylaxis.Treated withBrilintafor1 year. Currently on Plavix.  [facility].Cassidyp[facility]  [facility] with chest pain and dizziness. He was worked Comptrollerextendedhoursover the weekend. He works at [employer].Had a intermittent dizziness and headache. Patient has ran out of Plavix for past [redacted] weeks along with Lipitor and losartan--> all resumed. The patient was admitted and ruled out. Troponin remained negative. Resumed home medication with improvement of blood pressure. No recurrent chest pain. No arrhythmia on monitor overnight.  Un able to add BB due to bradycardia.  Outpatient stress test showed no reversible ischemia. Moderately dilated LV with severe global hypokinesis. EF of 38%. Follow up echo showed hypokinesis of the inferolateral wall with overall preserved LV systolic function (55-60%); mild LVH; mild dilated ascending aorta.   Here today for follow up.  No further chest pain or shortness of breath.  He has noted lower extremity edema after standing for long period of time.  He uses compression stocking.  Occasional excess salt intake.  No orthopnea, PND, palpitation, melena or blood in his stool or urine.  Patient had a sleep apnea however never started on  CPAP.  Past Medical History:  Diagnosis Date  . CAD (coronary artery disease) 09/2014   a. NSTEMI >> LHC 5/16:  mLAD 15, pRCA 90, mRCA 40, RPLB3 80, EF normal with inf HK >> PCI:  BMS to pRCA and DES to RPLB3   . GERD (gastroesophageal reflux disease)   . History of echocardiogram    Echo 6/16:  EF 50-55%, no RWMA, Gr 1 DD  . HLD (hyperlipidemia)    Hattie Perch/notes 03/15/2017  . HTN (hypertension)   . Hyperglycemia   . NSTEMI (non-ST elevated myocardial infarction) (HCC) 09/2016   Hattie Perch/notes 03/15/2017  . OSA (obstructive sleep apnea)    "dx'd; couldn't tolerate mask" (03/15/2017)  . Tobacco use   . Type II diabetes mellitus (HCC)   . Varicose veins of both lower extremities    S/P ablation 02/2017    Past Surgical History:  Procedure Laterality Date  . CARDIAC CATHETERIZATION N/A 09/11/2014   Procedure: Left Heart Cath and Coronary Angiography;  Surgeon: Marykay Lexavid W Harding, MD;  [facility];  Service: Cardiovascular;  Laterality: N/A;  . CARDIAC CATHETERIZATION  09/11/2014   Procedure: Coronary Stent Intervention;  Surgeon: Marykay Lexavid W Harding, MD;  [facility];  Service: Cardiovascular;;  . ENDOVENOUS ABLATION SAPHENOUS VEIN W/ LASER Right 02/22/2017   endovenous laser ablation R GSV and stab phlebectomy >20 incisions R leg by Josephina GipJames Lawson MD     Current Medications: Prior to Admission medications   Medication Sig Start Date End Date Taking? Authorizing Provider  acetaminophen (TYLENOL) 325 MG  tablet Take 650 mg every 6 (six) hours as needed by mouth for mild pain.    [provider]  atorvastatin (LIPITOR) 40 MG tablet Take 1 tablet (40 mg total) daily by mouth. 03/16/17   Manson PasseyBhagat, Yusra Ravert, PA  clopidogrel (PLAVIX) 75 MG tablet Take 1 tablet (75 mg total) daily by mouth. 03/16/17   Ervin Rothbauer, Sharrell KuBhavinkumar, PA  losartan (COZAAR) 50 MG tablet Take 1 tablet (50 mg total) daily by mouth. 03/16/17   Eliseo Withers, Sharrell KuBhavinkumar, PA  nitroGLYCERIN (NITROSTAT) 0.4 MG  SL tablet Place 1 tablet (0.4 mg total) every 5 (five) minutes x 3 doses as needed under the tongue for chest pain. 03/16/17   Manson PasseyBhagat, Mekesha Solomon, PA    Allergies:   Aspirin and Ibuprofen   Social History   Socioeconomic History  . Marital status: Widowed    Spouse name: None  . Number of children: None  . Years of education: None  . Highest education level: None  Social Needs  . Financial resource strain: None  . Food insecurity - worry: None  . Food insecurity - inability: None  . Transportation needs - medical: None  . Transportation needs - non-medical: None  Occupational History  . Occupation: Event organiserervice water purification systems  Tobacco Use  . Smoking status: Current Every Day Smoker    Packs/day: 1.00    Years: 40.00    Pack years: 40.00    Types: Cigarettes  . Smokeless tobacco: Former NeurosurgeonUser    Types: Chew  Substance and Sexual Activity  . Alcohol use: Yes    Alcohol/week: 3.6 oz    Types: 6 Cans of beer per week  . Drug use: No  . Sexual activity: Yes  Other Topics Concern  . None  Social History Narrative   Lives with fiance and 2 daughters. Neither his parents nor siblings have any cardiac issues that he knows of.     Family History:  The patient's family history includes Cancer in his maternal grandfather; Heart attack in his maternal grandmother, maternal uncle, and paternal grandmother.   ROS:   Please see the history of present illness.    ROS All other systems reviewed and are negative.   PHYSICAL EXAM:   VS:  BP (!) 146/82   Pulse 72   Ht 6\' 4"  (1.93 m)   Wt 293 lb 6.4 oz (133.1 kg)   SpO2 98%   BMI 35.71 kg/m    GEN: Well nourished, well developed, in no acute distress  HEENT: normal  Neck: no JVD, carotid bruits, or masses Cardiac: RRR; no murmurs, rubs, or gallops,no edema  Respiratory:  clear to auscultation bilaterally, normal work of breathing GI: soft, nontender, nondistended, + BS MS: no deformity or atrophy  Skin: warm and dry, no  rash Neuro:  Alert and Oriented x 3, Strength and sensation are intact Psych: euthymic mood, full affect  Wt Readings from Last 3 Encounters:  04/13/17 293 lb 6.4 oz (133.1 kg)  04/05/17 280 lb (127 kg)  03/16/17 281 lb 1.6 oz (127.5 kg)      Studies/Labs Reviewed:   EKG:  EKG is not ordered today.    Recent Labs: 09/11/2016: ALT 25; TSH 2.370 03/16/2017: BUN 12; Creatinine, Ser 0.92; Hemoglobin 13.1; Platelets 153; Potassium 4.3; Sodium 137   Lipid Panel    Component Value Date/Time   CHOL 191 03/16/2017 0439   CHOL 219 (H) 09/11/2016 1001   TRIG 165 (H) 03/16/2017 0439   HDL 28 (L) 03/16/2017 16100439  HDL 44 09/11/2016 1001   CHOLHDL 6.8 03/16/2017 0439   VLDL 33 03/16/2017 0439   LDLCALC 130 (H) 03/16/2017 0439   LDLCALC 154 (H) 09/11/2016 1001    Additional studies/ records that were reviewed today include:   Echocardiogram: 04/09/17 Study Conclusions  - Left ventricle: The cavity size was mildly dilated. Wall   thickness was increased in a pattern of mild LVH. Systolic   function was normal. The estimated ejection fraction was in the   range of 55% to 60%. There is hypokinesis of the   basalinferolateral myocardium. Doppler parameters are consistent   with abnormal left ventricular relaxation (grade 1 diastolic   dysfunction). - Ascending aorta: The ascending aorta was mildly dilated.  Impressions:  - Definity used; hypokinesis of the inferolateral wall with overall   preserved LV systolic function; mild LVH and LVE; mild diastolic   dysfunction; mildly dilated ascending aorta.  Stress test 04/06/17  Nuclear stress EF: 38%.  No T wave inversion was noted during stress.  There was no ST segment deviation noted during stress.  Defect 1: There is a medium defect of moderate severity.  This is an intermediate risk study.   Medium size, moderate severity fixed inferolateral perfusion defect, likely attenuation artifact. No reversible ischemia. Moderately  dilated LV with severe global hypokinesis. LVEF 38%. This is an intermediate risk study.     ASSESSMENT & PLAN:    1. CAD - No evidence of ischemia on recent stress test. Normal LVEF on echo. Continue Plavix and statin.   2. HTN - Elevated elevated. Continue Losartan 50mg  qd. Add HCTZ 25mg  qd.   3. HLD - 03/16/2017: Cholesterol 191; HDL 28; LDL Cholesterol 130; Triglycerides 165; VLDL 33  - Statin resumed during admission. LFT and Lipid in 4 weeks.   4. Chronic diastolic CHF - Mild edema. Cut back on salt. Continue compression stocking. Add HCTZ for edema and high blood pressure.  5. Sleep apnea - Patient has sleep study done last year but never started on CPAP. He endorse snoring and day time tiredness. Significant other had noted stopped breathing. Also noted nocturnal bradycardia during admission.  - Will send staff message for CPAP initiation. He is interested.    Medication Adjustments/Labs and Tests Ordered: Current medicines are reviewed at length with the patient today.  Concerns regarding medicines are outlined above.  Medication changes, Labs and Tests ordered today are listed in the Patient Instructions below. Patient Instructions  Medication Instructions:  Your physician has recommended you make the following change in your medication:  1. START Hydrochlorothiazide 25 mg daily  * If you need a refill on your cardiac medications before your next appointment, please call your pharmacy. *  Labwork: Your physician recommends that you return for lab work in: 2 weeks for a BMET (same day as hypertension clinic)  Your physician recommends that you return for lab work in: 4 weeks for lipid profile and liver function tests.  Testing/Procedures: None ordered  Follow-Up: Your physician recommends that you schedule a follow-up appointment in: 2 weeks with Hypertension clinic.  (you will blood work this day also - BMET)  Your physician recommends that you schedule a  follow-up appointment in: 3 months with Dr. Tenny Craw.  Thank you for choosing [facility]!!              Lorelei Pont, [place]  04/13/2017 2:25 PM    [facility] [address] [place], [place], [place]  [address] Phone: [phone])  [phone]; Fax: [phone]

## 2017-04-13 NOTE — Patient Instructions (Addendum)
Medication Instructions:  Your physician has recommended you make the following change in your medication:  1. START Hydrochlorothiazide 25 mg daily  * If you need a refill on your cardiac medications before your next appointment, please call your pharmacy. *  Labwork: Your physician recommends that you return for lab work in: 2 weeks for a BMET (same day as hypertension clinic)  Your physician recommends that you return for lab work in: 4 weeks for lipid profile and liver function tests.  Testing/Procedures: None ordered  Follow-Up: Your physician recommends that you schedule a follow-up appointment in: 2 weeks with Hypertension clinic.  (you will blood work this day also - BMET)  Your physician recommends that you schedule a follow-up appointment in: 3 months with Dr. Tenny Crawoss.  Thank you for choosing [facility]!!

## 2017-04-15 ENCOUNTER — Telehealth: Payer: Self-pay | Admitting: *Deleted

## 2017-04-15 MED FILL — LOSARTAN POTASSIUM 50 MG TA: 50 | 30 days supply | Qty: 30 | Fill #1

## 2017-04-15 MED FILL — ?ATORVASTATIN 40MG TABLET: 40 | 30 days supply | Qty: 30 | Fill #1

## 2017-04-15 NOTE — Telephone Encounter (Signed)
-----   Message from Baird LyonsSherri L Price, RN sent at 04/13/2017  5:46 PM EST ----- Regarding: CPAP Pt has had previous sleep study last year (06/2015).  Per Vin's note: - Patient has sleep study done last year but never started on CPAP. He endorse snoring and day time tiredness. Significant other had noted stopped breathing. Also noted nocturnal bradycardia during admission.  - Will send staff message for CPAP initiation. He is interested.   Please call and address this need w/ pt. Thx Sherri

## 2017-04-19 NOTE — Telephone Encounter (Signed)
You will need to check with sleep lab to see if he needs another PSG or can proceed with CPAP titration

## 2017-04-22 NOTE — Addendum Note (Signed)
Addended by: Burnetta SabinWITTY, Kendel Bessey K on: 04/22/2017 09:19 AM   Modules accepted: Orders

## 2017-04-22 NOTE — Telephone Encounter (Signed)
  Elliot CousinWitty, Jennifer, RMA  Reesa ChewJones, Castin Donaghue G, CMA        Pt needs a sleep study

## 2017-04-22 NOTE — Telephone Encounter (Signed)
Reached out to patient to inform him that he has been referred for another sleep study by his provider Vin Bhagat. Per the sleep lab since he never started his CPAP therapy he has to start all over. Patient verbalized understanding and agrees with treatment.

## 2017-04-29 ENCOUNTER — Ambulatory Visit (INDEPENDENT_AMBULATORY_CARE_PROVIDER_SITE_OTHER): Payer: Self-pay | Admitting: Pharmacist

## 2017-04-29 ENCOUNTER — Other Ambulatory Visit: Payer: Self-pay | Admitting: *Deleted

## 2017-04-29 VITALS — BP 114/78 | HR 80

## 2017-04-29 DIAGNOSIS — Z79899 Other long term (current) drug therapy: Secondary | ICD-10-CM

## 2017-04-29 DIAGNOSIS — I5032 Chronic diastolic (congestive) heart failure: Secondary | ICD-10-CM

## 2017-04-29 DIAGNOSIS — E782 Mixed hyperlipidemia: Secondary | ICD-10-CM

## 2017-04-29 DIAGNOSIS — I2510 Atherosclerotic heart disease of native coronary artery without angina pectoris: Secondary | ICD-10-CM

## 2017-04-29 DIAGNOSIS — I10 Essential (primary) hypertension: Secondary | ICD-10-CM

## 2017-04-29 NOTE — Patient Instructions (Signed)
It was nice to meet you today  Your blood pressure looks excellent today and is at goal < 130/6180mmHg  Continue taking your losartan and hydrochlorothiazide (HCTZ)  Call clinic with any concerns regarding your blood pressure

## 2017-04-29 NOTE — Progress Notes (Signed)
Patient ID: William NipperWilliam Chea                 DOB: 08/21/1960                      MRN: [id number]     HPI: William Young is a 56 y.o. male patient of Dr Tenny Crawoss referred by Chelsea AusVin Bhagat, PA to HTN clinic. PMH is significant for CAD s/p NSTEMI in 2016, DM, GERD, HTN, HLD, OSA, and varicose vein s/p ablation. At his last visit 2 weeks ago, BP was elevated to 146/82 and pt was started on HCTZ 25mg  daily. He presents today for follow up.  Pt presents today in good spirits. He reports tolerating his HCTZ well although he reports urinating more frequently. He takes his medications at 7:30 in the morning. 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 He has an optimistic personality and tries to keep his stress level down.  Current HTN meds: losartan 50mg  daily (AM), HCTZ 25mg  daily (AM) Previously tried: beta blockers - bradycardia BP goal: <130/1480mmHg  Family History: Maternal uncle, maternal grandmother and paternal grandmother with history of heart attacks.  Social History: Smokes 1 PPD for the past 40 years. Drinks 6 cans of beer per week, denies illicit drug use.  Diet: 2-3 cups of coffee each day. Has been eating more salads lately. Also likes sandwiches and cereal. Does add salt to his food occasionally.  Exercise: Likes to golf  Home BP readings: Does not check his BP at home  Wt Readings from Last 3 Encounters:  04/13/17 293 lb 6.4 oz (133.1 kg)  04/05/17 280 lb (127 kg)  03/16/17 281 lb 1.6 oz (127.5 kg)   BP Readings from Last 3 Encounters:  04/13/17 (!) 146/82  03/16/17 138/74  03/01/17 (!) 156/79   Pulse Readings from Last 3 Encounters:  04/13/17 72  03/16/17 66  03/01/17 89    Renal function: CrCl cannot be calculated (Patient's most recent lab result is older than the maximum 21 days allowed.).  Past Medical History:  Diagnosis Date  . CAD (coronary artery disease) 09/2014   a. NSTEMI >> LHC 5/16:  mLAD 15, pRCA 90, mRCA 40, RPLB3 80, EF  normal with inf HK >> PCI:  BMS to pRCA and DES to RPLB3   . GERD (gastroesophageal reflux disease)   . History of echocardiogram    Echo 6/16:  EF 50-55%, no RWMA, Gr 1 DD  . HLD (hyperlipidemia)    Hattie Perch/notes 03/15/2017  . HTN (hypertension)   . Hyperglycemia   . NSTEMI (non-ST elevated myocardial infarction) (HCC) 09/2016   Hattie Perch/notes 03/15/2017  . OSA (obstructive sleep apnea)    "dx'd; couldn't tolerate mask" (03/15/2017)  . Tobacco use   . Type II diabetes mellitus (HCC)   . Varicose veins of both lower extremities    S/P ablation 02/2017    Current Outpatient Medications on File Prior to Visit  Medication Sig Dispense Refill  . acetaminophen (TYLENOL) 325 MG tablet Take 650 mg every 6 (six) hours as needed by mouth for mild pain.    Marland Kitchen. atorvastatin (LIPITOR) 40 MG tablet Take 1 tablet (40 mg total) daily by mouth. 90 tablet 3  . clopidogrel (PLAVIX) 75 MG tablet Take 1 tablet (75 mg total) daily by mouth. 90 tablet 3  . hydrochlorothiazide (HYDRODIURIL) 25 MG tablet Take 1 tablet (25 mg total) by mouth daily. 30 tablet 3  .  losartan (COZAAR) 50 MG tablet Take 1 tablet (50 mg total) daily by mouth. 90 tablet 3  . nitroGLYCERIN (NITROSTAT) 0.4 MG SL tablet Place 1 tablet (0.4 mg total) every 5 (five) minutes x 3 doses as needed under the tongue for chest pain. 25 tablet 12   No current facility-administered medications on file prior to visit.     Allergies  Allergen Reactions  . Aspirin Anaphylaxis, Swelling and Rash    Throat closes   . Ibuprofen Anaphylaxis, Swelling and Rash     Assessment/Plan:  1. Hypertension - BP has improved dramatically since starting HCTZ and is now at goal <130/1580mmHg. Will continue HCTZ 25mg  daily and losartan 50mg  daily. Checking BMET today. Encouraged pt to decrease sodium intake in his diet. F/u in HTN clinic as needed.   Megan E. Supple, PharmD, CPP, BCACP [facility] [address], [place], [place] [address] Phone: [phone])  [phone]; Fax: [phone]) [phone] 04/29/2017 2:22 PM

## 2017-04-30 LAB — LIPID PANEL
CHOL/HDL RATIO: 3.3 ratio (ref 0.0–5.0)
Cholesterol, Total: 146 mg/dL (ref 100–199)
HDL: 44 mg/dL (ref >39)
LDL Calculated: 65 mg/dL (ref 0–99)
TRIGLYCERIDES: 184 mg/dL — AB (ref 0–149)
VLDL Cholesterol Cal: 37 mg/dL (ref 5–40)

## 2017-04-30 LAB — HEPATIC FUNCTION PANEL
ALBUMIN: 4.2 g/dL (ref 3.5–5.5)
ALK PHOS: 87 IU/L (ref 39–117)
ALT: 20 IU/L (ref 0–44)
AST: 15 IU/L (ref 0–40)
Bilirubin Total: 0.6 mg/dL (ref 0.0–1.2)
Bilirubin, Direct: 0.22 mg/dL (ref 0.00–0.40)
Total Protein: 6.6 g/dL (ref 6.0–8.5)

## 2017-04-30 LAB — BASIC METABOLIC PANEL
BUN/Creatinine Ratio: 15 (ref 9–20)
BUN: 13 mg/dL (ref 6–24)
CALCIUM: 9.1 mg/dL (ref 8.7–10.2)
CHLORIDE: 100 mmol/L (ref 96–106)
CO2: 22 mmol/L (ref 20–29)
Creatinine, Ser: 0.85 mg/dL (ref 0.76–1.27)
GFR calc non Af Amer: 97 mL/min/{1.73_m2} (ref >59)
GFR, EST AFRICAN AMERICAN: 113 mL/min/{1.73_m2} (ref >59)
Glucose: 175 mg/dL — ABNORMAL HIGH (ref 65–99)
POTASSIUM: 4.2 mmol/L (ref 3.5–5.2)
SODIUM: 140 mmol/L (ref 134–144)

## 2017-05-13 ENCOUNTER — Other Ambulatory Visit: Payer: Self-pay

## 2017-05-14 ENCOUNTER — Ambulatory Visit: Payer: Self-pay

## 2017-05-14 ENCOUNTER — Ambulatory Visit: Payer: Self-pay | Attending: Family Medicine | Admitting: Family Medicine

## 2017-05-14 ENCOUNTER — Encounter: Payer: Self-pay | Admitting: Family Medicine

## 2017-05-14 VITALS — BP 120/82 | HR 75 | Temp 97.6°F | Resp 18 | Ht 76.0 in | Wt 286.0 lb

## 2017-05-14 DIAGNOSIS — I110 Hypertensive heart disease with heart failure: Secondary | ICD-10-CM | POA: Insufficient documentation

## 2017-05-14 DIAGNOSIS — L602 Onychogryphosis: Secondary | ICD-10-CM

## 2017-05-14 DIAGNOSIS — Z23 Encounter for immunization: Secondary | ICD-10-CM | POA: Insufficient documentation

## 2017-05-14 DIAGNOSIS — I83893 Varicose veins of bilateral lower extremities with other complications: Secondary | ICD-10-CM

## 2017-05-14 DIAGNOSIS — I509 Heart failure, unspecified: Secondary | ICD-10-CM | POA: Insufficient documentation

## 2017-05-14 DIAGNOSIS — I10 Essential (primary) hypertension: Secondary | ICD-10-CM

## 2017-05-14 DIAGNOSIS — Z0000 Encounter for general adult medical examination without abnormal findings: Secondary | ICD-10-CM

## 2017-05-14 DIAGNOSIS — Z7902 Long term (current) use of antithrombotics/antiplatelets: Secondary | ICD-10-CM | POA: Insufficient documentation

## 2017-05-14 DIAGNOSIS — I2510 Atherosclerotic heart disease of native coronary artery without angina pectoris: Secondary | ICD-10-CM | POA: Insufficient documentation

## 2017-05-14 DIAGNOSIS — G4730 Sleep apnea, unspecified: Secondary | ICD-10-CM | POA: Insufficient documentation

## 2017-05-14 DIAGNOSIS — I8392 Asymptomatic varicose veins of left lower extremity: Secondary | ICD-10-CM | POA: Insufficient documentation

## 2017-05-14 DIAGNOSIS — Z8669 Personal history of other diseases of the nervous system and sense organs: Secondary | ICD-10-CM

## 2017-05-14 DIAGNOSIS — E119 Type 2 diabetes mellitus without complications: Secondary | ICD-10-CM | POA: Insufficient documentation

## 2017-05-14 DIAGNOSIS — E785 Hyperlipidemia, unspecified: Secondary | ICD-10-CM | POA: Insufficient documentation

## 2017-05-14 DIAGNOSIS — Z79899 Other long term (current) drug therapy: Secondary | ICD-10-CM | POA: Insufficient documentation

## 2017-05-14 LAB — POCT GLYCOSYLATED HEMOGLOBIN (HGB A1C): Hemoglobin A1C: 6.7

## 2017-05-14 LAB — GLUCOSE, POCT (MANUAL RESULT ENTRY): POC GLUCOSE: 119 mg/dL — AB (ref 70–99)

## 2017-05-14 NOTE — Patient Instructions (Signed)

## 2017-05-14 NOTE — Progress Notes (Signed)
Subjective:  Patient ID: William Young, male    DOB: 12/27/1960  Age: 57 y.o. MRN: [id number]  CC: Establish Care   HPI William NipperWilliam Begeman presents to establish care. History of htn, dm, cad, non-stemi,venous insufficiency, and hf.He is not exercising and is not adherent to low salt diet.  at home. Cardiac symptoms none. Patient denies chest pain, chest pressure/discomfort, claudication, dyspnea, near-syncope, palpitations and syncope.  Denies any nitroglycerin use. Cardiovascular risk factors: advanced age (older than 155 for men, 2965 for women), diabetes mellitus, dyslipidemia, hypertension, male gender, obesity (BMI >= 30 kg/m2) and sedentary lifestyle. Use of agents associated with hypertension: none. History of target organ damage: angina/ prior myocardial infarction and heart failure. DM: Symptoms: none. Patient denies foot ulcerations, paresthesia of the feet and visual disturbances.  Evaluation to date has been included: fasting blood sugar, fasting lipid panel and hemoglobin A1C.  Home sugars: patient does not check sugars. Treatment to date: more intensive attention to diet which has been effective and weight loss. He reports losing a significant amount of weight 2 years ago and diabetic medications were d/c. History of sleep apnea. He reports last study was greater than 2 years ago. Associated symptoms include snoring, and periods of apnea. He reports upcoming follow up for sleep study scheduling and has CPAP application with him.   Outpatient Medications Prior to Visit  Medication Sig Dispense Refill  . acetaminophen (TYLENOL) 325 MG tablet Take 650 mg every 6 (six) hours as needed by mouth for mild pain.    Marland Kitchen. atorvastatin (LIPITOR) 40 MG tablet Take 1 tablet (40 mg total) daily by mouth. 90 tablet 3  . clopidogrel (PLAVIX) 75 MG tablet Take 1 tablet (75 mg total) daily by mouth. 90 tablet 3  . hydrochlorothiazide (HYDRODIURIL) 25 MG tablet Take 1 tablet (25 mg total) by mouth daily. 30  tablet 3  . losartan (COZAAR) 50 MG tablet Take 1 tablet (50 mg total) daily by mouth. 90 tablet 3  . nitroGLYCERIN (NITROSTAT) 0.4 MG SL tablet Place 1 tablet (0.4 mg total) every 5 (five) minutes x 3 doses as needed under the tongue for chest pain. 25 tablet 12   No facility-administered medications prior to visit.     ROS Review of Systems  Constitutional: Negative.   Respiratory: Negative.   Cardiovascular: Negative.   Gastrointestinal: Negative.   Skin: Negative.   Psychiatric/Behavioral: Negative.    Objective:  BP 120/82 (BP Location: Right Arm, Patient Position: Sitting, Cuff Size: Large)   Pulse 75   Temp 97.6 F (36.4 C) (Oral)   Resp 18   Ht 6\' 4"  (1.93 m)   Wt 286 lb (129.7 kg)   SpO2 99%   BMI 34.81 kg/m   BP/Weight 05/14/2017 04/29/2017 04/13/2017  Systolic BP 120 114 146  Diastolic BP 82 78 82  Wt. (Lbs) 286 - 293.4  BMI 34.81 - 35.71   Physical Exam  Constitutional: He appears well-developed and well-nourished.  Eyes: Conjunctivae are normal. Pupils are equal, round, and reactive to light.  Neck: Normal range of motion. Neck supple. No JVD present.  Cardiovascular: Normal rate, regular rhythm, normal heart sounds and intact distal pulses.  Pulmonary/Chest: Effort normal and breath sounds normal.  Abdominal: Soft. Bowel sounds are normal. There is no tenderness.  Skin: Skin is warm and dry.  Bilateral varicose veins, mild lower ankle swelling-non pitting  Psychiatric: He has a normal mood and affect.  Nursing note and vitals reviewed.  Diabetic Foot Exam - Simple  Simple Foot Form Diabetic Foot exam was performed with the following findings:  Yes 05/14/2017  9:18 AM  Visual Inspection No deformities, no ulcerations, no other skin breakdown bilaterally:  Yes See comments:  Yes Sensation Testing Intact to touch and monofilament testing bilaterally:  Yes Pulse Check Comments Elongated curved toenails to bilateral feet.     Assessment & Plan:   1.  Essential hypertension Controlled continue current medications.  2. Type 2 diabetes mellitus without complication, without long-term current use of insulin (HCC)  - HgB A1c - Glucose (CBG) - Ambulatory referral to Ophthalmology - Ambulatory referral to Podiatry  3. Varicose veins of both legs with edema Swelling intermittent he reports wearing medical compression stockings, recent increase in HCTZ for HTN/swelling.  4. Coronary artery disease involving native coronary artery of native heart without angina pectoris Controlled Plavix and statin.  5. Hyperlipidemia, unspecified hyperlipidemia type Cont.statin  6. History of sleep apnea Follow up with previous sleep study referral.  7. Healthcare maintenance  - Hepatitis C Antibody  8. Needs flu shot  - Flu Vaccine QUAD 6+ mos PF IM (Fluarix Quad PF)  9. Overgrown toenails  - Ambulatory referral to Podiatry         Follow-up: Return in about 3 months (around 08/12/2017) for HTN/HLD/DM.   Lizbeth Bark FNP

## 2017-05-15 LAB — HEPATITIS C ANTIBODY: Hep C Virus Ab: <0.1 s/co ratio (ref 0.0–0.9)

## 2017-05-17 MED FILL — LOSARTAN POTASSIUM 50 MG TA: 50 | 30 days supply | Qty: 30 | Fill #2

## 2017-05-17 MED FILL — HYDROCHLOROTHIAZIDE 25 MG T: 25 | 30 days supply | Qty: 30 | Fill #1

## 2017-05-17 MED FILL — ?ATORVASTATIN 40MG TABLET: 40 | 30 days supply | Qty: 30 | Fill #2

## 2017-05-18 ENCOUNTER — Telehealth (INDEPENDENT_AMBULATORY_CARE_PROVIDER_SITE_OTHER): Payer: Self-pay | Admitting: *Deleted

## 2017-05-18 NOTE — Telephone Encounter (Signed)
-----   Message from Lizbeth BarkMandesia R Hairston, FNP sent at 05/17/2017  1:41 PM EST ----- Negative hep C

## 2017-05-18 NOTE — Telephone Encounter (Signed)
Medical Assistant left message on patient's home and cell voicemail. Voicemail states to give a call back to [place] [facility] at [phone]. Patient is aware of Hep C being negative.

## 2017-06-08 ENCOUNTER — Ambulatory Visit: Payer: Self-pay | Attending: Family Medicine

## 2017-06-11 MED FILL — ?CLOPIDOGREL 75MG TABLET: 75 | 30 days supply | Qty: 30 | Fill #1

## 2017-06-15 MED FILL — LOSARTAN POTASSIUM 50 MG TA: 50 | 30 days supply | Qty: 30 | Fill #3

## 2017-06-15 MED FILL — HYDROCHLOROTHIAZIDE 25 MG T: 25 | 30 days supply | Qty: 30 | Fill #2

## 2017-06-15 MED FILL — ?ATORVASTATIN 40MG TABLET: 40 | 30 days supply | Qty: 30 | Fill #3

## 2017-07-15 ENCOUNTER — Ambulatory Visit (INDEPENDENT_AMBULATORY_CARE_PROVIDER_SITE_OTHER): Payer: Self-pay | Admitting: Internal Medicine

## 2017-07-15 ENCOUNTER — Encounter (INDEPENDENT_AMBULATORY_CARE_PROVIDER_SITE_OTHER): Payer: Self-pay

## 2017-07-15 ENCOUNTER — Encounter: Payer: Self-pay | Admitting: Internal Medicine

## 2017-07-15 VITALS — BP 130/76 | HR 70 | Ht 76.0 in | Wt 288.6 lb

## 2017-07-15 DIAGNOSIS — E782 Mixed hyperlipidemia: Secondary | ICD-10-CM

## 2017-07-15 DIAGNOSIS — G4730 Sleep apnea, unspecified: Secondary | ICD-10-CM

## 2017-07-15 DIAGNOSIS — I2510 Atherosclerotic heart disease of native coronary artery without angina pectoris: Secondary | ICD-10-CM

## 2017-07-15 DIAGNOSIS — I10 Essential (primary) hypertension: Secondary | ICD-10-CM

## 2017-07-15 DIAGNOSIS — I5032 Chronic diastolic (congestive) heart failure: Secondary | ICD-10-CM

## 2017-07-15 MED FILL — HYDROCHLOROTHIAZIDE 25 MG T: 25 | 30 days supply | Qty: 30 | Fill #3

## 2017-07-15 MED FILL — LOSARTAN POTASSIUM 50 MG TA: 50 | 30 days supply | Qty: 30 | Fill #4

## 2017-07-15 MED FILL — ATORVASTATIN 40 MG TABLET: 40 | 30 days supply | Qty: 30 | Fill #4

## 2017-07-15 MED FILL — CLOPIDOGREL 75 MG TABLET: 75 | 30 days supply | Qty: 30 | Fill #2

## 2017-07-15 NOTE — Progress Notes (Signed)
Cardiology Office Note   Date:  07/15/2017   ID:  William NipperWilliam [name], DOB 07/20/1960, MRN [id number]  PCP:  William [name], William [name], William [name]  Cardiologist:   William PatesPaula Riko [name], William [name]       History of Present Illness: William NipperWilliam [name] is a 57 y.o. male with a history of CAD, DM, GERD, HTN, HL In 09/2014 he had a NSTEMI  LHC done which showed single vessel CAD with 90% prox RCA and 80^ [name].  Underwent PCI/BMS to prox RCA and resolute DES to RPLB3.  Hx of ASA allergy so treated  With Brilinta for 1 year then continued on plavix.    Pt admitted in Nov 2018 with CP  Had run out of meds   All resumed  [name] for MI Myovue post discharge showed no ischemia   LVEF 38^  Echo showed LVEF 55 to 60^ with inferolateral hypokinesis  The pt was sen in clinic by William [name] in December  Meds adjust with additon of HCTX He was seen in HTN clininc and BP was good     Since seen he has been doing good  He denies CP  Breathing is OK   Occasional edema if stands too much (works at [employer]) MetLifedmits tom smoking       Current Meds  Medication Sig  . acetaminophen (TYLENOL) 325 MG tablet Take 650 mg every 6 (six) hours as needed by mouth for mild pain.  Marland Kitchen. atorvastatin (LIPITOR) 40 MG tablet Take 1 tablet (40 mg total) daily by mouth.  . clopidogrel (PLAVIX) 75 MG tablet Take 1 tablet (75 mg total) daily by mouth.  . hydrochlorothiazide (HYDRODIURIL) 25 MG tablet Take 1 tablet (25 mg total) by mouth daily.  Marland Kitchen. losartan (COZAAR) 50 MG tablet Take 1 tablet (50 mg total) daily by mouth.  . nitroGLYCERIN (NITROSTAT) 0.4 MG SL tablet Place 1 tablet (0.4 mg total) every 5 (five) minutes x 3 doses as needed under the tongue for chest pain.     Allergies:   Aspirin and Ibuprofen   Past Medical History:  Diagnosis Date  . CAD (coronary artery disease) 09/2014   a. NSTEMI >> LHC 5/16:  mLAD 15, pRCA 90, mRCA 40, RPLB3 80, EF normal with inf HK >> PCI:  BMS to pRCA and DES to RPLB3   . GERD (gastroesophageal reflux disease)   .  History of echocardiogram    Echo 6/16:  EF 50-55%, no RWMA, Gr 1 DD  . HLD (hyperlipidemia)    William [name]/notes 03/15/2017  . HTN (hypertension)   . Hyperglycemia   . NSTEMI (non-ST elevated myocardial infarction) (HCC) 09/2016   William [name]/notes 03/15/2017  . OSA (obstructive sleep apnea)    "dx'd; couldn't tolerate mask" (03/15/2017)  . Tobacco use   . Type II diabetes mellitus (HCC)   . Varicose veins of both lower extremities    S/P ablation 02/2017    Past Surgical History:  Procedure Laterality Date  . CARDIAC CATHETERIZATION N/A 09/11/2014   Procedure: Left Heart Cath and Coronary Angiography;  Surgeon: Marykay Lexavid W [name], William [name];  [facility];  Service: Cardiovascular;  Laterality: N/A;  . CARDIAC CATHETERIZATION  09/11/2014   Procedure: Coronary Stent Intervention;  Surgeon: Marykay Lexavid W [name], William [name];  [facility];  Service: Cardiovascular;;  . ENDOVENOUS ABLATION SAPHENOUS VEIN W/ LASER Right 02/22/2017   endovenous laser ablation [name] by William [name] William [name]  Social History:  The patient  reports that he has been smoking cigarettes.  He has a 40.00 pack-year smoking history. He has quit using smokeless tobacco. His smokeless tobacco use included chew. He reports that he drinks about 3.6 oz of alcohol per week. He reports that he does not use drugs.   Family History:  The patient's family history includes Cancer in his maternal grandfather; Heart attack in his maternal grandmother, maternal uncle, and paternal grandmother.    ROS:  Please see the history of present illness. All other systems are reviewed and  Negative to the above problem except as noted.    PHYSICAL EXAM: VS:  BP 130/76   Pulse 70   Ht 6\' 4"  (1.93 m)   Wt 288 lb 9.6 oz (130.9 kg)   SpO2 99%   BMI 35.13 kg/m   GEN: Well nourished, well developed, in no acute distress  HEENT: normal  Neck:  JVP normal  , carotid bruits, or masses Cardiac: RRR; no  murmurs, rubs, or gallops,Triv edema  Respiratory:  clear to auscultation bilaterally, normal work of breathing GI: soft, nontender, nondistended, + BS  No hepatomegaly  MS: no deformity Moving all extremities   Skin: warm and dry, no rash Neuro:  Strength and sensation are intact Psych: euthymic mood, full affect   EKG:  EKG is not ordered today.   Lipid Panel    Component Value Date/Time   CHOL 146 04/29/2017 1402   TRIG 184 (H) 04/29/2017 1402   HDL 44 04/29/2017 1402   CHOLHDL 3.3 04/29/2017 1402   CHOLHDL 6.8 03/16/2017 0439   VLDL 33 03/16/2017 0439   LDLCALC 65 04/29/2017 1402      Wt Readings from Last 3 Encounters:  07/15/17 288 lb 9.6 oz (130.9 kg)  05/14/17 286 lb (129.7 kg)  04/13/17 293 lb 6.4 oz (133.1 kg)      ASSESSMENT AND PLAN:  1  CAD   No symptoms of angina  NEg stress test  2  HTN  BP is good  Continue current regimen  3  HL   Lipids in Dec LDL 65 HDL 44  Continue meds   4  Hx mild diastolic CHF  Current volume status looks OK  5   Hx sleep apnea  Need to clarify if using  6  Hx tob abuse  COntinues to smoke about 1/2 ppd   Counselled on cessation    F/U in September   Current medicines are reviewed at length with the patient today.  The patient does not have concerns regarding medicines.  Signed, William [name], William [name]  07/15/2017 11:18 AM    [facility] [address] [place], [place], [place]  [address] Phone: [phone]; Fax: [phone]

## 2017-07-15 NOTE — Patient Instructions (Signed)
Your physician recommends that you continue on your current medications as directed. Please refer to the Current Medication list given to you today. Your physician wants you to follow-up in: September, 2019 with Dr. Ross.  You will receive a reminder letter in the mail two months in advance. If you don't receive a letter, please call our office to schedule the follow-up appointment.  

## 2017-07-16 NOTE — Telephone Encounter (Signed)
Sleep study  Deidre AlaHiggins, Amy C  Arien Benincasa G, CMA        Insurance info shows pt has applied [facility] [employer]. He does not have benefits yet. Will have to check it again in January to see if he has coverage then.  Thanks,  Amy

## 2017-07-20 ENCOUNTER — Telehealth: Payer: Self-pay | Admitting: *Deleted

## 2017-07-20 NOTE — Telephone Encounter (Signed)
Called patient to inquire if he was able to get new medicaid card no answer LMTCB.

## 2017-07-20 NOTE — Telephone Encounter (Signed)
-----   Message from Haywood FillerAmy C Higgins sent at 07/19/2017 12:00 PM EDT ----- Regarding: RE: Sleep study He does not have coverage with Medicaid. ----- Message ----- From: Reesa ChewJones, Hisham Provence G, CMA Sent: 07/16/2017   2:52 PM To: Amy Pamella Pert Higgins Subject: RE: Sleep study                                Should we recheck this patient out? Or not? ----- Message ----- From: Haywood FillerHiggins, Amy C Sent: 04/28/2017  12:55 PM To: Reesa Cheworothea G Kolbey Teichert, CMA Subject: Sleep study                                    Insurance info shows pt has applied for medicaid. He does not have benefits yet. Will have to check it again in January to see if he has coverage then. Thanks, Amy

## 2017-08-03 ENCOUNTER — Telehealth: Payer: Self-pay | Admitting: *Deleted

## 2017-08-03 NOTE — Telephone Encounter (Signed)
LMTCB about insurance and to confirm if he has other insurance. If patient does not have other insurance he will be self pay.

## 2017-08-03 NOTE — Telephone Encounter (Signed)
-----   Message from Amy C Higgins sent at 07/19/2017 12:00 PM EDT ----- Regarding: RE: Sleep study He does not have coverage with Medicaid. ----- Message ----- From: Marquan Vokes G, CMA Sent: 07/16/2017   2:52 PM To: Amy C Higgins Subject: RE: Sleep study                                Should we recheck this patient out? Or not? ----- Message ----- From: Higgins, Amy C Sent: 04/28/2017  12:55 PM To: Cathlin Buchan G Graig Hessling, CMA Subject: Sleep study                                    Insurance info shows pt has applied for medicaid. He does not have benefits yet. Will have to check it again in January to see if he has coverage then. Thanks, Amy   

## 2017-08-12 ENCOUNTER — Ambulatory Visit: Payer: Self-pay | Admitting: Family Medicine

## 2017-08-13 ENCOUNTER — Ambulatory Visit: Payer: Self-pay | Attending: Family Medicine | Admitting: Nurse Practitioner

## 2017-08-13 ENCOUNTER — Encounter: Payer: Self-pay | Admitting: Nurse Practitioner

## 2017-08-13 VITALS — BP 139/76 | HR 62 | Temp 98.2°F | Ht 76.0 in | Wt 284.4 lb

## 2017-08-13 DIAGNOSIS — Z955 Presence of coronary angioplasty implant and graft: Secondary | ICD-10-CM | POA: Insufficient documentation

## 2017-08-13 DIAGNOSIS — I252 Old myocardial infarction: Secondary | ICD-10-CM | POA: Insufficient documentation

## 2017-08-13 DIAGNOSIS — Z79899 Other long term (current) drug therapy: Secondary | ICD-10-CM | POA: Insufficient documentation

## 2017-08-13 DIAGNOSIS — E782 Mixed hyperlipidemia: Secondary | ICD-10-CM

## 2017-08-13 DIAGNOSIS — I10 Essential (primary) hypertension: Secondary | ICD-10-CM

## 2017-08-13 DIAGNOSIS — E781 Pure hyperglyceridemia: Secondary | ICD-10-CM | POA: Insufficient documentation

## 2017-08-13 DIAGNOSIS — K219 Gastro-esophageal reflux disease without esophagitis: Secondary | ICD-10-CM | POA: Insufficient documentation

## 2017-08-13 DIAGNOSIS — Z720 Tobacco use: Secondary | ICD-10-CM

## 2017-08-13 DIAGNOSIS — D696 Thrombocytopenia, unspecified: Secondary | ICD-10-CM

## 2017-08-13 DIAGNOSIS — E119 Type 2 diabetes mellitus without complications: Secondary | ICD-10-CM

## 2017-08-13 DIAGNOSIS — Z6834 Body mass index (BMI) 34.0-34.9, adult: Secondary | ICD-10-CM | POA: Insufficient documentation

## 2017-08-13 DIAGNOSIS — I2510 Atherosclerotic heart disease of native coronary artery without angina pectoris: Secondary | ICD-10-CM | POA: Insufficient documentation

## 2017-08-13 DIAGNOSIS — G4733 Obstructive sleep apnea (adult) (pediatric): Secondary | ICD-10-CM | POA: Insufficient documentation

## 2017-08-13 DIAGNOSIS — Z87891 Personal history of nicotine dependence: Secondary | ICD-10-CM | POA: Insufficient documentation

## 2017-08-13 LAB — GLUCOSE, POCT (MANUAL RESULT ENTRY): POC Glucose: 131 mg/dl — AB (ref 70–99)

## 2017-08-13 NOTE — Progress Notes (Signed)
Assessment & Plan:  William Young was seen today for establish care.  Diagnoses and all orders for this visit:  Type 2 diabetes mellitus without complication, without long-term current use of insulin (HCC) -     Glucose (CBG) Continue blood sugar control as discussed in office today, low carbohydrate diet, and regular physical exercise as tolerated, 150 minutes per week (30 min each day, 5 days per week, or 50 min 3 days per week). Annual eye exams and foot exams are recommended.   Morbid obesity (HCC) Resolved. Currently Obese with BMI 34.62 and co morbidities Discussed diet and exercise for person with BMI >25. Instructed: You must burn more calories than you eat. Losing 5 percent of your body weight should be considered a success. In the longer term, losing more than 15 percent of your body weight and staying at this weight is an extremely good result. However, keep in mind that even losing 5 percent of your body weight leads to important health benefits, so try not to get discouraged if you're not able to lose more than this. Will recheck weight in 3-6 months.  Thrombocytopenia (HCC) -     CBC STABLE Lab Results  Component Value Date   WBC 5.1 03/16/2017   HGB 13.1 03/16/2017   HCT 39.4 03/16/2017   MCV 92.7 03/16/2017   PLT 153 03/16/2017   Essential hypertension Continue all antihypertensives as prescribed.  Remember to bring in your blood pressure log with you for your follow up appointment.  DASH/Mediterranean Diets are healthier choices for HTN.    Tobacco use William Young was counseled on the dangers of tobacco use, and was advised to quit. Reviewed strategies to maximize success, including removing cigarettes and smoking materials from environment, stress management and support of family/friends as well as pharmacological alternatives including: Wellbutrin, Chantix, Nicotine patch, Nicotine gum or lozenges. Smoking cessation support: smoking cessation hotline: 1-800-QUIT-NOW.   Smoking cessation classes are also available through [facility]. Call [phone] or visit our website at [email or web address].    A total of 3 minutes was spent on counseling for smoking cessation and William Young is not ready to quit. He has quit smoking in the past. Duration: 3 months after his MI. He resumed smoking due to stress.   Mixed hyperlipidemia -     Lipid panel INSTRUCTIONS: Work on a low fat, heart healthy diet and participate in regular aerobic exercise program by working out at least 150 minutes per week. No fried foods. No junk foods, sodas, sugary drinks, unhealthy snacking, alcohol or smoking.    Patient has been counseled on age-appropriate routine health concerns for screening and prevention. These are reviewed and up-to-date. Referrals have been placed accordingly. Immunizations are up-to-date or declined.    Subjective:   Chief Complaint  Patient presents with  . Establish Care    Pt is here to establish care for diabetes, hypertension, and cholesterol.    HPI William Young 57 y.o. male presents to office today to establish care. He has a history of NSTEMI with stent, HTN, HPL, HF (Sees cardiology) and DM.   DM TYPE 2 Chronic. Well controlled. He does not check his blood sugars at home. Denies any hypo or hyperglycemia symptoms, neuropathy or visual disturbances. Overdue for eye exam. Diabetes is diet controlled. He was previously on Metformin however it was discontinued due to weight loss and glucose control.  Lab Results  Component Value Date   HGBA1C 6.7 05/14/2017   Essential Hypertension Chronic. Stable.  Medication compliant with losartan 50mg  and HCTZ 25mg  daily. Denies chest pain, shortness of breath, palpitations, lightheadedness, dizziness, headaches or BLE edema.  BP Readings from Last 3 Encounters:  08/13/17 139/76  07/15/17 130/76  05/14/17 120/82    Hyperlipidemia Patient presents for follow up to hyperlipidemia.  He is  medication compliant. He tries to be diet compliant and denies chest pain, dyspnea, exertional chest pressure/discomfort, lower extremity edema, palpitations, poor exercise tolerance and tachypnea or statin intolerance including myalgias.  Lab Results  Component Value Date   CHOL 146 04/29/2017   Lab Results  Component Value Date   HDL 44 04/29/2017   Lab Results  Component Value Date   LDLCALC 65 04/29/2017   Lab Results  Component Value Date   TRIG 184 (H) 04/29/2017   Lab Results  Component Value Date   CHOLHDL 3.3 04/29/2017    Review of Systems  Constitutional: Negative for fever, malaise/fatigue and weight loss.  HENT: Negative.  Negative for nosebleeds.   Eyes: Negative.  Negative for blurred vision, double vision and photophobia.  Respiratory: Negative.  Negative for cough and shortness of breath.   Cardiovascular: Negative.  Negative for chest pain, palpitations and leg swelling.  Gastrointestinal: Negative.  Negative for heartburn, nausea and vomiting.  Musculoskeletal: Negative.  Negative for myalgias.  Neurological: Negative.  Negative for dizziness, focal weakness, seizures and headaches.  Psychiatric/Behavioral: Negative.  Negative for suicidal ideas.    Past Medical History:  Diagnosis Date  . CAD (coronary artery disease) 09/2014   a. NSTEMI >> LHC 5/16:  mLAD 15, pRCA 90, mRCA 40, RPLB3 80, EF normal with inf HK >> PCI:  BMS to pRCA and DES to RPLB3   . GERD (gastroesophageal reflux disease)   . History of echocardiogram    Echo 6/16:  EF 50-55%, no RWMA, Gr 1 DD  . HLD (hyperlipidemia)    Hattie Perch 03/15/2017  . HTN (hypertension)   . Hyperglycemia   . NSTEMI (non-ST elevated myocardial infarction) (HCC) 09/2016   Hattie Perch 03/15/2017  . OSA (obstructive sleep apnea)    "dx'd; couldn't tolerate mask" (03/15/2017)  . Tobacco use   . Type II diabetes mellitus (HCC)   . Varicose veins of both lower extremities    S/P ablation 02/2017    Past Surgical  History:  Procedure Laterality Date  . CARDIAC CATHETERIZATION N/A 09/11/2014   Procedure: Left Heart Cath and Coronary Angiography;  Surgeon: Marykay Lex, MD;  Location: [facility] INVASIVE CV LAB CUPID;  Service: Cardiovascular;  Laterality: N/A;  . CARDIAC CATHETERIZATION  09/11/2014   Procedure: Coronary Stent Intervention;  Surgeon: Marykay Lex, MD;  Location: [facility] INVASIVE CV LAB CUPID;  Service: Cardiovascular;;  . ENDOVENOUS ABLATION SAPHENOUS VEIN W/ LASER Right 02/22/2017   endovenous laser ablation R GSV and stab phlebectomy >20 incisions R leg by Josephina Gip MD     Family History  Problem Relation Age of Onset  . Heart attack Maternal Uncle   . Heart attack Paternal Grandmother   . Cancer Maternal Grandfather   . Heart attack Maternal Grandmother   . Stroke Neg Hx   . Hypertension Neg Hx     Social History Reviewed with no changes to be made today.   Outpatient Medications Prior to Visit  Medication Sig Dispense Refill  . acetaminophen (TYLENOL) 325 MG tablet Take 650 mg every 6 (six) hours as needed by mouth for mild pain.    Marland Kitchen atorvastatin (LIPITOR) 40 MG tablet Take 1 tablet (40  mg total) daily by mouth. 90 tablet 3  . clopidogrel (PLAVIX) 75 MG tablet Take 1 tablet (75 mg total) daily by mouth. 90 tablet 3  . losartan (COZAAR) 50 MG tablet Take 1 tablet (50 mg total) daily by mouth. 90 tablet 3  . nitroGLYCERIN (NITROSTAT) 0.4 MG SL tablet Place 1 tablet (0.4 mg total) every 5 (five) minutes x 3 doses as needed under the tongue for chest pain. 25 tablet 12  . hydrochlorothiazide (HYDRODIURIL) 25 MG tablet Take 1 tablet (25 mg total) by mouth daily. 30 tablet 3   No facility-administered medications prior to visit.     Allergies  Allergen Reactions  . Aspirin Anaphylaxis, Swelling and Rash    Throat closes   . Ibuprofen Anaphylaxis, Swelling and Rash       Objective:    BP 139/76 (BP Location: Right Arm, Patient Position: Sitting, Cuff Size: Large)   Pulse 62    Temp 98.2 F (36.8 C) (Oral)   Ht 6\' 4"  (1.93 m)   Wt 284 lb 6.4 oz (129 kg)   SpO2 96%   BMI 34.62 kg/m  Wt Readings from Last 3 Encounters:  08/13/17 284 lb 6.4 oz (129 kg)  07/15/17 288 lb 9.6 oz (130.9 kg)  05/14/17 286 lb (129.7 kg)    Physical Exam  Constitutional: He is oriented to person, place, and time. He appears well-developed and well-nourished. He is cooperative.  HENT:  Head: Normocephalic and atraumatic.  Eyes: EOM are normal.  Neck: Normal range of motion.  Cardiovascular: Normal rate, regular rhythm, normal heart sounds and intact distal pulses. Exam reveals no gallop and no friction rub.  No murmur heard. Pulmonary/Chest: Effort normal and breath sounds normal. No tachypnea. No respiratory distress. He has no decreased breath sounds. He has no wheezes. He has no rhonchi. He has no rales. He exhibits no tenderness.  Abdominal: Soft. Bowel sounds are normal.  Musculoskeletal: Normal range of motion. He exhibits no edema.  Neurological: He is alert and oriented to person, place, and time. Coordination normal.  Skin: Skin is warm and dry.  Psychiatric: He has a normal mood and affect. His behavior is normal. Judgment and thought content normal.  Nursing note and vitals reviewed.      Patient has been counseled extensively about nutrition and exercise as well as the importance of adherence with medications and regular follow-up. The patient was given clear instructions to go to ER or return to medical center if symptoms don't improve, worsen or new problems develop. The patient verbalized understanding.   Follow-up: Return in about 3 months (around 11/12/2017) for HTN/HPL/DM.   Claiborne Rigg, FNP-BC [facility] [place], [place] [phone]   08/13/2017, 9:12 AM

## 2017-08-13 NOTE — Patient Instructions (Signed)
Health Risks of Smoking Smoking cigarettes is very bad for your health. Tobacco smoke has over 200 known poisons in it. It contains the poisonous gases nitrogen oxide and carbon monoxide. There are over 60 chemicals in tobacco smoke that cause cancer. Smoking is difficult to quit because a chemical in tobacco, called nicotine, causes addiction or dependence. When you smoke and inhale, nicotine is absorbed rapidly into the bloodstream through your lungs. Both inhaled and non-inhaled nicotine may be addictive. What are the risks of cigarette smoke? Cigarette smokers have an increased risk of many serious medical problems, including:  Lung cancer.  Lung disease, such as pneumonia, bronchitis, and emphysema.  Chest pain (angina) and heart attack because the heart is not getting enough oxygen.  Heart disease and peripheral blood vessel disease.  High blood pressure (hypertension).  Stroke.  Oral cancer, including cancer of the lip, mouth, or voice box.  Bladder cancer.  Pancreatic cancer.  Cervical cancer.  Pregnancy complications, including premature birth.  Stillbirths and smaller newborn babies, birth defects, and genetic damage to sperm.  Early menopause.  Lower estrogen level for women.  Infertility.  Facial wrinkles.  Blindness.  Increased risk of broken bones (fractures).  Senile dementia.  Stomach ulcers and internal bleeding.  Delayed wound healing and increased risk of complications during surgery.  Even smoking lightly shortens your life expectancy by several years.  Because of secondhand smoke exposure, children of smokers have an increased risk of the following:  Sudden infant death syndrome (SIDS).  Respiratory infections.  Lung cancer.  Heart disease.  Ear infections.  What are the benefits of quitting? There are many health benefits of quitting smoking. Here are some of them:  Within days of quitting smoking, your risk of having a heart  attack decreases, your blood flow improves, and your lung capacity improves. Blood pressure, pulse rate, and breathing patterns start returning to normal soon after quitting.  Within months, your lungs may clear up completely.  Quitting for 10 years reduces your risk of developing lung cancer and heart disease to almost that of a nonsmoker.  People who quit may see an improvement in their overall quality of life.  How do I quit smoking? Smoking is an addiction with both physical and psychological effects, and longtime habits can be hard to change. Your health care provider can recommend:  Programs and community resources, which may include group support, education, or talk therapy.  Prescription medicines to help reduce cravings.  Nicotine replacement products, such as patches, gum, and nasal sprays. Use these products only as directed. Do not replace cigarette smoking with electronic cigarettes, which are commonly called e-cigarettes. The safety of e-cigarettes is not known, and some may contain harmful chemicals.  A combination of two or more of these methods.  Where to find more information:  American Lung Association: www.lung.org  American Cancer Society: www.cancer.org Summary  Smoking cigarettes is very bad for your health. Cigarette smokers have an increased risk of many serious medical problems, including several cancers, heart disease, and stroke.  Smoking is an addiction with both physical and psychological effects, and longtime habits can be hard to change.  By stopping right away, you can greatly reduce the risk of medical problems for you and your family.  To help you quit smoking, your health care provider can recommend programs, community resources, prescription medicines, and nicotine replacement products such as patches, gum, and nasal sprays. This information is not intended to replace advice given to you by your health   care provider. Make sure you discuss any  questions you have with your health care provider. Document Released: 06/04/2004 Document Revised: 05/01/2016 Document Reviewed: 05/01/2016 Elsevier Interactive Patient Education  2017 Elsevier Inc.  Steps to Quit Smoking Smoking tobacco can be bad for your health. It can also affect almost every organ in your body. Smoking puts you and people around you at risk for many serious long-lasting (chronic) diseases. Quitting smoking is hard, but it is one of the best things that you can do for your health. It is never too late to quit. What are the benefits of quitting smoking? When you quit smoking, you lower your risk for getting serious diseases and conditions. They can include:  Lung cancer or lung disease.  Heart disease.  Stroke.  Heart attack.  Not being able to have children (infertility).  Weak bones (osteoporosis) and broken bones (fractures).  If you have coughing, wheezing, and shortness of breath, those symptoms may get better when you quit. You may also get sick less often. If you are pregnant, quitting smoking can help to lower your chances of having a baby of low birth weight. What can I do to help me quit smoking? Talk with your doctor about what can help you quit smoking. Some things you can do (strategies) include:  Quitting smoking totally, instead of slowly cutting back how much you smoke over a period of time.  Going to in-person counseling. You are more likely to quit if you go to many counseling sessions.  Using resources and support systems, such as: ? Online chats with a counselor. ? Phone quitlines. ? Printed self-help materials. ? Support groups or group counseling. ? Text messaging programs. ? Mobile phone apps or applications.  Taking medicines. Some of these medicines may have nicotine in them. If you are pregnant or breastfeeding, do not take any medicines to quit smoking unless your doctor says it is okay. Talk with your doctor about counseling or  other things that can help you.  Talk with your doctor about using more than one strategy at the same time, such as taking medicines while you are also going to in-person counseling. This can help make quitting easier. What things can I do to make it easier to quit? Quitting smoking might feel very hard at first, but there is a lot that you can do to make it easier. Take these steps:  Talk to your family and friends. Ask them to support and encourage you.  Call phone quitlines, reach out to support groups, or work with a counselor.  Ask people who smoke to not smoke around you.  Avoid places that make you want (trigger) to smoke, such as: ? Bars. ? Parties. ? Smoke-break areas at work.  Spend time with people who do not smoke.  Lower the stress in your life. Stress can make you want to smoke. Try these things to help your stress: ? Getting regular exercise. ? Deep-breathing exercises. ? Yoga. ? Meditating. ? Doing a body scan. To do this, close your eyes, focus on one area of your body at a time from head to toe, and notice which parts of your body are tense. Try to relax the muscles in those areas.  Download or buy apps on your mobile phone or tablet that can help you stick to your quit plan. There are many free apps, such as QuitGuide from the CDC (Centers for Disease Control and Prevention). You can find more support from smokefree.gov and other websites.    This information is not intended to replace advice given to you by your health care provider. Make sure you discuss any questions you have with your health care provider. Document Released: 02/21/2009 Document Revised: 12/24/2015 Document Reviewed: 09/11/2014 Elsevier Interactive Patient Education  2018 Elsevier Inc.  

## 2017-08-14 LAB — CBC
HEMATOCRIT: 41.1 % (ref 37.5–51.0)
HEMOGLOBIN: 13.6 g/dL (ref 13.0–17.7)
MCH: 31.1 pg (ref 26.6–33.0)
MCHC: 33.1 g/dL (ref 31.5–35.7)
MCV: 94 fL (ref 79–97)
PLATELETS: 182 10*3/uL (ref 150–379)
RBC: 4.38 x10E6/uL (ref 4.14–5.80)
RDW: 14.5 % (ref 12.3–15.4)
WBC: 6.7 10*3/uL (ref 3.4–10.8)

## 2017-08-14 LAB — LIPID PANEL
CHOL/HDL RATIO: 4.4 ratio (ref 0.0–5.0)
Cholesterol, Total: 150 mg/dL (ref 100–199)
HDL: 34 mg/dL — AB (ref >39)
LDL CALC: 96 mg/dL (ref 0–99)
Triglycerides: 100 mg/dL (ref 0–149)
VLDL CHOLESTEROL CAL: 20 mg/dL (ref 5–40)

## 2017-08-17 ENCOUNTER — Telehealth: Payer: Self-pay

## 2017-08-17 NOTE — Telephone Encounter (Signed)
CMA attempt to call patient to inform on lab results.  No answer and left a VM for patient to call back.  If patient call back, please inform:  Please inform patient that laboratory results are normal. Continue healthy eating habit and regular physical exercise at least 3 times a week, 50 minutes each time.

## 2017-08-17 NOTE — Telephone Encounter (Signed)
-----   Message from Claiborne RiggZelda W Fleming, NP sent at 08/16/2017  6:17 PM EDT ----- Please inform patient that laboratory results are normal. Continue healthy eating habit and regular physical exercise at least 3 times a week, 50 minutes each time.

## 2017-08-19 ENCOUNTER — Other Ambulatory Visit: Payer: Self-pay | Admitting: Physician Assistant

## 2017-08-19 MED FILL — CLOPIDOGREL 75 MG TABLET: 75 | 30 days supply | Qty: 30 | Fill #3

## 2017-08-19 MED FILL — ATORVASTATIN CALCIUM 40 MG: 40 | 30 days supply | Qty: 30 | Fill #5

## 2017-08-19 MED FILL — HYDROCHLOROTHIAZIDE 25 MG T: 25 | 30 days supply | Qty: 30 | Fill #0

## 2017-08-19 MED FILL — LOSARTAN POTASSIUM 50 MG TA: 50 | 30 days supply | Qty: 30 | Fill #5

## 2017-11-05 MED FILL — HYDROCHLOROTHIAZIDE 25 MG T: 25 | 30 days supply | Qty: 30 | Fill #1

## 2017-11-05 MED FILL — LOSARTAN POTASSIUM 50 MG TA: 50 | 30 days supply | Qty: 30 | Fill #6

## 2017-11-05 MED FILL — ATORVASTATIN CALCIUM 40 MG: 40 | 30 days supply | Qty: 30 | Fill #6

## 2017-11-05 MED FILL — CLOPIDOGREL 75 MG TABLET: 75 | 30 days supply | Qty: 30 | Fill #4

## 2017-11-12 ENCOUNTER — Ambulatory Visit: Payer: Self-pay | Admitting: Nurse Practitioner

## 2017-12-09 MED FILL — CLOPIDOGREL 75 MG TABLET: 75 | 30 days supply | Qty: 30 | Fill #5

## 2017-12-09 MED FILL — ATORVASTATIN CALCIUM 40 MG: 40 | 30 days supply | Qty: 30 | Fill #7

## 2017-12-09 MED FILL — HYDROCHLOROTHIAZIDE 25 MG T: 25 | 30 days supply | Qty: 30 | Fill #2

## 2017-12-09 MED FILL — LOSARTAN POTASSIUM 50 MG TA: 50 | 30 days supply | Qty: 30 | Fill #7

## 2018-05-26 ENCOUNTER — Encounter: Payer: Self-pay | Admitting: Physician Assistant

## 2018-05-26 NOTE — Progress Notes (Signed)
Cardiology Office Note:    Date:  05/27/2018   ID:  William NipperWilliam Young, DOB 08/05/1960, MRN [id number]   PCP:  Claiborne RiggFleming, Zelda W, NP  Cardiologist:  Dietrich PatesPaula Ross, MD   Electrophysiologist:  None   Referring MD: Claiborne RiggFleming, Zelda W, NP   Chief Complaint  Patient presents with  . Follow-up    CAD     History of Present Illness:    William Young is a 58 y.o. male with coronary artery disease, s/p NSTEMI in 5/16 treated with BMS to the proximal RCA and DES to the RPLB3, allergy to ASA, diabetes, hypertension, hyperlipidemia, tobacco abuse, obstructive sleep apnea.  Myoview in 11/18 demonstrated no ischemia.  Echo in 11/18 demonstrated normal EF with inf-lat HK.  Last seen by Dr. Tenny Crawoss in 3/19.     William Young returns for follow up.  He is here with his girlfriend.  He ran out of his medications 3 mos ago.  He has not had any chest pain, shortness of breath, syncope, paroxysmal nocturnal dyspnea, syncope.  He has a hx of varicose veins and underwent laser ablation of the R great saphenous vein and multiple stab phlebectomy of painful varicosities by Dr. Hart RochesterLawson in 2018.  He continues to have issues with swelling and pain in his R leg after prolonged standing.    Prior CV studies:   The following studies were reviewed today:   Echo 04/09/17 Mild LVH, EF 55-60, inf-lat HK, Gr 1 DD, mildly dilated asc aorta  Nuclear stress test 04/06/17 Medium size, moderate severity fixed inferolateral perfusion defect, likely attenuation artifact. No reversible ischemia. Moderately dilated LV with severe global hypokinesis. LVEF 38%. This is an intermediate risk study.  Echo 10/17/14 EF 50-55, no RWMA, Gr 1 DD, trivial TR  LHC/PCI 09/11/14 LM: ok LAD:  Mid 15% LCx:  Ok RCA:  prox 90%, mid 40%, RPLB3 80% LVEF normal PCI:  MultiLink Vision BMS 5.0 mm x 18 mm (5.2 mm) to pRCA;  Resolute DES 2.5 mm x 14 mm (2.8 mm) to RPLB3   Past Medical History:  Diagnosis Date  . CAD (coronary artery disease) 09/2014   a.  NSTEMI >> LHC 5/16:  mLAD 15, pRCA 90, mRCA 40, RPLB3 80, EF normal with inf HK >> PCI:  BMS to pRCA and DES to RPLB3   . GERD (gastroesophageal reflux disease)   . History of echocardiogram    Echo 6/16:  EF 50-55%, no RWMA, Gr 1 DD  . HLD (hyperlipidemia)    Hattie Perch/notes 03/15/2017  . HTN (hypertension)   . Hx of [facility]I [id number]  . Hyperglycemia   . OSA (obstructive sleep apnea)    "dx'd; couldn't tolerate mask" (03/15/2017)  . Tobacco use   . Type II diabetes mellitus (HCC)   . Varicose veins of both lower extremities    S/P ablation 02/2017   Surgical Hx: The patient  has a past surgical history that includes Endovenous ablation saphenous vein w/ laser (Right, 02/22/2017); Cardiac catheterization (N/A, 09/11/2014); and Cardiac catheterization (09/11/2014).   Current Medications: Current Meds  Medication Sig  . acetaminophen (TYLENOL) 325 MG tablet Take 650 mg every 6 (six) hours as needed by mouth for mild pain.     Allergies:   Aspirin and Ibuprofen   Social History   Tobacco Use  . Smoking status: Current Every Day Smoker    Packs/day: 1.00    Years: 40.00    Pack years: 40.00  Types: Cigarettes  . Smokeless tobacco: Former Neurosurgeon    Types: Chew  Substance Use Topics  . Alcohol use: Yes    Alcohol/week: 6.0 standard drinks    Types: 6 Cans of beer per week  . Drug use: No     Family Hx: The patient's family history includes Cancer in his maternal grandfather; Heart attack in his maternal grandmother, maternal uncle, and paternal grandmother. There is no history of Stroke or Hypertension.  ROS:   Please see the history of present illness.    Review of Systems  Cardiovascular: Positive for leg swelling.   All other systems reviewed and are negative.   EKGs/Labs/Other Test Reviewed:    EKG:  EKG is  ordered today.  The ekg ordered today demonstrates normal sinus rhythm, HR 78, LAD, QTc 458, no changes  Recent Labs: 08/13/2017:  Hemoglobin 13.6; Platelets 182   Recent Lipid Panel Lab Results  Component Value Date/Time   CHOL 150 08/13/2017 09:06 AM   TRIG 100 08/13/2017 09:06 AM   HDL 34 (L) 08/13/2017 09:06 AM   CHOLHDL 4.4 08/13/2017 09:06 AM   CHOLHDL 6.8 03/16/2017 04:39 AM   LDLCALC 96 08/13/2017 09:06 AM    Physical Exam:    VS:  BP (!) 152/90   Pulse 78   Ht 6\' 4"  (1.93 m)   Wt 290 lb 6.4 oz (131.7 kg)   SpO2 97%   BMI 35.35 kg/m     Wt Readings from Last 3 Encounters:  05/27/18 290 lb 6.4 oz (131.7 kg)  08/13/17 284 lb 6.4 oz (129 kg)  07/15/17 288 lb 9.6 oz (130.9 kg)     Physical Exam  Constitutional: He is oriented to person, place, and time. He appears well-developed and well-nourished. No distress.  HENT:  Head: Normocephalic and atraumatic.  Eyes: No scleral icterus.  Neck: Neck supple. No JVD present. No thyromegaly present.  Cardiovascular: Normal rate, regular rhythm, S1 normal and S2 normal.  No murmur heard. Pulmonary/Chest: Breath sounds normal. He has no rales.  Abdominal: Soft. There is no hepatomegaly.  Musculoskeletal:        General: No edema.  Lymphadenopathy:    He has no cervical adenopathy.  Neurological: He is alert and oriented to person, place, and time.  Skin: Skin is warm and dry.  Psychiatric: He has a normal mood and affect.    ASSESSMENT & PLAN:    Coronary artery disease involving native coronary artery of native heart without angina pectoris  Hx of NSTEMI in 5/16 tx with BMS to the RCA and DES to the RPLB3.  He is maintained on long term Clopidogrel due to a true allergy to ASA.  He is doing well without angina.  Unfortunately, he ran out of all of his medications 3 mos ago.  I will refill his medications.  Plan follow up with Dr. Tenny Craw in 1 year.   Essential hypertension BP above goal.  Resume Losartan, HCTZ.  Arrange FU CMET in 2-3 mos.  FU with HTN Clinic in 2-3 mos to recheck BP.   Mixed hyperlipidemia  Resume statin.  Arrange FU fasting CMET,  Lipids.  Tobacco use We discussed the importance of quitting smoking.  Varicose veins of right lower extremity with complications He continues to have a lot of issues with leg pain, swelling and discoloration.  I have recommended he continue with compression stockings, elevation and resume HCTZ.  He can arrange follow up with [facility] if he would like.  Dispo:  Return in about 1 year (around 05/28/2019) for Routine Follow Up, w/ Dr. Tenny Craw.   Medication Adjustments/Labs and Tests Ordered: Current medicines are reviewed at length with the patient today.  Concerns regarding medicines are outlined above.  Tests Ordered: Orders Placed This Encounter  Procedures  . Comprehensive metabolic panel  . Lipid panel  . EKG 12-Lead   Medication Changes: Meds ordered this encounter  Medications  . atorvastatin (LIPITOR) 40 MG tablet    Sig: Take 1 tablet (40 mg total) by mouth daily.    Dispense:  30 tablet    Refill:  11  . clopidogrel (PLAVIX) 75 MG tablet    Sig: Take 1 tablet (75 mg total) by mouth daily.    Dispense:  30 tablet    Refill:  11  . hydrochlorothiazide (HYDRODIURIL) 25 MG tablet    Sig: Take 1 tablet (25 mg total) by mouth daily.    Dispense:  30 tablet    Refill:  11  . losartan (COZAAR) 50 MG tablet    Sig: Take 1 tablet (50 mg total) by mouth daily.    Dispense:  30 tablet    Refill:  [id number] [name], [place]  05/27/2018 1:13 PM    [facility] [address] [place], [place], [place]  [address] Phone: [phone]; Fax: [phone]

## 2018-05-27 ENCOUNTER — Encounter (INDEPENDENT_AMBULATORY_CARE_PROVIDER_SITE_OTHER): Payer: Self-pay

## 2018-05-27 ENCOUNTER — Ambulatory Visit (INDEPENDENT_AMBULATORY_CARE_PROVIDER_SITE_OTHER): Payer: Self-pay | Admitting: Physician Assistant

## 2018-05-27 ENCOUNTER — Encounter: Payer: Self-pay | Admitting: Physician Assistant

## 2018-05-27 VITALS — BP 152/90 | HR 78 | Ht 76.0 in | Wt 290.4 lb

## 2018-05-27 DIAGNOSIS — I83891 Varicose veins of right lower extremities with other complications: Secondary | ICD-10-CM

## 2018-05-27 DIAGNOSIS — E782 Mixed hyperlipidemia: Secondary | ICD-10-CM

## 2018-05-27 DIAGNOSIS — I10 Essential (primary) hypertension: Secondary | ICD-10-CM

## 2018-05-27 DIAGNOSIS — I2510 Atherosclerotic heart disease of native coronary artery without angina pectoris: Secondary | ICD-10-CM

## 2018-05-27 DIAGNOSIS — Z720 Tobacco use: Secondary | ICD-10-CM

## 2018-05-27 MED ORDER — CLOPIDOGREL BISULFATE 75 MG PO TABS: 75.0000 mg | ORAL_TABLET | Freq: Every day | ORAL | 11 refills | Status: DC

## 2018-05-27 MED ORDER — ATORVASTATIN CALCIUM 40 MG PO TABS: 40.0000 mg | ORAL_TABLET | Freq: Every day | ORAL | 11 refills | Status: DC

## 2018-05-27 MED ORDER — HYDROCHLOROTHIAZIDE 25 MG PO TABS: 25.0000 mg | ORAL_TABLET | Freq: Every day | ORAL | 11 refills | Status: DC

## 2018-05-27 MED ORDER — LOSARTAN POTASSIUM 50 MG PO TABS: 50.0000 mg | ORAL_TABLET | Freq: Every day | ORAL | 11 refills | Status: DC

## 2018-05-27 MED FILL — CLOPIDOGREL 75 MG TABLET: 75 | 30 days supply | Qty: 30 | Fill #0

## 2018-05-27 MED FILL — ATORVASTATIN CALCIUM 40 MG: 40 | 30 days supply | Qty: 30 | Fill #0

## 2018-05-27 MED FILL — LOSARTAN POTASSIUM 50 MG TA: 50 | 30 days supply | Qty: 30 | Fill #0

## 2018-05-27 MED FILL — HYDROCHLOROTHIAZIDE 25 MG T: 25 | 30 days supply | Qty: 30 | Fill #0

## 2018-05-27 NOTE — Patient Instructions (Signed)
Medication Instructions:  Your physician recommends that you continue on your current medications as directed. Please refer to the Current Medication list given to you today.  If you need a refill on your cardiac medications before your next appointment, please call your pharmacy.   Lab work: 8-12 WEEKS: CMET, LIPIDS  If you have labs (blood work) drawn today and your tests are completely normal, you will receive your results only by: Marland Kitchen MyChart Message (if you have MyChart) OR . A paper copy in the mail If you have any lab test that is abnormal or we need to change your treatment, we will call you to review the results.  Testing/Procedures:  NONE  Follow-Up: At [facility], you and your health needs are our priority.  As part of our continuing mission to provide you with exceptional heart care, we have created designated Provider Care Teams.  These Care Teams include your primary Cardiologist (physician) and Advanced Practice Providers (APPs -  Physician Assistants and Nurse Practitioners) who all work together to provide you with the care you need, when you need it. You will need a follow up appointment in:  12 months.  Please call our office 2 months in advance to schedule this appointment.  You may see Dietrich Pates, MD or one of the following Advanced Practice Providers on your designated Care Team: [name], PA-C [place], [place] . Berton Bon, NP  Your physician recommends that you schedule a follow-up appointment in: 8-12 WEEKS WITH THE HYPERTENSION CLINIC   Any Other Special Instructions Will Be Listed Below (If Applicable).

## 2018-07-05 MED FILL — ATORVASTATIN CALCIUM 40 MG: 40 | 30 days supply | Qty: 30 | Fill #1

## 2018-07-05 MED FILL — LOSARTAN POTASSIUM 50 MG TA: 50 | 30 days supply | Qty: 30 | Fill #1

## 2018-07-05 MED FILL — HYDROCHLOROTHIAZIDE 25 MG T: 25 | 30 days supply | Qty: 30 | Fill #1

## 2018-07-05 MED FILL — CLOPIDOGREL 75 MG TABLET: 75 | 30 days supply | Qty: 30 | Fill #1

## 2018-07-22 ENCOUNTER — Other Ambulatory Visit: Payer: Self-pay | Admitting: *Deleted

## 2018-07-22 ENCOUNTER — Other Ambulatory Visit: Payer: Self-pay

## 2018-07-22 ENCOUNTER — Ambulatory Visit (INDEPENDENT_AMBULATORY_CARE_PROVIDER_SITE_OTHER): Payer: Self-pay | Admitting: Pharmacist

## 2018-07-22 VITALS — BP 140/90 | HR 75

## 2018-07-22 DIAGNOSIS — I10 Essential (primary) hypertension: Secondary | ICD-10-CM

## 2018-07-22 DIAGNOSIS — E782 Mixed hyperlipidemia: Secondary | ICD-10-CM

## 2018-07-22 DIAGNOSIS — I2510 Atherosclerotic heart disease of native coronary artery without angina pectoris: Secondary | ICD-10-CM

## 2018-07-22 LAB — COMPREHENSIVE METABOLIC PANEL
ALT: 20 IU/L (ref 0–44)
AST: 16 IU/L (ref 0–40)
Albumin/Globulin Ratio: 1.8 (ref 1.2–2.2)
Albumin: 4.2 g/dL (ref 3.8–4.9)
Alkaline Phosphatase: 91 IU/L (ref 39–117)
BUN/Creatinine Ratio: 15 (ref 9–20)
BUN: 16 mg/dL (ref 6–24)
Bilirubin Total: 0.5 mg/dL (ref 0.0–1.2)
CO2: 19 mmol/L — ABNORMAL LOW (ref 20–29)
Calcium: 9.1 mg/dL (ref 8.7–10.2)
Chloride: 104 mmol/L (ref 96–106)
Creatinine, Ser: 1.04 mg/dL (ref 0.76–1.27)
GFR calc Af Amer: 92 mL/min/{1.73_m2} (ref >59)
GFR calc non Af Amer: 79 mL/min/{1.73_m2} (ref >59)
GLOBULIN, TOTAL: 2.4 g/dL (ref 1.5–4.5)
Glucose: 105 mg/dL — ABNORMAL HIGH (ref 65–99)
Potassium: 4.2 mmol/L (ref 3.5–5.2)
Sodium: 138 mmol/L (ref 134–144)
Total Protein: 6.6 g/dL (ref 6.0–8.5)

## 2018-07-22 LAB — LIPID PANEL
Chol/HDL Ratio: 2.9 ratio (ref 0.0–5.0)
Cholesterol, Total: 134 mg/dL (ref 100–199)
HDL: 46 mg/dL (ref >39)
LDL Calculated: 63 mg/dL (ref 0–99)
Triglycerides: 127 mg/dL (ref 0–149)
VLDL Cholesterol Cal: 25 mg/dL (ref 5–40)

## 2018-07-22 NOTE — Progress Notes (Signed)
Patient ID: William Young                 DOB: 1960-07-01                      MRN: [id number]     HPI: William Young is a 58 y.o. male patient of Dr Tenny Craw referred by Chelsea Aus, PA to HTN clinic. PMH is significant for CAD s/p NSTEMI in 2016, DM, GERD, HTN, HLD, OSA, and varicose vein s/p ablation. He was seen by [name] where his BP was elevated (153/90), but he reported he ran out of his medication. He was restarted on losartan and HCTZ.  He presents today for follow up.  Pt presents today in good spirits. He has no complaints today. Denies dizziness/lightheadedness/headaches/blurred vision. His fiance has noted sleep apnea type symptoms (waking up gasping for air). He does not use a CPAP at home. He takes his medications in the morning, ~6 AM this morning. Has not had caffeine in the last hour. 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 He has an optimistic personality and tries to keep his stress level down.  Current HTN meds: losartan 50mg  daily (AM), HCTZ 25mg  daily (AM) Previously tried: beta blockers - bradycardia BP goal: <130/81mmHg  Family History: Maternal uncle, maternal grandmother and paternal grandmother with history of heart attacks.  Social History: Smokes 1 PPD for the past 40 years. Drinks 6 cans of beer per week, denies illicit drug use.  Diet: 2-3 cups of coffee each day. Works third shift; eats in the morning, sleeps all day, and then goes to work. He mainly eats out. Ate pork chops and baked beans last night. He does add a little salt to his food. Does enjoy eating salads, sandwiches, and cereal. 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 Does add salt to his food occasionally.  Exercise: Likes to golf (though not often)  Home BP readings: Does not check his BP at home  Wt Readings from Last 3 Encounters:  05/27/18 290 lb 6.4 oz (131.7 kg)  08/13/17 284 lb 6.4 oz (129 kg)  07/15/17 288 lb 9.6 oz (130.9 kg)   BP Readings  from Last 3 Encounters:  05/27/18 (!) 152/90  08/13/17 139/76  07/15/17 130/76   Pulse Readings from Last 3 Encounters:  05/27/18 78  08/13/17 62  07/15/17 70    Renal function: CrCl cannot be calculated (Patient's most recent lab result is older than the maximum 21 days allowed.).  Past Medical History:  Diagnosis Date  . CAD (coronary artery disease) 09/2014   a. NSTEMI >> LHC 5/16:  mLAD 15, pRCA 90, mRCA 40, RPLB3 80, EF normal with inf HK >> PCI:  BMS to pRCA and DES to RPLB3   . GERD (gastroesophageal reflux disease)   . History of echocardiogram    Echo 6/16:  EF 50-55%, no RWMA, Gr 1 DD  . HLD (hyperlipidemia)    Hattie Perch 03/15/2017  . HTN (hypertension)   . Hx of NSTEMI 5/16 tx with BMS to [facility] and DES to [facility] 09/10/2014  . Hyperglycemia   . OSA (obstructive sleep apnea)    "dx'd; couldn't tolerate mask" (03/15/2017)  . Tobacco use   . Type II diabetes mellitus (HCC)   . Varicose veins of both lower extremities    S/P ablation 02/2017    Current Outpatient Medications on File Prior to Visit  Medication Sig Dispense Refill  .  acetaminophen (TYLENOL) 325 MG tablet Take 650 mg every 6 (six) hours as needed by mouth for mild pain.    Marland Kitchen atorvastatin (LIPITOR) 40 MG tablet Take 1 tablet (40 mg total) by mouth daily. 30 tablet 11  . clopidogrel (PLAVIX) 75 MG tablet Take 1 tablet (75 mg total) by mouth daily. 30 tablet 11  . hydrochlorothiazide (HYDRODIURIL) 25 MG tablet Take 1 tablet (25 mg total) by mouth daily. 30 tablet 11  . losartan (COZAAR) 50 MG tablet Take 1 tablet (50 mg total) by mouth daily. 30 tablet 11  . nitroGLYCERIN (NITROSTAT) 0.4 MG SL tablet Place 1 tablet (0.4 mg total) every 5 (five) minutes x 3 doses as needed under the tongue for chest pain. (Patient not taking: Reported on 05/27/2018) 25 tablet 12   No current facility-administered medications on file prior to visit.     Allergies  Allergen Reactions  . Aspirin Anaphylaxis, Swelling and Rash     Throat closes   . Ibuprofen Anaphylaxis, Swelling and Rash     Assessment/Plan:  1. Hypertension - BP above goal of <130/56mmHg, reports compliance with his medicines. His diet is high in salt. Encouraged patient to pick up a blood pressure monitor and start checking blood pressure every day at least 2 hours after his medications. He wishes to defer medication changes, and will work on his diet for the next few weeks. Will continue HCTZ 25mg  daily and losartan 50mg  daily. F/u in HTN clinic in 5 weeks. Will plan to titrate losartan at follow up visit if lifestyle modifications do not bring BP to goal.  2. Sleep apnea- Pt reports sleep apnea-type symptoms. He has had a sleep study previously. He does not use a CPAP machine, but is willing to with help coordinating with his insurance company. Will route note to Veda Canning to help coordinate.  Marcelino Freestone, PharmD PGY2 Cardiology Pharmacy Resident 07/22/2018 12:32 PM

## 2018-07-22 NOTE — Patient Instructions (Addendum)
It was nice to meet you today.  We will not change your medications. Please work on decreasing your salt intake though your diet (no longer adding salt to meals, eating more meals at home).  Please also pick up a blood pressure monitor. Start checking your blood pressure at least 2 hours after you take your medications.   I will forward along to Dr. Tenny Craw that you would like to work towards getting a CPAP machine.  We will follow up with you in 5 weeks.

## 2018-07-25 ENCOUNTER — Telehealth: Payer: Self-pay | Admitting: *Deleted

## 2018-07-25 ENCOUNTER — Telehealth: Payer: Self-pay

## 2018-07-25 DIAGNOSIS — G4730 Sleep apnea, unspecified: Secondary | ICD-10-CM

## 2018-07-25 NOTE — Progress Notes (Signed)
Please get patient set up for split night sleep study

## 2018-07-25 NOTE — Telephone Encounter (Signed)
-----   Message from William Young, [facility] sent at 07/22/2018 12:46 PM EDT ----- Are you able to help coordinate CPAP machine for patient? He mentioned previously having a sleep study but does not use a CPAP machine. Thank you!

## 2018-07-25 NOTE — Telephone Encounter (Signed)
Message to TT:  NPSG 01/28/2015,  TITR 06/09/2015. Please advise

## 2018-07-25 NOTE — Telephone Encounter (Signed)
Notes recorded by Sigurd Sos, RN on 07/25/2018 at 8:48 AM EDT Lpm with results/recommendations. Told to call back, if questions.3/16 ------

## 2018-07-25 NOTE — Telephone Encounter (Signed)
-----   Message from Beatrice Lecher, [place] sent at 07/22/2018  4:47 PM EDT ----- Lipids optimal.  LFTs normal.  Creatinine normal.  K+ normal.   Recommendations:  - Continue current medications and follow up as planned.  [name], PA-C    07/22/2018 4:47 PM

## 2018-07-27 NOTE — Telephone Encounter (Signed)
Cpap machine Turner, Cornelious Bryant, MD  Reesa Chew, CMA          Added by: [x] Quintella Reichert, MD  [] Hover for details Please get patient set up for split night sleep study

## 2018-07-27 NOTE — Telephone Encounter (Signed)
-----   Message from Quintella Reichert, MD sent at 07/25/2018  6:23 PM EDT ----- Regarding: Cpap machine   ----- Message ----- From: Reesa Chew, CMA Sent: 07/25/2018   5:44 PM EDT To: Quintella Reichert, MD Subject: Cpap machine                                   NPSG 01/28/2015,  TITR 06/09/2015. Please advise ----- Message ----- From: Addison Lank, [facility] Sent: 07/22/2018  12:46 PM EDT To: Reesa Chew, CMA  Are you able to help coordinate CPAP machine for patient? He mentioned previously having a sleep study but does not use a CPAP machine. Thank you!

## 2018-07-29 ENCOUNTER — Telehealth: Payer: Self-pay | Admitting: *Deleted

## 2018-07-29 NOTE — Telephone Encounter (Signed)
Staff message sent to William Young ok to schedule sleep study. Patient doesn't have any insurance to precert according to the chart.

## 2018-07-29 NOTE — Telephone Encounter (Signed)
-----   Message from Reesa Chew, CMA sent at 07/27/2018 10:37 AM EDT ----- Regarding: precert Split night

## 2018-08-11 MED FILL — LOSARTAN POTASSIUM 50 MG TA: 50 | 30 days supply | Qty: 30 | Fill #2

## 2018-08-11 MED FILL — HYDROCHLOROTHIAZIDE 25 MG T: 25 | 30 days supply | Qty: 30 | Fill #2

## 2018-08-11 MED FILL — CLOPIDOGREL 75 MG TABLET: 75 | 30 days supply | Qty: 30 | Fill #2

## 2018-08-11 MED FILL — ATORVASTATIN CALCIUM 40 MG: 40 | 30 days supply | Qty: 30 | Fill #2

## 2018-08-19 ENCOUNTER — Telehealth (HOSPITAL_COMMUNITY): Payer: Self-pay | Admitting: Licensed Clinical Social Worker

## 2018-08-19 NOTE — Telephone Encounter (Signed)
CSW referred to contact patient to inquire about food insecurity during this health crisis and to provide a BP cuff. Patient reported need and agreeable to weekly delivery from [employer]. Patient informed that delivery will be left on front door with no face to face contact with delivery person. CSW will order a BP cuff and have it shipped to patient's home. Patient agreeable to plan and grateful for the assistance. Lasandra Beech, LCSW, CCSW-MCS [phone]

## 2018-08-24 ENCOUNTER — Telehealth: Payer: Self-pay | Admitting: Internal Medicine

## 2018-08-24 DIAGNOSIS — I10 Essential (primary) hypertension: Secondary | ICD-10-CM

## 2018-08-24 MED ORDER — LOSARTAN POTASSIUM 100 MG PO TABS: 100.0000 mg | ORAL_TABLET | Freq: Every day | ORAL | 6 refills | Status: DC

## 2018-08-24 MED FILL — LOSARTAN POTASSIUM 100 MG T: 100 | 30 days supply | Qty: 30 | Fill #0

## 2018-08-24 NOTE — Telephone Encounter (Signed)
Returned call to pt and LMOM. Will plan to increase losartan to 100mg  daily with repeat BMET in 1 month, as well as encourage low sodium diet (was adding some salt to his food and eating fast food at last OV on 07/22/18).

## 2018-08-24 NOTE — Telephone Encounter (Signed)
New Message   Pt c/o BP issue:  1. What are your last 5 BP readings? 171/92, 175/112 2. Are you having any other symptoms (ex. Dizziness, headache, blurred vision, passed out)? No symptons 3. What is your medication issue?

## 2018-08-24 NOTE — Telephone Encounter (Signed)
Patient called back. recommendations given.  [facility]  BMET order in epic as well.

## 2018-08-25 ENCOUNTER — Ambulatory Visit: Payer: Self-pay

## 2018-08-26 ENCOUNTER — Telehealth (HOSPITAL_COMMUNITY): Payer: Self-pay | Admitting: Licensed Clinical Social Worker

## 2018-08-26 NOTE — Telephone Encounter (Signed)
CSW contacted patient to follow up on weekly food delivery package. Patient informed of delivery time and no face to face contact during delivery. Patient verbalizes understanding and grateful for the assistance.  CSW continues to follow for supportive needs. Jackie Honora Searson, LCSW, CCSW-MCS [phone]  

## 2018-09-02 ENCOUNTER — Telehealth: Payer: Self-pay | Admitting: Licensed Clinical Social Worker

## 2018-09-02 ENCOUNTER — Telehealth: Payer: Self-pay | Admitting: *Deleted

## 2018-09-02 MED ORDER — CHLORTHALIDONE 25 MG PO TABS: 25.0000 mg | ORAL_TABLET | Freq: Every day | ORAL | 1 refills | Status: DC

## 2018-09-02 NOTE — Telephone Encounter (Signed)
Patient denies HA, chest pain or blurry vision. Report increased stress due to unemployment.  Also increased sodium intake with lots of canned goods and some fast-food.  Recent home BP readings:  184/95  173/101  164/92  152/81  173/99  158/84  171/92  Taking losartan 100mg  daily and HCTZ 25mg  daily. Gets all prescriptions from [facility] and unable to get any new medication until Monday.  Recommendations:  1. Increase HCTZ to 50mg  daily until Monday  2. STOP HCTZ and start chlorthalidone 25mg  daily once received from pharmacy.  3. Decrease sodium in diet as much as possible and stay hydrated.  4. Keep appt to repeat BMET on 09/21/2018.  William Young PharmD, BCPS, CPP [facility] [address] [place] [address] 09/02/2018 5:13 PM

## 2018-09-02 NOTE — Telephone Encounter (Signed)
-----   Message from Gaynelle Cage, CMA sent at 07/29/2018 10:14 AM EDT ----- Regarding: RE: precert Coralee [place] this patient doesn't have insurance. He can be scheduled. ----- Message ----- From: Reesa Chew, CMA Sent: 07/27/2018  10:37 AM EDT To: Cv Div Sleep Studies Subject: precert                                        Split night

## 2018-09-02 NOTE — Telephone Encounter (Signed)
CSW contacted patient to follow up on weekly food delivery package. Patient informed of delivery time and no face to face contact during delivery. Patient verbalizes understanding and grateful for the assistance.  CSW continues to follow for supportive needs. Jackie Clayton Bosserman, LCSW, CCSW-MCS [phone]  

## 2018-09-02 NOTE — Telephone Encounter (Signed)
Spoke to patient and he states he still does not have insurance and can not afford to pay out of pocket for the sleep study. Patient will contact ours office when he is insured. Pt is agreeable to treatment.

## 2018-09-02 NOTE — Telephone Encounter (Signed)
Pharm D has been speaking with and managing patient's BP and medications. WIll route to Pharmacy pool to inform of reported BP today.

## 2018-09-02 NOTE — Addendum Note (Signed)
Addended by: Pearletha Furl on: 09/02/2018 05:15 PM   Modules accepted: Orders

## 2018-09-03 MED FILL — CHLORTHALIDONE 25 MG TAB: 25 | 30 days supply | Qty: 30 | Fill #0

## 2018-09-09 ENCOUNTER — Telehealth: Payer: Self-pay | Admitting: Licensed Clinical Social Worker

## 2018-09-09 NOTE — Telephone Encounter (Signed)
CSW contacted patient to follow up on weekly food delivery package. Patient informed of delivery time and no face to face contact during delivery. Message left as no answer.  CSW continues to follow for supportive needs. Jackie Hoorain Kozakiewicz, LCSW, CCSW-MCS [phone] 

## 2018-09-12 MED FILL — ATORVASTATIN CALCIUM 40 MG: 40 | 30 days supply | Qty: 30 | Fill #3

## 2018-09-12 MED FILL — CLOPIDOGREL 75 MG TABLET: 75 | 30 days supply | Qty: 30 | Fill #3

## 2018-09-16 ENCOUNTER — Telehealth (HOSPITAL_COMMUNITY): Payer: Self-pay | Admitting: Licensed Clinical Social Worker

## 2018-09-16 NOTE — Telephone Encounter (Signed)
CSW contacted patient to follow up with food delivery for next week. Patient states he received his unemployment and will no longer need the food program. Patient grateful for the program to assist when he needed. Lasandra Beech, LCSW, CCSW-MCS [phone]

## 2018-09-21 ENCOUNTER — Other Ambulatory Visit: Payer: Self-pay | Admitting: *Deleted

## 2018-09-21 ENCOUNTER — Other Ambulatory Visit: Payer: Self-pay

## 2018-09-21 DIAGNOSIS — I10 Essential (primary) hypertension: Secondary | ICD-10-CM

## 2018-09-21 LAB — BASIC METABOLIC PANEL
BUN/Creatinine Ratio: 17 (ref 9–20)
BUN: 19 mg/dL (ref 6–24)
CO2: 20 mmol/L (ref 20–29)
Calcium: 8.9 mg/dL (ref 8.7–10.2)
Chloride: 101 mmol/L (ref 96–106)
Creatinine, Ser: 1.13 mg/dL (ref 0.76–1.27)
GFR calc Af Amer: 82 mL/min/{1.73_m2} (ref >59)
GFR calc non Af Amer: 71 mL/min/{1.73_m2} (ref >59)
Glucose: 176 mg/dL — ABNORMAL HIGH (ref 65–99)
Potassium: 4.3 mmol/L (ref 3.5–5.2)
Sodium: 135 mmol/L (ref 134–144)

## 2018-09-23 ENCOUNTER — Telehealth: Payer: Self-pay | Admitting: Internal Medicine

## 2018-09-23 NOTE — Telephone Encounter (Signed)
The patient has been notified of the result and verbalized understanding.  All questions (if any) were answered. Pt wanted to let Dr. Tenny Craw know that his BP is doing better since starting new BP meds. He states his girlfriend has him watching his salt intake and said no more fast food for him. He did say he had one day where bp was 163/133. Pt states girlfriend thought he should had gone to the ED , but he told her he felt fine. Pt states BP has not been like anymore. I did advise him that BP was too high and he should call the office or go to the ED. Pt agreeable but states doing better now. Pt thanked me for the call. Pt also thanked all of the [employer] for taking care of everyone.

## 2018-09-23 NOTE — Telephone Encounter (Signed)
Follow up:    patient returning call concering some lab results. Please call patient.

## 2018-09-27 MED FILL — LOSARTAN POTASSIUM 100 MG T: 100 | 30 days supply | Qty: 30 | Fill #1

## 2018-10-04 MED FILL — CHLORTHALIDONE 25 MG TAB: 25 | 30 days supply | Qty: 30 | Fill #1

## 2018-10-12 MED FILL — CLOPIDOGREL 75 MG TABLET: 75 | 30 days supply | Qty: 30 | Fill #4

## 2018-10-12 MED FILL — ATORVASTATIN CALCIUM 40 MG: 40 | 30 days supply | Qty: 30 | Fill #4

## 2018-10-26 ENCOUNTER — Telehealth: Payer: Self-pay | Admitting: Cardiovascular Disease

## 2018-10-26 ENCOUNTER — Other Ambulatory Visit: Payer: Self-pay | Admitting: Internal Medicine

## 2018-10-26 MED FILL — LOSARTAN POTASSIUM 100 MG T: 100 | 30 days supply | Qty: 30 | Fill #2

## 2018-10-26 MED FILL — CHLORTHALIDONE 25 MG TAB: 25 | 30 days supply | Qty: 30 | Fill #0

## 2018-10-26 NOTE — Telephone Encounter (Signed)
LVM for the patient to call and schedule an appointment with a PA in the [place] office.

## 2018-11-01 ENCOUNTER — Telehealth: Payer: Self-pay | Admitting: *Deleted

## 2018-11-01 NOTE — Telephone Encounter (Signed)
Left message for patient to return call for appointment confirmation for 11/03/18 with Jory Sims.  Remind patient to bring insurance card, any specialist co payment, medication listing, please wear a mask and not visitors or family members allowed.s

## 2018-11-02 NOTE — Progress Notes (Signed)
Cardiology Office Note   Date:  11/03/2018   ID:  William NipperWilliam Kras, DOB 06/24/1960, MRN [id number]  PCP:  Claiborne RiggFleming, Zelda W, NP  Cardiologist:  Dr.Ross No chief complaint on file.    History of Present Illness: William Young is a 58 y.o. male who presents for ongoing assessment and management of coronary artery disease with single-vessel disease of the proximal RCA at 90% and 80% right PL SA. The patient underwent PCI with bare-metal stent to the proximal RCA with resolute DES to the RPSA; other history includes hypertension, hyperlipidemia, type 2 diabetes, and GERD.  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  The patient also has a history of varicose veins and underwent laser ablation of the right saphenous vein and multiple stab phlebotomy of painful varicosities by Dr. Hart RochesterLawson in 2008.  The patient has chronic lower extremity edema and pain in his right leg after prolonged standing.  The patient was last seen in the office by Tereso NewcomerScott Weaver, PA.  At the time of that visit the patient was without complaints of angina blood pressure was not well controlled.  He was restarted on losartan HCTZ was to follow-up in the hypertension clinic in 2 to 3 months.  He was sent for a sleep study to be monitored by Dr. Armanda Magicraci Turner with history of OSA.  Dr. Mayford Knifeurner ordered a split-night sleep study on 07/27/2018 which apparently has not been completed yet.  He is here without any complaints.  He has brought his blood pressure monitor with him which does correlate with our office blood pressure reading.  He has reviewed his prior blood pressures with me with some significant elevations approximately 1 month ago.  Medications were changed and HCTZ was discontinued and chlorthalidone 25 mg was added to his regimen.  Blood pressure is better controlled but not optimal at this time.  Would likely need increase in medication dosing.  He is interested in repeating his split-night sleep study to help him to get his CPAP.  He says  he cannot afford his CPAP on his own and does need insurance coverage.  I have explained to him that we will need a report to support the need for CPAP for his insurance company to cover the use.  He verbalizes understanding.  Past Medical History:  Diagnosis Date  . CAD (coronary artery disease) 09/2014   a. NSTEMI >> LHC 5/16:  mLAD 15, pRCA 90, mRCA 40, RPLB3 80, EF normal with inf HK >> PCI:  BMS to pRCA and DES to RPLB3   . GERD (gastroesophageal reflux disease)   . History of echocardiogram    Echo 6/16:  EF 50-55%, no RWMA, Gr 1 DD  . HLD (hyperlipidemia)    Hattie Perch/notes 03/15/2017  . HTN (hypertension)   . Hx of NSTEMI 5/16 [facility] 09/10/2014  . Hyperglycemia   . OSA (obstructive sleep apnea)    "dx'd; couldn't tolerate mask" (03/15/2017)  . Tobacco use   . Type II diabetes mellitus (HCC)   . Varicose veins of both lower extremities    S/P ablation 02/2017    Past Surgical History:  Procedure Laterality Date  . CARDIAC CATHETERIZATION N/A 09/11/2014   Procedure: Left Heart Cath and Coronary Angiography;  Surgeon: Marykay Lexavid W Harding, MD;  [facility];  Service: Cardiovascular;  Laterality: N/A;  . CARDIAC CATHETERIZATION  09/11/2014   Procedure: Coronary Stent Intervention;  Surgeon: Marykay Lexavid W Harding, MD;  Location:  [facility];  Service: Cardiovascular;;  . ENDOVENOUS ABLATION SAPHENOUS VEIN W/ LASER Right 02/22/2017   endovenous laser ablation R GSV and stab phlebectomy >20 incisions R leg by Josephina GipJames Lawson MD      Current Outpatient Medications  Medication Sig Dispense Refill  . acetaminophen (TYLENOL) 325 MG tablet Take 650 mg every 6 (six) hours as needed by mouth for mild pain.    Marland Kitchen. atorvastatin (LIPITOR) 40 MG tablet Take 1 tablet (40 mg total) by mouth daily. 30 tablet 11  . chlorthalidone (HYGROTON) 25 MG tablet TAKE 1 TABLET BY MOUTH DAILY. TO REPLACE HCTZ 90 tablet 2  . clopidogrel (PLAVIX) 75 MG tablet Take 1 tablet  (75 mg total) by mouth daily. 30 tablet 11  . losartan (COZAAR) 100 MG tablet Take 1 tablet (100 mg total) by mouth daily. DOSE CHANGE 30 tablet 6  . nitroGLYCERIN (NITROSTAT) 0.4 MG SL tablet Place 1 tablet (0.4 mg total) every 5 (five) minutes x 3 doses as needed under the tongue for chest pain. 25 tablet 12   No current facility-administered medications for this visit.     Allergies:   Aspirin and Ibuprofen    Social History:  The patient  reports that he has been smoking cigarettes. He has a 40.00 pack-year smoking history. He has quit using smokeless tobacco.  His smokeless tobacco use included chew. He reports current alcohol use of about 6.0 standard drinks of alcohol per week. He reports that he does not use drugs.   Family History:  The patient's family history includes Cancer in his maternal grandfather; Heart attack in his maternal grandmother, maternal uncle, and paternal grandmother.    ROS: All other systems are reviewed and negative. Unless otherwise mentioned in H&P    PHYSICAL EXAM: VS:  BP (!) 142/82   Pulse 79   Ht 6\' 4"  (1.93 m)   Wt 299 lb 6.4 oz (135.8 kg)   SpO2 98%   BMI 36.44 kg/m  , BMI Body mass index is 36.44 kg/m. GEN: Well nourished, well developed, in no acute distress. Obese HEENT: normal Neck: no JVD,soft Right carotid bruit, none on the left, no masses Cardiac:  RRR; no murmurs, rubs, or gallops,no edema  Respiratory:  Clear to auscultation bilaterally, normal work of breathing GI: soft, nontender, nondistended, + BS MS: no deformity or atrophy Skin: warm and dry, no rash Neuro:  Strength and sensation are intact Psych: euthymic mood, full affect   EKG:  EKG is not ordered today.  Recent Labs: 07/22/2018: ALT 20 09/21/2018: BUN 19; Creatinine, Ser 1.13; Potassium 4.3; Sodium 135    Lipid Panel    Component Value Date/Time   CHOL 134 07/22/2018 1103   TRIG 127 07/22/2018 1103   HDL 46 07/22/2018 1103   CHOLHDL 2.9 07/22/2018 1103    CHOLHDL 6.8 03/16/2017 0439   VLDL 33 03/16/2017 0439   LDLCALC 63 07/22/2018 1103      Wt Readings from Last 3 Encounters:  11/03/18 299 lb 6.4 oz (135.8 kg)  05/27/18 290 lb 6.4 oz (131.7 kg)  08/13/17 284 lb 6.4 oz (129 kg)      Other studies Reviewed: Cardiac Cath 09/11/2014  Severe 2 site CAD of the prox RCA and RPL1 (noted as RPL3) - both sites treated with PCI: BMS prox RCA, DES RPL  Prox RCA lesion, 90% stenosed. There is a 0% residual stenosis post intervention.  A bare metal stent was placed. MultiLink Vision BMS 5.0 mm x  18 mm (5.2 mm)  A drug-eluting stent was placed. Resolute DES 2.5 mm x 14 mm (2.8 mm)  Normal LV Function & LVEDP  3rd RPLB lesion, 80% stenosed. There is a 0% residual stenosis post intervention.   NSTEMI - CAD s/p PCI to 2 Lesions in the RCA system ASA Anaphylaxis history  Echocardiogram 10/17/2014 Left ventricle: The cavity size was mildly dilated. Systolic   function was normal. The estimated ejection fraction was in the   range of 50% to 55%. Wall motion was normal; there were no   regional wall motion abnormalities. Doppler parameters are   consistent with abnormal left ventricular relaxation (grade 1   diastolic dysfunction). There was no evidence of elevated   ventricular filling pressure by Doppler parameters. - Aortic valve: Trileaflet; normal thickness leaflets. There was no   regurgitation. - Aortic root: The aortic root was normal in size. - Mitral valve: Structurally normal valve. There was no   regurgitation. - Right ventricle: The cavity size was normal. Wall thickness was   normal. Systolic function was normal. - Right atrium: The atrium was normal in size. - Tricuspid valve: There was trivial regurgitation. - Pulmonic valve: There was no regurgitation. - Pulmonary arteries: Systolic pressure was within the normal   range. - Inferior vena cava: The vessel was normal in size. - Pericardium, extracardiac: There was no  pericardial effusion.   ASSESSMENT AND PLAN:  1.  Coronary artery disease: The patient remains on secondary prevention with blood pressure control and statin therapy.  I will check lipids and LFTs today as he has been fasting.  He denies any recurrent chest pain at this time.  Continue clopidogrel.  2.  Hypertension: Blood pressure has been labile at home elevated today in the office but normally runs in the 130s to low 140s per his monitor which does correlate with our blood pressure cuff here in the office.  He is recently been changed off of HCTZ and onto chlorthalidone 25 mg daily along with losartan 100 mg daily.  On next follow-up if blood pressure remains elevated will likely need to adjust chlorthalidone or add a third agent.  3.  Hypercholesterolemia: Repeating fasting lipids and LFTs today.  Continue on atorvastatin 40 mg daily.  4.  Obstructive sleep apnea: Repeating split-night sleep study to allow for reinstitution of CPAP.  He is notified that we will follow-up with him once the report has come back for institution of sleep maintenance and CPAP.   Current medicines are reviewed at length with the patient today.    Labs/ tests ordered today include: BMET, lipids and LFTs, CBC, split-night sleep study. Phill Myron. [place], ANP, [facility]   11/03/2018 9:32 AM    [place] [address] Office [phone] Fax [phone]

## 2018-11-03 ENCOUNTER — Encounter: Payer: Self-pay | Admitting: Adult Health

## 2018-11-03 ENCOUNTER — Other Ambulatory Visit: Payer: Self-pay

## 2018-11-03 ENCOUNTER — Ambulatory Visit (INDEPENDENT_AMBULATORY_CARE_PROVIDER_SITE_OTHER): Payer: Self-pay | Admitting: Adult Health

## 2018-11-03 VITALS — BP 142/82 | HR 79 | Ht 76.0 in | Wt 299.4 lb

## 2018-11-03 DIAGNOSIS — G4733 Obstructive sleep apnea (adult) (pediatric): Secondary | ICD-10-CM

## 2018-11-03 DIAGNOSIS — I10 Essential (primary) hypertension: Secondary | ICD-10-CM

## 2018-11-03 DIAGNOSIS — Z79899 Other long term (current) drug therapy: Secondary | ICD-10-CM

## 2018-11-03 DIAGNOSIS — E7800 Pure hypercholesterolemia, unspecified: Secondary | ICD-10-CM

## 2018-11-03 DIAGNOSIS — I2510 Atherosclerotic heart disease of native coronary artery without angina pectoris: Secondary | ICD-10-CM

## 2018-11-03 NOTE — Patient Instructions (Signed)
Medication Instructions:  The current medical regimen is effective;  continue present plan and medications as directed. Please refer to the Current Medication list given to you today. If you need a refill on your cardiac medications before your next appointment, please call your pharmacy.  Labwork: A1C,BMET,CBC,FLP AND LFT TODAY HERE IN OUR OFFICE AT [facility]   Take the provided lab slips with you to the lab for your blood draw.   When you have your labs (blood work) drawn today and your tests are completely normal, you will receive your results only by MyChart Message (if you have MyChart) -OR-  A paper copy in the mail.  If you have any lab test that is abnormal or we need to change your treatment, we will call you to review these results.  Testing/Procedures: Your physician has recommended that you have a sleep study. This test records several body functions during sleep, including: brain activity, eye movement, oxygen and carbon dioxide blood levels, heart rate and rhythm, breathing rate and rhythm, the flow of air through your mouth and nose, snoring, body muscle movements, and chest and belly movement.  Follow-Up: You will need a follow up appointment in:  6 months.  Please call our office 2 months in advance to schedule this appointment.  You may see Dorris Carnes, MD , Jory Sims, DNP, AACC or one of the following Advanced Practice Providers on your designated Care Team:  Richardson Dopp, PA-C  [place], [place]  Daune Perch, NP       At [facility], you and your health needs are our priority.  As part of our continuing mission to provide you with exceptional heart care, we have created designated Provider Care Teams.  These Care Teams include your primary Cardiologist (physician) and Advanced Practice Providers (APPs -  Physician Assistants and Nurse Practitioners) who all work together to provide you with the care you need, when you need it.  Thank you for choosing [facility] at  [facility]!!

## 2018-11-04 LAB — BASIC METABOLIC PANEL
BUN/Creatinine Ratio: 13 (ref 9–20)
BUN: 14 mg/dL (ref 6–24)
CO2: 18 mmol/L — ABNORMAL LOW (ref 20–29)
Calcium: 9.3 mg/dL (ref 8.7–10.2)
Chloride: 103 mmol/L (ref 96–106)
Creatinine, Ser: 1.1 mg/dL (ref 0.76–1.27)
GFR calc Af Amer: 85 mL/min/{1.73_m2} (ref >59)
GFR calc non Af Amer: 74 mL/min/{1.73_m2} (ref >59)
Glucose: 145 mg/dL — ABNORMAL HIGH (ref 65–99)
Potassium: 4 mmol/L (ref 3.5–5.2)
Sodium: 135 mmol/L (ref 134–144)

## 2018-11-04 LAB — HEPATIC FUNCTION PANEL
ALT: 20 IU/L (ref 0–44)
AST: 15 IU/L (ref 0–40)
Albumin: 4.4 g/dL (ref 3.8–4.9)
Alkaline Phosphatase: 85 IU/L (ref 39–117)
Bilirubin Total: 0.6 mg/dL (ref 0.0–1.2)
Bilirubin, Direct: 0.18 mg/dL (ref 0.00–0.40)
Total Protein: 6.5 g/dL (ref 6.0–8.5)

## 2018-11-04 LAB — LIPID PANEL
Chol/HDL Ratio: 3.8 ratio (ref 0.0–5.0)
Cholesterol, Total: 159 mg/dL (ref 100–199)
HDL: 42 mg/dL (ref >39)
LDL Calculated: 79 mg/dL (ref 0–99)
Triglycerides: 191 mg/dL — ABNORMAL HIGH (ref 0–149)
VLDL Cholesterol Cal: 38 mg/dL (ref 5–40)

## 2018-11-04 LAB — HEMOGLOBIN A1C
Est. average glucose Bld gHb Est-mCnc: 148 mg/dL
Hgb A1c MFr Bld: 6.8 % — ABNORMAL HIGH (ref 4.8–5.6)

## 2018-11-04 LAB — CBC
Hematocrit: 38.9 % (ref 37.5–51.0)
Hemoglobin: 13.3 g/dL (ref 13.0–17.7)
MCH: 32.9 pg (ref 26.6–33.0)
MCHC: 34.2 g/dL (ref 31.5–35.7)
MCV: 96 fL (ref 79–97)
Platelets: 166 10*3/uL (ref 150–450)
RBC: 4.04 x10E6/uL — ABNORMAL LOW (ref 4.14–5.80)
RDW: 13.3 % (ref 11.6–15.4)
WBC: 6.4 10*3/uL (ref 3.4–10.8)

## 2018-11-10 MED FILL — CLOPIDOGREL 75 MG TABLET: 75 | 30 days supply | Qty: 30 | Fill #5

## 2018-11-10 MED FILL — ATORVASTATIN CALCIUM 40 MG: 40 | 30 days supply | Qty: 30 | Fill #5

## 2018-11-15 ENCOUNTER — Telehealth: Payer: Self-pay | Admitting: *Deleted

## 2018-11-15 NOTE — Telephone Encounter (Signed)
-----   Message from Waylan Rocher, LPN sent at 1/61/0960  3:01 PM EDT ----- Regarding: RE: [place]? ----- Message ----- From: Lauralee Evener, CMA Sent: 11/03/2018   2:48 PM EDT To: Waylan Rocher, LPN Subject: RE: SLEEP STUDY                                The chart is not showing any insurance to pre cert. If this is the case there's nothing for me to do. Ok to  just call and schedule. [phone]. ----- Message ----- From: Waylan Rocher, LPN Sent: 4/78/2956   9:27 AM EDT To: Waylan Rocher, LPN, Cv Div Sleep Studies Subject: SLEEP STUDY                                    PT HAS AN ORDER ALREADY IN THE SYSTEM FOR HIS SLEEP STUDY. PLEASE CALL AND SCHEDULE. LET ME KNOW IF THE ORDER HAS EXPIRED.  [place]

## 2018-11-15 NOTE — Telephone Encounter (Signed)
Staff message sent to William Young patient has no insurance listed in chart. Ok to schedule sleep study.

## 2018-11-22 ENCOUNTER — Telehealth: Payer: Self-pay | Admitting: *Deleted

## 2018-11-22 NOTE — Telephone Encounter (Signed)
-----   Message from Lauralee Evener, CMA sent at 11/15/2018  2:09 PM EDT ----- Regarding: RE: SLEEP STUDY Per the chart patient has no insurance. Ok to schedule. Nothing to pre cert. ----- Message ----- From: Waylan Rocher, LPN Sent: 0/62/3762   3:01 PM EDT To: Lauralee Evener, CMA Subject: RE: SLEEP STUDY                                CALL WHERE? ----- Message ----- From: Lauralee Evener, CMA Sent: 11/03/2018   2:48 PM EDT To: Waylan Rocher, LPN Subject: RE: SLEEP STUDY                                The chart is not showing any insurance to pre cert. If this is the case there's nothing for me to do. Ok to  just call and schedule. [phone]. ----- Message ----- From: Waylan Rocher, LPN Sent: 7/37/1062   9:27 AM EDT To: Waylan Rocher, LPN, Cv [facility] Sleep Studies Subject: SLEEP STUDY                                    PT HAS AN ORDER ALREADY IN THE SYSTEM FOR HIS SLEEP STUDY. PLEASE CALL AND SCHEDULE. LET ME KNOW IF THE ORDER HAS EXPIRED.  [place]

## 2018-11-22 NOTE — Telephone Encounter (Signed)
Per DPR [name] called back to say he was trying to get some insurance since his job opened back up but it be 3 months before this happens. He will contact our office when his insurance is re-instated.

## 2018-11-25 ENCOUNTER — Telehealth: Payer: Self-pay | Admitting: *Deleted

## 2018-11-25 NOTE — Telephone Encounter (Signed)
-----   Message from Lauralee Evener, [place] sent at 11/25/2018  8:08 AM EDT ----- Regarding: RE: SLEEP STUDY Just make a note in his chart and resend the order if he wants to wait. Notify ordering provider patient wants to wait until he gets insurance. ----- Message ----- From: Freada Bergeron, CMA Sent: 11/22/2018   5:12 PM EDT To: Lauralee Evener, CMA, Waylan Rocher, LPN Subject: RE: SLEEP STUDY                                Yes, He told me he was trying to get some insurance since his job opened back up but it be 3 months before this happens. He will contact our office when his insurance is re-instated. Thanks  ----- Message ----- From: Lauralee Evener, CMA Sent: 11/21/2018   5:15 PM EDT To: Freada Bergeron, CMA Subject: RE: [place] did you call this patient? ----- Message ----- From: Waylan Rocher, LPN Sent: 3/71/0626   9:07 AM EDT To: Lauralee Evener, CMA Subject: FW: SLEEP STUDY                                Did you call pt to schedule sleep study? ----- Message ----- From: Waylan Rocher, LPN Sent: 9/48/5462   9:27 AM EDT To: Waylan Rocher, LPN, Cv Div Sleep Studies Subject: SLEEP STUDY                                    PT HAS AN ORDER ALREADY IN THE SYSTEM FOR HIS SLEEP STUDY. PLEASE CALL AND SCHEDULE. LET ME KNOW IF THE ORDER HAS EXPIRED.  [place]

## 2018-11-28 MED FILL — CHLORTHALIDONE 25 MG TAB: 25 | 30 days supply | Qty: 30 | Fill #1

## 2018-11-28 MED FILL — LOSARTAN POTASSIUM 100 MG T: 100 | 30 days supply | Qty: 30 | Fill #3

## 2018-12-12 MED FILL — CLOPIDOGREL 75 MG TABLET: 75 | 30 days supply | Qty: 30 | Fill #6

## 2018-12-12 MED FILL — ATORVASTATIN CALCIUM 40 MG: 40 | 30 days supply | Qty: 30 | Fill #6

## 2018-12-28 MED FILL — CHLORTHALIDONE 25 MG TAB: 25 | 30 days supply | Qty: 30 | Fill #2

## 2018-12-28 MED FILL — LOSARTAN POTASSIUM 100 MG T: 100 | 30 days supply | Qty: 30 | Fill #4

## 2019-01-11 MED FILL — CLOPIDOGREL 75 MG TABLET: 75 | 30 days supply | Qty: 30 | Fill #7

## 2019-01-11 MED FILL — ATORVASTATIN CALCIUM 40 MG: 40 | 30 days supply | Qty: 30 | Fill #7

## 2019-01-30 MED FILL — LOSARTAN POTASSIUM 100 MG T: 100 | 30 days supply | Qty: 30 | Fill #5

## 2019-01-30 MED FILL — CHLORTHALIDONE 25 MG TAB: 25 | 30 days supply | Qty: 30 | Fill #3

## 2019-02-13 MED FILL — CLOPIDOGREL 75 MG TABLET: 75 | 30 days supply | Qty: 30 | Fill #8

## 2019-02-13 MED FILL — ATORVASTATIN CALCIUM 40 MG: 40 | 30 days supply | Qty: 30 | Fill #8

## 2019-02-28 MED FILL — LOSARTAN POTASSIUM 100 MG T: 100 | 30 days supply | Qty: 30 | Fill #6

## 2019-02-28 MED FILL — CHLORTHALIDONE 25 MG TAB: 25 | 30 days supply | Qty: 30 | Fill #4

## 2019-03-14 MED FILL — CLOPIDOGREL 75 MG TABLET: 75 | 30 days supply | Qty: 30 | Fill #9

## 2019-03-14 MED FILL — ATORVASTATIN CALCIUM 40 MG: 40 | 30 days supply | Qty: 30 | Fill #9

## 2019-03-30 MED FILL — CHLORTHALIDONE 25 MG TAB: 25 | 30 days supply | Qty: 30 | Fill #5

## 2019-03-31 ENCOUNTER — Telehealth: Payer: Self-pay | Admitting: Internal Medicine

## 2019-03-31 ENCOUNTER — Other Ambulatory Visit: Payer: Self-pay | Admitting: Internal Medicine

## 2019-03-31 NOTE — Telephone Encounter (Signed)
New Message   *STAT* If patient is at the pharmacy, call can be transferred to refill team.   1. Which medications need to be refilled? (please list name of each medication and dose if known) losartan (COZAAR) 100 MG tablet  2. Which pharmacy/location (including street and city if local pharmacy) is medication to be sent to? [place], [place]  3. Do they need a 30 day or 90 day supply? 30 day

## 2019-04-03 MED FILL — LOSARTAN POTASSIUM 100 MG T: 100 | 30 days supply | Qty: 30 | Fill #0

## 2019-04-03 NOTE — Telephone Encounter (Signed)
Pt's medication has already been sent to pt's pharmacy as requested. Confirmation received.  

## 2019-04-13 MED FILL — ATORVASTATIN CALCIUM 40 MG: 40 | 30 days supply | Qty: 30 | Fill #10

## 2019-04-13 MED FILL — CLOPIDOGREL 75 MG TABLET: 75 | 30 days supply | Qty: 30 | Fill #10

## 2019-05-01 MED FILL — CHLORTHALIDONE 25 MG TAB: 25 | 30 days supply | Qty: 30 | Fill #6

## 2019-05-03 MED FILL — LOSARTAN POTASSIUM 100 MG T: 100 | 30 days supply | Qty: 30 | Fill #1

## 2019-05-15 MED FILL — ATORVASTATIN CALCIUM 40 MG: 40 | 30 days supply | Qty: 30 | Fill #11

## 2019-05-15 MED FILL — CLOPIDOGREL 75 MG TABLET: 75 | 30 days supply | Qty: 30 | Fill #11

## 2019-05-29 MED FILL — CHLORTHALIDONE 25 MG TAB: 25 | 30 days supply | Qty: 30 | Fill #7

## 2019-05-31 MED FILL — LOSARTAN POTASSIUM 100 MG T: 100 | 30 days supply | Qty: 30 | Fill #2

## 2019-05-31 NOTE — Progress Notes (Signed)
Cardiology Office Note   Date:  06/02/2019   ID:  William Young, DOB [birth year], MRN [id number]  PCP:  Gildardo Pounds, NP  Cardiologist:   Dorris Carnes, MD   F/U of CAD    History of Present Illness: William Young is a 59 y.o. male with a history of CAD, DM, GERD, HTN, HL In 09/2014 he had a NSTEMI  LHC done which showed single vessel CAD with 90% prox RCA and 80^ R PLSA.  Underwent PCI/BMS to prox RCA and resolute DES to RPLB3.  Hx of ASA allergy so treated  With Brilinta for 1 year then continued on plavix.    Pt admitted in Nov 2018 with CP  Had run out of meds   All resumed  R/O for MI Myovue post discharge showed no ischemia   LVEF 38%  Echo showed LVEF 55 to 60^ with inferolateral hypokinesis I saw the pt in 2019    Pt denies CP   No SOB   Dizzy yesterday  Comes and goes   Does not take BP regularly  Having R leg pain  He has had  venous problems R leg     Current Meds  Medication Sig  . acetaminophen (TYLENOL) 325 MG tablet Take 650 mg every 6 (six) hours as needed by mouth for mild pain.  Marland Kitchen atorvastatin (LIPITOR) 40 MG tablet Take 1 tablet (40 mg total) by mouth daily.  . chlorthalidone (HYGROTON) 25 MG tablet TAKE 1 TABLET BY MOUTH DAILY. TO REPLACE HCTZ  . clopidogrel (PLAVIX) 75 MG tablet Take 1 tablet (75 mg total) by mouth daily.  Marland Kitchen losartan (COZAAR) 100 MG tablet TAKE 1 TABLET (100 MG TOTAL) BY MOUTH DAILY.  . nitroGLYCERIN (NITROSTAT) 0.4 MG SL tablet Place 1 tablet (0.4 mg total) every 5 (five) minutes x 3 doses as needed under the tongue for chest pain.     Allergies:   Aspirin and Ibuprofen   Past Medical History:  Diagnosis Date  . CAD (coronary artery disease) 09/2014   a. NSTEMI >> LHC 5/16:  mLAD 15, pRCA 90, mRCA 40, RPLB3 80, EF normal with inf HK >> PCI:  BMS to pRCA and DES to RPLB3   . GERD (gastroesophageal reflux disease)   . History of echocardiogram    Echo 6/16:  EF 50-55%, no RWMA, Gr 1 DD  . HLD (hyperlipidemia)    Archie Endo 03/15/2017  .  HTN (hypertension)   . Hx of NSTEMI 5/16 tx with BMS to [facility] and DES to [facility] 09/10/2014  . Hyperglycemia   . OSA (obstructive sleep apnea)    "dx'd; couldn't tolerate mask" (03/15/2017)  . Tobacco use   . Type II diabetes mellitus ([place])   . Varicose veins of both lower extremities    S/P ablation 02/2017    Past Surgical History:  Procedure Laterality Date  . CARDIAC CATHETERIZATION N/A 09/11/2014   Procedure: Left Heart Cath and Coronary Angiography;  Surgeon: Leonie Man, MD;  Location: [facility] INVASIVE CV LAB CUPID;  Service: Cardiovascular;  Laterality: N/A;  . CARDIAC CATHETERIZATION  09/11/2014   Procedure: Coronary Stent Intervention;  Surgeon: Leonie Man, MD;  Location: [facility] INVASIVE CV LAB CUPID;  Service: Cardiovascular;;  . ENDOVENOUS ABLATION SAPHENOUS VEIN W/ LASER Right 02/22/2017   endovenous laser ablation R GSV and stab phlebectomy >20 incisions R leg by Tinnie Gens MD      Social History:  The patient  reports that he has been smoking cigarettes. He  has a 40.00 pack-year smoking history. He has quit using smokeless tobacco.  His smokeless tobacco use included chew. He reports current alcohol use of about 6.0 standard drinks of alcohol per week. He reports that he does not use drugs.   Family History:  The patient's family history includes Cancer in his maternal grandfather; Heart attack in his maternal grandmother, maternal uncle, and paternal grandmother.    ROS:  Please see the history of present illness. All other systems are reviewed and  Negative to the above problem except as noted.    PHYSICAL EXAM: VS:  BP 134/80   Pulse 77   Ht 6\' 4"  (1.93 m)   Wt 296 lb 12.8 oz (134.6 kg)   BMI 36.13 kg/m   GEN: Morbidly obese 59 yo in no acute distress  HEENT: normal  Neck:  JVP normal  Cardiac: RRR; no murmurs, rubs, or gallops,Trace  edema   Respiratory:  clear to auscultation bilaterally, normal work of breathing GI: soft, nontender, nondistended, + BS  No  hepatomegaly  MS: no deformity Moving all extremities    R leg tender   Skin: warm and dry, no rash  Mild varicosities R leg   Neuro:  Strength and sensation are intact Psych: euthymic mood, full affect   EKG:  EKG is not ordered today.  SR 77 bpm      Lipid Panel    Component Value Date/Time   CHOL 159 11/03/2018 0939   TRIG 191 (H) 11/03/2018 0939   HDL 42 11/03/2018 0939   CHOLHDL 3.8 11/03/2018 0939   CHOLHDL 6.8 03/16/2017 0439   VLDL 33 03/16/2017 0439   LDLCALC 79 11/03/2018 0939      Wt Readings from Last 3 Encounters:  06/02/19 296 lb 12.8 oz (134.6 kg)  11/03/18 299 lb 6.4 oz (135.8 kg)  05/27/18 290 lb 6.4 oz (131.7 kg)      ASSESSMENT AND PLAN:  1  CAD  No symtpoms to suggest active angina    2  HTN  BP is fairly well controlled on current regimen  Continue   Check labs    3  HL   Last lipids LDL 79  HDL 42  Continue meds   Watch diet  Has been better before   4  Hx mild diastolic CHF  Current volume status looks OK  5   Hx LE DVT  t with continued pain   Some swelling  Will get LE venous doppler to evaluate   6  Hx tob abuse  COntinues to smoke about 2ppd   Counselled on cutting back more     F/U in October   Current medicines are reviewed at length with the patient today.  The patient does not have concerns regarding medicines.  Signed, November, MD  06/02/2019 11:22 AM    [facility] [address] [place], [place], [place]  [place] Phone: [phone]; Fax: [phone]

## 2019-06-02 ENCOUNTER — Other Ambulatory Visit: Payer: Self-pay

## 2019-06-02 ENCOUNTER — Ambulatory Visit (INDEPENDENT_AMBULATORY_CARE_PROVIDER_SITE_OTHER): Payer: Self-pay | Admitting: Internal Medicine

## 2019-06-02 ENCOUNTER — Encounter: Payer: Self-pay | Admitting: Internal Medicine

## 2019-06-02 VITALS — BP 134/80 | HR 77 | Ht 76.0 in | Wt 296.8 lb

## 2019-06-02 DIAGNOSIS — I2510 Atherosclerotic heart disease of native coronary artery without angina pectoris: Secondary | ICD-10-CM

## 2019-06-02 DIAGNOSIS — M7989 Other specified soft tissue disorders: Secondary | ICD-10-CM

## 2019-06-02 DIAGNOSIS — M79661 Pain in right lower leg: Secondary | ICD-10-CM

## 2019-06-02 LAB — BASIC METABOLIC PANEL
BUN/Creatinine Ratio: 16 (ref 9–20)
BUN: 17 mg/dL (ref 6–24)
CO2: 20 mmol/L (ref 20–29)
Calcium: 9.2 mg/dL (ref 8.7–10.2)
Chloride: 102 mmol/L (ref 96–106)
Creatinine, Ser: 1.09 mg/dL (ref 0.76–1.27)
GFR calc Af Amer: 86 mL/min/{1.73_m2} (ref >59)
GFR calc non Af Amer: 74 mL/min/{1.73_m2} (ref >59)
Glucose: 198 mg/dL — ABNORMAL HIGH (ref 65–99)
Potassium: 4.5 mmol/L (ref 3.5–5.2)
Sodium: 136 mmol/L (ref 134–144)

## 2019-06-02 LAB — CBC
Hematocrit: 39.5 % (ref 37.5–51.0)
Hemoglobin: 13.7 g/dL (ref 13.0–17.7)
MCH: 32.3 pg (ref 26.6–33.0)
MCHC: 34.7 g/dL (ref 31.5–35.7)
MCV: 93 fL (ref 79–97)
Platelets: 178 10*3/uL (ref 150–450)
RBC: 4.24 x10E6/uL (ref 4.14–5.80)
RDW: 12.9 % (ref 11.6–15.4)
WBC: 6.7 10*3/uL (ref 3.4–10.8)

## 2019-06-02 NOTE — Patient Instructions (Signed)
Medication Instructions:  No changes *If you need a refill on your cardiac medications before your next appointment, please call your pharmacy*  Lab Work: Today: cbc, bmet If you have labs (blood work) drawn today and your tests are completely normal, you will receive your results only by: Marland Kitchen MyChart Message (if you have MyChart) OR . A paper copy in the mail If you have any lab test that is abnormal or we need to change your treatment, we will call you to review the results.  Testing/Procedures: Your physician has requested that you have a lower extremity venous duplex. This test is an ultrasound of the veins in the legs. It looks at venous blood flow that carries blood from the heart to the legs. Allow one hour for a Lower Venous exam. There are no restrictions or special instructions.  Follow-Up: At [facility], you and your health needs are our priority.  As part of our continuing mission to provide you with exceptional heart care, we have created designated Provider Care Teams.  These Care Teams include your primary Cardiologist (physician) and Advanced Practice Providers (APPs -  Physician Assistants and Nurse Practitioners) who all work together to provide you with the care you need, when you need it.  Your next appointment:   10 month(s)  The format for your next appointment:   In Person  Provider:   You may see Dietrich Pates, MD or one of the following Advanced Practice Providers on your designated Care Team:    [name], PA-C  Vin [place], [place]  Berton Bon, NP   Other Instructions

## 2019-06-05 ENCOUNTER — Other Ambulatory Visit: Payer: Self-pay

## 2019-06-05 ENCOUNTER — Ambulatory Visit (HOSPITAL_COMMUNITY)
Admission: RE | Admit: 2019-06-05 | Discharge: 2019-06-05 | Disposition: A | Discharge status: Home / Self Care | Payer: Self-pay | Source: Ambulatory Visit | Attending: Cardiology | Admitting: Cardiology

## 2019-06-05 DIAGNOSIS — M7989 Other specified soft tissue disorders: Secondary | ICD-10-CM | POA: Insufficient documentation

## 2019-06-05 DIAGNOSIS — M79661 Pain in right lower leg: Secondary | ICD-10-CM | POA: Insufficient documentation

## 2019-06-13 ENCOUNTER — Other Ambulatory Visit: Payer: Self-pay | Admitting: Physician Assistant

## 2019-06-14 MED FILL — CLOPIDOGREL 75 MG TABLET: 75 | 30 days supply | Qty: 30 | Fill #0

## 2019-06-14 MED FILL — ATORVASTATIN CALCIUM 40 MG: 40 | 30 days supply | Qty: 30 | Fill #0

## 2019-06-27 MED FILL — LOSARTAN POTASSIUM 100 MG T: 100 | 30 days supply | Qty: 30 | Fill #3

## 2019-06-27 MED FILL — CHLORTHALIDONE 25 MG TAB: 25 | 30 days supply | Qty: 30 | Fill #8

## 2019-07-12 MED FILL — CLOPIDOGREL 75 MG TABLET: 75 | 30 days supply | Qty: 30 | Fill #1

## 2019-07-12 MED FILL — ATORVASTATIN CALCIUM 40 MG: 40 | 30 days supply | Qty: 30 | Fill #1

## 2019-07-27 ENCOUNTER — Other Ambulatory Visit: Payer: Self-pay | Admitting: Internal Medicine

## 2019-07-27 MED FILL — CHLORTHALIDONE 25 MG TAB: 25 | 30 days supply | Qty: 30 | Fill #0

## 2019-07-31 MED FILL — LOSARTAN POTASSIUM 100 MG T: 100 | 30 days supply | Qty: 30 | Fill #4

## 2019-08-04 ENCOUNTER — Ambulatory Visit: Payer: Self-pay | Attending: Internal Medicine

## 2019-08-04 DIAGNOSIS — Z23 Encounter for immunization: Secondary | ICD-10-CM

## 2019-08-04 NOTE — Progress Notes (Signed)
   Covid-19 Vaccination Clinic  Name:  Olaoluwa Grieder    MRN: [id number] DOB: 1961-02-23  08/04/2019  Mr. Staples was observed post Covid-19 immunization for 15 minutes without incident. He was provided with Vaccine Information Sheet and instruction to access the V-Safe system.   Mr. Givler was instructed to call 911 with any severe reactions post vaccine: Marland Kitchen Difficulty breathing  . Swelling of face and throat  . A fast heartbeat  . A bad rash all over body  . Dizziness and weakness   Immunizations Administered    Name Date Dose VIS Date Route   Pfizer COVID-19 Vaccine 08/04/2019 12:35 PM 0.3 mL 04/21/2019 Intramuscular   Manufacturer: [employer], [employer]   Lot: [id number]   NDC: [address]-5

## 2019-08-14 MED FILL — ATORVASTATIN CALCIUM 40 MG: 40 | 30 days supply | Qty: 30 | Fill #2

## 2019-08-14 MED FILL — CLOPIDOGREL 75 MG TABLET: 75 | 30 days supply | Qty: 30 | Fill #2

## 2019-08-28 ENCOUNTER — Ambulatory Visit: Payer: Self-pay | Attending: Internal Medicine

## 2019-08-28 DIAGNOSIS — Z23 Encounter for immunization: Secondary | ICD-10-CM

## 2019-08-28 MED FILL — LOSARTAN POTASSIUM 100 MG T: 100 | 30 days supply | Qty: 30 | Fill #5

## 2019-08-28 MED FILL — CHLORTHALIDONE 25 MG TAB: 25 | 30 days supply | Qty: 30 | Fill #1

## 2019-08-28 NOTE — Progress Notes (Signed)
   Covid-19 Vaccination Clinic  Name:  [name]    MRN: [id number] DOB: Nov 26, 1960  08/28/2019  Mr. Biller was observed post Covid-19 immunization for 15 minutes without incident. He was provided with Vaccine Information Sheet and instruction to access the V-Safe system.   Mr. Fini was instructed to call 911 with any severe reactions post vaccine: Marland Kitchen Difficulty breathing  . Swelling of face and throat  . A fast heartbeat  . A bad rash all over body  . Dizziness and weakness   Immunizations Administered    Name Date Dose VIS Date Route   Pfizer COVID-19 Vaccine 08/28/2019 11:04 AM 0.3 mL 07/05/2018 Intramuscular   Manufacturer: [employer], [employer]   Lot: [id number]   NDC: [address]-0

## 2019-09-12 MED FILL — CLOPIDOGREL 75 MG TABLET: 75 | 30 days supply | Qty: 30 | Fill #3

## 2019-09-12 MED FILL — ATORVASTATIN CALCIUM 40 MG: 40 | 30 days supply | Qty: 30 | Fill #3

## 2019-09-26 MED FILL — CHLORTHALIDONE 25 MG TAB: 25 | 30 days supply | Qty: 30 | Fill #2

## 2019-10-02 ENCOUNTER — Other Ambulatory Visit: Payer: Self-pay | Admitting: Internal Medicine

## 2019-10-02 MED FILL — LOSARTAN POTASSIUM 100 MG T: 100 | 30 days supply | Qty: 30 | Fill #0

## 2019-10-12 MED FILL — ?ATORVASTATIN 40MG TABLET: 40 | 30 days supply | Qty: 30 | Fill #4

## 2019-10-12 MED FILL — ?CLOPIDOGREL 75MG TA: 75 | 30 days supply | Qty: 30 | Fill #4

## 2019-10-30 MED FILL — LOSARTAN POTASSIUM 100 MG T: 100 | 30 days supply | Qty: 30 | Fill #1

## 2019-10-30 MED FILL — ?CHLORTHALIDONE 25 MG TABLE: 25 | 30 days supply | Qty: 30 | Fill #3

## 2019-11-08 MED FILL — ?ATORVASTATIN 40MG TABLET: 40 | 30 days supply | Qty: 30 | Fill #5

## 2019-11-08 MED FILL — ?CLOPIDOGREL 75MG TA: 75 | 30 days supply | Qty: 30 | Fill #5

## 2019-11-28 MED FILL — ?CHLORTHALIDONE 25 MG TABLE: 25 | 30 days supply | Qty: 30 | Fill #4

## 2019-11-28 MED FILL — LOSARTAN POTASSIUM 100 MG T: 100 | 30 days supply | Qty: 30 | Fill #2

## 2019-12-12 MED FILL — ?CLOPIDOGREL 75MG TA: 75 | 30 days supply | Qty: 30 | Fill #6

## 2019-12-12 MED FILL — ATORVASTATIN CALCIUM 40 MG: 40 | 30 days supply | Qty: 30 | Fill #6

## 2019-12-28 MED FILL — LOSARTAN POTASSIUM 100 MG T: 100 | 30 days supply | Qty: 30 | Fill #3

## 2019-12-28 MED FILL — ?CHLORTHALIDONE 25 MG TABLE: 25 | 30 days supply | Qty: 30 | Fill #5

## 2020-01-10 MED FILL — ?ATORVASTATIN 40MG TABLET: 40 | 30 days supply | Qty: 30 | Fill #7

## 2020-01-10 MED FILL — ?CLOPIDOGREL 75MG TA: 75 | 30 days supply | Qty: 30 | Fill #7

## 2020-01-29 MED FILL — LOSARTAN POTASSIUM 100 MG T: 100 | 30 days supply | Qty: 30 | Fill #4

## 2020-01-29 MED FILL — ?CHLORTHALIDONE 25 MG TABLE: 25 | 30 days supply | Qty: 30 | Fill #6

## 2020-02-12 MED FILL — ?ATORVASTATIN 40MG TABLET: 40 | 30 days supply | Qty: 30 | Fill #8

## 2020-02-12 MED FILL — ?CLOPIDOGREL 75MG TA: 75 | 30 days supply | Qty: 30 | Fill #8

## 2020-02-26 ENCOUNTER — Other Ambulatory Visit: Payer: Self-pay | Admitting: Internal Medicine

## 2020-02-26 MED FILL — LOSARTAN POTASSIUM 100 MG T: 100 | 30 days supply | Qty: 30 | Fill #5

## 2020-02-27 MED FILL — ?CHLORTHALIDONE 25 MG TABLE: 25 | 30 days supply | Qty: 30 | Fill #0

## 2020-03-11 MED FILL — ?ATORVASTATIN 40MG TABLET: 40 | 30 days supply | Qty: 30 | Fill #9

## 2020-03-11 MED FILL — ?CLOPIDOGREL 75MG TA: 75 | 30 days supply | Qty: 30 | Fill #9

## 2020-03-28 NOTE — Progress Notes (Signed)
Cardiology Office Note   Date:  03/29/2020   ID:  William [name], DOB Jul 17, 1960, MRN [id number]  PCP:  Claiborne Rigg, NP  Cardiologist:   Dietrich Pates, MD   F/U of CAD    History of Present Illness: William [name] is a 59 y.o. male with a history of CAD, DM, GERD, HTN, HL In 09/2014 he had a NSTEMI  LHC done which showed single vessel CAD with 90% prox RCA and 80^ R PLSA.  Underwent PCI/BMS to prox RCA and resolute DES to RPLB3.  Hx of ASA allergy so treated  With Brilinta for 1 year then continued on plavix.    Pt admitted in Nov 2018 with CP  Myovue post discharge showed no ischemia   LVEF 38%  Echo showed LVEF 55 to 60^ with inferolateral hypokinesis I saw the pt in Jan 2021 Pt says he has been had chest tightness  Not every [name]   DId have this am      Pt is getting more SOB with doing things  Even with mild acitity    Getting tightness a couple times  per week  Good days and bad days   Feels like what happened prior cath in 2016  Current Meds  Medication Sig  . acetaminophen (TYLENOL) 325 MG tablet Take 650 mg every 6 (six) hours as needed by mouth for mild pain.  Marland Kitchen atorvastatin (LIPITOR) 40 MG tablet TAKE 1 TABLET (40 MG TOTAL) BY MOUTH DAILY.  . chlorthalidone (HYGROTON) 25 MG tablet TAKE 1 TABLET BY MOUTH DAILY. TO REPLACE HCTZ  . clopidogrel (PLAVIX) 75 MG tablet TAKE 1 TABLET (75 MG TOTAL) BY MOUTH DAILY.  Marland Kitchen losartan (COZAAR) 100 MG tablet TAKE 1 TABLET (100 MG TOTAL) BY MOUTH DAILY.  . nitroGLYCERIN (NITROSTAT) 0.4 MG SL tablet Place 1 tablet (0.4 mg total) every 5 (five) minutes x 3 doses as needed under the tongue for chest pain.     Allergies:   Aspirin and Ibuprofen   Past Medical History:  Diagnosis Date  . CAD (coronary artery disease) 09/2014   a. NSTEMI >> LHC 5/16:  mLAD 15, pRCA 90, mRCA 40, RPLB3 80, EF normal with inf HK >> PCI:  BMS to pRCA and DES to RPLB3   . GERD (gastroesophageal reflux disease)   . History of echocardiogram    Echo 6/16:  EF  50-55%, no RWMA, Gr 1 DD  . HLD (hyperlipidemia)    Hattie Perch 03/15/2017  . HTN (hypertension)   . Hx of NSTEMI 5/16 tx with BMS to [facility] and DES to [facility] 09/10/2014  . Hyperglycemia   . OSA (obstructive sleep apnea)    "dx'd; couldn't tolerate mask" (03/15/2017)  . Tobacco use   . Type II diabetes mellitus ([facility])   . Varicose veins of both lower extremities    S/P ablation 02/2017    Past Surgical History:  Procedure Laterality Date  . CARDIAC CATHETERIZATION N/A 09/11/2014   Procedure: Left Heart Cath and Coronary Angiography;  Surgeon: Marykay Lex, MD;  Location: [facility] INVASIVE CV LAB CUPID;  Service: Cardiovascular;  Laterality: N/A;  . CARDIAC CATHETERIZATION  09/11/2014   Procedure: Coronary Stent Intervention;  Surgeon: Marykay Lex, MD;  Location: [facility] INVASIVE CV LAB CUPID;  Service: Cardiovascular;;  . ENDOVENOUS ABLATION SAPHENOUS VEIN W/ LASER Right 02/22/2017   endovenous laser ablation R GSV and stab phlebectomy >20 incisions R leg by Josephina Gip MD      Social History:  The patient  reports that he has been smoking cigarettes. He has a 40.00 pack-year smoking history. He has quit using smokeless tobacco.  His smokeless tobacco use included chew. He reports current alcohol use of about 6.0 standard drinks of alcohol per week. He reports that he does not use drugs.   Family History:  The patient's family history includes Cancer in his maternal grandfather; Heart attack in his maternal grandmother, maternal uncle, and paternal grandmother.    ROS:  Please see the history of present illness. All other systems are reviewed and  Negative to the above problem except as noted.    PHYSICAL EXAM: VS:  BP (!) 148/82   Pulse 79   Ht 6' 3.5" (1.918 m)   Wt (!) 304 lb 9.6 oz (138.2 kg)   SpO2 96%   BMI 37.57 kg/m   GEN: Morbidly obese 59 yo in no acute distress  HEENT: normal  Neck:  JVP normal  Cardiac: RRR; no murmurs   No LE  edema   Respiratory:  clear to auscultation  bilaterally, normal work of breathing GI: soft, nontender, nondistended, + BS  No hepatomegaly  MS: no deformity Moving all extremities    R leg tender   Skin: warm and dry, no rash  Mild varicosities R leg   Neuro:  Strength and sensation are intact Psych: euthymic mood, full affect   EKG:  EKG is ordered today.  SR 93 bpm   T wave inversion III   Lipid Panel    Component Value Date/Time   CHOL 159 11/03/2018 0939   TRIG 191 (H) 11/03/2018 0939   HDL 42 11/03/2018 0939   CHOLHDL 3.8 11/03/2018 0939   CHOLHDL 6.8 03/16/2017 0439   VLDL 33 03/16/2017 0439   LDLCALC 79 11/03/2018 0939      Wt Readings from Last 3 Encounters:  03/29/20 (!) 304 lb 9.6 oz (138.2 kg)  06/02/19 296 lb 12.8 oz (134.6 kg)  11/03/18 299 lb 6.4 oz (135.8 kg)      ASSESSMENT AND PLAN:  1  CAD the patient is having complaints that are concerning for progressive angina.  Again, similar to symptoms he had prior to his interventions in 2016.  I would recommend a left heart catheterization to reevaluate his anatomy.  He is agreeable to this understands risks.  We will go ahead and schedule.  Precath labs today.  2  HTN blood pressure is a little elevated.  We will continue to follow I would not make changes for right now until after the procedure.  3  HL   last lipids were back in 2028 LDL was 79.  We will go ahead and check lipids today.  4  Hx mild diastolic CHF  Current volume status looks OK  6  Hx tob abuse  COntinues to smoke  Has cut back   Encouraged him to continue to cut back and quit    Current medicines are reviewed at length with the patient today.  The patient does not have concerns regarding medicines.  Signed, Dietrich Pates, MD  03/29/2020 10:24 AM    [facility] [address] [place], [place], [place]  [address] Phone: [phone]; Fax: [phone]

## 2020-03-28 NOTE — H&P (View-Only) (Signed)
 Cardiology Office Note   Date:  03/29/2020   ID:  William [name], DOB 05/19/1960, MRN [id number]  PCP:  Fleming, Zelda W, NP  Cardiologist:   Nyala Kirchner, MD   F/U of CAD    History of Present Illness: William [name] is a 59 y.o. male with a history of CAD, DM, GERD, HTN, HL In 09/2014 he had a NSTEMI  LHC done which showed single vessel CAD with 90% prox RCA and 80^ R PLSA.  Underwent PCI/BMS to prox RCA and resolute DES to RPLB3.  Hx of ASA allergy so treated  With Brilinta for 1 year then continued on plavix.    Pt admitted in Nov 2018 with CP  Myovue post discharge showed no ischemia   LVEF 38%  Echo showed LVEF 55 to 60^ with inferolateral hypokinesis I saw the pt in Jan 2021 Pt says he has been had chest tightness  Not every day   DId have this am      Pt is getting more SOB with doing things  Even with mild acitity    Getting tightness a couple times  per week  Good days and bad days   Feels like what happened prior cath in 2016  Current Meds  Medication Sig  . acetaminophen (TYLENOL) 325 MG tablet Take 650 mg every 6 (six) hours as needed by mouth for mild pain.  . atorvastatin (LIPITOR) 40 MG tablet TAKE 1 TABLET (40 MG TOTAL) BY MOUTH DAILY.  . chlorthalidone (HYGROTON) 25 MG tablet TAKE 1 TABLET BY MOUTH DAILY. TO REPLACE HCTZ  . clopidogrel (PLAVIX) 75 MG tablet TAKE 1 TABLET (75 MG TOTAL) BY MOUTH DAILY.  . losartan (COZAAR) 100 MG tablet TAKE 1 TABLET (100 MG TOTAL) BY MOUTH DAILY.  . nitroGLYCERIN (NITROSTAT) 0.4 MG SL tablet Place 1 tablet (0.4 mg total) every 5 (five) minutes x 3 doses as needed under the tongue for chest pain.     Allergies:   Aspirin and Ibuprofen   Past Medical History:  Diagnosis Date  . CAD (coronary artery disease) 09/2014   a. NSTEMI >> LHC 5/16:  mLAD 15, pRCA 90, mRCA 40, RPLB3 80, EF normal with inf HK >> PCI:  BMS to pRCA and DES to RPLB3   . GERD (gastroesophageal reflux disease)   . History of echocardiogram    Echo 6/16:  EF  50-55%, no RWMA, Gr 1 DD  . HLD (hyperlipidemia)    /notes 03/15/2017  . HTN (hypertension)   . Hx of NSTEMI 5/16 tx with BMS to pRCA and DES to RPLB3 09/10/2014  . Hyperglycemia   . OSA (obstructive sleep apnea)    "dx'd; couldn't tolerate mask" (03/15/2017)  . Tobacco use   . Type II diabetes mellitus ([facility])   . Varicose veins of both lower extremities    S/P ablation 02/2017    Past Surgical History:  Procedure Laterality Date  . CARDIAC CATHETERIZATION N/A 09/11/2014   Procedure: Left Heart Cath and Coronary Angiography;  Surgeon: David W Harding, MD;  Location: [facility] INVASIVE CV LAB CUPID;  Service: Cardiovascular;  Laterality: N/A;  . CARDIAC CATHETERIZATION  09/11/2014   Procedure: Coronary Stent Intervention;  Surgeon: David W Harding, MD;  Location: [facility] INVASIVE CV LAB CUPID;  Service: Cardiovascular;;  . ENDOVENOUS ABLATION SAPHENOUS VEIN W/ LASER Right 02/22/2017   endovenous laser ablation R GSV and stab phlebectomy >20 incisions R leg by [name] MD      Social History:  The patient    reports that he has been smoking cigarettes. He has a 40.00 pack-year smoking history. He has quit using smokeless tobacco.  His smokeless tobacco use included chew. He reports current alcohol use of about 6.0 standard drinks of alcohol per week. He reports that he does not use drugs.   Family History:  The patient's family history includes Cancer in his maternal grandfather; Heart attack in his maternal grandmother, maternal uncle, and paternal grandmother.    ROS:  Please see the history of present illness. All other systems are reviewed and  Negative to the above problem except as noted.    PHYSICAL EXAM: VS:  BP (!) 148/82   Pulse 79   Ht 6' 3.5" (1.918 m)   Wt (!) 304 lb 9.6 oz (138.2 kg)   SpO2 96%   BMI 37.57 kg/m   GEN: Morbidly obese 59 yo in no acute distress  HEENT: normal  Neck:  JVP normal  Cardiac: RRR; no murmurs   No LE  edema   Respiratory:  clear to auscultation  bilaterally, normal work of breathing GI: soft, nontender, nondistended, + BS  No hepatomegaly  MS: no deformity Moving all extremities    R leg tender   Skin: warm and dry, no rash  Mild varicosities R leg   Neuro:  Strength and sensation are intact Psych: euthymic mood, full affect   EKG:  EKG is ordered today.  SR 93 bpm   T wave inversion III   Lipid Panel    Component Value Date/Time   CHOL 159 11/03/2018 0939   TRIG 191 (H) 11/03/2018 0939   HDL 42 11/03/2018 0939   CHOLHDL 3.8 11/03/2018 0939   CHOLHDL 6.8 03/16/2017 0439   VLDL 33 03/16/2017 0439   LDLCALC 79 11/03/2018 0939      Wt Readings from Last 3 Encounters:  03/29/20 (!) 304 lb 9.6 oz (138.2 kg)  06/02/19 296 lb 12.8 oz (134.6 kg)  11/03/18 299 lb 6.4 oz (135.8 kg)      ASSESSMENT AND PLAN:  1  CAD the patient is having complaints that are concerning for progressive angina.  Again, similar to symptoms he had prior to his interventions in 2016.  I would recommend a left heart catheterization to reevaluate his anatomy.  He is agreeable to this understands risks.  We will go ahead and schedule.  Precath labs today.  2  HTN blood pressure is a little elevated.  We will continue to follow I would not make changes for right now until after the procedure.  3  HL   last lipids were back in 2028 LDL was 79.  We will go ahead and check lipids today.  4  Hx mild diastolic CHF  Current volume status looks OK  6  Hx tob abuse  COntinues to smoke  Has cut back   Encouraged him to continue to cut back and quit    Current medicines are reviewed at length with the patient today.  The patient does not have concerns regarding medicines.  Signed, Dietrich Pates, MD  03/29/2020 10:24 AM    [facility] [address] [place], [place], [place]  [address] Phone: [phone]; Fax: [phone]

## 2020-03-29 ENCOUNTER — Other Ambulatory Visit: Payer: Self-pay | Admitting: Internal Medicine

## 2020-03-29 ENCOUNTER — Ambulatory Visit (INDEPENDENT_AMBULATORY_CARE_PROVIDER_SITE_OTHER): Payer: Self-pay | Admitting: Internal Medicine

## 2020-03-29 ENCOUNTER — Other Ambulatory Visit (HOSPITAL_COMMUNITY)
Admission: RE | Admit: 2020-03-29 | Discharge: 2020-03-29 | Disposition: A | Discharge status: Home / Self Care | Payer: Self-pay | Source: Ambulatory Visit | Attending: Cardiovascular Disease | Admitting: Cardiovascular Disease

## 2020-03-29 ENCOUNTER — Encounter: Payer: Self-pay | Admitting: *Deleted

## 2020-03-29 ENCOUNTER — Encounter: Payer: Self-pay | Admitting: Internal Medicine

## 2020-03-29 ENCOUNTER — Other Ambulatory Visit: Payer: Self-pay

## 2020-03-29 VITALS — BP 148/82 | HR 79 | Ht 75.5 in | Wt 304.6 lb

## 2020-03-29 DIAGNOSIS — Z01812 Encounter for preprocedural laboratory examination: Secondary | ICD-10-CM | POA: Insufficient documentation

## 2020-03-29 DIAGNOSIS — E782 Mixed hyperlipidemia: Secondary | ICD-10-CM

## 2020-03-29 DIAGNOSIS — Z20822 Contact with and (suspected) exposure to covid-19: Secondary | ICD-10-CM | POA: Insufficient documentation

## 2020-03-29 LAB — BASIC METABOLIC PANEL
BUN/Creatinine Ratio: 11 (ref 9–20)
BUN: 14 mg/dL (ref 6–24)
CO2: 23 mmol/L (ref 20–29)
Calcium: 9.3 mg/dL (ref 8.7–10.2)
Chloride: 98 mmol/L (ref 96–106)
Creatinine, Ser: 1.23 mg/dL (ref 0.76–1.27)
GFR calc Af Amer: 74 mL/min/{1.73_m2} (ref >59)
GFR calc non Af Amer: 64 mL/min/{1.73_m2} (ref >59)
Glucose: 270 mg/dL — ABNORMAL HIGH (ref 65–99)
Potassium: 4.7 mmol/L (ref 3.5–5.2)
Sodium: 136 mmol/L (ref 134–144)

## 2020-03-29 LAB — CBC
Hematocrit: 40.6 % (ref 37.5–51.0)
Hemoglobin: 14.1 g/dL (ref 13.0–17.7)
MCH: 32.3 pg (ref 26.6–33.0)
MCHC: 34.7 g/dL (ref 31.5–35.7)
MCV: 93 fL (ref 79–97)
Platelets: 176 10*3/uL (ref 150–450)
RBC: 4.37 x10E6/uL (ref 4.14–5.80)
RDW: 12.6 % (ref 11.6–15.4)
WBC: 8.1 10*3/uL (ref 3.4–10.8)

## 2020-03-29 LAB — LIPID PANEL
Chol/HDL Ratio: 4.9 ratio (ref 0.0–5.0)
Cholesterol, Total: 175 mg/dL (ref 100–199)
HDL: 36 mg/dL — ABNORMAL LOW (ref >39)
LDL Chol Calc (NIH): 82 mg/dL (ref 0–99)
Triglycerides: 347 mg/dL — ABNORMAL HIGH (ref 0–149)
VLDL Cholesterol Cal: 57 mg/dL — ABNORMAL HIGH (ref 5–40)

## 2020-03-29 LAB — SARS CORONAVIRUS 2 (TAT 6-24 HRS): SARS Coronavirus 2: NEGATIVE

## 2020-03-29 MED FILL — LOSARTAN POTASSIUM 100 MG T: 100 | 30 days supply | Qty: 30 | Fill #6

## 2020-03-29 MED FILL — CHLORTHALIDONE 25 MG TAB: 25 | 30 days supply | Qty: 30 | Fill #0

## 2020-03-29 NOTE — Patient Instructions (Signed)
Medication Instructions:  No changes *If you need a refill on your cardiac medications before your next appointment, please call your pharmacy*   Lab Work: Today: CBC, BMET If you have labs (blood work) drawn today and your tests are completely normal, you will receive your results only by: Marland Kitchen MyChart Message (if you have MyChart) OR . A paper copy in the mail If you have any lab test that is abnormal or we need to change your treatment, we will call you to review the results.   Testing/Procedures: Your physician has requested that you have a cardiac catheterization. Cardiac catheterization is used to diagnose and/or treat various heart conditions. Doctors may recommend this procedure for a number of different reasons. The most common reason is to evaluate chest pain. Chest pain can be a symptom of coronary artery disease (CAD), and cardiac catheterization can show whether plaque is narrowing or blocking your heart's arteries. This procedure is also used to evaluate the valves, as well as measure the blood flow and oxygen levels in different parts of your heart. For further information please visit [email or web address] Please follow instruction sheet, as given.   Follow-Up: At [facility], you and your health needs are our priority.  As part of our continuing mission to provide you with exceptional heart care, we have created designated Provider Care Teams.  These Care Teams include your primary Cardiologist (physician) and Advanced Practice Providers (APPs -  Physician Assistants and Nurse Practitioners) who all work together to provide you with the care you need, when you need it.  We recommend signing up for the patient portal called "MyChart".  Sign up information is provided on this After Visit Summary.  MyChart is used to connect with patients for Virtual Visits (Telemedicine).  Patients are able to view lab/test results, encounter notes, upcoming appointments, etc.  Non-urgent messages  can be sent to your provider as well.   To learn more about what you can do with MyChart, go to [email or web address].    Your next appointment:   3-4 week(s)  The format for your next appointment:   In Person  Provider:   You may see Dietrich Pates, MD or one of the following Advanced Practice Providers on your designated Care Team:    [name], PA-C  Chelsea Aus, [place]    Other Instructions

## 2020-04-01 ENCOUNTER — Other Ambulatory Visit: Payer: Self-pay

## 2020-04-01 ENCOUNTER — Ambulatory Visit (HOSPITAL_COMMUNITY)
Admission: RE | Disposition: A | Discharge status: Home / Self Care | Payer: Self-pay | Source: Home / Self Care | Attending: Cardiovascular Disease

## 2020-04-01 ENCOUNTER — Ambulatory Visit (HOSPITAL_COMMUNITY)
Admission: RE | Admit: 2020-04-01 | Discharge: 2020-04-01 | Disposition: A | Discharge status: Home / Self Care | Payer: Self-pay | Attending: Cardiovascular Disease | Admitting: Cardiovascular Disease

## 2020-04-01 DIAGNOSIS — Z79899 Other long term (current) drug therapy: Secondary | ICD-10-CM | POA: Insufficient documentation

## 2020-04-01 DIAGNOSIS — K219 Gastro-esophageal reflux disease without esophagitis: Secondary | ICD-10-CM | POA: Insufficient documentation

## 2020-04-01 DIAGNOSIS — Z886 Allergy status to analgesic agent status: Secondary | ICD-10-CM | POA: Insufficient documentation

## 2020-04-01 DIAGNOSIS — F17210 Nicotine dependence, cigarettes, uncomplicated: Secondary | ICD-10-CM | POA: Insufficient documentation

## 2020-04-01 DIAGNOSIS — I252 Old myocardial infarction: Secondary | ICD-10-CM

## 2020-04-01 DIAGNOSIS — E785 Hyperlipidemia, unspecified: Secondary | ICD-10-CM | POA: Insufficient documentation

## 2020-04-01 DIAGNOSIS — Z7902 Long term (current) use of antithrombotics/antiplatelets: Secondary | ICD-10-CM | POA: Insufficient documentation

## 2020-04-01 DIAGNOSIS — I25119 Atherosclerotic heart disease of native coronary artery with unspecified angina pectoris: Secondary | ICD-10-CM | POA: Insufficient documentation

## 2020-04-01 DIAGNOSIS — Z955 Presence of coronary angioplasty implant and graft: Secondary | ICD-10-CM | POA: Insufficient documentation

## 2020-04-01 DIAGNOSIS — E119 Type 2 diabetes mellitus without complications: Secondary | ICD-10-CM | POA: Insufficient documentation

## 2020-04-01 DIAGNOSIS — I10 Essential (primary) hypertension: Secondary | ICD-10-CM | POA: Insufficient documentation

## 2020-04-01 HISTORY — PX: LEFT HEART CATH AND CORONARY ANGIOGRAPHY: CATH118249

## 2020-04-01 LAB — GLUCOSE, CAPILLARY: Glucose-Capillary: 236 mg/dL — ABNORMAL HIGH (ref 70–99)

## 2020-04-01 SURGERY — LEFT HEART CATH AND CORONARY ANGIOGRAPHY
Anesthesia: LOCAL

## 2020-04-01 MED ORDER — LIDOCAINE HCL (PF) 1 % IJ SOLN
INTRAMUSCULAR | Status: AC
  Filled 2020-04-01: qty 30

## 2020-04-01 MED ORDER — IOHEXOL 350 MG/ML SOLN
INTRAVENOUS | Status: DC | PRN
  Administered 2020-04-01: 35 mL

## 2020-04-01 MED ORDER — HEPARIN SODIUM (PORCINE) 1000 UNIT/ML IJ SOLN
INTRAMUSCULAR | Status: AC
  Filled 2020-04-01: qty 1

## 2020-04-01 MED ORDER — HEPARIN SODIUM (PORCINE) 1000 UNIT/ML IJ SOLN
INTRAMUSCULAR | Status: DC | PRN
  Administered 2020-04-01: 5000 [IU] via INTRAVENOUS

## 2020-04-01 MED ORDER — SODIUM CHLORIDE 0.9 % IV SOLN: 250.0000 mL | INTRAVENOUS | Status: DC | PRN

## 2020-04-01 MED ORDER — MORPHINE SULFATE (PF) 2 MG/ML IV SOLN: 2.0000 mg | INTRAVENOUS | Status: DC | PRN

## 2020-04-01 MED ORDER — ONDANSETRON HCL 4 MG/2ML IJ SOLN: 4.0000 mg | Freq: Four times a day (QID) | INTRAMUSCULAR | Status: DC | PRN

## 2020-04-01 MED ORDER — VERAPAMIL HCL 2.5 MG/ML IV SOLN
INTRAVENOUS | Status: DC | PRN
  Administered 2020-04-01: 10 mL via INTRA_ARTERIAL

## 2020-04-01 MED ORDER — SODIUM CHLORIDE 0.9 % WEIGHT BASED INFUSION
3.0000 mL/kg/h | INTRAVENOUS | Status: AC
  Administered 2020-04-01: 3 mL/kg/h via INTRAVENOUS

## 2020-04-01 MED ORDER — VERAPAMIL HCL 2.5 MG/ML IV SOLN
INTRA_ARTERIAL | Status: DC | PRN
  Administered 2020-04-01: 7.5 mL via INTRA_ARTERIAL

## 2020-04-01 MED ORDER — ACETAMINOPHEN 325 MG PO TABS: 650.0000 mg | ORAL_TABLET | ORAL | Status: DC | PRN

## 2020-04-01 MED ORDER — FENTANYL CITRATE (PF) 100 MCG/2ML IJ SOLN
INTRAMUSCULAR | Status: DC | PRN
  Administered 2020-04-01: 25 ug via INTRAVENOUS

## 2020-04-01 MED ORDER — SODIUM CHLORIDE 0.9% FLUSH: 3.0000 mL | Freq: Two times a day (BID) | INTRAVENOUS | Status: DC

## 2020-04-01 MED ORDER — MIDAZOLAM HCL 2 MG/2ML IJ SOLN
INTRAMUSCULAR | Status: AC
  Filled 2020-04-01: qty 2

## 2020-04-01 MED ORDER — CLOPIDOGREL BISULFATE 75 MG PO TABS: 75.0000 mg | ORAL_TABLET | ORAL | Status: DC

## 2020-04-01 MED ORDER — VERAPAMIL HCL 2.5 MG/ML IV SOLN
INTRAVENOUS | Status: AC
  Filled 2020-04-01: qty 2

## 2020-04-01 MED ORDER — SODIUM CHLORIDE 0.9% FLUSH: 3.0000 mL | INTRAVENOUS | Status: DC | PRN

## 2020-04-01 MED ORDER — SODIUM CHLORIDE 0.9 % IV SOLN: INTRAVENOUS | Status: DC

## 2020-04-01 MED ORDER — NITROGLYCERIN 1 MG/10 ML FOR IR/CATH LAB
INTRA_ARTERIAL | Status: AC
  Filled 2020-04-01: qty 10

## 2020-04-01 MED ORDER — MIDAZOLAM HCL 2 MG/2ML IJ SOLN
INTRAMUSCULAR | Status: DC | PRN
  Administered 2020-04-01: 1 mg via INTRAVENOUS

## 2020-04-01 MED ORDER — CEFAZOLIN SODIUM-DEXTROSE 2-3 GM-%(50ML) IV SOLR
INTRAVENOUS | Status: AC | PRN
  Administered 2020-04-01: 2 g via INTRAVENOUS

## 2020-04-01 MED ORDER — LABETALOL HCL 5 MG/ML IV SOLN: 10.0000 mg | INTRAVENOUS | Status: DC | PRN

## 2020-04-01 MED ORDER — HYDRALAZINE HCL 20 MG/ML IJ SOLN: 10.0000 mg | INTRAMUSCULAR | Status: DC | PRN

## 2020-04-01 MED ORDER — LIDOCAINE HCL (PF) 1 % IJ SOLN
INTRAMUSCULAR | Status: DC | PRN
  Administered 2020-04-01: 2 mL

## 2020-04-01 MED ORDER — SODIUM CHLORIDE 0.9 % WEIGHT BASED INFUSION: 1.0000 mL/kg/h | INTRAVENOUS | Status: DC

## 2020-04-01 MED ORDER — HEPARIN (PORCINE) IN NACL 1000-0.9 UT/500ML-% IV SOLN
INTRAVENOUS | Status: AC
  Filled 2020-04-01: qty 1000

## 2020-04-01 MED ORDER — CLOPIDOGREL BISULFATE 75 MG PO TABS: 75.0000 mg | ORAL_TABLET | Freq: Every day | ORAL | Status: DC

## 2020-04-01 MED ORDER — HEPARIN (PORCINE) IN NACL 1000-0.9 UT/500ML-% IV SOLN
INTRAVENOUS | Status: DC | PRN
  Administered 2020-04-01 (×2): 500 mL

## 2020-04-01 MED ORDER — FENTANYL CITRATE (PF) 100 MCG/2ML IJ SOLN
INTRAMUSCULAR | Status: AC
  Filled 2020-04-01: qty 2

## 2020-04-01 MED ORDER — CEFAZOLIN SODIUM-DEXTROSE 2-4 GM/100ML-% IV SOLN
INTRAVENOUS | Status: AC
  Filled 2020-04-01: qty 100

## 2020-04-01 SURGICAL SUPPLY — 13 items
CATH INFINITI 5FR ANG PIGTAIL (CATHETERS) ×1 IMPLANT
CATH OPTITORQUE TIG 4.0 5F (CATHETERS) ×1 IMPLANT
DEVICE RAD COMP TR BAND LRG (VASCULAR PRODUCTS) ×1 IMPLANT
GLIDESHEATH SLEND A-KIT 6F 22G (SHEATH) ×1 IMPLANT
GUIDEWIRE INQWIRE 1.5J.035X260 (WIRE) IMPLANT
INQWIRE 1.5J .035X260CM (WIRE) ×2
KIT HEART LEFT (KITS) ×2 IMPLANT
PACK CARDIAC CATHETERIZATION (CUSTOM PROCEDURE TRAY) ×2 IMPLANT
SHEATH PROBE COVER 6X72 (BAG) ×2 IMPLANT
SYR MEDRAD MARK 7 150ML (SYRINGE) ×2 IMPLANT
TRANSDUCER W/STOPCOCK (MISCELLANEOUS) ×2 IMPLANT
TUBING CIL FLEX 10 FLL-RA (TUBING) ×2 IMPLANT
WIRE HI TORQ VERSACORE-J 145CM (WIRE) ×1 IMPLANT

## 2020-04-01 NOTE — Interval H&P Note (Signed)
Cath Lab Visit (complete for each Cath Lab visit)  Clinical Evaluation Leading to the Procedure:   ACS: No.  Non-ACS:    Anginal Classification: CCS II  Anti-ischemic medical therapy: No Therapy  Non-Invasive Test Results: No non-invasive testing performed  Prior CABG: No previous CABG      History and Physical Interval Note:  04/01/2020 1:06 PM  William Young  has presented today for surgery, with the diagnosis of CAD.  The various methods of treatment have been discussed with the patient and family. After consideration of risks, benefits and other options for treatment, the patient has consented to  Procedure(s): LEFT HEART CATH AND CORONARY ANGIOGRAPHY (N/A) as a surgical intervention.  The patient's history has been reviewed, patient examined, no change in status, stable for surgery.  I have reviewed the patient's chart and labs.  Questions were answered to the patient's satisfaction.     Nanetta Batty

## 2020-04-01 NOTE — Discharge Instructions (Signed)

## 2020-04-02 ENCOUNTER — Encounter (HOSPITAL_COMMUNITY): Payer: Self-pay | Admitting: Cardiovascular Disease

## 2020-04-02 MED FILL — Cefazolin Sodium-Dextrose IV Solution 2 GM/100ML-4%: INTRAVENOUS | Qty: 100 | Status: AC

## 2020-04-03 ENCOUNTER — Other Ambulatory Visit: Payer: Self-pay

## 2020-04-03 MED ORDER — ATORVASTATIN CALCIUM 80 MG PO TABS
80.0000 mg | ORAL_TABLET | Freq: Every day | ORAL | 3 refills | Status: DC
Start: 2020-04-03 — End: 2022-03-20

## 2020-04-03 MED FILL — ATORVASTATIN CALCIUM 80 MG: 80 | 30 days supply | Qty: 30 | Fill #0

## 2020-04-03 NOTE — Progress Notes (Signed)
The patient has been notified of the result and verbalized understanding.  All questions (if any) were answered.  New Rx for Lipitor 80 mg/daily sent to pharmacy.  Patient aware of medication dosage change and verbalized understanding.  Leanord Hawking, RN 04/03/2020 9:43 AM

## 2020-04-09 MED FILL — ?CLOPIDOGREL 75MG TA: 75 | 30 days supply | Qty: 30 | Fill #10

## 2020-04-30 MED FILL — ?CHLORTHALIDONE 25 MG TABLE: 25 | 30 days supply | Qty: 30 | Fill #1

## 2020-04-30 MED FILL — LOSARTAN POTASSIUM 100 MG T: 100 | 30 days supply | Qty: 30 | Fill #7

## 2020-05-07 MED FILL — ?CLOPIDOGREL 75MG TA: 75 | 30 days supply | Qty: 30 | Fill #11

## 2020-05-07 MED FILL — ATORVASTATIN CALCIUM 80 MG: 80 | 30 days supply | Qty: 30 | Fill #1

## 2020-05-14 ENCOUNTER — Other Ambulatory Visit: Payer: Self-pay

## 2020-05-14 ENCOUNTER — Encounter (HOSPITAL_COMMUNITY): Payer: Self-pay | Admitting: *Deleted

## 2020-05-14 ENCOUNTER — Emergency Department (HOSPITAL_COMMUNITY)
Admission: EM | Admit: 2020-05-14 | Discharge: 2020-05-15 | Disposition: A | Discharge status: Home / Self Care | Payer: Self-pay | Attending: Emergency Medicine | Admitting: Emergency Medicine

## 2020-05-14 DIAGNOSIS — Z5321 Procedure and treatment not carried out due to patient leaving prior to being seen by health care provider: Secondary | ICD-10-CM | POA: Insufficient documentation

## 2020-05-14 DIAGNOSIS — R262 Difficulty in walking, not elsewhere classified: Secondary | ICD-10-CM | POA: Insufficient documentation

## 2020-05-14 DIAGNOSIS — M545 Low back pain, unspecified: Secondary | ICD-10-CM | POA: Insufficient documentation

## 2020-05-14 LAB — CBC
HCT: 41 % (ref 39.0–52.0)
Hemoglobin: 14.2 g/dL (ref 13.0–17.0)
MCH: 32.6 pg (ref 26.0–34.0)
MCHC: 34.6 g/dL (ref 30.0–36.0)
MCV: 94 fL (ref 80.0–100.0)
Platelets: 208 10*3/uL (ref 150–400)
RBC: 4.36 MIL/uL (ref 4.22–5.81)
RDW: 12.9 % (ref 11.5–15.5)
WBC: 9.6 10*3/uL (ref 4.0–10.5)
nRBC: 0 % (ref 0.0–0.2)

## 2020-05-14 LAB — URINALYSIS, ROUTINE W REFLEX MICROSCOPIC
Bilirubin Urine: NEGATIVE
Glucose, UA: >=500 mg/dL — AB
Hgb urine dipstick: NEGATIVE
Ketones, ur: NEGATIVE mg/dL
Leukocytes,Ua: NEGATIVE
Nitrite: NEGATIVE
Protein, ur: NEGATIVE mg/dL
Specific Gravity, Urine: 1.02 (ref 1.005–1.030)
pH: 5 (ref 5.0–8.0)

## 2020-05-14 LAB — BASIC METABOLIC PANEL
Anion gap: 12 (ref 5–15)
BUN: 16 mg/dL (ref 6–20)
CO2: 21 mmol/L — ABNORMAL LOW (ref 22–32)
Calcium: 9.3 mg/dL (ref 8.9–10.3)
Chloride: 101 mmol/L (ref 98–111)
Creatinine, Ser: 1.09 mg/dL (ref 0.61–1.24)
GFR, Estimated: >60 mL/min (ref >60)
Glucose, Bld: 270 mg/dL — ABNORMAL HIGH (ref 70–99)
Potassium: 4.4 mmol/L (ref 3.5–5.1)
Sodium: 134 mmol/L — ABNORMAL LOW (ref 135–145)

## 2020-05-14 NOTE — ED Triage Notes (Signed)
Per ems, pt from home. Pt stood up from couch and started having right lower back pain with difficulty walking. Pt A&Ox4, EMS VSS.

## 2020-05-14 NOTE — ED Triage Notes (Signed)
Pt states he is having "kidney pain" pointing to the right lower back area. He denies n/v/d or urinary symptoms in triage. Says the pain is so back he is having difficulty walking. Taking tylenol for pain.

## 2020-05-15 NOTE — ED Notes (Signed)
Pt states he is going to go home, wife is here to pick him up.

## 2020-05-17 ENCOUNTER — Encounter (HOSPITAL_COMMUNITY): Payer: Self-pay | Admitting: Emergency Medicine

## 2020-05-17 ENCOUNTER — Emergency Department (HOSPITAL_COMMUNITY)
Admission: EM | Admit: 2020-05-17 | Discharge: 2020-05-18 | Disposition: A | Discharge status: Home / Self Care | Payer: Self-pay | Attending: Emergency Medicine | Admitting: Emergency Medicine

## 2020-05-17 ENCOUNTER — Other Ambulatory Visit: Payer: Self-pay

## 2020-05-17 DIAGNOSIS — Z79899 Other long term (current) drug therapy: Secondary | ICD-10-CM | POA: Insufficient documentation

## 2020-05-17 DIAGNOSIS — R911 Solitary pulmonary nodule: Secondary | ICD-10-CM | POA: Insufficient documentation

## 2020-05-17 DIAGNOSIS — M5441 Lumbago with sciatica, right side: Secondary | ICD-10-CM

## 2020-05-17 DIAGNOSIS — Z7901 Long term (current) use of anticoagulants: Secondary | ICD-10-CM | POA: Insufficient documentation

## 2020-05-17 DIAGNOSIS — I714 Abdominal aortic aneurysm, without rupture, unspecified: Secondary | ICD-10-CM

## 2020-05-17 DIAGNOSIS — F17210 Nicotine dependence, cigarettes, uncomplicated: Secondary | ICD-10-CM | POA: Insufficient documentation

## 2020-05-17 DIAGNOSIS — I10 Essential (primary) hypertension: Secondary | ICD-10-CM | POA: Insufficient documentation

## 2020-05-17 DIAGNOSIS — R918 Other nonspecific abnormal finding of lung field: Secondary | ICD-10-CM

## 2020-05-17 DIAGNOSIS — K219 Gastro-esophageal reflux disease without esophagitis: Secondary | ICD-10-CM | POA: Insufficient documentation

## 2020-05-17 DIAGNOSIS — I2510 Atherosclerotic heart disease of native coronary artery without angina pectoris: Secondary | ICD-10-CM | POA: Insufficient documentation

## 2020-05-17 DIAGNOSIS — E119 Type 2 diabetes mellitus without complications: Secondary | ICD-10-CM | POA: Insufficient documentation

## 2020-05-17 LAB — BASIC METABOLIC PANEL
Anion gap: 13 (ref 5–15)
BUN: 22 mg/dL — ABNORMAL HIGH (ref 6–20)
CO2: 19 mmol/L — ABNORMAL LOW (ref 22–32)
Calcium: 9.3 mg/dL (ref 8.9–10.3)
Chloride: 101 mmol/L (ref 98–111)
Creatinine, Ser: 1.12 mg/dL (ref 0.61–1.24)
GFR, Estimated: >60 mL/min (ref >60)
Glucose, Bld: 262 mg/dL — ABNORMAL HIGH (ref 70–99)
Potassium: 4.2 mmol/L (ref 3.5–5.1)
Sodium: 133 mmol/L — ABNORMAL LOW (ref 135–145)

## 2020-05-17 LAB — CBC
HCT: 41.2 % (ref 39.0–52.0)
Hemoglobin: 13.8 g/dL (ref 13.0–17.0)
MCH: 31.8 pg (ref 26.0–34.0)
MCHC: 33.5 g/dL (ref 30.0–36.0)
MCV: 94.9 fL (ref 80.0–100.0)
Platelets: 207 10*3/uL (ref 150–400)
RBC: 4.34 MIL/uL (ref 4.22–5.81)
RDW: 12.9 % (ref 11.5–15.5)
WBC: 9 10*3/uL (ref 4.0–10.5)
nRBC: 0 % (ref 0.0–0.2)

## 2020-05-17 LAB — URINALYSIS, ROUTINE W REFLEX MICROSCOPIC
Bilirubin Urine: NEGATIVE
Glucose, UA: >=500 mg/dL — AB
Hgb urine dipstick: NEGATIVE
Ketones, ur: NEGATIVE mg/dL
Leukocytes,Ua: NEGATIVE
Nitrite: NEGATIVE
Protein, ur: NEGATIVE mg/dL
Specific Gravity, Urine: 1.026 (ref 1.005–1.030)
pH: 5 (ref 5.0–8.0)

## 2020-05-17 NOTE — ED Triage Notes (Signed)
Pt reports checking in Tues for right flank pain. Left before being seen. Pt reports pain continues. Unable to take it anymore. Pt awake, alert, appropriate. VSS.

## 2020-05-18 ENCOUNTER — Emergency Department (HOSPITAL_COMMUNITY): Payer: Self-pay

## 2020-05-18 LAB — URINE CULTURE: Culture: <10000 — AB

## 2020-05-18 MED ORDER — METHOCARBAMOL 750 MG PO TABS: 750.0000 mg | ORAL_TABLET | Freq: Two times a day (BID) | ORAL | 0 refills | Status: DC | PRN

## 2020-05-18 MED ORDER — OXYCODONE-ACETAMINOPHEN 5-325 MG PO TABS: 1.0000 | ORAL_TABLET | Freq: Four times a day (QID) | ORAL | 0 refills | Status: DC | PRN

## 2020-05-18 MED ORDER — OXYCODONE-ACETAMINOPHEN 5-325 MG PO TABS
2.0000 | ORAL_TABLET | Freq: Once | ORAL | Status: AC
  Administered 2020-05-18: 2 via ORAL
  Filled 2020-05-18: qty 2

## 2020-05-18 MED ORDER — IOHEXOL 350 MG/ML SOLN
100.0000 mL | Freq: Once | INTRAVENOUS | Status: AC | PRN
  Administered 2020-05-18: 100 mL via INTRAVENOUS

## 2020-05-18 MED ORDER — LIDOCAINE 5 % EX PTCH: 1.0000 | MEDICATED_PATCH | CUTANEOUS | 0 refills | Status: DC

## 2020-05-18 MED ORDER — METHOCARBAMOL 500 MG PO TABS
1000.0000 mg | ORAL_TABLET | Freq: Once | ORAL | Status: AC
  Administered 2020-05-18: 1000 mg via ORAL
  Filled 2020-05-18: qty 2

## 2020-05-18 NOTE — ED Provider Notes (Signed)
[facility] EMERGENCY DEPARTMENT Provider Note   CSN: [id number] Arrival date & time: 05/17/20  1545     History Chief Complaint  Patient presents with  . Flank Pain    William Young is a 60 y.o. male.  HPI      William Young is a 60 y.o. male, with a history of CAD, GERD, hyperlipidemia, HTN, NSTEMI, DM, tobacco use, presenting to the ED with right lower back pain beginning January 4. Patient states he was changing positions, getting out of bed, when he felt a pain described as a tightness in the right lower back, radiating into the right buttocks, accompanied by numbness in the right leg. He presented that day to the emergency department, but left without being seen due to wait time. He has been taking Tylenol, applying heating pad, and using Biofreeze without resolution.  Denies urinary symptoms, weakness, abdominal pain, syncope, changes in bowel or bladder function, saddle anesthesias, groin pain, chest pain, shortness of breath, lower extremity swelling/color change, falls/trauma, or any other complaints.  Past Medical History:  Diagnosis Date  . CAD (coronary artery disease) 09/2014   a. NSTEMI >> LHC 5/16:  mLAD 15, pRCA 90, mRCA 40, RPLB3 80, EF normal with inf HK >> PCI:  BMS to pRCA and DES to RPLB3   . GERD (gastroesophageal reflux disease)   . History of echocardiogram    Echo 6/16:  EF 50-55%, no RWMA, Gr 1 DD  . HLD (hyperlipidemia)    Hattie Perch 03/15/2017  . HTN (hypertension)   . Hx of NSTEMI 5/16 tx with BMS to [facility] and DES to [facility] 09/10/2014  . Hyperglycemia   . OSA (obstructive sleep apnea)    "dx'd; couldn't tolerate mask" (03/15/2017)  . Tobacco use   . Type II diabetes mellitus (HCC)   . Varicose veins of both lower extremities    S/P ablation 02/2017    Patient Active Problem List   Diagnosis Date Noted  . Thrombocytopenia (HCC) 08/13/2017  . Dizziness   . Precordial pain   . History of coronary artery disease   . Varicose  veins of right lower extremity with complications 10/27/2016  . HLD (hyperlipidemia) 09/28/2014  . Obstructive sleep apnea 09/14/2014  . Morbid obesity (HCC) 09/12/2014  . Coronary artery disease   . Hx of NSTEMI 5/16 tx with BMS to [facility] and DES to [facility] 09/10/2014  . Essential hypertension 09/10/2014  . Tobacco use   . GERD (gastroesophageal reflux disease)     Past Surgical History:  Procedure Laterality Date  . CARDIAC CATHETERIZATION N/A 09/11/2014   Procedure: Left Heart Cath and Coronary Angiography;  Surgeon: Marykay Lex, MD;  Location: [facility] INVASIVE CV LAB CUPID;  Service: Cardiovascular;  Laterality: N/A;  . CARDIAC CATHETERIZATION  09/11/2014   Procedure: Coronary Stent Intervention;  Surgeon: Marykay Lex, MD;  Location: [facility] INVASIVE CV LAB CUPID;  Service: Cardiovascular;;  . ENDOVENOUS ABLATION SAPHENOUS VEIN W/ LASER Right 02/22/2017   endovenous laser ablation R GSV and stab phlebectomy >20 incisions R leg by Josephina Gip MD   . LEFT HEART CATH AND CORONARY ANGIOGRAPHY N/A 04/01/2020   Procedure: LEFT HEART CATH AND CORONARY ANGIOGRAPHY;  Surgeon: Runell Gess, MD;  Location: [facility] INVASIVE CV LAB;  Service: Cardiovascular;  Laterality: N/A;       Family History  Problem Relation Age of Onset  . Heart attack Maternal Uncle   . Heart attack Paternal Grandmother   . Cancer Maternal Grandfather   .  Heart attack Maternal Grandmother   . Stroke Neg Hx   . Hypertension Neg Hx     Social History   Tobacco Use  . Smoking status: Current Every Day Smoker    Packs/day: 1.00    Years: 40.00    Pack years: 40.00    Types: Cigarettes  . Smokeless tobacco: Former Neurosurgeon    Types: Engineer, drilling  . Vaping Use: Never used  Substance Use Topics  . Alcohol use: Yes    Alcohol/week: 6.0 standard drinks    Types: 6 Cans of beer per week  . Drug use: No    Home Medications Prior to Admission medications   Medication Sig Start Date End Date Taking? Authorizing  Provider  lidocaine (LIDODERM) 5 % Place 1 patch onto the skin daily. Remove & Discard patch within 12 hours or as directed by MD 05/18/20  Yes Nanda Bittick C, PA-C  methocarbamol (ROBAXIN) 750 MG tablet Take 1 tablet (750 mg total) by mouth 2 (two) times daily as needed for muscle spasms (or muscle tightness). 05/18/20  Yes Naida Escalante C, PA-C  oxyCODONE-acetaminophen (PERCOCET/ROXICET) 5-325 MG tablet Take 1-2 tablets by mouth every 6 (six) hours as needed for severe pain. 05/18/20  Yes Tenasia Aull C, PA-C  acetaminophen (TYLENOL) 325 MG tablet Take 650 mg every 6 (six) hours as needed by mouth for mild pain.    [provider]  atorvastatin (LIPITOR) 80 MG tablet Take 1 tablet (80 mg total) by mouth daily. 04/03/20   [name], MD  chlorthalidone (HYGROTON) 25 MG tablet TAKE 1 TABLET BY MOUTH DAILY. TO REPLACE HCTZ 03/29/20   [name], MD  clopidogrel (PLAVIX) 75 MG tablet TAKE 1 TABLET (75 MG TOTAL) BY MOUTH DAILY. 06/14/19   [name] T, PA-C  losartan (COZAAR) 100 MG tablet TAKE 1 TABLET (100 MG TOTAL) BY MOUTH DAILY. Patient taking differently: Take 100 mg by mouth daily.  10/02/19   [name], MD  nitroGLYCERIN (NITROSTAT) 0.4 MG SL tablet Place 1 tablet (0.4 mg total) every 5 (five) minutes x 3 doses as needed under the tongue for chest pain. 03/16/17   Manson Passey, PA    Allergies    Aspirin and Ibuprofen  Review of Systems   Review of Systems  Constitutional: Negative for chills and fever.  Respiratory: Negative for shortness of breath.   Cardiovascular: Negative for chest pain and leg swelling.  Gastrointestinal: Negative for abdominal pain, constipation, diarrhea, nausea and vomiting.  Genitourinary: Negative for difficulty urinating, dysuria, flank pain, hematuria and testicular pain.  Musculoskeletal: Positive for back pain.  Neurological: Positive for numbness. Negative for syncope and weakness.  All other systems reviewed and are negative.   Physical  Exam Updated Vital Signs BP (!) 144/65 (BP Location: Left Arm)   Pulse 85   Temp 97.7 F (36.5 C) (Oral)   Resp 18   Ht 6\' 3"  (1.905 m)   Wt 131.5 kg   SpO2 98%   BMI 36.25 kg/m   Physical Exam Vitals and nursing note reviewed.  Constitutional:      General: He is not in acute distress.    Appearance: He is well-developed. He is not diaphoretic.  HENT:     Head: Normocephalic and atraumatic.     Mouth/Throat:     Mouth: Mucous membranes are moist.     Pharynx: Oropharynx is clear.  Eyes:     Conjunctiva/sclera: Conjunctivae normal.  Cardiovascular:  Rate and Rhythm: Normal rate and regular rhythm.     Pulses:          Radial pulses are 2+ on the right side and 2+ on the left side.       Dorsalis pedis pulses are 1+ on the right side and 1+ on the left side.       Posterior tibial pulses are 1+ on the right side and 1+ on the left side.     Heart sounds: Normal heart sounds.     Comments: Tactile temperature in the extremities appropriate and equal bilaterally. Pulmonary:     Effort: Pulmonary effort is normal. No respiratory distress.     Breath sounds: Normal breath sounds.  Abdominal:     Palpations: Abdomen is soft.     Tenderness: There is no abdominal tenderness. There is no guarding.  Musculoskeletal:     Cervical back: Neck supple.     Lumbar back: Tenderness present.       Back:     Right lower leg: No edema.     Left lower leg: No edema.     Comments: Normal motor function intact in all extremities. No midline spinal tenderness.   Lymphadenopathy:     Cervical: No cervical adenopathy.  Skin:    General: Skin is warm and dry.  Neurological:     Mental Status: He is alert.     Comments: Sensation to light touch grossly intact in the lower extremities with the exception of some decreased sensation in the right toes. No saddle anesthesias. Strength 5/5 in the bilateral lower extremities. Slow, but steady gait. Coordination intact.  Psychiatric:         Mood and Affect: Mood and affect normal.        Speech: Speech normal.        Behavior: Behavior normal.     ED Results / Procedures / Treatments   Labs (all labs ordered are listed, but only abnormal results are displayed) Labs Reviewed  BASIC METABOLIC PANEL - Abnormal; Notable for the following components:      Result Value   Sodium 133 (*)    CO2 19 (*)    Glucose, Bld 262 (*)    BUN 22 (*)    All other components within normal limits  URINALYSIS, ROUTINE W REFLEX MICROSCOPIC - Abnormal; Notable for the following components:   Glucose, UA >=500 (*)    Bacteria, UA RARE (*)    All other components within normal limits  URINE CULTURE  CBC    EKG None  Radiology CT Angio Chest/Abd/Pel for Dissection W and/or Wo Contrast  Result Date: 05/18/2020 CLINICAL DATA:  Sudden onset low back pain with right leg neuro deficit EXAM: CT ANGIOGRAPHY CHEST, ABDOMEN AND PELVIS TECHNIQUE: Non-contrast CT of the chest was initially obtained. Multidetector CT imaging through the chest, abdomen and pelvis was performed using the standard protocol during bolus administration of intravenous contrast. Multiplanar reconstructed images and MIPs were obtained and reviewed to evaluate the vascular anatomy. CONTRAST:  100mL OMNIPAQUE IOHEXOL 350 MG/ML SOLN COMPARISON:  2017 FINDINGS: CTA CHEST FINDINGS Cardiovascular: Thoracic aorta is normal in caliber without evidence of intramural hematoma or dissection. Atherosclerosis is present. Normal heart size. No pericardial effusion. Coronary artery calcification. Mediastinum/Nodes: No enlarged lymph nodes. Subcentimeter right thyroid nodule for which no further ultrasound follow-up is recommended by current guidelines. Esophagus is unremarkable. Lungs/Pleura: No consolidation. There is a 6 x 5 mm posterior right upper lobe nodule (series 7,  image 36). There is a 3 mm right middle lobe subpleural nodule (image 83). Musculoskeletal: No acute osseous abnormality. Review  of the MIP images confirms the above findings. CTA ABDOMEN AND PELVIS FINDINGS VASCULAR Aorta: No evidence of dissection. Diffuse mixed plaque including irregular fibrofatty plaque. Infrarenal aneurysmal dilatation measuring to 3.8 cm. Celiac: Patent. SMA: Patent. Renals: Patent. Plaque at the left renal artery origin causes moderate stenosis. IMA: Patent. Inflow: Diffuse mixed plaque without high-grade stenosis or aneurysmal dilatation. Veins: Not evaluated. Review of the MIP images confirms the above findings. NON-VASCULAR Hepatobiliary: No focal liver abnormality is seen. No gallstones, gallbladder wall thickening, or biliary dilatation. Pancreas: Unremarkable. Spleen: Unremarkable. Adrenals/Urinary Tract: Adrenals are unremarkable. Right renal cyst. Bladder is partially distended but unremarkable. Stomach/Bowel: Stomach is within normal limits. Bowel is normal in caliber. Distal colonic diverticulosis. Normal appendix. Lymphatic: No enlarged lymph nodes. Reproductive: Unremarkable. Other: No ascites. No significant abnormality of the abdominal wall. Musculoskeletal: Degenerative changes of the lumbar spine. No acute osseous abnormality. Review of the MIP images confirms the above findings. IMPRESSION: No evidence of aortic dissection. Increased aortic atherosclerosis and increased size of infrarenal abdominal aortic aneurysm now measuring 3.8 cm. Recommend follow-up ultrasound every 2 years. This recommendation follows ACR consensus guidelines: White Paper of the ACR Incidental Findings Committee II on Vascular Findings. J Am Coll Radiol 2013; 16:109-60410:789-794. 6 mm right upper lobe and 3 mm right middle lobe nodules. Non-contrast chest CT at 6-12 months is recommended. If the nodule is stable at time of repeat CT, then future CT at 18-24 months (from today's scan) is considered optional for low-risk patients, but is recommended for high-risk patients. This recommendation follows the consensus statement: Guidelines  for Management of Incidental Pulmonary Nodules Detected on CT Images: From the Fleischner Society 2017; Radiology 2017; 284:228-243. Electronically Signed   By: Guadlupe SpanishPraneil  Patel M.D.   On: 05/18/2020 10:33    Procedures Procedures (including critical care time)  Medications Ordered in ED Medications  oxyCODONE-acetaminophen (PERCOCET/ROXICET) 5-325 MG per tablet 2 tablet (2 tablets Oral Given 05/18/20 0800)  methocarbamol (ROBAXIN) tablet 1,000 mg (1,000 mg Oral Given 05/18/20 0800)  iohexol (OMNIPAQUE) 350 MG/ML injection 100 mL (100 mLs Intravenous Contrast Given 05/18/20 1012)    ED Course  I have reviewed the triage vital signs and the nursing notes.  Pertinent labs & imaging results that were available during my care of the patient were reviewed by me and considered in my medical decision making (see chart for details).    MDM Rules/Calculators/A&P                          Patient presents with right lower back pain as well as endorsement of numbness in the right lower extremity. Patient is nontoxic appearing, afebrile, not tachycardic, not tachypneic, not hypotensive, maintains excellent SPO2 on room air.  He does have pain with movement and his pain is reproducible on palpation, however, due to the sudden onset of his pain, his risk factors, lack of history of back pain, unclear mechanism, and endorsement of neurologic deficit, we thought it prudent to assure no dissection or thrombus.  I have reviewed the patient's chart to obtain more information.   I reviewed and interpreted the patient's labs and radiological studies. No acute abnormality noted on CT imaging. Abdominal aortic aneurysm and lung nodules discussed with the patient as well as follow-up recommendations.  Avoided steroids due to patient's apparent poorly controlled diabetes.  Vitals:  05/18/20 0800 05/18/20 0919  BP: (!) 145/62 116/76  Pulse: 85 75  Resp: 18 12  Temp: 98.3 F (36.8 C)   SpO2: 99% 92%     Final Clinical Impression(s) / ED Diagnoses Final diagnoses:  Acute right-sided low back pain with right-sided sciatica  Abdominal aortic aneurysm (AAA) without rupture (HCC)  Lung nodules    Rx / DC Orders ED Discharge Orders         Ordered    oxyCODONE-acetaminophen (PERCOCET/ROXICET) 5-325 MG tablet  Every 6 hours PRN        05/18/20 1059    methocarbamol (ROBAXIN) 750 MG tablet  2 times daily PRN        05/18/20 1059    lidocaine (LIDODERM) 5 %  Every 24 hours        05/18/20 1059           Anselm Pancoast, PA-C 05/18/20 1124    Derwood Kaplan, MD 05/18/20 1402

## 2020-05-18 NOTE — Discharge Instructions (Addendum)
°  Take it easy, but do not lay around too much as this may make any stiffness worse.  Acetaminophen: May take acetaminophen (generic for Tylenol), as needed, for pain. Your daily total maximum amount of acetaminophen from all sources should be limited to 4000mg /day for persons without liver problems, or 2000mg /day for those with liver problems. Percocet: May take Percocet (oxycodone-acetaminophen) as needed for severe pain.   Do not drive or perform other dangerous activities while taking this medication as it can cause drowsiness as well as changes in reaction time and judgement.  Please note that each pill of Percocet contains 325 mg of acetaminophen (generic for Tylenol) and the above dosage limits apply. Methocarbamol: Methocarbamol (generic for Robaxin) is a muscle relaxer and can help relieve stiff muscles or muscle spasms.  Do not drive or perform other dangerous activities while taking this medication as it can cause drowsiness as well as changes in reaction time and judgement. Lidocaine patches: These are available via either prescription or over-the-counter. The over-the-counter option may be more economical one and are likely just as effective. There are multiple over-the-counter brands, such as Salonpas. Ice: May apply ice to the area over the next 24 hours for 15 minutes at a time to reduce pain, inflammation, and swelling, if present. Exercises: Be sure to perform the attached exercises starting with three times a week and working up to performing them daily. This is an essential part of preventing long term problems.  Follow up: Follow up with a primary care provider for any future management of these complaints. Be sure to follow up within 7-10 days. Return: Return to the ED should symptoms worsen.  For prescription assistance, may try using prescription discount sites or apps, such as goodrx.com  Lung nodules: There were lung nodules noted on the CT scan.  It is recommended that you  have a repeat noncontrast CT of the chest performed in 6 to 12 months.  This can be managed through a primary care provider or pulmonary specialist.  Abdominal aortic aneurysm: Abdominal aortic aneurysm was noted, slightly bigger than previous.  Recommendation is for follow-up with the vascular specialist and ultrasound about every 2 years for size monitoring.

## 2020-05-18 NOTE — ED Notes (Signed)
Pt was given his discharge paperwork with no c/o

## 2020-05-21 ENCOUNTER — Encounter: Payer: Self-pay | Admitting: Nurse Practitioner

## 2020-05-21 ENCOUNTER — Other Ambulatory Visit: Payer: Self-pay

## 2020-05-21 ENCOUNTER — Ambulatory Visit: Payer: Self-pay | Attending: Nurse Practitioner | Admitting: Nurse Practitioner

## 2020-05-21 DIAGNOSIS — M5441 Lumbago with sciatica, right side: Secondary | ICD-10-CM

## 2020-05-21 DIAGNOSIS — I714 Abdominal aortic aneurysm, without rupture, unspecified: Secondary | ICD-10-CM

## 2020-05-21 DIAGNOSIS — Z7689 Persons encountering health services in other specified circumstances: Secondary | ICD-10-CM

## 2020-05-21 NOTE — Progress Notes (Signed)
/ Virtual Visit via Telephone Note Due to national recommendations of social distancing due to COVID 19, telehealth visit is felt to be most appropriate for this patient at this time.  I discussed the limitations, risks, security and privacy concerns of performing an evaluation and management service by telephone and the availability of in person appointments. I also discussed with the patient that there may be a patient responsible charge related to this service. The patient expressed understanding and agreed to proceed.    I connected with Darrin Nipper on 05/21/20  at   3:10 PM EST  EDT by telephone and verified that I am speaking with the correct person using two identifiers.   Consent I discussed the limitations, risks, security and privacy concerns of performing an evaluation and management service by telephone and the availability of in person appointments. I also discussed with the patient that there may be a patient responsible charge related to this service. The patient expressed understanding and agreed to proceed.   Location of Patient: Private Residence   Location of Provider: [facility]    Persons participating in Telemedicine visit: Bertram Denver FNP-BC YY Custer CMA Darrin Nipper    History of Present Illness: Telemedicine visit for: Re establish care  I have not seen Mr. Duman since 2019 He has a PMH of CAD, DM, GERD, HTN, HPL, NSTEMI (09-2014) s/p PCI and stenting. Continues on plavix, CHF LVEF 38%, AAA 3.2 cm, Tobacco dependence, AAA 3.8 cm. He has an appt with Cardiology next week. Will likely need CTS consult for aneurysm. Patient has been advised to apply for financial assistance and schedule to see our financial counselor.  Recent CT 05-18-2020 6 mm right upper lobe and 3 mm right middle lobe nodules. Non-contrast chest CT at 6-12 months is recommended.   Continues on losartan 100 mg daily and chlorthalidone 25 mg daily.   Denies chest pain, shortness of breath, palpitations, lightheadedness, dizziness, headaches or BLE edema.  BP Readings from Last 3 Encounters:  05/18/20 117/70  05/14/20 123/88  04/01/20 128/69    Back Pain Right sided low back pain with right sided sciatica. Onset several weeks ago. Unrelated to trauma or injury. Pain radiates down from the right hip into his right leg. Has never had back pain before. There is also associated numbness. "Feels like someone shot me in the back" Pain described as sharp 11/10. Treatments tried: tylenol, heating pad, biofreeze. He was evaluated in the ED for his back pain last week. Diagnosed with sciatica and treated with oxcodone, methocarbamol and lidocaine patch script. Today he states the medications he was given in the ED are providing some relief of his back pain. He does work at [employer]. Wears compression stockings at work as he also has a history of varicose veins. Back pain is worse when working over a few hours.     Past Medical History:  Diagnosis Date  . CAD (coronary artery disease) 09/2014   a. NSTEMI >> LHC 5/16:  mLAD 15, pRCA 90, mRCA 40, RPLB3 80, EF normal with inf HK >> PCI:  BMS to pRCA and DES to RPLB3   . GERD (gastroesophageal reflux disease)   . History of echocardiogram    Echo 6/16:  EF 50-55%, no RWMA, Gr 1 DD  . HLD (hyperlipidemia)    Hattie Perch 03/15/2017  . HTN (hypertension)   . Hx of NSTEMI 5/16 tx with BMS to [facility] and DES to [facility] 09/10/2014  . Hyperglycemia   .  OSA (obstructive sleep apnea)    "dx'd; couldn't tolerate mask" (03/15/2017)  . Tobacco use   . Type II diabetes mellitus (HCC)   . Varicose veins of both lower extremities    S/P ablation 02/2017    Past Surgical History:  Procedure Laterality Date  . CARDIAC CATHETERIZATION N/A 09/11/2014   Procedure: Left Heart Cath and Coronary Angiography;  Surgeon: Marykay Lex, MD;  Location: [facility] INVASIVE CV LAB CUPID;  Service: Cardiovascular;  Laterality: N/A;  .  CARDIAC CATHETERIZATION  09/11/2014   Procedure: Coronary Stent Intervention;  Surgeon: Marykay Lex, MD;  Location: [facility] INVASIVE CV LAB CUPID;  Service: Cardiovascular;;  . ENDOVENOUS ABLATION SAPHENOUS VEIN W/ LASER Right 02/22/2017   endovenous laser ablation R GSV and stab phlebectomy >20 incisions R leg by Josephina Gip MD   . LEFT HEART CATH AND CORONARY ANGIOGRAPHY N/A 04/01/2020   Procedure: LEFT HEART CATH AND CORONARY ANGIOGRAPHY;  Surgeon: Runell Gess, MD;  Location: [facility] INVASIVE CV LAB;  Service: Cardiovascular;  Laterality: N/A;    Family History  Problem Relation Age of Onset  . Heart attack Maternal Uncle   . Heart attack Paternal Grandmother   . Cancer Maternal Grandfather   . Heart attack Maternal Grandmother   . Stroke Neg Hx   . Hypertension Neg Hx     Social History   Socioeconomic History  . Marital status: Widowed    Spouse name: Not on file  . Number of children: Not on file  . Years of education: Not on file  . Highest education level: Not on file  Occupational History  . Occupation: Event organiser systems  Tobacco Use  . Smoking status: Current Every Day Smoker    Packs/day: 1.00    Years: 40.00    Pack years: 40.00    Types: Cigarettes  . Smokeless tobacco: Former Neurosurgeon    Types: Engineer, drilling  . Vaping Use: Never used  Substance and Sexual Activity  . Alcohol use: Yes    Alcohol/week: 6.0 standard drinks    Types: 6 Cans of beer per week  . Drug use: No  . Sexual activity: Not Currently  Other Topics Concern  . Not on file  Social History Narrative   Lives with fiance and 2 daughters. Neither his parents nor siblings have any cardiac issues that he knows of.   Social Determinants of Health   Financial Resource Strain: Not on file  Food Insecurity: Not on file  Transportation Needs: Not on file  Physical Activity: Not on file  Stress: Not on file  Social Connections: Not on file     Observations/Objective: Awake,  alert and oriented x 3   Review of Systems  Constitutional: Negative for fever, malaise/fatigue and weight loss.  HENT: Negative.  Negative for nosebleeds.   Eyes: Negative.  Negative for blurred vision, double vision and photophobia.  Respiratory: Negative.  Negative for cough and shortness of breath.   Cardiovascular: Negative.  Negative for chest pain, palpitations and leg swelling.  Gastrointestinal: Negative.  Negative for heartburn, nausea and vomiting.  Musculoskeletal: Positive for back pain. Negative for myalgias.  Neurological: Negative.  Negative for dizziness, focal weakness, seizures and headaches.  Psychiatric/Behavioral: Negative.  Negative for suicidal ideas.    Assessment and Plan: Zadin was seen today for follow-up.  Diagnoses and all orders for this visit:  Encounter to establish care  Acute right-sided low back pain with right-sided sciatica Continue lidoderm patch, muscle relaxant and  pain reliever  Abdominal aortic aneurysm (AAA) without rupture [facility]) -     Ambulatory referral to Cardiothoracic Surgery Advised to stop smoking. Declines smoking cessation management today.  Smoking up to 2ppd.  Continue high intensity statin   Follow Up Instructions Return in about 6 weeks (around 07/02/2020).     I discussed the assessment and treatment plan with the patient. The patient was provided an opportunity to ask questions and all were answered. The patient agreed with the plan and demonstrated an understanding of the instructions.   The patient was advised to call back or seek an in-person evaluation if the symptoms worsen or if the condition fails to improve as anticipated.  I provided 20 minutes of non-face-to-face time during this encounter including median intraservice time, reviewing previous notes, labs, imaging, medications and explaining diagnosis and management.  Claiborne Rigg, FNP-BC

## 2020-05-23 ENCOUNTER — Telehealth: Payer: Self-pay | Admitting: Internal Medicine

## 2020-05-23 NOTE — Telephone Encounter (Signed)
Will send this message to [name] PA-C and covering assist, as a general FYI from the pt.

## 2020-05-23 NOTE — Telephone Encounter (Signed)
FYI--Patient would to make William Young aware that he was recently admitted to the ED. He requested that William Young review the notes prior to virtual appointment on 05/27/20.

## 2020-05-24 ENCOUNTER — Other Ambulatory Visit: Payer: Self-pay | Admitting: Nurse Practitioner

## 2020-05-24 ENCOUNTER — Ambulatory Visit: Payer: Self-pay | Attending: Nurse Practitioner

## 2020-05-24 ENCOUNTER — Other Ambulatory Visit: Payer: Self-pay

## 2020-05-24 DIAGNOSIS — Z1211 Encounter for screening for malignant neoplasm of colon: Secondary | ICD-10-CM

## 2020-05-24 DIAGNOSIS — I10 Essential (primary) hypertension: Secondary | ICD-10-CM

## 2020-05-24 DIAGNOSIS — Z125 Encounter for screening for malignant neoplasm of prostate: Secondary | ICD-10-CM

## 2020-05-24 DIAGNOSIS — I2510 Atherosclerotic heart disease of native coronary artery without angina pectoris: Secondary | ICD-10-CM

## 2020-05-24 DIAGNOSIS — R7309 Other abnormal glucose: Secondary | ICD-10-CM

## 2020-05-24 DIAGNOSIS — E785 Hyperlipidemia, unspecified: Secondary | ICD-10-CM

## 2020-05-25 LAB — CMP14+EGFR
ALT: 18 IU/L (ref 0–44)
AST: 11 IU/L (ref 0–40)
Albumin/Globulin Ratio: 1.7 (ref 1.2–2.2)
Albumin: 4.2 g/dL (ref 3.8–4.9)
Alkaline Phosphatase: 149 IU/L — ABNORMAL HIGH (ref 44–121)
BUN/Creatinine Ratio: 17 (ref 9–20)
BUN: 19 mg/dL (ref 6–24)
Bilirubin Total: 0.4 mg/dL (ref 0.0–1.2)
CO2: 23 mmol/L (ref 20–29)
Calcium: 9.1 mg/dL (ref 8.7–10.2)
Chloride: 99 mmol/L (ref 96–106)
Creatinine, Ser: 1.1 mg/dL (ref 0.76–1.27)
GFR calc Af Amer: 84 mL/min/{1.73_m2} (ref >59)
GFR calc non Af Amer: 73 mL/min/{1.73_m2} (ref >59)
Globulin, Total: 2.5 g/dL (ref 1.5–4.5)
Glucose: 304 mg/dL — ABNORMAL HIGH (ref 65–99)
Potassium: 4.2 mmol/L (ref 3.5–5.2)
Sodium: 138 mmol/L (ref 134–144)
Total Protein: 6.7 g/dL (ref 6.0–8.5)

## 2020-05-25 LAB — LIPID PANEL
Chol/HDL Ratio: 4 ratio (ref 0.0–5.0)
Cholesterol, Total: 131 mg/dL (ref 100–199)
HDL: 33 mg/dL — ABNORMAL LOW (ref >39)
LDL Chol Calc (NIH): 77 mg/dL (ref 0–99)
Triglycerides: 115 mg/dL (ref 0–149)
VLDL Cholesterol Cal: 21 mg/dL (ref 5–40)

## 2020-05-25 LAB — HEMOGLOBIN A1C
Est. average glucose Bld gHb Est-mCnc: 263 mg/dL
Hgb A1c MFr Bld: 10.8 % — ABNORMAL HIGH (ref 4.8–5.6)

## 2020-05-25 LAB — PSA: Prostate Specific Ag, Serum: 0.2 ng/mL (ref 0.0–4.0)

## 2020-05-26 ENCOUNTER — Encounter: Payer: Self-pay | Admitting: Physician Assistant

## 2020-05-26 ENCOUNTER — Other Ambulatory Visit: Payer: Self-pay | Admitting: Nurse Practitioner

## 2020-05-26 DIAGNOSIS — I714 Abdominal aortic aneurysm, without rupture, unspecified: Secondary | ICD-10-CM

## 2020-05-26 DIAGNOSIS — E1165 Type 2 diabetes mellitus with hyperglycemia: Secondary | ICD-10-CM

## 2020-05-26 DIAGNOSIS — I700 Atherosclerosis of aorta: Secondary | ICD-10-CM | POA: Insufficient documentation

## 2020-05-26 MED ORDER — METFORMIN HCL 500 MG PO TABS
500.0000 mg | ORAL_TABLET | Freq: Two times a day (BID) | ORAL | 3 refills | Status: DC
Start: 2020-05-26 — End: 2020-07-05

## 2020-05-26 MED ORDER — TRUEPLUS LANCETS 28G MISC: 3 refills | Status: DC

## 2020-05-26 MED ORDER — TRUE METRIX METER W/DEVICE KIT: PACK | 0 refills | Status: AC

## 2020-05-26 MED ORDER — EMPAGLIFLOZIN 25 MG PO TABS: 25.0000 mg | ORAL_TABLET | Freq: Every day | ORAL | 1 refills | Status: DC

## 2020-05-26 MED ORDER — TRUE METRIX BLOOD GLUCOSE TEST VI STRP
ORAL_STRIP | 12 refills | Status: DC
Start: 2020-05-26 — End: 2020-05-26

## 2020-05-26 NOTE — Progress Notes (Signed)
Virtual Visit via Video Note   This visit type was conducted due to national recommendations for restrictions regarding the COVID-19 Pandemic (e.g. social distancing) in an effort to limit this patient's exposure and mitigate transmission in our community.  Due to his co-morbid illnesses, this patient is at least at moderate risk for complications without adequate follow up.  This format is felt to be most appropriate for this patient at this time.  All issues noted in this document were discussed and addressed.  A limited physical exam was performed with this format.  Please refer to the patient's chart for his consent to telehealth for [facility].       Date:  05/27/2020   ID:  William Young, DOB [birth year], MRN [id number] The patient was identified using 2 identifiers.  Patient Location: Home Provider Location: Home Office  PCP:  Gildardo Pounds, NP  Cardiologist:  Dorris Carnes, MD   Electrophysiologist:  None   Evaluation Performed:  Follow-Up Visit  Chief Complaint:  Follow-up (CAD)    Patient Profile: William Young is a 60 y.o. male with:  Coronary artery disease  S/p NSTEMI in 5/16 >> PCI: BMS to pRCA; DES to RPLB3  Myoview 11/18: no ischemia   Echocardiogram 11/18: normal EF, inf-lat HK  ASA allergy  Cath 11/21: Patent RCA stent, patent RPL stent; medical therapy  Aortic atherosclerosis   Abdominal aortic aneurysm     CT 1/22: infrarenal 3.8 cm - repeat US in 2 years   Hx of mild Heart failure with preserved ejection fraction   Diabetes mellitus   Hypertension   Hyperlipidemia   Tobacco abuse   OSA  Lung nodules  CT 1/22: RUL 6 mm; RML 3 mm >> repeat CT 6-12 mos   Venous insufficiency  S/p R GSV laser ablation (Dr. Kellie Simmering) in 2018  Prior CV Studies: CT Chest/Abd/Pelvis 05/18/20 No Ao dissection Aortic atherosclerosis  Infrarenal abdominal aortic aneurysm 3.8 cm - needs [place] q 2 years  RUL nodule 6 mm; RML nodule 3 mm  Repeat Chest CT  w/o contrast in 6-12 mos  Cardiac catheterization 04/01/2020 RCA proximal stent patent, mid 40; RPDA 30; RPL stent patent LAD normal LCx normal  Echo 04/09/17 Mild LVH, EF 55-60, inf-lat HK, Gr 1 DD, mildly dilated asc aorta  Nuclear stress test 04/06/17 Medium size, moderate severity fixed inferolateral perfusion defect, likely attenuation artifact. No reversible ischemia. Moderately dilated LV with severe global hypokinesis. LVEF 38%. This is an intermediate risk study.  Echo 10/17/14 EF 50-55, no RWMA, Gr 1 DD, trivial TR  LHC/PCI 09/11/14 LM: ok LAD: Mid 15% LCx: Ok RCA: prox 90%, mid 40%, RPLB3 80% LVEF normal PCI: MultiLink Vision BMS 5.0 mm x 18 mm (5.2 mm) to pRCA; Resolute DES 2.5 mm x 14 mm (2.8 mm) to RPLB3   History of Present Illness:   Mr. Tiegs was last seen in clinic by Dr. Harrington Challenger in 11/21.  He had symptoms of progressive angina.  Cardiac catheterization was arranged.  This demonstrated a patent stent in the RCA and patent stent in the RPL branch.  He had no disease in the left system.  Medical therapy was continued.  He was seen in the emergency room 05/17/2020 with right lower back pain radiating down to his leg.  Chest, abdomen, and pelvic CT was obtained and demonstrated no dissection.  He did have a 3.8 cm AAA as well as right upper lobe and right middle lobe pulmonary nodules.  He will need  a follow-up ultrasound in 2 years to monitor his AAA.  He will need a follow-up noncontrast chest CT in 6-12 months to follow-up on his lung nodules.  He is seen for follow-up.  He has been having a lot of back pain with R leg symptoms.  He is being managed for sciatica.  He has not had chest pain, shortness of breath, syncope, orthopnea.  His R leg is s/w swollen.     Past Medical History:  Diagnosis Date  . Abdominal aortic aneurysm (AAA) ([place])    CT 1/22: infrarenal 3.8 cm - repeat US in 2 years  . Aortic atherosclerosis ([place])    CT in 03/2020  . CAD (coronary  artery disease) 09/2014   a. NSTEMI >> LHC 5/16:  mLAD 15, pRCA 90, mRCA 40, RPLB3 80, EF normal with inf HK >> PCI:  BMS to pRCA and DES to RPLB3 // Cath 11/21: Patent RCA stent, patent RPL stent; medical therapy   . GERD (gastroesophageal reflux disease)   . History of echocardiogram    Echo 6/16:  EF 50-55%, no RWMA, Gr 1 DD  . HLD (hyperlipidemia)    Archie Endo 03/15/2017  . HTN (hypertension)   . Hx of NSTEMI 5/16 tx with BMS to [facility] and DES to [facility] 09/10/2014  . Hyperglycemia   . OSA (obstructive sleep apnea)    "dx'd; couldn't tolerate mask" (03/15/2017)  . Tobacco use   . Type II diabetes mellitus ([place])   . Varicose veins of both lower extremities    S/P ablation 02/2017   Past Surgical History:  Procedure Laterality Date  . CARDIAC CATHETERIZATION N/A 09/11/2014   Procedure: Left Heart Cath and Coronary Angiography;  Surgeon: Leonie Man, MD;  Location: [place] CV LAB CUPID;  Service: Cardiovascular;  Laterality: N/A;  . CARDIAC CATHETERIZATION  09/11/2014   Procedure: Coronary Stent Intervention;  Surgeon: Leonie Man, MD;  Location: [facility] INVASIVE CV LAB CUPID;  Service: Cardiovascular;;  . ENDOVENOUS ABLATION SAPHENOUS VEIN W/ LASER Right 02/22/2017   endovenous laser ablation R GSV and stab phlebectomy >20 incisions R leg by Tinnie Gens MD   . LEFT HEART CATH AND CORONARY ANGIOGRAPHY N/A 04/01/2020   Procedure: LEFT HEART CATH AND CORONARY ANGIOGRAPHY;  Surgeon: Lorretta Harp, MD;  Location: [place] CV LAB;  Service: Cardiovascular;  Laterality: N/A;     Current Meds  Medication Sig  . acetaminophen (TYLENOL) 325 MG tablet Take 650 mg every 6 (six) hours as needed by mouth for mild pain.  . Blood Glucose Monitoring Suppl (TRUE METRIX METER) w/Device KIT Use as instructed. Check blood glucose level by fingerstick twice per day. E11.65  . chlorthalidone (HYGROTON) 25 MG tablet TAKE 1 TABLET BY MOUTH DAILY. TO REPLACE HCTZ  . clopidogrel (PLAVIX) 75 MG tablet TAKE 1  TABLET (75 MG TOTAL) BY MOUTH DAILY.  Marland Kitchen empagliflozin (JARDIANCE) 25 MG TABS tablet Take 1 tablet (25 mg total) by mouth daily before breakfast.  . glucose blood (TRUE METRIX BLOOD GLUCOSE TEST) test strip Use as instructed. Check blood glucose level by fingerstick twice per day. E11.65  . losartan (COZAAR) 100 MG tablet TAKE 1 TABLET (100 MG TOTAL) BY MOUTH DAILY.  Marland Kitchen Menthol, Topical Analgesic, (BIOFREEZE) 4 % GEL Apply 1 application topically once as needed (for pain).  . metFORMIN (GLUCOPHAGE) 500 MG tablet Take 1 tablet (500 mg total) by mouth 2 (two) times daily with a meal. Increase to 2 tablets (1000 mg) 2 times per day  in 2 weeks if able to tolerate.  . methocarbamol (ROBAXIN) 750 MG tablet Take 1 tablet (750 mg total) by mouth 2 (two) times daily as needed for muscle spasms (or muscle tightness).  . nitroGLYCERIN (NITROSTAT) 0.4 MG SL tablet Place 1 tablet (0.4 mg total) every 5 (five) minutes x 3 doses as needed under the tongue for chest pain.  Marland Kitchen oxyCODONE-acetaminophen (PERCOCET/ROXICET) 5-325 MG tablet Take 1-2 tablets by mouth every 6 (six) hours as needed for severe pain.  . rosuvastatin (CRESTOR) 40 MG tablet Take 1 tablet (40 mg total) by mouth daily.  . TRUEplus Lancets 28G MISC Use as instructed. Check blood glucose level by fingerstick twice per day. E11.65  . [DISCONTINUED] atorvastatin (LIPITOR) 80 MG tablet Take 1 tablet (80 mg total) by mouth daily.     Allergies:   Aspirin and Ibuprofen   Social History   Tobacco Use  . Smoking status: Current Every Day Smoker    Packs/day: 1.00    Years: 40.00    Pack years: 40.00    Types: Cigarettes  . Smokeless tobacco: Former Systems developer    Types: Secondary school teacher  . Vaping Use: Never used  Substance Use Topics  . Alcohol use: Yes    Alcohol/week: 6.0 standard drinks    Types: 6 Cans of beer per week  . Drug use: No     Family Hx: The patient's family history includes Cancer in his maternal grandfather; Heart attack in his  maternal grandmother, maternal uncle, and paternal grandmother. There is no history of Stroke or Hypertension.  ROS:   Please see the history of present illness.       Labs/Other Tests and Data Reviewed:    EKG:  No ECG reviewed.  Recent Labs: 05/17/2020: Hemoglobin 13.8; Platelets 207 05/24/2020: ALT 18; BUN 19; Creatinine, Ser 1.10; Potassium 4.2; Sodium 138   Recent Lipid Panel Lab Results  Component Value Date/Time   CHOL 131 05/24/2020 10:09 AM   TRIG 115 05/24/2020 10:09 AM   HDL 33 (L) 05/24/2020 10:09 AM   CHOLHDL 4.0 05/24/2020 10:09 AM   CHOLHDL 6.8 03/16/2017 04:39 AM   LDLCALC 77 05/24/2020 10:09 AM    Wt Readings from Last 3 Encounters:  05/27/20 290 lb (131.5 kg)  05/17/20 290 lb (131.5 kg)  04/01/20 298 lb (135.2 kg)     Risk Assessment/Calculations:      Objective:    Vital Signs:  BP 133/70   Pulse 77   Ht '6\' 3"'  (1.905 m)   Wt 290 lb (131.5 kg)   BMI 36.25 kg/m    VITAL SIGNS:  reviewed GEN:  no acute distress EYES:  Sclera anicteric RESPIRATORY:  Normal respiratory effort PSYCH:  normal affect  ASSESSMENT & PLAN:    1. Coronary artery disease involving native coronary artery of native heart without angina pectoris History of non-STEMI in 5/16 treated with a BMS to the RCA and DES to the RPL B3.  Recent cardiac catheterization in 11/21 demonstrated patent stents in the RCA and no significant disease elsewhere.  He is doing well without anginal symptoms.  Continue statin, clopidogrel.  Follow-up 6 months.  2. Essential hypertension Blood pressure close to goal.  He is in a significant mount of pain with his back.  Continue to monitor for now.  3. Mixed hyperlipidemia Recent LDL 77.  We discussed the importance of maintaining an LDL <70.  DC atorvastatin.  Start rosuvastatin 40 mg daily.  Obtain lipids, LFTs in 3  months.  If LDL still >70, add ezetimibe.  4. Aortic atherosclerosis ([place]) Noted on CT scan.  Continue clopidogrel, statin  therapy.  5. AAA (abdominal aortic aneurysm) without rupture (HCC) 3.8 cm by recent CT scan.  Obtain AAA ultrasound in 2 years as recommended.  6. Lung nodules Follow-up with PCP.  Repeat CT scan will be arranged through primary care.  We discussed the importance of quitting smoking.    Time:   Today, I have spent 10 minutes with the patient with telehealth technology discussing the above problems.     Medication Adjustments/Labs and Tests Ordered: Current medicines are reviewed at length with the patient today.  Concerns regarding medicines are outlined above.   Tests Ordered: Orders Placed This Encounter  Procedures  . Lipid panel  . Hepatic function panel  . VAS [place] AAA DUPLEX    Medication Changes: Meds ordered this encounter  Medications  . rosuvastatin (CRESTOR) 40 MG tablet    Sig: Take 1 tablet (40 mg total) by mouth daily.    Dispense:  90 tablet    Refill:  3    Follow Up:  In Person in 6 month(s)  Signed, Richardson Dopp, PA-C  05/27/2020 1:40 PM    [facility]

## 2020-05-27 ENCOUNTER — Encounter: Payer: Self-pay | Admitting: Physician Assistant

## 2020-05-27 ENCOUNTER — Telehealth: Payer: Self-pay | Admitting: *Deleted

## 2020-05-27 ENCOUNTER — Telehealth (INDEPENDENT_AMBULATORY_CARE_PROVIDER_SITE_OTHER): Payer: Self-pay | Admitting: Physician Assistant

## 2020-05-27 ENCOUNTER — Other Ambulatory Visit: Payer: Self-pay | Admitting: Physician Assistant

## 2020-05-27 ENCOUNTER — Other Ambulatory Visit: Payer: Self-pay

## 2020-05-27 VITALS — BP 133/70 | HR 77 | Ht 75.0 in | Wt 290.0 lb

## 2020-05-27 DIAGNOSIS — Z720 Tobacco use: Secondary | ICD-10-CM

## 2020-05-27 DIAGNOSIS — G4733 Obstructive sleep apnea (adult) (pediatric): Secondary | ICD-10-CM

## 2020-05-27 DIAGNOSIS — I2510 Atherosclerotic heart disease of native coronary artery without angina pectoris: Secondary | ICD-10-CM

## 2020-05-27 DIAGNOSIS — I10 Essential (primary) hypertension: Secondary | ICD-10-CM

## 2020-05-27 DIAGNOSIS — I700 Atherosclerosis of aorta: Secondary | ICD-10-CM

## 2020-05-27 DIAGNOSIS — R918 Other nonspecific abnormal finding of lung field: Secondary | ICD-10-CM

## 2020-05-27 DIAGNOSIS — E782 Mixed hyperlipidemia: Secondary | ICD-10-CM

## 2020-05-27 DIAGNOSIS — I714 Abdominal aortic aneurysm, without rupture, unspecified: Secondary | ICD-10-CM

## 2020-05-27 MED ORDER — ROSUVASTATIN CALCIUM 40 MG PO TABS: 40.0000 mg | ORAL_TABLET | Freq: Every day | ORAL | 3 refills | Status: DC

## 2020-05-27 MED FILL — ROSUVASTATIN CALCIUM 40 MG: 40 | 30 days supply | Qty: 30 | Fill #0

## 2020-05-27 MED FILL — !TRUE METRIX BLOOD GLUCOSE: 30 days supply | Qty: 1 | Fill #0

## 2020-05-27 MED FILL — METFORMIN HCL 500 MG TABS: 500 | 30 days supply | Qty: 120 | Fill #0

## 2020-05-27 MED FILL — TRUE METRIX GLUCOSE TEST ST: 25 days supply | Qty: 50 | Fill #0

## 2020-05-27 MED FILL — TRUEplus LANCETS 28G MISC: 50 days supply | Qty: 100 | Fill #0

## 2020-05-27 NOTE — Telephone Encounter (Signed)
  Patient Consent for Virtual Visit            CONSENT FOR VIRTUAL VISIT FOR:  William Young  By participating in this virtual visit I agree to the following:  I hereby voluntarily request, consent and authorize [facility] and its employed or contracted physicians, physician assistants, nurse practitioners or other licensed health care professionals (the Practitioner), to provide me with telemedicine health care services (the "Services") as deemed necessary by the treating Practitioner. I acknowledge and consent to receive the Services by the Practitioner via telemedicine. I understand that the telemedicine visit will involve communicating with the Practitioner through live audiovisual communication technology and the disclosure of certain medical information by electronic transmission. I acknowledge that I have been given the opportunity to request an in-person assessment or other available alternative prior to the telemedicine visit and am voluntarily participating in the telemedicine visit.  I understand that I have the right to withhold or withdraw my consent to the use of telemedicine in the course of my care at any time, without affecting my right to future care or treatment, and that the Practitioner or I may terminate the telemedicine visit at any time. I understand that I have the right to inspect all information obtained and/or recorded in the course of the telemedicine visit and may receive copies of available information for a reasonable fee.  I understand that some of the potential risks of receiving the Services via telemedicine include:  Marland Kitchen Delay or interruption in medical evaluation due to technological equipment failure or disruption; . Information transmitted may not be sufficient (e.g. poor resolution of images) to allow for appropriate medical decision making by the Practitioner; and/or  . In rare instances, security protocols could fail, causing a breach of personal health  information.  Furthermore, I acknowledge that it is my responsibility to provide information about my medical history, conditions and care that is complete and accurate to the best of my ability. I acknowledge that Practitioner's advice, recommendations, and/or decision may be based on factors not within their control, such as incomplete or inaccurate data provided by me or distortions of diagnostic images or specimens that may result from electronic transmissions. I understand that the practice of medicine is not an exact science and that Practitioner makes no warranties or guarantees regarding treatment outcomes. I acknowledge that a copy of this consent can be made available to me via my patient portal [facility] MyChart), or I can request a printed copy by calling the office of [facility].    I understand that my insurance will be billed for this visit.   I have read or had this consent read to me. . I understand the contents of this consent, which adequately explains the benefits and risks of the Services being provided via telemedicine.  . I have been provided ample opportunity to ask questions regarding this consent and the Services and have had my questions answered to my satisfaction. . I give my informed consent for the services to be provided through the use of telemedicine in my medical care

## 2020-05-27 NOTE — Patient Instructions (Signed)
Medication Instructions:  Your physician has recommended you make the following change in your medication:   1) Stop Atorvastatin 2) Start Rosuvastatin 40 mg, 1 tablet by mouth once a day  *If you need a refill on your cardiac medications before your next appointment, please call your pharmacy*  Lab Work: Your physician recommends that you return for lab work in: 3 months on 08/26/20  Testing/Procedures: Your physician has requested that you have an abdominal aorta duplex in 2024. During this test, an ultrasound is used to evaluate the aorta. Allow 30 minutes for this exam. Do not eat after midnight the day before and avoid carbonated beverages  Follow-Up: At [facility], you and your health needs are our priority.  As part of our continuing mission to provide you with exceptional heart care, we have created designated Provider Care Teams.  These Care Teams include your primary Cardiologist (physician) and Advanced Practice Providers (APPs -  Physician Assistants and Nurse Practitioners) who all work together to provide you with the care you need, when you need it.  Your next appointment:   6 month(s)  The format for your next appointment:   In Person  Provider:   You may see Dietrich Pates, MD or [name], PA-C

## 2020-05-28 MED FILL — JARDIANCE 25 MG TABLET: 25 | 14 days supply | Qty: 14 | Fill #0

## 2020-05-29 MED FILL — ?CHLORTHALIDONE 25 MG TABLE: 25 | 30 days supply | Qty: 30 | Fill #2

## 2020-05-29 MED FILL — LOSARTAN POTASSIUM 100 MG T: 100 | 30 days supply | Qty: 30 | Fill #8

## 2020-06-06 ENCOUNTER — Encounter: Payer: Self-pay | Admitting: Emergency Medicine

## 2020-06-10 ENCOUNTER — Other Ambulatory Visit: Payer: Self-pay | Admitting: Physician Assistant

## 2020-06-10 MED FILL — JARDIANCE 25 MG TABLET: 25 | 30 days supply | Qty: 30 | Fill #1

## 2020-06-10 MED FILL — ?CLOPIDOGREL 75 MG TABL: 75 | 30 days supply | Qty: 30 | Fill #0

## 2020-06-12 ENCOUNTER — Encounter: Payer: Self-pay | Admitting: Physician Assistant

## 2020-06-12 ENCOUNTER — Ambulatory Visit (HOSPITAL_COMMUNITY)
Admission: RE | Admit: 2020-06-12 | Discharge: 2020-06-12 | Disposition: A | Discharge status: Home / Self Care | Payer: Self-pay | Source: Ambulatory Visit | Attending: Cardiovascular Disease | Admitting: Cardiovascular Disease

## 2020-06-12 ENCOUNTER — Other Ambulatory Visit: Payer: Self-pay | Admitting: Physician Assistant

## 2020-06-12 ENCOUNTER — Other Ambulatory Visit: Payer: Self-pay

## 2020-06-12 DIAGNOSIS — I714 Abdominal aortic aneurysm, without rupture, unspecified: Secondary | ICD-10-CM | POA: Insufficient documentation

## 2020-06-20 ENCOUNTER — Telehealth: Payer: Self-pay | Admitting: Internal Medicine

## 2020-06-20 DIAGNOSIS — M79661 Pain in right lower leg: Secondary | ICD-10-CM

## 2020-06-20 DIAGNOSIS — M79605 Pain in left leg: Secondary | ICD-10-CM

## 2020-06-20 DIAGNOSIS — M79604 Pain in right leg: Secondary | ICD-10-CM

## 2020-06-20 NOTE — Telephone Encounter (Signed)
Spoke with pt and went over results of AAA.  Pt states that he is having pain in right buttock, calf and leg.  Advised I will place order for testing so that we can see what may be going on.  Pt agreeable to plan. Orders placed for LEA with ABIs.

## 2020-06-20 NOTE — Telephone Encounter (Signed)
Patient returning a call to discuss AAA Duplex results

## 2020-06-26 ENCOUNTER — Institutional Professional Consult (permissible substitution): Payer: Self-pay | Admitting: Emergency Medicine

## 2020-06-27 ENCOUNTER — Other Ambulatory Visit: Payer: Self-pay | Admitting: Internal Medicine

## 2020-06-27 MED FILL — ROSUVASTATIN CALCIUM 40 MG: 40 | 30 days supply | Qty: 30 | Fill #1

## 2020-06-27 MED FILL — LOSARTAN POTASSIUM 100 MG T: 100 | 30 days supply | Qty: 30 | Fill #0

## 2020-06-27 MED FILL — ?CHLORTHALIDONE 25 MG TABLE: 25 | 30 days supply | Qty: 30 | Fill #3

## 2020-07-05 ENCOUNTER — Other Ambulatory Visit: Payer: Self-pay | Admitting: Nurse Practitioner

## 2020-07-05 ENCOUNTER — Encounter: Payer: Self-pay | Admitting: Nurse Practitioner

## 2020-07-05 ENCOUNTER — Ambulatory Visit: Payer: Self-pay | Attending: Nurse Practitioner | Admitting: Nurse Practitioner

## 2020-07-05 ENCOUNTER — Other Ambulatory Visit: Payer: Self-pay

## 2020-07-05 VITALS — BP 120/74 | HR 67 | Temp 97.6°F | Ht 75.5 in | Wt 295.0 lb

## 2020-07-05 DIAGNOSIS — D696 Thrombocytopenia, unspecified: Secondary | ICD-10-CM

## 2020-07-05 DIAGNOSIS — I5032 Chronic diastolic (congestive) heart failure: Secondary | ICD-10-CM

## 2020-07-05 DIAGNOSIS — G8929 Other chronic pain: Secondary | ICD-10-CM

## 2020-07-05 DIAGNOSIS — Z1211 Encounter for screening for malignant neoplasm of colon: Secondary | ICD-10-CM

## 2020-07-05 DIAGNOSIS — M5441 Lumbago with sciatica, right side: Secondary | ICD-10-CM

## 2020-07-05 DIAGNOSIS — E1165 Type 2 diabetes mellitus with hyperglycemia: Secondary | ICD-10-CM

## 2020-07-05 LAB — GLUCOSE, POCT (MANUAL RESULT ENTRY): POC Glucose: 229 mg/dl — AB (ref 70–99)

## 2020-07-05 MED ORDER — METFORMIN HCL 500 MG PO TABS: 1000.0000 mg | ORAL_TABLET | Freq: Two times a day (BID) | ORAL | 0 refills | Status: DC

## 2020-07-05 MED ORDER — ACETAMINOPHEN-CODEINE #3 300-30 MG PO TABS: 1.0000 | ORAL_TABLET | Freq: Three times a day (TID) | ORAL | 0 refills | Status: DC | PRN

## 2020-07-05 MED FILL — ACETAMINOPHEN-COD #3 TABLET: 300-30 | 10 days supply | Qty: 30 | Fill #0

## 2020-07-05 MED FILL — METFORMIN HCL 500 MG TABS: 500 | 30 days supply | Qty: 120 | Fill #0

## 2020-07-05 NOTE — Progress Notes (Signed)
Assessment & Plan:  Diagnoses and all orders for this visit:  Type 2 diabetes mellitus with hyperglycemia, without long-term current use of insulin (HCC) -     metFORMIN (GLUCOPHAGE) 500 MG tablet; Take 2 tablets (1,000 mg total) by mouth 2 (two) times daily with a meal. -     Glucose (CBG) -     Ambulatory referral to Ophthalmology Continue blood sugar control as discussed in office today, low carbohydrate diet, and regular physical exercise as tolerated, 150 minutes per week (30 min each day, 5 days per week, or 50 min 3 days per week). Keep blood sugar logs with fasting goal of 90-130 mg/dl, post prandial (after you eat) less than 180.  For Hypoglycemia: BS <60 and Hyperglycemia BS >400; contact the clinic ASAP. Annual eye exams and foot exams are recommended.   Chronic right-sided low back pain with right-sided sciatica -     acetaminophen-codeine (TYLENOL #3) 300-30 MG tablet; Take 1 tablet by mouth every 8 (eight) hours as needed for moderate pain. Temporary fill at this time Work on losing weight to help reduce back pain. May alternate with heat and ice application for pain relief. Other alternatives include massage, acupuncture and water aerobics.  You must stay active and avoid a sedentary lifestyle.  Colon cancer screening -     Fecal occult blood, imunochemical([facility])    Patient has been counseled on age-appropriate routine health concerns for screening and prevention. These are reviewed and up-to-date. Referrals have been placed accordingly. Immunizations are up-to-date or declined.    Subjective:  HPI William Young 60 y.o. male presents to office today for follow up. He has a past medical history of Abdominal aortic aneurysm 3.8 cm, Aortic atherosclerosis, CAD (09/2014), GERD,  HLD, HTN, Hx of NSTEMI 5/16 tx with BMS to [facility] and DES to RPLB3 (09/10/2014), OSA, Tobacco use, Type II diabetes mellitus, and Varicose veins of both lower extremities.  CT showing  right lobe pulmonary nodules. Will need to repeat in 6 months (11-2021).    He is trying to quit smoking. Down from 7ppd a week to 3ppd.    Chronic Back Pain He has low back pain with right sided sciatica radiating down to his right calf. ABIs ordered by Cardiology are pending. He denies involuntary loss of bladder or stool.    DM 2 He is reluctant to start injectables today. Will increase metformin to 1000 mg BID and he will continue jardiance 25 mg daily. Diabetes is poorly controlled. He will need to start injectable or insulin if a1c not at goal next office visit. LDL nearing goal. He was switched from atorvastatin to rosuvastatin.  Lab Results  Component Value Date   HGBA1C 10.8 (H) 05/24/2020   Lab Results  Component Value Date   LDLCALC 77 05/24/2020    Essential Hypertension Blood pressure is well controlled. He is taking chlorthalidone 25 mg daily and osartan 100 mg daily. Denies chest pain, shortness of breath, palpitations, lightheadedness, dizziness, headaches or BLE edema.  BP Readings from Last 3 Encounters:  07/05/20 120/74  05/27/20 133/70  05/18/20 117/70   Review of Systems  Constitutional: Negative for fever, malaise/fatigue and weight loss.  HENT: Negative.  Negative for nosebleeds.   Eyes: Negative.  Negative for blurred vision, double vision and photophobia.  Respiratory: Negative.  Negative for cough and shortness of breath.   Cardiovascular: Negative.  Negative for chest pain, palpitations and leg swelling.  Gastrointestinal: Negative.  Negative for heartburn, nausea and vomiting.  Musculoskeletal: Positive for back pain. Negative for myalgias.  Neurological: Positive for sensory change. Negative for dizziness, focal weakness, seizures and headaches.  Psychiatric/Behavioral: Negative.  Negative for suicidal ideas.    Past Medical History:  Diagnosis Date  . Abdominal aortic aneurysm (AAA) ([place])    CT 1/22: infrarenal 3.8 cm - repeat US in 2 years //  AAA [place] 2/22: AAA 3.8 cm; aorto iliac atherosclerosis without hemodynamically significant stenosis  . Aortic atherosclerosis ([place])    CT in 03/2020  . CAD (coronary artery disease) 09/2014   a. NSTEMI >> LHC 5/16:  mLAD 15, pRCA 90, mRCA 40, RPLB3 80, EF normal with inf HK >> PCI:  BMS to pRCA and DES to RPLB3 // Cath 11/21: Patent RCA stent, patent RPL stent; medical therapy   . GERD (gastroesophageal reflux disease)   . History of echocardiogram    Echo 6/16:  EF 50-55%, no RWMA, Gr 1 DD  . HLD (hyperlipidemia)    Archie Endo 03/15/2017  . HTN (hypertension)   . Hx of NSTEMI 5/16 tx with BMS to [facility] and DES to [facility] 09/10/2014  . Hyperglycemia   . OSA (obstructive sleep apnea)    "dx'd; couldn't tolerate mask" (03/15/2017)  . Tobacco use   . Type II diabetes mellitus ([place])   . Varicose veins of both lower extremities    S/P ablation 02/2017    Past Surgical History:  Procedure Laterality Date  . CARDIAC CATHETERIZATION N/A 09/11/2014   Procedure: Left Heart Cath and Coronary Angiography;  Surgeon: Leonie Man, MD;  Location: [place] CV LAB CUPID;  Service: Cardiovascular;  Laterality: N/A;  . CARDIAC CATHETERIZATION  09/11/2014   Procedure: Coronary Stent Intervention;  Surgeon: Leonie Man, MD;  Location: [facility] INVASIVE CV LAB CUPID;  Service: Cardiovascular;;  . ENDOVENOUS ABLATION SAPHENOUS VEIN W/ LASER Right 02/22/2017   endovenous laser ablation R GSV and stab phlebectomy >20 incisions R leg by Tinnie Gens MD   . LEFT HEART CATH AND CORONARY ANGIOGRAPHY N/A 04/01/2020   Procedure: LEFT HEART CATH AND CORONARY ANGIOGRAPHY;  Surgeon: Lorretta Harp, MD;  Location: [place] CV LAB;  Service: Cardiovascular;  Laterality: N/A;    Family History  Problem Relation Age of Onset  . Heart attack Maternal Uncle   . Heart attack Paternal Grandmother   . Cancer Maternal Grandfather   . Heart attack Maternal Grandmother   . Stroke Neg Hx   . Hypertension Neg Hx     Social  History Reviewed with no changes to be made today.   Outpatient Medications Prior to Visit  Medication Sig Dispense Refill  . Blood Glucose Monitoring Suppl (TRUE METRIX METER) w/Device KIT Use as instructed. Check blood glucose level by fingerstick twice per day. E11.65 1 kit 0  . chlorthalidone (HYGROTON) 25 MG tablet TAKE 1 TABLET BY MOUTH DAILY. TO REPLACE HCTZ 90 tablet 3  . clopidogrel (PLAVIX) 75 MG tablet TAKE 1 TABLET (75 MG TOTAL) BY MOUTH DAILY. 90 tablet 3  . empagliflozin (JARDIANCE) 25 MG TABS tablet Take 1 tablet (25 mg total) by mouth daily before breakfast. 30 tablet 1  . glucose blood (TRUE METRIX BLOOD GLUCOSE TEST) test strip Use as instructed. Check blood glucose level by fingerstick twice per day. E11.65 100 each 12  . losartan (COZAAR) 100 MG tablet TAKE 1 TABLET (100 MG TOTAL) BY MOUTH DAILY. 90 tablet 3  . Menthol, Topical Analgesic, (BIOFREEZE) 4 % GEL Apply 1 application topically once as needed (for  pain).    . nitroGLYCERIN (NITROSTAT) 0.4 MG SL tablet Place 1 tablet (0.4 mg total) every 5 (five) minutes x 3 doses as needed under the tongue for chest pain. 25 tablet 12  . rosuvastatin (CRESTOR) 40 MG tablet Take 1 tablet (40 mg total) by mouth daily. 90 tablet 3  . TRUEplus Lancets 28G MISC Use as instructed. Check blood glucose level by fingerstick twice per day. E11.65 100 each 3  . acetaminophen (TYLENOL) 325 MG tablet Take 650 mg every 6 (six) hours as needed by mouth for mild pain.    . metFORMIN (GLUCOPHAGE) 500 MG tablet Take 1 tablet (500 mg total) by mouth 2 (two) times daily with a meal. Increase to 2 tablets (1000 mg) 2 times per day in 2 weeks if able to tolerate. 180 tablet 3  . methocarbamol (ROBAXIN) 750 MG tablet Take 1 tablet (750 mg total) by mouth 2 (two) times daily as needed for muscle spasms (or muscle tightness). (Patient not taking: Reported on 07/05/2020) 20 tablet 0  . oxyCODONE-acetaminophen (PERCOCET/ROXICET) 5-325 MG tablet Take 1-2 tablets by  mouth every 6 (six) hours as needed for severe pain. (Patient not taking: Reported on 07/05/2020) 10 tablet 0   No facility-administered medications prior to visit.    Allergies  Allergen Reactions  . Aspirin Anaphylaxis, Swelling and Rash    Throat closes   . Ibuprofen Anaphylaxis, Swelling and Rash       Objective:    BP 120/74 (BP Location: Left Arm, Patient Position: Sitting, Cuff Size: Large)   Pulse 67   Temp 97.6 F (36.4 C) (Oral)   Ht 6' 3.5" (1.918 m)   Wt 295 lb (133.8 kg)   SpO2 98%   BMI 36.39 kg/m  Wt Readings from Last 3 Encounters:  07/05/20 295 lb (133.8 kg)  05/27/20 290 lb (131.5 kg)  05/17/20 290 lb (131.5 kg)    Physical Exam Vitals and nursing note reviewed.  Constitutional:      Appearance: He is well-developed and well-nourished.  HENT:     Head: Normocephalic and atraumatic.  Eyes:     Extraocular Movements: EOM normal.  Cardiovascular:     Rate and Rhythm: Normal rate and regular rhythm.     Pulses: Intact distal pulses.     Heart sounds: Normal heart sounds. No murmur heard. No friction rub. No gallop.   Pulmonary:     Effort: Pulmonary effort is normal. No tachypnea or respiratory distress.     Breath sounds: Normal breath sounds. No decreased breath sounds, wheezing, rhonchi or rales.  Chest:     Chest wall: No tenderness.  Abdominal:     General: Bowel sounds are normal.     Palpations: Abdomen is soft.  Musculoskeletal:        General: No swelling, tenderness or edema.     Cervical back: Normal range of motion.     Right lower leg: No edema.     Left lower leg: No edema.  Skin:    General: Skin is warm and dry.  Neurological:     Mental Status: He is alert and oriented to person, place, and time.     Coordination: Coordination normal.  Psychiatric:        Mood and Affect: Mood and affect normal.        Behavior: Behavior normal. Behavior is cooperative.        Thought Content: Thought content normal.        Judgment:  Judgment  normal.          Patient has been counseled extensively about nutrition and exercise as well as the importance of adherence with medications and regular follow-up. The patient was given clear instructions to go to ER or return to medical center if symptoms don't improve, worsen or new problems develop. The patient verbalized understanding.   Follow-up: Return in about 3 months (around 10/02/2020).   Gildardo Pounds, FNP-BC [facility] and [facility], [place]   07/06/2020, 9:19 AM

## 2020-07-06 ENCOUNTER — Encounter: Payer: Self-pay | Admitting: Nurse Practitioner

## 2020-07-06 DIAGNOSIS — I5032 Chronic diastolic (congestive) heart failure: Secondary | ICD-10-CM | POA: Insufficient documentation

## 2020-07-09 ENCOUNTER — Other Ambulatory Visit: Payer: Self-pay

## 2020-07-09 ENCOUNTER — Ambulatory Visit (HOSPITAL_COMMUNITY)
Admission: RE | Admit: 2020-07-09 | Discharge: 2020-07-09 | Disposition: A | Discharge status: Home / Self Care | Payer: Self-pay | Source: Ambulatory Visit | Attending: Cardiology | Admitting: Cardiology

## 2020-07-09 DIAGNOSIS — M79661 Pain in right lower leg: Secondary | ICD-10-CM

## 2020-07-09 DIAGNOSIS — M79604 Pain in right leg: Secondary | ICD-10-CM

## 2020-07-09 DIAGNOSIS — M79605 Pain in left leg: Secondary | ICD-10-CM

## 2020-07-10 ENCOUNTER — Other Ambulatory Visit: Payer: Self-pay | Admitting: Nurse Practitioner

## 2020-07-10 MED FILL — $JARDIANCE 25 MG TABLET: 25 | 30 days supply | Qty: 30 | Fill #0

## 2020-07-10 MED FILL — ?CLOPIDOGREL 75MG TABL: 75 | 30 days supply | Qty: 30 | Fill #1

## 2020-07-11 ENCOUNTER — Encounter: Payer: Self-pay | Admitting: Physician Assistant

## 2020-07-11 DIAGNOSIS — I739 Peripheral vascular disease, unspecified: Secondary | ICD-10-CM | POA: Insufficient documentation

## 2020-07-12 ENCOUNTER — Other Ambulatory Visit: Payer: Self-pay

## 2020-07-12 ENCOUNTER — Ambulatory Visit (INDEPENDENT_AMBULATORY_CARE_PROVIDER_SITE_OTHER): Payer: Self-pay | Admitting: Emergency Medicine

## 2020-07-12 ENCOUNTER — Encounter: Payer: Self-pay | Admitting: Emergency Medicine

## 2020-07-12 DIAGNOSIS — R918 Other nonspecific abnormal finding of lung field: Secondary | ICD-10-CM

## 2020-07-12 DIAGNOSIS — Z720 Tobacco use: Secondary | ICD-10-CM

## 2020-07-12 NOTE — Patient Instructions (Signed)
Please work hard on decreasing your smoking.  Try to get down to 1/4 pack/day by her next visit. You might benefit from checking pulmonary function testing at some point in the future if your breathing changes We will repeat your CT scan of the chest without contrast to follow your small pulmonary nodules in January 2023. Follow with Dr. Delton Coombes in 1 year after your CT scan so that we can review the results together.

## 2020-07-12 NOTE — Assessment & Plan Note (Signed)
We will repeat your CT scan of the chest without contrast to follow your small pulmonary nodules in January 2023. Follow with Dr. Delton Coombes in 1 year after your CT scan so that we can review the results together.

## 2020-07-12 NOTE — Assessment & Plan Note (Signed)
Please work hard on decreasing your smoking.  Try to get down to 1/4 pack/day by her next visit. You might benefit from checking pulmonary function testing at some point in the future if your breathing changes

## 2020-07-12 NOTE — Progress Notes (Signed)
Subjective:    Patient ID: William Young, male    DOB: 05/03/1961, 60 y.o.   MRN: [id number]  HPI 60 year old man with history of tobacco use (60 pack years), CAD/non-STEMI/PCI, hypertension, OSA not on CPAP, AAA, diabetes.  He is referred today for evaluation of an abnormal CT scan of the chest.  A CT angio of his chest abdomen pelvis was performed on 05/18/2020 when he developed low back pain and the right lower extremity neuro changes.  I have reviewed.  This shows no significant mediastinal adenopathy, a 6 x 5 mm posterior right upper lobe pulmonary nodule and a 3 mm right middle lobe nodule  He reports that he still has the back and RLE pain - sounds like sciatica. He is not limited by his breathing. No cough, no wheeze.    Review of Systems As per HPI  Past Medical History:  Diagnosis Date  . Abdominal aortic aneurysm (AAA) ([place])    CT 1/22: infrarenal 3.8 cm - repeat US in 2 years // AAA [place] 2/22: AAA 3.8 cm; aorto iliac atherosclerosis without hemodynamically significant stenosis  . Aortic atherosclerosis ([place])    CT in 03/2020  . CAD (coronary artery disease) 09/2014   a. NSTEMI >> LHC 5/16:  mLAD 15, pRCA 90, mRCA 40, RPLB3 80, EF normal with inf HK >> PCI:  BMS to pRCA and DES to RPLB3 // Cath 11/21: Patent RCA stent, patent RPL stent; medical therapy   . GERD (gastroesophageal reflux disease)   . History of echocardiogram    Echo 6/16:  EF 50-55%, no RWMA, Gr 1 DD  . HLD (hyperlipidemia)    Archie Endo 03/15/2017  . HTN (hypertension)   . Hx of NSTEMI 5/16 tx with BMS to [facility] and DES to [facility] 09/10/2014  . Hyperglycemia   . OSA (obstructive sleep apnea)    "dx'd; couldn't tolerate mask" (03/15/2017)  . PAD (peripheral artery disease) ([place])    AAA [place] in 06/2020: aorto iliac atherosclerosis  . Tobacco use   . Type II diabetes mellitus ([place])   . Varicose veins of both lower extremities    S/P ablation 02/2017     Family History  Problem Relation Age of Onset  . Heart attack  Maternal Uncle   . Heart attack Paternal Grandmother   . Cancer Maternal Grandfather   . Heart attack Maternal Grandmother   . Stroke Neg Hx   . Hypertension Neg Hx      Social History   Socioeconomic History  . Marital status: Widowed    Spouse name: Not on file  . Number of children: Not on file  . Years of education: Not on file  . Highest education level: Not on file  Occupational History  . Occupation: Production manager systems  Tobacco Use  . Smoking status: Current Every Day Smoker    Packs/day: 1.50    Years: 40.00    Pack years: 60.00    Types: Cigarettes    Start date: 33  . Smokeless tobacco: Former Systems developer    Types: Chew  . Tobacco comment: currently smoking 0.5ppd as of 07/12/20  Vaping Use  . Vaping Use: Never used  Substance and Sexual Activity  . Alcohol use: Yes    Alcohol/week: 6.0 standard drinks    Types: 6 Cans of beer per week  . Drug use: No  . Sexual activity: Not Currently  Other Topics Concern  . Not on file  Social History Narrative   Lives with  fiance and 2 daughters. Neither his parents nor siblings have any cardiac issues that he knows of.   Social Determinants of Health   Financial Resource Strain: Not on file  Food Insecurity: Not on file  Transportation Needs: Not on file  Physical Activity: Not on file  Stress: Not on file  Social Connections: Not on file  Intimate Partner Violence: Not on file    He is from [place], has lived in [place], [place] Has worked as Freight forwarder, now a Physiological scientist He is smoking 1/2 pk/day Was in [employer], worked as a Training and development officer, never out of the country.  No other exposure.    Allergies  Allergen Reactions  . Aspirin Anaphylaxis, Swelling and Rash    Throat closes   . Ibuprofen Anaphylaxis, Swelling and Rash     Outpatient Medications Prior to Visit  Medication Sig Dispense Refill  . acetaminophen-codeine (TYLENOL #3) 300-30 MG tablet Take 1 tablet by mouth every 8 (eight) hours as needed for moderate  pain. 30 tablet 0  . Blood Glucose Monitoring Suppl (TRUE METRIX METER) w/Device KIT Use as instructed. Check blood glucose level by fingerstick twice per day. E11.65 1 kit 0  . chlorthalidone (HYGROTON) 25 MG tablet TAKE 1 TABLET BY MOUTH DAILY. TO REPLACE HCTZ 90 tablet 3  . clopidogrel (PLAVIX) 75 MG tablet TAKE 1 TABLET (75 MG TOTAL) BY MOUTH DAILY. 90 tablet 3  . glucose blood (TRUE METRIX BLOOD GLUCOSE TEST) test strip Use as instructed. Check blood glucose level by fingerstick twice per day. E11.65 100 each 12  . JARDIANCE 25 MG TABS tablet TAKE 1 TABLET (25 MG TOTAL) BY MOUTH DAILY BEFORE BREAKFAST. 30 tablet 1  . losartan (COZAAR) 100 MG tablet TAKE 1 TABLET (100 MG TOTAL) BY MOUTH DAILY. 90 tablet 3  . Menthol, Topical Analgesic, (BIOFREEZE) 4 % GEL Apply 1 application topically once as needed (for pain).    . metFORMIN (GLUCOPHAGE) 500 MG tablet Take 2 tablets (1,000 mg total) by mouth 2 (two) times daily with a meal. 360 tablet 0  . nitroGLYCERIN (NITROSTAT) 0.4 MG SL tablet Place 1 tablet (0.4 mg total) every 5 (five) minutes x 3 doses as needed under the tongue for chest pain. 25 tablet 12  . rosuvastatin (CRESTOR) 40 MG tablet Take 1 tablet (40 mg total) by mouth daily. 90 tablet 3  . TRUEplus Lancets 28G MISC Use as instructed. Check blood glucose level by fingerstick twice per day. E11.65 100 each 3   No facility-administered medications prior to visit.        Objective:   Physical Exam Vitals:   07/12/20 1014  BP: 134/80  Pulse: 78  SpO2: 98%  Weight: 296 lb 12.8 oz (134.6 kg)  Height: _0  (1.93 m)   Gen: Pleasant, obese man, in no distress,  normal affect  ENT: No lesions,  mouth clear, poor dentition, oropharynx clear, no postnasal drip  Neck: No JVD, no stridor  Lungs: No use of accessory muscles, distant but no crackles or wheezing on normal respiration, no wheeze on forced expiration  Cardiovascular: RRR, heart sounds normal, no murmur or gallops, no  peripheral edema  Musculoskeletal: No deformities, no cyanosis or clubbing  Neuro: alert, awake, non focal  Skin: Warm, no lesions or rash     Assessment & Plan:  Tobacco use Please work hard on decreasing your smoking.  Try to get down to 1/4 pack/day by her next visit. You might benefit from checking pulmonary function testing at  some point in the future if your breathing changes  Pulmonary nodules We will repeat your CT scan of the chest without contrast to follow your small pulmonary nodules in January 2023. Follow with Dr. Lamonte Sakai in 1 year after your CT scan so that we can review the results together.  Baltazar Apo, MD, PhD 07/12/2020, 10:49 AM [facility] [phone] or if no answer before 7:00PM call [phone] For any issues after 7:00PM please call eLink [phone]

## 2020-07-19 LAB — FECAL OCCULT BLOOD, IMMUNOCHEMICAL: Fecal Occult Bld: NEGATIVE

## 2020-07-29 MED FILL — ROSUVASTATIN CALCIUM 40 MG: 40 | 30 days supply | Qty: 30 | Fill #2

## 2020-07-29 MED FILL — LOSARTAN POTASSIUM 100 MG T: 100 | 30 days supply | Qty: 30 | Fill #1

## 2020-07-29 MED FILL — ?CHLORTHALIDONE 25 MG TABLE: 25 | 30 days supply | Qty: 30 | Fill #4

## 2020-08-06 ENCOUNTER — Other Ambulatory Visit: Payer: Self-pay | Admitting: Nurse Practitioner

## 2020-08-06 DIAGNOSIS — M5441 Lumbago with sciatica, right side: Secondary | ICD-10-CM

## 2020-08-06 DIAGNOSIS — G8929 Other chronic pain: Secondary | ICD-10-CM

## 2020-08-06 MED FILL — $JARDIANCE 25 MG TABLET: 25 | 30 days supply | Qty: 30 | Fill #1

## 2020-08-06 MED FILL — ?CLOPIDOGREL 75MG TABL: 75 | 30 days supply | Qty: 30 | Fill #2

## 2020-08-06 NOTE — Telephone Encounter (Signed)
Requested medication (s) are due for refill today:yes  Requested medication (s) are on the active medication list: NO  Last refill: 07/05/20  #30  0 refills  Future visit scheduled Yes 10/11/20  Notes to clinic:Not delegated .  Med is NOT on current profile  Requested Prescriptions  Pending Prescriptions Disp Refills   acetaminophen-codeine (TYLENOL #3) 300-30 MG tablet [Pharmacy Med Name: ACETAMINOPHEN-COD #3 TABLET 300-30 Tablet] 30 tablet 0    Sig: Take 1 tablet by mouth every 8 (eight) hours as needed for moderate pain.      Not Delegated - Analgesics:  Opioid Agonist Combinations Failed - 08/06/2020 12:48 PM      Failed - This refill cannot be delegated      Failed - Urine Drug Screen completed in last 360 days      Passed - Valid encounter within last 6 months    Recent Outpatient Visits           1 month ago Type 2 diabetes mellitus with hyperglycemia, without long-term current use of insulin [facility])   [place] [facility] [place], Shea Stakes, NP   2 months ago Encounter to establish care   [facility] [place], [place] W, NP   2 years ago Type 2 diabetes mellitus without complication, without long-term current use of insulin [facility])   [place] [facility] [place], Shea Stakes, NP   3 years ago Essential hypertension   [place] [address] [facility] [place], [place] R, FNP   4 years ago Type 2 diabetes mellitus without complication, without long-term current use of insulin [facility])   [place] [place], [place] T, MD       Future Appointments             In 2 months Claiborne Rigg, NP [facility]

## 2020-08-11 ENCOUNTER — Other Ambulatory Visit: Payer: Self-pay

## 2020-08-11 ENCOUNTER — Other Ambulatory Visit: Payer: Self-pay | Admitting: Pharmacy Technician

## 2020-08-12 ENCOUNTER — Other Ambulatory Visit: Payer: Self-pay | Admitting: Nurse Practitioner

## 2020-08-12 ENCOUNTER — Other Ambulatory Visit: Payer: Self-pay

## 2020-08-12 MED ORDER — ACETAMINOPHEN-CODEINE #3 300-30 MG PO TABS
1.0000 | ORAL_TABLET | Freq: Three times a day (TID) | ORAL | 0 refills | Status: AC | PRN
  Filled 2020-08-12: qty 30, 10d supply, fill #0

## 2020-08-26 ENCOUNTER — Other Ambulatory Visit: Payer: Self-pay

## 2020-08-26 ENCOUNTER — Other Ambulatory Visit: Payer: Self-pay | Admitting: *Deleted

## 2020-08-26 DIAGNOSIS — E782 Mixed hyperlipidemia: Secondary | ICD-10-CM

## 2020-08-26 DIAGNOSIS — I2510 Atherosclerotic heart disease of native coronary artery without angina pectoris: Secondary | ICD-10-CM

## 2020-08-26 LAB — HEPATIC FUNCTION PANEL
ALT: 21 IU/L (ref 0–44)
AST: 16 IU/L (ref 0–40)
Albumin: 4.6 g/dL (ref 3.8–4.9)
Alkaline Phosphatase: 91 IU/L (ref 44–121)
Bilirubin Total: 0.4 mg/dL (ref 0.0–1.2)
Bilirubin, Direct: 0.15 mg/dL (ref 0.00–0.40)
Total Protein: 7.1 g/dL (ref 6.0–8.5)

## 2020-08-26 LAB — LIPID PANEL
Chol/HDL Ratio: 4.2 ratio (ref 0.0–5.0)
Cholesterol, Total: 134 mg/dL (ref 100–199)
HDL: 32 mg/dL — ABNORMAL LOW (ref >39)
LDL Chol Calc (NIH): 79 mg/dL (ref 0–99)
Triglycerides: 125 mg/dL (ref 0–149)
VLDL Cholesterol Cal: 23 mg/dL (ref 5–40)

## 2020-08-27 ENCOUNTER — Other Ambulatory Visit: Payer: Self-pay

## 2020-08-27 MED FILL — Chlorthalidone Tab 25 MG: ORAL | 30 days supply | Qty: 30 | Fill #0 | Status: AC

## 2020-08-27 MED FILL — Rosuvastatin Calcium Tab 40 MG: ORAL | 30 days supply | Qty: 30 | Fill #0 | Status: AC

## 2020-08-28 ENCOUNTER — Other Ambulatory Visit: Payer: Self-pay

## 2020-08-28 MED FILL — Losartan Potassium Tab 100 MG: ORAL | 30 days supply | Qty: 30 | Fill #0 | Status: AC

## 2020-08-29 ENCOUNTER — Other Ambulatory Visit: Payer: Self-pay

## 2020-08-29 ENCOUNTER — Telehealth: Payer: Self-pay

## 2020-08-29 DIAGNOSIS — E782 Mixed hyperlipidemia: Secondary | ICD-10-CM

## 2020-08-29 NOTE — Telephone Encounter (Signed)
-----   Message from Creig Hines, NP sent at 08/29/2020  7:29 AM EDT ----- If pt not previously fasting, appropriate to obtain a fasting lipid panel (no need to repeat lfts at this time) prior to starting/prescribing zetia.  Thanks for the update.

## 2020-08-30 ENCOUNTER — Other Ambulatory Visit: Payer: Self-pay

## 2020-08-30 ENCOUNTER — Other Ambulatory Visit: Payer: Self-pay | Admitting: *Deleted

## 2020-08-30 DIAGNOSIS — E782 Mixed hyperlipidemia: Secondary | ICD-10-CM

## 2020-08-30 LAB — LIPID PANEL
Chol/HDL Ratio: 3.7 ratio (ref 0.0–5.0)
Cholesterol, Total: 142 mg/dL (ref 100–199)
HDL: 38 mg/dL — ABNORMAL LOW (ref >39)
LDL Chol Calc (NIH): 82 mg/dL (ref 0–99)
Triglycerides: 121 mg/dL (ref 0–149)
VLDL Cholesterol Cal: 22 mg/dL (ref 5–40)

## 2020-09-02 ENCOUNTER — Other Ambulatory Visit: Payer: Self-pay

## 2020-09-02 ENCOUNTER — Telehealth: Payer: Self-pay | Admitting: Nurse Practitioner

## 2020-09-02 DIAGNOSIS — E782 Mixed hyperlipidemia: Secondary | ICD-10-CM

## 2020-09-02 DIAGNOSIS — I2510 Atherosclerotic heart disease of native coronary artery without angina pectoris: Secondary | ICD-10-CM

## 2020-09-02 MED ORDER — EZETIMIBE 10 MG PO TABS
10.0000 mg | ORAL_TABLET | Freq: Every day | ORAL | 3 refills | Status: DC
  Filled 2020-09-02: qty 30, 30d supply, fill #0
  Filled 2020-09-30: qty 30, 30d supply, fill #1
  Filled 2020-11-01: qty 30, 30d supply, fill #2
  Filled 2020-12-04: qty 30, 30d supply, fill #3
  Filled 2020-12-28: qty 30, 30d supply, fill #4
  Filled 2021-02-01: qty 30, 30d supply, fill #5
  Filled 2021-03-04: qty 30, 30d supply, fill #6
  Filled 2021-04-03: qty 30, 30d supply, fill #7
  Filled 2021-05-06: qty 30, 30d supply, fill #8
  Filled 2021-06-03: qty 30, 30d supply, fill #9
  Filled 2021-06-04: qty 30, 30d supply, fill #0
  Filled 2021-07-06: qty 30, 30d supply, fill #1
  Filled 2021-08-05: qty 30, 30d supply, fill #2

## 2020-09-02 NOTE — Telephone Encounter (Signed)
-----   Message from Beatrice Lecher, PA-C sent at 08/31/2020  8:39 PM EDT ----- LDL above goal of < 70.  PLAN:  -Continue current dose of Rosuvastatin.   -Start Ezetimibe 10 mg once daily  -Fasting Lipids, LFTs in 3 mos.  [name], PA-C    08/31/2020 8:37 PM

## 2020-09-02 NOTE — Telephone Encounter (Signed)
Results and plan of care reviewed with patient who verbalized understanding and agreement. He is scheduled for repeat lab appointment on July 26. I advised him to call back prior to that date with any questions or concerns. He thanked me for the call.

## 2020-09-04 ENCOUNTER — Other Ambulatory Visit: Payer: Self-pay

## 2020-09-09 ENCOUNTER — Other Ambulatory Visit: Payer: Self-pay

## 2020-09-09 ENCOUNTER — Other Ambulatory Visit: Payer: Self-pay | Admitting: Nurse Practitioner

## 2020-09-09 MED ORDER — EMPAGLIFLOZIN 25 MG PO TABS
ORAL_TABLET | ORAL | 1 refills | Status: DC
  Filled 2020-09-09: qty 30, 30d supply, fill #0
  Filled 2020-10-09: qty 30, 30d supply, fill #1

## 2020-09-09 NOTE — Telephone Encounter (Signed)
Future visit in 1 month  

## 2020-09-09 NOTE — Telephone Encounter (Signed)
Requested medication (s) are due for refill today: yes  Requested medication (s) are on the active medication list: yes  Last refill:  08/12/20-09/11/20 #30 0 refills   Future visit scheduled: yes in 1 month  Notes to clinic:  not delegated per protocol     Requested Prescriptions  Pending Prescriptions Disp Refills   acetaminophen-codeine (TYLENOL #3) 300-30 MG tablet 30 tablet 0    Sig: Take 1 tablet by mouth every 8 (eight) hours as needed for moderate pain.      Not Delegated - Analgesics:  Opioid Agonist Combinations Failed - 09/09/2020  4:02 PM      Failed - This refill cannot be delegated      Failed - Urine Drug Screen completed in last 360 days      Passed - Valid encounter within last 6 months    Recent Outpatient Visits           2 months ago Type 2 diabetes mellitus with hyperglycemia, without long-term current use of insulin [facility])   [place], Vernia Buff, NP   3 months ago Encounter to establish care   [place], [place] W, NP   3 years ago Type 2 diabetes mellitus without complication, without long-term current use of insulin ([place])   [place], Vernia Buff, NP   3 years ago Essential hypertension   [place], [place], FNP   4 years ago Type 2 diabetes mellitus without complication, without long-term current use of insulin [facility])   [place], [place] T, MD       Future Appointments             In 1 month Gildardo Pounds, NP [place]              Signed Prescriptions Disp Refills   empagliflozin (JARDIANCE) 25 MG TABS tablet 30 tablet 1    Sig: TAKE 1 TABLET (25 MG TOTAL) BY MOUTH DAILY BEFORE BREAKFAST.      Endocrinology:  Diabetes - SGLT2 Inhibitors Failed - 09/09/2020  4:02 PM      Failed - HBA1C is between 0 and 7.9 and within  180 days    Hgb A1c MFr Bld  Date Value Ref Range Status  05/24/2020 10.8 (H) 4.8 - 5.6 % Final    Comment:             Prediabetes: 5.7 - 6.4          Diabetes: >6.4          Glycemic control for adults with diabetes: <7.0           Passed - Cr in normal range and within 360 days    Creat  Date Value Ref Range Status  12/27/2015 0.75 0.70 - 1.33 mg/dL Final    Comment:      For patients > or = 60 years of age: The upper reference limit for Creatinine is approximately 13% higher for people identified as African-American.      Creatinine, Ser  Date Value Ref Range Status  05/24/2020 1.10 0.76 - 1.27 mg/dL Final   Creatinine, Urine  Date Value Ref Range Status  11/18/2015 390 (H) 20 - 370 mg/dL Final    Comment:    Result confirmed by automatic dilution. Result repeated and verified.  Passed - LDL in normal range and within 360 days    LDL Chol Calc (NIH)  Date Value Ref Range Status  08/30/2020 82 0 - 99 mg/dL Final          Passed - eGFR in normal range and within 360 days    GFR calc Af Amer  Date Value Ref Range Status  05/24/2020 84 >59 mL/min/1.73 Final    Comment:    **In accordance with recommendations from the NKF-ASN Task force,**   [facility] is in the process of updating its eGFR calculation to the   2021 CKD-EPI creatinine equation that estimates kidney function   without a race variable.    GFR, Estimated  Date Value Ref Range Status  05/17/2020 >60 >60 mL/min Final    Comment:    (NOTE) Calculated using the CKD-EPI Creatinine Equation (2021)    GFR calc non Af Amer  Date Value Ref Range Status  05/24/2020 73 >59 mL/min/1.73 Final   GFR  Date Value Ref Range Status  11/26/2014 102.54 >60.00 mL/min Final          Passed - Valid encounter within last 6 months    Recent Outpatient Visits           2 months ago Type 2 diabetes mellitus with hyperglycemia, without long-term current use of insulin [facility])   [place], Vernia Buff, NP   3 months ago Encounter to establish care   [place], [place] W, NP   3 years ago Type 2 diabetes mellitus without complication, without long-term current use of insulin [facility])   [place], Vernia Buff, NP   3 years ago Essential hypertension   [place], FNP   4 years ago Type 2 diabetes mellitus without complication, without long-term current use of insulin [facility])   [place], [place] T, MD       Future Appointments             In 1 month Gildardo Pounds, NP [place]

## 2020-09-10 ENCOUNTER — Other Ambulatory Visit: Payer: Self-pay

## 2020-09-11 ENCOUNTER — Other Ambulatory Visit: Payer: Self-pay

## 2020-09-12 ENCOUNTER — Other Ambulatory Visit: Payer: Self-pay

## 2020-09-12 MED FILL — Clopidogrel Bisulfate Tab 75 MG (Base Equiv): ORAL | 30 days supply | Qty: 30 | Fill #0 | Status: AC

## 2020-09-13 ENCOUNTER — Telehealth: Payer: Self-pay | Admitting: Nurse Practitioner

## 2020-09-13 ENCOUNTER — Other Ambulatory Visit: Payer: Self-pay

## 2020-09-13 NOTE — Telephone Encounter (Signed)
Pt is needing refill on Tylenol #3

## 2020-09-13 NOTE — Telephone Encounter (Signed)
Pt fiance Crystal Tonny Bollman came by to get a refill on Tylenol 3 but it was discontinued. Please contact Crystal at [phone] if prescription can be refilled and when rx is sent to pharmacy

## 2020-09-16 ENCOUNTER — Other Ambulatory Visit: Payer: Self-pay | Admitting: Nurse Practitioner

## 2020-09-16 MED ORDER — ACETAMINOPHEN-CODEINE #3 300-30 MG PO TABS
1.0000 | ORAL_TABLET | Freq: Two times a day (BID) | ORAL | 0 refills | Status: DC | PRN
  Filled 2020-09-16 – 2020-09-26 (×2): qty 30, 15d supply, fill #0

## 2020-09-17 ENCOUNTER — Other Ambulatory Visit: Payer: Self-pay

## 2020-09-18 ENCOUNTER — Other Ambulatory Visit: Payer: Self-pay

## 2020-09-20 ENCOUNTER — Other Ambulatory Visit (HOSPITAL_COMMUNITY): Payer: Self-pay

## 2020-09-24 ENCOUNTER — Other Ambulatory Visit: Payer: Self-pay

## 2020-09-25 ENCOUNTER — Other Ambulatory Visit: Payer: Self-pay

## 2020-09-25 MED FILL — Metformin HCl Tab 500 MG: ORAL | 30 days supply | Qty: 120 | Fill #0 | Status: AC

## 2020-09-25 MED FILL — Chlorthalidone Tab 25 MG: ORAL | 30 days supply | Qty: 30 | Fill #1 | Status: AC

## 2020-09-25 MED FILL — Rosuvastatin Calcium Tab 40 MG: ORAL | 30 days supply | Qty: 30 | Fill #1 | Status: AC

## 2020-09-25 MED FILL — Losartan Potassium Tab 100 MG: ORAL | 30 days supply | Qty: 30 | Fill #1 | Status: AC

## 2020-09-26 ENCOUNTER — Other Ambulatory Visit: Payer: Self-pay

## 2020-10-01 ENCOUNTER — Other Ambulatory Visit: Payer: Self-pay

## 2020-10-09 ENCOUNTER — Other Ambulatory Visit: Payer: Self-pay

## 2020-10-09 MED FILL — Clopidogrel Bisulfate Tab 75 MG (Base Equiv): ORAL | 30 days supply | Qty: 30 | Fill #1 | Status: AC

## 2020-10-11 ENCOUNTER — Other Ambulatory Visit: Payer: Self-pay

## 2020-10-11 ENCOUNTER — Encounter: Payer: Self-pay | Admitting: Nurse Practitioner

## 2020-10-11 ENCOUNTER — Ambulatory Visit: Payer: Self-pay | Attending: Nurse Practitioner | Admitting: Nurse Practitioner

## 2020-10-11 VITALS — BP 128/85 | HR 79 | Ht 76.0 in | Wt 285.0 lb

## 2020-10-11 DIAGNOSIS — G8929 Other chronic pain: Secondary | ICD-10-CM

## 2020-10-11 DIAGNOSIS — E1165 Type 2 diabetes mellitus with hyperglycemia: Secondary | ICD-10-CM

## 2020-10-11 DIAGNOSIS — M5441 Lumbago with sciatica, right side: Secondary | ICD-10-CM

## 2020-10-11 DIAGNOSIS — E559 Vitamin D deficiency, unspecified: Secondary | ICD-10-CM

## 2020-10-11 DIAGNOSIS — I10 Essential (primary) hypertension: Secondary | ICD-10-CM

## 2020-10-11 LAB — POCT GLYCOSYLATED HEMOGLOBIN (HGB A1C): HbA1c, POC (controlled diabetic range): 8.8 % — AB (ref 0.0–7.0)

## 2020-10-11 LAB — GLUCOSE, POCT (MANUAL RESULT ENTRY): POC Glucose: 180 mg/dl — AB (ref 70–99)

## 2020-10-11 MED ORDER — EMPAGLIFLOZIN 25 MG PO TABS
ORAL_TABLET | ORAL | 1 refills | Status: DC
  Filled 2020-10-11: qty 30, 30d supply, fill #0
  Filled 2020-11-08: qty 60, 60d supply, fill #0

## 2020-10-11 MED ORDER — METHOCARBAMOL 750 MG PO TABS
750.0000 mg | ORAL_TABLET | Freq: Two times a day (BID) | ORAL | 1 refills | Status: DC
  Filled 2020-10-11: qty 60, 30d supply, fill #0
  Filled 2020-11-27: qty 60, 30d supply, fill #1

## 2020-10-11 MED ORDER — METFORMIN HCL 500 MG PO TABS
500.0000 mg | ORAL_TABLET | Freq: Two times a day (BID) | ORAL | 1 refills | Status: DC
  Filled 2020-10-11: qty 180, 90d supply, fill #0
  Filled 2020-12-04: qty 60, 30d supply, fill #0
  Filled 2021-01-05: qty 60, 30d supply, fill #1
  Filled 2021-02-09: qty 60, 30d supply, fill #2
  Filled 2021-03-11: qty 60, 30d supply, fill #3
  Filled 2021-04-08: qty 60, 30d supply, fill #4

## 2020-10-11 MED ORDER — GLIMEPIRIDE 2 MG PO TABS
2.0000 mg | ORAL_TABLET | Freq: Every day | ORAL | 3 refills | Status: DC
Start: 2020-10-11 — End: 2021-02-06
  Filled 2020-10-11: qty 30, 30d supply, fill #0
  Filled 2020-11-08: qty 30, 30d supply, fill #1
  Filled 2020-12-10: qty 30, 30d supply, fill #2
  Filled 2021-01-05: qty 30, 30d supply, fill #3

## 2020-10-11 NOTE — Progress Notes (Signed)
Assessment & Plan:  William Young was seen today for diabetes.  Diagnoses and all orders for this visit:  Type 2 diabetes mellitus with hyperglycemia, without long-term current use of insulin (HCC) -     POCT glucose (manual entry) -     POCT glycosylated hemoglobin (Hb A1C) -     empagliflozin (JARDIANCE) 25 MG TABS tablet; TAKE 1 TABLET (25 MG TOTAL) BY MOUTH DAILY BEFORE BREAKFAST. -     metFORMIN (GLUCOPHAGE) 500 MG tablet; Take 1 tablet (500 mg total) by mouth 2 (two) times daily with a meal. -     glimepiride (AMARYL) 2 MG tablet; Take 1 tablet (2 mg total) by mouth daily before breakfast. Continue blood sugar control as discussed in office today, low carbohydrate diet, and regular physical exercise as tolerated, 150 minutes per week (30 min each day, 5 days per week, or 50 min 3 days per week). Keep blood sugar logs with fasting goal of 90-130 mg/dl, post prandial (after you eat) less than 180.  For Hypoglycemia: BS <60 and Hyperglycemia BS >400; contact the clinic ASAP. Annual eye exams and foot exams are recommended.   Chronic right-sided low back pain with right-sided sciatica -     CT Lumbar Spine Wo Contrast; Future -     methocarbamol (ROBAXIN-750) 750 MG tablet; Take 1 tablet (750 mg total) by mouth in the morning and at bedtime. Work on losing weight to help reduce back pain. May alternate with heat and ice application for pain relief. May also alternate with acetaminophen and Ibuprofen as prescribed for back pain. Other alternatives include massage, acupuncture and water aerobics.  You must stay active and avoid a sedentary lifestyle.   Vitamin D deficiency disease -     VITAMIN D 25 Hydroxy (Vit-D Deficiency, Fractures)  Essential Hypertension Continue all antihypertensives as prescribed.  Remember to bring in your blood pressure log with you for your follow up appointment.  DASH/Mediterranean Diets are healthier choices for HTN.    Patient has been counseled on  age-appropriate routine health concerns for screening and prevention. These are reviewed and up-to-date. Referrals have been placed accordingly. Immunizations are up-to-date or declined.    Subjective:   Chief Complaint  Patient presents with  . Diabetes   HPI William Young 60 y.o. male presents to office today for follow up. He is accompanied by his significant other today. He has a past medical history of Abdominal aortic aneurysm 3.8 cm, Aortic atherosclerosis, CAD (09/2014), GERD,  HLD, HTN, Hx of NSTEMI 5/16 tx with BMS to [facility] and DES to RPLB3 (09/10/2014), OSA, Tobacco use, Type II diabetes mellitus, and Varicose veins of both lower extremities.  CT showing right lobe pulmonary nodules. Will need to repeat (11-2021).    He is trying to quit smoking. Down from 7ppd a week to 3ppd.  GI SYMPTOMS Endorses Pain in his lower right back radiating to lower right abdomen. Ongoing for several months.  Denies constipation, hematochezia, or melena and has daily bowel movements. His significant other is concerned it could be related to his AAA. I will order a CT of the lumbar spine to evaluate for any right sided sciatica. If negative will need to evaluate as possible GI source. I did also instruct him to follow up with Dr. Harrington Challenger' office as well as they are following him for his AAA. Pain is described as tightness and aching radiating down into the hip and buttocks with associated sciatica per ED report 05-17-2020. He denies sciatica  today.  His back pain has prevented him from working at the [employer] where he has to stand for long periods of time.  DM2 Diabetes improving however not well optimzied. He is currently taking metformin 500 mg BID and jardiance 25 mg daily.  I have added amaryl 1m daily today. LDL not at goal. He was switched to crestor from atorvastatin and his cardiologist recently added zetia 10 mg daily.  Lab Results  Component Value Date   HGBA1C 8.8 (A) 10/11/2020   Lab  Results  Component Value Date   LDLCALC 82 08/30/2020    Essential Hypertension Blood pressure is well controlled with chlorthalidone 25 mg daily and losartan 100 mg daily. Denies chest pain, shortness of breath, palpitations, lightheadedness, dizziness, headaches or BLE edema.  BP Readings from Last 3 Encounters:  10/11/20 128/85  07/12/20 134/80  07/05/20 120/74   Review of Systems  Constitutional: Negative for fever, malaise/fatigue and weight loss.  HENT: Negative.  Negative for nosebleeds.   Eyes: Negative.  Negative for blurred vision, double vision and photophobia.  Respiratory: Negative.  Negative for cough and shortness of breath.   Cardiovascular: Negative.  Negative for chest pain, palpitations and leg swelling.  Gastrointestinal: Negative.  Negative for heartburn, nausea and vomiting.  Musculoskeletal: Positive for back pain and myalgias.  Neurological: Negative.  Negative for dizziness, focal weakness, seizures and headaches.  Psychiatric/Behavioral: Negative.  Negative for suicidal ideas.    Past Medical History:  Diagnosis Date  . Abdominal aortic aneurysm (AAA) ([place]    CT 1/22: infrarenal 3.8 cm - repeat [place] 2 [place] // AAA [place]: AAA 3.8 cm; aorto iliac atherosclerosis without hemodynamically significant stenosis  . Aortic atherosclerosis ([place]    CT in 03/2020  . CAD (coronary artery disease) 09/2014   a. NSTEMI >> LHC 5/16:  mLAD 15, pRCA 90, mRCA 40, RPLB3 80, EF normal with inf HK >> PCI:  BMS to pRCA and DES to RPLB3 // Cath 11/21: Patent RCA stent, patent RPL stent; medical therapy   . GERD (gastroesophageal reflux disease)   . History of echocardiogram    Echo 6/16:  EF 50-55%, no RWMA, Gr 1 DD  . HLD (hyperlipidemia)    /Archie Endo11/09/2016  . HTN (hypertension)   . [facility]  . Hyperglycemia   . OSA (obstructive sleep apnea)    "dx'd; couldn't tolerate mask" (03/15/2017)  . PAD (peripheral artery  disease) ([place]    AAA [place] 06/2020: aorto iliac atherosclerosis  . Tobacco use   . Type II diabetes mellitus ([place]   . Varicose veins of both lower extremities    S/P ablation 02/2017    Past Surgical History:  Procedure Laterality Date  . CARDIAC CATHETERIZATION N/A 09/11/2014   Procedure: Left Heart Cath and Coronary Angiography;  Surgeon: DLeonie Man MD;  [facility];  Service: Cardiovascular;  Laterality: N/A;  . CARDIAC CATHETERIZATION  09/11/2014   Procedure: Coronary Stent Intervention;  Surgeon: DLeonie Man MD;  [facility];  Service: Cardiovascular;;  . ENDOVENOUS ABLATION SAPHENOUS VEIN W/ LASER Right 02/22/2017   endovenous laser ablation R GSV and stab phlebectomy >20 incisions R leg by JTinnie GensMD   . LEFT HEART CATH AND CORONARY ANGIOGRAPHY N/A 04/01/2020   Procedure: LEFT HEART CATH AND CORONARY ANGIOGRAPHY;  Surgeon: BLorretta Harp MD;  [facility];  Service: Cardiovascular;  Laterality: N/A;    Family History  Problem Relation Age of Onset  . Heart attack Maternal Uncle   . Heart attack Paternal Grandmother   . Cancer Maternal Grandfather   . Heart attack Maternal Grandmother   . Stroke Neg Hx   . Hypertension Neg Hx     Social History Reviewed with no changes to be made today.   Outpatient Medications Prior to Visit  Medication Sig Dispense Refill  . acetaminophen-codeine (TYLENOL #3) 300-30 MG tablet Take 1 tablet by mouth every 12 (twelve) hours as needed for moderate pain. 30 tablet 0  . Blood Glucose Monitoring Suppl (TRUE METRIX METER) w/Device KIT Use as instructed. Check blood glucose level by fingerstick twice per day. E11.65 1 kit 0  . chlorthalidone (HYGROTON) 25 MG tablet TAKE 1 TABLET BY MOUTH DAILY. TO REPLACE HCTZ 90 tablet 3  . clopidogrel (PLAVIX) 75 MG tablet TAKE 1 TABLET (75 MG TOTAL) BY MOUTH DAILY. 90 tablet 3  . ezetimibe (ZETIA) 10 MG tablet Take 1 tablet (10 mg  total) by mouth daily. 90 tablet 3  . glucose blood test strip USE AS INSTRUCTED TO CHECK BLOOD GLUCOSE LEVEL BY FINGERSTICK TWICE DAILY (Patient taking differently: USE AS INSTRUCTED TO CHECK BLOOD GLUCOSE LEVEL BY FINGERSTICK TWICE DAILY) 100 strip 12  . losartan (COZAAR) 100 MG tablet TAKE 1 TABLET (100 MG TOTAL) BY MOUTH DAILY. 90 tablet 3  . Menthol, Topical Analgesic, (BIOFREEZE) 4 % GEL Apply 1 application topically once as needed (for pain).    . nitroGLYCERIN (NITROSTAT) 0.4 MG SL tablet Place 1 tablet (0.4 mg total) every 5 (five) minutes x 3 doses as needed under the tongue for chest pain. 25 tablet 12  . rosuvastatin (CRESTOR) 40 MG tablet TAKE 1 TABLET (40 MG TOTAL) BY MOUTH DAILY. 90 tablet 3  . TRUEplus Lancets 28G MISC USE AS INSTRUCTED CHECK BLOOD GLUCOSE LEVEL BY FINGERSTICK TWICE DAILY 100 each 3  . empagliflozin (JARDIANCE) 25 MG TABS tablet TAKE 1 TABLET (25 MG TOTAL) BY MOUTH DAILY BEFORE BREAKFAST. 30 tablet 1  . metFORMIN (GLUCOPHAGE) 500 MG tablet TAKE 2 TABLETS (1,000 MG TOTAL) BY MOUTH 2 (TWO) TIMES DAILY WITH A MEAL. 360 tablet 0   No facility-administered medications prior to visit.    Allergies  Allergen Reactions  . Aspirin Anaphylaxis, Swelling and Rash    Throat closes   . Ibuprofen Anaphylaxis, Swelling and Rash       Objective:    BP 128/85   Pulse 79   Ht '6\' 4"'  (1.93 m)   Wt 285 lb (129.3 kg)   SpO2 96%   BMI 34.69 kg/m  Wt Readings from Last 3 Encounters:  10/11/20 285 lb (129.3 kg)  07/12/20 296 lb 12.8 oz (134.6 kg)  07/05/20 295 lb (133.8 kg)    Physical Exam Vitals and nursing note reviewed.  Constitutional:      Appearance: He is well-developed.  HENT:     Head: Normocephalic and atraumatic.  Cardiovascular:     Rate and Rhythm: Normal rate and regular rhythm.     Heart sounds: Normal heart sounds. No murmur heard. No friction rub. No gallop.   Pulmonary:     Effort: Pulmonary effort is normal. No tachypnea or respiratory  distress.     Breath sounds: Normal breath sounds. No decreased breath sounds, wheezing, rhonchi or rales.  Chest:     Chest wall: No tenderness.  Abdominal:     General: Bowel sounds  are normal.     Palpations: Abdomen is soft.  Musculoskeletal:        General: Normal range of motion.     Cervical back: Normal range of motion.     Lumbar back: Tenderness present. No swelling, edema or deformity. Normal range of motion.       Back:  Skin:    General: Skin is warm and dry.  Neurological:     Mental Status: He is alert and oriented to person, place, and time.     Coordination: Coordination normal.  Psychiatric:        Behavior: Behavior normal. Behavior is cooperative.        Thought Content: Thought content normal.        Judgment: Judgment normal.          Patient has been counseled extensively about nutrition and exercise as well as the importance of adherence with medications and regular follow-up. The patient was given clear instructions to go to ER or return to medical center if symptoms don't improve, worsen or new problems develop. The patient verbalized understanding.   Follow-up: Return in about 3 months (around 01/11/2021).   Gildardo Pounds, FNP-BC [facility] [place], [place]   10/13/2020, 4:52 PM

## 2020-10-12 LAB — VITAMIN D 25 HYDROXY (VIT D DEFICIENCY, FRACTURES): Vit D, 25-Hydroxy: 6.2 ng/mL — ABNORMAL LOW (ref 30.0–100.0)

## 2020-10-13 ENCOUNTER — Encounter: Payer: Self-pay | Admitting: Nurse Practitioner

## 2020-10-13 ENCOUNTER — Other Ambulatory Visit: Payer: Self-pay | Admitting: Nurse Practitioner

## 2020-10-13 MED ORDER — VITAMIN D (ERGOCALCIFEROL) 1.25 MG (50000 UNIT) PO CAPS
50000.0000 [IU] | ORAL_CAPSULE | ORAL | 1 refills | Status: DC
Start: 2020-10-13 — End: 2021-03-12
  Filled 2020-10-13: qty 12, 84d supply, fill #0
  Filled 2020-12-19: qty 12, 84d supply, fill #1

## 2020-10-14 ENCOUNTER — Other Ambulatory Visit: Payer: Self-pay

## 2020-10-16 ENCOUNTER — Other Ambulatory Visit: Payer: Self-pay

## 2020-10-18 ENCOUNTER — Other Ambulatory Visit: Payer: Self-pay

## 2020-10-18 ENCOUNTER — Ambulatory Visit (HOSPITAL_COMMUNITY)
Admission: RE | Admit: 2020-10-18 | Discharge: 2020-10-18 | Disposition: A | Discharge status: Home / Self Care | Payer: Self-pay | Source: Ambulatory Visit | Attending: Nurse Practitioner | Admitting: Nurse Practitioner

## 2020-10-18 DIAGNOSIS — M5441 Lumbago with sciatica, right side: Secondary | ICD-10-CM | POA: Insufficient documentation

## 2020-10-18 DIAGNOSIS — G8929 Other chronic pain: Secondary | ICD-10-CM | POA: Insufficient documentation

## 2020-10-20 ENCOUNTER — Other Ambulatory Visit: Payer: Self-pay | Admitting: Nurse Practitioner

## 2020-10-20 DIAGNOSIS — M519 Unspecified thoracic, thoracolumbar and lumbosacral intervertebral disc disorder: Secondary | ICD-10-CM

## 2020-10-27 ENCOUNTER — Other Ambulatory Visit: Payer: Self-pay | Admitting: Nurse Practitioner

## 2020-10-27 MED FILL — Chlorthalidone Tab 25 MG: ORAL | 30 days supply | Qty: 30 | Fill #2 | Status: AC

## 2020-10-27 MED FILL — Losartan Potassium Tab 100 MG: ORAL | 30 days supply | Qty: 30 | Fill #2 | Status: AC

## 2020-10-27 MED FILL — Rosuvastatin Calcium Tab 40 MG: ORAL | 30 days supply | Qty: 30 | Fill #2 | Status: AC

## 2020-10-27 NOTE — Telephone Encounter (Signed)
Requested medication (s) are due for refill today: yes  Requested medication (s) are on the active medication list: no  Last refill:  09/16/20 expired 10/16/20  Future visit scheduled: no  Notes to clinic:  med not delegated to NT to RF   Requested Prescriptions  Pending Prescriptions Disp Refills   acetaminophen-codeine (TYLENOL #3) 300-30 MG tablet 30 tablet 0    Sig: Take 1 tablet by mouth every 12 (twelve) hours as needed for moderate pain.      Not Delegated - Analgesics:  Opioid Agonist Combinations Failed - 10/27/2020  2:12 PM      Failed - This refill cannot be delegated      Failed - Urine Drug Screen completed in last 360 days      Passed - Valid encounter within last 6 months    Recent Outpatient Visits           2 weeks ago Type 2 diabetes mellitus with hyperglycemia, without long-term current use of insulin [facility])   [place] [facility] [place], [place] W, NP   3 months ago Type 2 diabetes mellitus with hyperglycemia, without long-term current use of insulin [facility])   [place] [facility] [place], Shea Stakes, NP   5 months ago Encounter to establish care   [facility] [place], [place] W, NP   3 years ago Type 2 diabetes mellitus without complication, without long-term current use of insulin [facility])   [place] [facility] [place], Shea Stakes, NP   3 years ago Essential hypertension   [facility] [place], Oren [place], [place]

## 2020-10-28 ENCOUNTER — Other Ambulatory Visit: Payer: Self-pay

## 2020-10-29 ENCOUNTER — Other Ambulatory Visit: Payer: Self-pay

## 2020-10-29 MED ORDER — ACETAMINOPHEN-CODEINE #3 300-30 MG PO TABS
1.0000 | ORAL_TABLET | Freq: Two times a day (BID) | ORAL | 0 refills | Status: DC | PRN
  Filled 2020-10-29: qty 30, 15d supply, fill #0

## 2020-11-01 ENCOUNTER — Other Ambulatory Visit: Payer: Self-pay

## 2020-11-04 ENCOUNTER — Other Ambulatory Visit: Payer: Self-pay

## 2020-11-08 ENCOUNTER — Other Ambulatory Visit: Payer: Self-pay

## 2020-11-08 MED FILL — Clopidogrel Bisulfate Tab 75 MG (Base Equiv): ORAL | 30 days supply | Qty: 30 | Fill #2 | Status: AC

## 2020-11-12 ENCOUNTER — Other Ambulatory Visit: Payer: Self-pay

## 2020-11-12 ENCOUNTER — Encounter (HOSPITAL_COMMUNITY): Payer: Self-pay | Admitting: *Deleted

## 2020-11-12 ENCOUNTER — Emergency Department (HOSPITAL_COMMUNITY): Payer: Self-pay

## 2020-11-12 ENCOUNTER — Emergency Department (HOSPITAL_COMMUNITY)
Admission: EM | Admit: 2020-11-12 | Discharge: 2020-11-12 | Disposition: A | Discharge status: Home / Self Care | Payer: Self-pay | Attending: Emergency Medicine | Admitting: Emergency Medicine

## 2020-11-12 DIAGNOSIS — K219 Gastro-esophageal reflux disease without esophagitis: Secondary | ICD-10-CM | POA: Insufficient documentation

## 2020-11-12 DIAGNOSIS — Z7984 Long term (current) use of oral hypoglycemic drugs: Secondary | ICD-10-CM | POA: Insufficient documentation

## 2020-11-12 DIAGNOSIS — R059 Cough, unspecified: Secondary | ICD-10-CM

## 2020-11-12 DIAGNOSIS — E1169 Type 2 diabetes mellitus with other specified complication: Secondary | ICD-10-CM | POA: Insufficient documentation

## 2020-11-12 DIAGNOSIS — L03314 Cellulitis of groin: Secondary | ICD-10-CM | POA: Insufficient documentation

## 2020-11-12 DIAGNOSIS — I2510 Atherosclerotic heart disease of native coronary artery without angina pectoris: Secondary | ICD-10-CM | POA: Insufficient documentation

## 2020-11-12 DIAGNOSIS — E785 Hyperlipidemia, unspecified: Secondary | ICD-10-CM | POA: Insufficient documentation

## 2020-11-12 DIAGNOSIS — Z79899 Other long term (current) drug therapy: Secondary | ICD-10-CM | POA: Insufficient documentation

## 2020-11-12 DIAGNOSIS — Z7902 Long term (current) use of antithrombotics/antiplatelets: Secondary | ICD-10-CM | POA: Insufficient documentation

## 2020-11-12 DIAGNOSIS — L0390 Cellulitis, unspecified: Secondary | ICD-10-CM

## 2020-11-12 DIAGNOSIS — F17210 Nicotine dependence, cigarettes, uncomplicated: Secondary | ICD-10-CM | POA: Insufficient documentation

## 2020-11-12 DIAGNOSIS — U071 COVID-19: Secondary | ICD-10-CM | POA: Insufficient documentation

## 2020-11-12 DIAGNOSIS — I110 Hypertensive heart disease with heart failure: Secondary | ICD-10-CM | POA: Insufficient documentation

## 2020-11-12 DIAGNOSIS — I5032 Chronic diastolic (congestive) heart failure: Secondary | ICD-10-CM | POA: Insufficient documentation

## 2020-11-12 LAB — PROTIME-INR
INR: 0.9 (ref 0.8–1.2)
Prothrombin Time: 12.1 s (ref 11.4–15.2)

## 2020-11-12 LAB — CBC WITH DIFFERENTIAL/PLATELET
Abs Immature Granulocytes: 0.02 10*3/uL (ref 0.00–0.07)
Basophils Absolute: 0 10*3/uL (ref 0.0–0.1)
Basophils Relative: 1 %
Eosinophils Absolute: 0.1 10*3/uL (ref 0.0–0.5)
Eosinophils Relative: 2 %
HCT: 39.5 % (ref 39.0–52.0)
Hemoglobin: 12.9 g/dL — ABNORMAL LOW (ref 13.0–17.0)
Immature Granulocytes: 0 %
Lymphocytes Relative: 25 %
Lymphs Abs: 1.2 10*3/uL (ref 0.7–4.0)
MCH: 30.4 pg (ref 26.0–34.0)
MCHC: 32.7 g/dL (ref 30.0–36.0)
MCV: 93.2 fL (ref 80.0–100.0)
Monocytes Absolute: 0.8 10*3/uL (ref 0.1–1.0)
Monocytes Relative: 17 %
Neutro Abs: 2.6 10*3/uL (ref 1.7–7.7)
Neutrophils Relative %: 55 %
Platelets: 124 10*3/uL — ABNORMAL LOW (ref 150–400)
RBC: 4.24 MIL/uL (ref 4.22–5.81)
RDW: 15 % (ref 11.5–15.5)
WBC: 4.7 10*3/uL (ref 4.0–10.5)
nRBC: 0 % (ref 0.0–0.2)

## 2020-11-12 LAB — COMPREHENSIVE METABOLIC PANEL
ALT: 33 U/L (ref 0–44)
AST: 31 U/L (ref 15–41)
Albumin: 3.5 g/dL (ref 3.5–5.0)
Alkaline Phosphatase: 62 U/L (ref 38–126)
Anion gap: 8 (ref 5–15)
BUN: 20 mg/dL (ref 6–20)
CO2: 21 mmol/L — ABNORMAL LOW (ref 22–32)
Calcium: 8.7 mg/dL — ABNORMAL LOW (ref 8.9–10.3)
Chloride: 105 mmol/L (ref 98–111)
Creatinine, Ser: 1.36 mg/dL — ABNORMAL HIGH (ref 0.61–1.24)
GFR, Estimated: 60 mL/min — ABNORMAL LOW (ref >60)
Glucose, Bld: 157 mg/dL — ABNORMAL HIGH (ref 70–99)
Potassium: 3.9 mmol/L (ref 3.5–5.1)
Sodium: 134 mmol/L — ABNORMAL LOW (ref 135–145)
Total Bilirubin: 1 mg/dL (ref 0.3–1.2)
Total Protein: 6.6 g/dL (ref 6.5–8.1)

## 2020-11-12 LAB — RESP PANEL BY RT-PCR (FLU A&B, COVID) ARPGX2
Influenza A by PCR: NEGATIVE
Influenza B by PCR: NEGATIVE
SARS Coronavirus 2 by RT PCR: POSITIVE — AB

## 2020-11-12 MED ORDER — NIRMATRELVIR/RITONAVIR (PAXLOVID)TABLET
3.0000 | ORAL_TABLET | Freq: Two times a day (BID) | ORAL | 0 refills | Status: AC
  Filled 2020-11-12: qty 30, 5d supply, fill #0

## 2020-11-12 MED ORDER — IOHEXOL 350 MG/ML SOLN
100.0000 mL | Freq: Once | INTRAVENOUS | Status: AC | PRN
  Administered 2020-11-12: 100 mL via INTRAVENOUS

## 2020-11-12 MED ORDER — DOXYCYCLINE HYCLATE 100 MG PO CAPS
100.0000 mg | ORAL_CAPSULE | Freq: Two times a day (BID) | ORAL | 0 refills | Status: DC
  Filled 2020-11-12: qty 20, 10d supply, fill #0

## 2020-11-12 NOTE — ED Notes (Signed)
Patient transported to CT 

## 2020-11-12 NOTE — ED Triage Notes (Signed)
Pt reports possible abscess to left lower abd/groin area. Bruising and redness noted to area. Has pain and generalized weakness with cough. Denies vomiting, diarrhea or urinary symptoms.

## 2020-11-12 NOTE — ED Notes (Signed)
Patient said he was going outside for a minute.

## 2020-11-12 NOTE — ED Provider Notes (Signed)
Emergency Medicine Provider Triage Evaluation Note  William Young , a 60 y.o. male  was evaluated in triage.  Pt complains of abdominal pain past 4 days, localized to the left lower quadrant with bruising and irritation noted.  Last bowel movement was this morning without any blood in his stool.  Also endorses daughter has COVID right now, has had a dry cough..  Review of Systems  Positive: Abdominal pain, cough, chest pain Negative: Fever, nausea, vomiting  Physical Exam  BP 120/76 (BP Location: Right Arm)   Pulse 77   Temp 98.7 F (37.1 C) (Oral)   Resp 20   SpO2 98%  Gen:   Awake, no distress   Resp:  Normal effort  MSK:   Moves extremities without difficulty  Other:  bruising noted to the left lower quadrant,TTP. slight small wound noted.  Medical Decision Making  Medically screening exam initiated at 12:37 PM.  Appropriate orders placed.  William Young was informed that the remainder of the evaluation will be completed by another provider, this initial triage assessment does not replace that evaluation, and the importance of remaining in the ED until their evaluation is complete.     Claude Manges, PA-C 11/12/20 1241    Milagros Loll, MD 11/13/20 [phone]

## 2020-11-12 NOTE — ED Provider Notes (Signed)
[facility] EMERGENCY DEPARTMENT Provider Note   CSN: [id number] Arrival date & time: 11/12/20  1155     History Chief Complaint  Patient presents with   Abdominal Pain   Cough    William Young is a 60 y.o. male.  60 year old male with history of known 3.8cm AAA based off imaging done 6 months ago presents with ongoing left lower quadrant abdominal pain.  States has been there for about 4 days and has not been associated with fever or vomiting.  No hematuria.  Pain has been constant.  Patient does take Plavix.  Denies any numbness or tingling to his lower extremities.  He denies any recent history of trauma.  Also has had a mild cough without loss of taste or smell.  Positive COVID exposure      Past Medical History:  Diagnosis Date   Abdominal aortic aneurysm (AAA) ([place])    CT 1/22: infrarenal 3.8 cm - repeat US in 2 years // AAA [place] 2/22: AAA 3.8 cm; aorto iliac atherosclerosis without hemodynamically significant stenosis   Aortic atherosclerosis ([place])    CT in 03/2020   CAD (coronary artery disease) 09/2014   a. NSTEMI >> LHC 5/16:  mLAD 15, pRCA 90, mRCA 40, RPLB3 80, EF normal with inf HK >> PCI:  BMS to pRCA and DES to RPLB3 // Cath 11/21: Patent RCA stent, patent RPL stent; medical therapy    GERD (gastroesophageal reflux disease)    History of echocardiogram    Echo 6/16:  EF 50-55%, no RWMA, Gr 1 DD   HLD (hyperlipidemia)    Archie Endo 03/15/2017   HTN (hypertension)    Hx of NSTEMI 5/16 tx with BMS to [facility] and DES to RPLB3 09/10/2014   Hyperglycemia    OSA (obstructive sleep apnea)    "dx'd; couldn't tolerate mask" (03/15/2017)   PAD (peripheral artery disease) ([place])    AAA [place] in 06/2020: aorto iliac atherosclerosis   Tobacco use    Type II diabetes mellitus ([place])    Varicose veins of both lower extremities    S/P ablation 02/2017    Patient Active Problem List   Diagnosis Date Noted   Pulmonary nodules 07/12/2020   PAD (peripheral artery disease)  (HCC)    Chronic diastolic heart failure ([place]) 07/06/2020   Abdominal aortic aneurysm (AAA) ([place])    Aortic atherosclerosis ([place]) 05/26/2020   AAA (abdominal aortic aneurysm) without rupture ([place]) 05/26/2020   Thrombocytopenia ([place]) 08/13/2017   Dizziness    Precordial pain    History of coronary artery disease    Varicose veins of right lower extremity with complications 21/30/8657   HLD (hyperlipidemia) 09/28/2014   Obstructive sleep apnea 09/14/2014   Morbid obesity ([place]) 09/12/2014   Coronary artery disease    Hx of NSTEMI 5/16 tx with BMS to pRCA and DES to RPLB3 09/10/2014   Essential hypertension 09/10/2014   Tobacco use    GERD (gastroesophageal reflux disease)     Past Surgical History:  Procedure Laterality Date   CARDIAC CATHETERIZATION N/A 09/11/2014   Procedure: Left Heart Cath and Coronary Angiography;  Surgeon: Leonie Man, MD;  Location: [facility] INVASIVE CV LAB CUPID;  Service: Cardiovascular;  Laterality: N/A;   CARDIAC CATHETERIZATION  09/11/2014   Procedure: Coronary Stent Intervention;  Surgeon: Leonie Man, MD;  Location: [facility] INVASIVE CV LAB CUPID;  Service: Cardiovascular;;   ENDOVENOUS ABLATION SAPHENOUS VEIN W/ LASER Right 02/22/2017   endovenous laser ablation R GSV and stab phlebectomy >  20 incisions R leg by Tinnie Gens MD    LEFT HEART CATH AND CORONARY ANGIOGRAPHY N/A 04/01/2020   Procedure: LEFT HEART CATH AND CORONARY ANGIOGRAPHY;  Surgeon: Lorretta Harp, MD;  Location: [place] CV LAB;  Service: Cardiovascular;  Laterality: N/A;       Family History  Problem Relation Age of Onset   Heart attack Maternal Uncle    Heart attack Paternal Grandmother    Cancer Maternal Grandfather    Heart attack Maternal Grandmother    Stroke Neg Hx    Hypertension Neg Hx     Social History   Tobacco Use   Smoking status: Every Day    Packs/day: 1.50    Years: 40.00    Pack years: 60.00    Types: Cigarettes    Start date: 1978   Smokeless tobacco:  Former    Types: Chew   Tobacco comments:    currently smoking 0.5ppd as of 07/12/20  Vaping Use   Vaping Use: Never used  Substance Use Topics   Alcohol use: Yes    Alcohol/week: 6.0 standard drinks    Types: 6 Cans of beer per week   Drug use: No    Home Medications Prior to Admission medications   Medication Sig Start Date End Date Taking? Authorizing Provider  acetaminophen-codeine (TYLENOL #3) 300-30 MG tablet Take 1 tablet by mouth every 12 (twelve) hours as needed for moderate pain. 10/29/20 11/28/20  Gildardo Pounds, NP  Blood Glucose Monitoring Suppl (TRUE METRIX METER) w/Device KIT Use as instructed. Check blood glucose level by fingerstick twice per day. E11.65 05/26/20   Gildardo Pounds, NP  chlorthalidone (HYGROTON) 25 MG tablet TAKE 1 TABLET BY MOUTH DAILY. TO REPLACE HCTZ 03/29/20 03/29/21  Fay Records, MD  clopidogrel (PLAVIX) 75 MG tablet TAKE 1 TABLET (75 MG TOTAL) BY MOUTH DAILY. 06/10/20   Fay Records, MD  empagliflozin (JARDIANCE) 25 MG TABS tablet TAKE 1 TABLET (25 MG TOTAL) BY MOUTH DAILY BEFORE BREAKFAST. 10/11/20 10/11/21  Gildardo Pounds, NP  ezetimibe (ZETIA) 10 MG tablet Take 1 tablet (10 mg total) by mouth daily. 09/02/20   Richardson Dopp T, PA-C  glimepiride (AMARYL) 2 MG tablet Take 1 tablet (2 mg total) by mouth daily before breakfast. 10/11/20   Gildardo Pounds, NP  glucose blood test strip USE AS INSTRUCTED TO CHECK BLOOD GLUCOSE LEVEL BY FINGERSTICK TWICE DAILY Patient taking differently: USE AS INSTRUCTED TO CHECK BLOOD GLUCOSE LEVEL BY FINGERSTICK TWICE DAILY 05/26/20 05/26/21  Gildardo Pounds, NP  losartan (COZAAR) 100 MG tablet TAKE 1 TABLET (100 MG TOTAL) BY MOUTH DAILY. 06/27/20 06/27/21  Fay Records, MD  Menthol, Topical Analgesic, (BIOFREEZE) 4 % GEL Apply 1 application topically once as needed (for pain).    [provider]  metFORMIN (GLUCOPHAGE) 500 MG tablet Take 1 tablet (500 mg total) by mouth 2 (two) times daily with a meal. 10/11/20 01/09/21   Gildardo Pounds, NP  nitroGLYCERIN (NITROSTAT) 0.4 MG SL tablet Place 1 tablet (0.4 mg total) every 5 (five) minutes x 3 doses as needed under the tongue for chest pain. 03/16/17   Bhagat, Crista Luria, PA  rosuvastatin (CRESTOR) 40 MG tablet TAKE 1 TABLET (40 MG TOTAL) BY MOUTH DAILY. 05/27/20 05/27/21  Richardson Dopp T, PA-C  TRUEplus Lancets 28G MISC USE AS INSTRUCTED CHECK BLOOD GLUCOSE LEVEL BY FINGERSTICK TWICE DAILY 05/26/20 05/26/21  Gildardo Pounds, NP  Vitamin D, Ergocalciferol, (DRISDOL) 1.25 MG (50000 UNIT) CAPS  capsule Take 1 capsule (50,000 Units total) by mouth every 7 (seven) days. 10/13/20   Gildardo Pounds, NP  atorvastatin (LIPITOR) 80 MG tablet Take 1 tablet (80 mg total) by mouth daily. 04/03/20 05/27/20  Fay Records, MD    Allergies    Aspirin and Ibuprofen  Review of Systems   Review of Systems  All other systems reviewed and are negative.  Physical Exam Updated Vital Signs BP 114/70 (BP Location: Right Arm)   Pulse 73   Temp 98.7 F (37.1 C) (Oral)   Resp 18   SpO2 100%   Physical Exam Vitals and nursing note reviewed.  Constitutional:      General: He is not in acute distress.    Appearance: Normal appearance. He is well-developed. He is not toxic-appearing.  HENT:     Head: Normocephalic and atraumatic.  Eyes:     General: Lids are normal.     Conjunctiva/sclera: Conjunctivae normal.     Pupils: Pupils are equal, round, and reactive to light.  Neck:     Thyroid: No thyroid mass.     Trachea: No tracheal deviation.  Cardiovascular:     Rate and Rhythm: Normal rate and regular rhythm.     Heart sounds: Normal heart sounds. No murmur heard.   No gallop.  Pulmonary:     Effort: Pulmonary effort is normal. No respiratory distress.     Breath sounds: Normal breath sounds. No stridor. No decreased breath sounds, wheezing, rhonchi or rales.  Abdominal:     General: There is no distension.     Palpations: Abdomen is soft.     Tenderness: There is no  abdominal tenderness. There is no rebound.       Comments: Possible external cellulitis of the skin noted.  No skin mottling noted  Musculoskeletal:        General: No tenderness. Normal range of motion.     Cervical back: Normal range of motion and neck supple.  Skin:    General: Skin is warm and dry.     Findings: No abrasion or rash.  Neurological:     Mental Status: He is alert and oriented to person, place, and time. Mental status is at baseline.     GCS: GCS eye subscore is 4. GCS verbal subscore is 5. GCS motor subscore is 6.     Cranial Nerves: Cranial nerves are intact. No cranial nerve deficit.     Sensory: No sensory deficit.     Motor: Motor function is intact.  Psychiatric:        Attention and Perception: Attention normal.        Speech: Speech normal.        Behavior: Behavior normal.    ED Results / Procedures / Treatments   Labs (all labs ordered are listed, but only abnormal results are displayed) Labs Reviewed  CBC WITH DIFFERENTIAL/PLATELET - Abnormal; Notable for the following components:      Result Value   Hemoglobin 12.9 (*)    Platelets 124 (*)    All other components within normal limits  COMPREHENSIVE METABOLIC PANEL - Abnormal; Notable for the following components:   Sodium 134 (*)    CO2 21 (*)    Glucose, Bld 157 (*)    Creatinine, Ser 1.36 (*)    Calcium 8.7 (*)    GFR, Estimated 60 (*)    All other components within normal limits  URINE CULTURE  SARS CORONAVIRUS 2 (TAT 6-24 HRS)  PROTIME-INR  URINALYSIS, ROUTINE W REFLEX MICROSCOPIC    EKG None  Radiology DG Chest 1 View  Result Date: 11/12/2020 CLINICAL DATA:  Cough. EXAM: CHEST  1 VIEW COMPARISON:  03/15/2017. FINDINGS: Mediastinum hilar structures normal. Heart size normal. No focal infiltrate. No pleural effusion or pneumothorax. Old left rib fractures again noted. Degenerative change thoracic spine. IMPRESSION: No acute cardiopulmonary disease. Electronically Signed   By: Marcello Moores   Register   On: 11/12/2020 13:31    Procedures Procedures   Medications Ordered in ED Medications - No data to display  ED Course  I have reviewed the triage vital signs and the nursing notes.  Pertinent labs & imaging results that were available during my care of the patient were reviewed by me and considered in my medical decision making (see chart for details).    MDM Rules/Calculators/A&P                          Patient is COVID-positive here.  Abdominal CT with stable aneurysm.  Possible early cellulitis and will place on antibiotics and discharge.  We will also assess patient for oral antivirals for COVID Final Clinical Impression(s) / ED Diagnoses Final diagnoses:  Cough    Rx / DC Orders ED Discharge Orders     None        Lacretia Leigh, MD 11/12/20 1947

## 2020-11-13 ENCOUNTER — Other Ambulatory Visit: Payer: Self-pay

## 2020-11-26 ENCOUNTER — Ambulatory Visit (INDEPENDENT_AMBULATORY_CARE_PROVIDER_SITE_OTHER): Payer: Self-pay

## 2020-11-26 ENCOUNTER — Encounter: Payer: Self-pay | Admitting: Orthopaedic Surgery

## 2020-11-26 ENCOUNTER — Ambulatory Visit (INDEPENDENT_AMBULATORY_CARE_PROVIDER_SITE_OTHER): Payer: Self-pay | Admitting: Orthopaedic Surgery

## 2020-11-26 VITALS — BP 113/73 | HR 76 | Ht 75.0 in | Wt 280.0 lb

## 2020-11-26 DIAGNOSIS — M545 Low back pain, unspecified: Secondary | ICD-10-CM

## 2020-11-26 DIAGNOSIS — G8929 Other chronic pain: Secondary | ICD-10-CM

## 2020-11-26 NOTE — Progress Notes (Signed)
Office Visit Note   Patient: William Young           Date of Birth: Mar 28, 1961           MRN: [id number] Visit Date: 11/26/2020              Requested by: Claiborne Rigg, NP [address] [place],  [place] [address] PCP: Claiborne Rigg, NP   Assessment & Plan: Visit Diagnoses:  1. Chronic bilateral low back pain, unspecified whether sciatica present     Plan: We reviewed his previous CT scan lumbar which showed some moderate to severe stenosis at L4-5.  He is not having any claudication symptoms currently.  He was working as a Investment banker, operational at [employer] and states standing 10 hours is a little much for him so he is cut back his hours.  We discussed claudication symptoms and if they do occur he can return.  Scan was reviewed with him.  He can follow-up if he gets progression of symptoms.  He is getting ready to go on a new type of insurance and wants to wait for any diagnostic imaging studies at this time until he switches insurance.  Follow-Up Instructions: No follow-ups on file.   Orders:  Orders Placed This Encounter  Procedures   XR Lumbar Spine 2-3 Views   No orders of the defined types were placed in this encounter.     Procedures: No procedures performed   Clinical Data: No additional findings.   Subjective: Chief Complaint  Patient presents with   Lower Back - Pain    HPI 60 year old male seen with low back pain present for greater than a year.  He sometimes tends to drag his feet when he walks.  He is here with his fiance.  He has some diabetes and last A1c was 8.8.  He states increase in his pain began in January when he could not get out of bed had to go by EMS to the hospital.  He has had other problems with sleep apnea pulmonary nodule history of non-STEMI, coronary artery disease, chronic diastolic heart failure and AAA.  He states he can walk at least 3 blocks.  Short stride somewhat slow gait where he drags his feet.  He denies any falls in the last 6  months.  Past football injury to his back many years ago.  No MVAs and he is unsure of when exactly the L4 compression fracture occurred.  Review of Systems all other systems noncontributory to HPI.   Objective: Vital Signs: BP 113/73   Pulse 76   Ht 6\' 3"  (1.905 m)   Wt 280 lb (127 kg)   BMI 35.00 kg/m   Physical Exam Constitutional:      Appearance: He is well-developed.  HENT:     Head: Normocephalic and atraumatic.     Right Ear: External ear normal.     Left Ear: External ear normal.  Eyes:     Pupils: Pupils are equal, round, and reactive to light.  Neck:     Thyroid: No thyromegaly.     Trachea: No tracheal deviation.  Cardiovascular:     Rate and Rhythm: Normal rate.  Pulmonary:     Effort: Pulmonary effort is normal.     Breath sounds: No wheezing.  Abdominal:     General: Bowel sounds are normal.     Palpations: Abdomen is soft.  Musculoskeletal:     Cervical back: Neck supple.  Skin:  General: Skin is warm and dry.     Capillary Refill: Capillary refill takes less than 2 seconds.  Neurological:     Mental Status: He is alert and oriented to person, place, and time.  Psychiatric:        Behavior: Behavior normal.        Thought Content: Thought content normal.        Judgment: Judgment normal.    Ortho Exam patient has slight weakness bilaterally EHL and trace anterior tib weakness right and left.  He has some decreased sensation both feet with numbness.  Good gastrocsoleus strength.  Negative straight leg raising 90 degrees.  Specialty Comments:  No specialty comments available.  Imaging: No results found.   PMFS History: Patient Active Problem List   Diagnosis Date Noted   Pulmonary nodules 07/12/2020   PAD (peripheral artery disease) (HCC)    Chronic diastolic heart failure (HCC) 07/06/2020   Abdominal aortic aneurysm (AAA) (HCC)    Aortic atherosclerosis (HCC) 05/26/2020   AAA (abdominal aortic aneurysm) without rupture (HCC) 05/26/2020    Thrombocytopenia (HCC) 08/13/2017   Dizziness    Precordial pain    History of coronary artery disease    Varicose veins of right lower extremity with complications 10/27/2016   HLD (hyperlipidemia) 09/28/2014   Obstructive sleep apnea 09/14/2014   Morbid obesity (HCC) 09/12/2014   Coronary artery disease    Hx of NSTEMI 5/16 tx with BMS to pRCA and DES to RPLB3 09/10/2014   Essential hypertension 09/10/2014   Tobacco use    GERD (gastroesophageal reflux disease)    Past Medical History:  Diagnosis Date   Abdominal aortic aneurysm (AAA) (HCC)    CT 1/22: infrarenal 3.8 cm - repeat US in 2 years // AAA [place] 2/22: AAA 3.8 cm; aorto iliac atherosclerosis without hemodynamically significant stenosis   Aortic atherosclerosis (HCC)    CT in 03/2020   CAD (coronary artery disease) 09/2014   a. NSTEMI >> LHC 5/16:  mLAD 15, pRCA 90, mRCA 40, RPLB3 80, EF normal with inf HK >> PCI:  BMS to pRCA and DES to RPLB3 // Cath 11/21: Patent RCA stent, patent RPL stent; medical therapy    GERD (gastroesophageal reflux disease)    History of echocardiogram    Echo 6/16:  EF 50-55%, no RWMA, Gr 1 DD   HLD (hyperlipidemia)    Hattie Perch 03/15/2017   HTN (hypertension)    Hx of NSTEMI 5/16 tx with BMS to pRCA and DES to RPLB3 09/10/2014   Hyperglycemia    OSA (obstructive sleep apnea)    "dx'd; couldn't tolerate mask" (03/15/2017)   PAD (peripheral artery disease) (HCC)    AAA [place] in 06/2020: aorto iliac atherosclerosis   Tobacco use    Type II diabetes mellitus (HCC)    Varicose veins of both lower extremities    S/P ablation 02/2017    Family History  Problem Relation Age of Onset   Heart attack Maternal Uncle    Heart attack Paternal Grandmother    Cancer Maternal Grandfather    Heart attack Maternal Grandmother    Stroke Neg Hx    Hypertension Neg Hx     Past Surgical History:  Procedure Laterality Date   CARDIAC CATHETERIZATION N/A 09/11/2014   Procedure: Left Heart Cath and Coronary  Angiography;  Surgeon: Marykay Lex, MD;  Location: [facility] INVASIVE CV LAB CUPID;  Service: Cardiovascular;  Laterality: N/A;   CARDIAC CATHETERIZATION  09/11/2014   Procedure: Coronary Stent Intervention;  Surgeon: Marykay Lex, MD;  Location: [facility] INVASIVE CV LAB CUPID;  Service: Cardiovascular;;   ENDOVENOUS ABLATION SAPHENOUS VEIN W/ LASER Right 02/22/2017   endovenous laser ablation R GSV and stab phlebectomy >20 incisions R leg by Josephina Gip MD    LEFT HEART CATH AND CORONARY ANGIOGRAPHY N/A 04/01/2020   Procedure: LEFT HEART CATH AND CORONARY ANGIOGRAPHY;  Surgeon: Runell Gess, MD;  Location: [facility] INVASIVE CV LAB;  Service: Cardiovascular;  Laterality: N/A;   Social History   Occupational History   Occupation: Service water purification systems  Tobacco Use   Smoking status: Every Day    Packs/day: 1.50    Years: 40.00    Pack years: 60.00    Types: Cigarettes    Start date: 1978   Smokeless tobacco: Former    Types: Chew   Tobacco comments:    currently smoking 0.5ppd as of 07/12/20  Vaping Use   Vaping Use: Never used  Substance and Sexual Activity   Alcohol use: Yes    Alcohol/week: 6.0 standard drinks    Types: 6 Cans of beer per week   Drug use: No   Sexual activity: Not Currently

## 2020-11-27 ENCOUNTER — Other Ambulatory Visit: Payer: Self-pay

## 2020-11-27 MED FILL — Chlorthalidone Tab 25 MG: ORAL | 30 days supply | Qty: 30 | Fill #3 | Status: AC

## 2020-11-27 MED FILL — Losartan Potassium Tab 100 MG: ORAL | 30 days supply | Qty: 30 | Fill #3 | Status: AC

## 2020-11-27 MED FILL — Rosuvastatin Calcium Tab 40 MG: ORAL | 30 days supply | Qty: 30 | Fill #3 | Status: AC

## 2020-11-29 ENCOUNTER — Other Ambulatory Visit: Payer: Self-pay

## 2020-12-02 ENCOUNTER — Other Ambulatory Visit: Payer: Self-pay

## 2020-12-03 ENCOUNTER — Other Ambulatory Visit: Payer: Self-pay | Admitting: *Deleted

## 2020-12-03 ENCOUNTER — Other Ambulatory Visit: Payer: Self-pay

## 2020-12-03 DIAGNOSIS — E782 Mixed hyperlipidemia: Secondary | ICD-10-CM

## 2020-12-03 DIAGNOSIS — I2510 Atherosclerotic heart disease of native coronary artery without angina pectoris: Secondary | ICD-10-CM

## 2020-12-03 LAB — HEPATIC FUNCTION PANEL
ALT: 20 IU/L (ref 0–44)
AST: 14 IU/L (ref 0–40)
Albumin: 4.2 g/dL (ref 3.8–4.9)
Alkaline Phosphatase: 74 IU/L (ref 44–121)
Bilirubin Total: 0.4 mg/dL (ref 0.0–1.2)
Bilirubin, Direct: 0.16 mg/dL (ref 0.00–0.40)
Total Protein: 6.6 g/dL (ref 6.0–8.5)

## 2020-12-03 LAB — LIPID PANEL
Chol/HDL Ratio: 2.6 ratio (ref 0.0–5.0)
Cholesterol, Total: 99 mg/dL — ABNORMAL LOW (ref 100–199)
HDL: 38 mg/dL — ABNORMAL LOW (ref >39)
LDL Chol Calc (NIH): 46 mg/dL (ref 0–99)
Triglycerides: 70 mg/dL (ref 0–149)
VLDL Cholesterol Cal: 15 mg/dL (ref 5–40)

## 2020-12-04 ENCOUNTER — Other Ambulatory Visit: Payer: Self-pay | Admitting: Nurse Practitioner

## 2020-12-04 ENCOUNTER — Other Ambulatory Visit: Payer: Self-pay

## 2020-12-04 NOTE — Telephone Encounter (Signed)
Requested medications are due for refill today.  unknown  Requested medications are on the active medications list.  no  Last refill. 10/27/2020  Future visit scheduled.   no  Notes to clinic.  Medication not delegated. Medication appears to have been discontinued.

## 2020-12-05 ENCOUNTER — Other Ambulatory Visit: Payer: Self-pay

## 2020-12-05 MED ORDER — ACETAMINOPHEN-CODEINE #3 300-30 MG PO TABS
1.0000 | ORAL_TABLET | Freq: Two times a day (BID) | ORAL | 0 refills | Status: DC | PRN
  Filled 2020-12-05: qty 30, 15d supply, fill #0

## 2020-12-10 ENCOUNTER — Other Ambulatory Visit: Payer: Self-pay

## 2020-12-10 MED FILL — Clopidogrel Bisulfate Tab 75 MG (Base Equiv): ORAL | 30 days supply | Qty: 30 | Fill #3 | Status: AC

## 2020-12-11 ENCOUNTER — Other Ambulatory Visit: Payer: Self-pay

## 2020-12-19 ENCOUNTER — Other Ambulatory Visit: Payer: Self-pay

## 2020-12-20 ENCOUNTER — Other Ambulatory Visit: Payer: Self-pay

## 2020-12-27 ENCOUNTER — Other Ambulatory Visit: Payer: Self-pay

## 2020-12-28 MED FILL — Rosuvastatin Calcium Tab 40 MG: ORAL | 30 days supply | Qty: 30 | Fill #4 | Status: AC

## 2020-12-28 MED FILL — Chlorthalidone Tab 25 MG: ORAL | 30 days supply | Qty: 30 | Fill #4 | Status: AC

## 2020-12-28 MED FILL — Losartan Potassium Tab 100 MG: ORAL | 30 days supply | Qty: 30 | Fill #4 | Status: AC

## 2020-12-30 ENCOUNTER — Other Ambulatory Visit: Payer: Self-pay

## 2021-01-05 ENCOUNTER — Other Ambulatory Visit: Payer: Self-pay | Admitting: Nurse Practitioner

## 2021-01-05 DIAGNOSIS — E1165 Type 2 diabetes mellitus with hyperglycemia: Secondary | ICD-10-CM

## 2021-01-05 MED ORDER — EMPAGLIFLOZIN 25 MG PO TABS
ORAL_TABLET | ORAL | 1 refills | Status: DC
  Filled 2021-01-05: qty 30, 30d supply, fill #0
  Filled 2021-02-06: qty 30, 30d supply, fill #1

## 2021-01-05 MED FILL — Clopidogrel Bisulfate Tab 75 MG (Base Equiv): ORAL | 30 days supply | Qty: 30 | Fill #4 | Status: AC

## 2021-01-05 NOTE — Telephone Encounter (Signed)
Requested medication (s) are due for refill today: yes  Requested medication (s) are on the active medication list: no  Last refill:  12/05/20  Future visit scheduled: no  Notes to clinic:  med not delegated to NT to RF or refuse// 12/05/20   Requested Prescriptions  Pending Prescriptions Disp Refills   acetaminophen-codeine (TYLENOL #3) 300-30 MG tablet 30 tablet     Sig: Take 1 tablet by mouth every 12 (twelve) hours as needed for moderate pain.     Not Delegated - Analgesics:  Opioid Agonist Combinations Failed - 01/05/2021 11:28 AM      Failed - This refill cannot be delegated      Failed - Urine Drug Screen completed in last 360 days      Passed - Valid encounter within last 6 months    Recent Outpatient Visits           2 months ago Type 2 diabetes mellitus with hyperglycemia, without long-term current use of insulin [facility])   [place], [place] W, NP   6 months ago Type 2 diabetes mellitus with hyperglycemia, without long-term current use of insulin [facility])   [place], Vernia Buff, NP   7 months ago Encounter to establish care   [place], [place] W, NP   3 years ago Type 2 diabetes mellitus without complication, without long-term current use of insulin ([place])   [place], Vernia Buff, NP   3 years ago Essential hypertension   [place], [place] R, [place]              Signed Prescriptions Disp Refills   empagliflozin (JARDIANCE) 25 MG TABS tablet 30 tablet 1    Sig: TAKE 1 TABLET (25 MG TOTAL) BY MOUTH DAILY BEFORE BREAKFAST.     Endocrinology:  Diabetes - SGLT2 Inhibitors Failed - 01/05/2021 11:28 AM      Failed - Cr in normal range and within 360 days    Creat  Date Value Ref Range Status  12/27/2015 0.75 0.70 - 1.33 mg/dL Final    Comment:      For patients > or = 60 years of  age: The upper reference limit for Creatinine is approximately 13% higher for people identified as African-American.      Creatinine, Ser  Date Value Ref Range Status  11/12/2020 1.36 (H) 0.61 - 1.24 mg/dL Final   Creatinine, Urine  Date Value Ref Range Status  11/18/2015 390 (H) 20 - 370 mg/dL Final    Comment:    Result confirmed by automatic dilution. Result repeated and verified.           Failed - HBA1C is between 0 and 7.9 and within 180 days    HbA1c, POC (controlled diabetic range)  Date Value Ref Range Status  10/11/2020 8.8 (A) 0.0 - 7.0 % Final          Failed - eGFR in normal range and within 360 days    GFR calc Af Amer  Date Value Ref Range Status  05/24/2020 84 >59 mL/min/1.73 Final    Comment:    **In accordance with recommendations from the NKF-ASN Task force,**   [facility] is in the process of updating its eGFR calculation to the   2021 CKD-EPI creatinine equation that estimates kidney function   without a race variable.    GFR, Estimated  Date Value Ref Range Status  11/12/2020 60 (L) >60 mL/min Final    Comment:    (NOTE) Calculated using the CKD-EPI Creatinine Equation (2021)    GFR  Date Value Ref Range Status  11/26/2014 102.54 >60.00 mL/min Final          Passed - LDL in normal range and within 360 days    LDL Chol Calc (NIH)  Date Value Ref Range Status  12/03/2020 46 0 - 99 mg/dL Final          Passed - Valid encounter within last 6 months    Recent Outpatient Visits           2 months ago Type 2 diabetes mellitus with hyperglycemia, without long-term current use of insulin [facility])   [place], [place] W, NP   6 months ago Type 2 diabetes mellitus with hyperglycemia, without long-term current use of insulin [facility])   [place], Vernia Buff, NP   7 months ago Encounter to establish care   [place], [place] W, NP   3  years ago Type 2 diabetes mellitus without complication, without long-term current use of insulin [facility])   [place], Vernia Buff, NP   3 years ago Essential hypertension   [place], Maylon Pe[place], [place]

## 2021-01-05 NOTE — Telephone Encounter (Signed)
Requested Prescriptions  Pending Prescriptions Disp Refills  . acetaminophen-codeine (TYLENOL #3) 300-30 MG tablet 30 tablet     Sig: Take 1 tablet by mouth every 12 (twelve) hours as needed for moderate pain.     Not Delegated - Analgesics:  Opioid Agonist Combinations Failed - 01/05/2021 11:28 AM      Failed - This refill cannot be delegated      Failed - Urine Drug Screen completed in last 360 days      Passed - Valid encounter within last 6 months    Recent Outpatient Visits          2 months ago Type 2 diabetes mellitus with hyperglycemia, without long-term current use of insulin [facility])   [place], [place] W, NP   6 months ago Type 2 diabetes mellitus with hyperglycemia, without long-term current use of insulin [facility])   [place], Vernia Buff, NP   7 months ago Encounter to establish care   [place], [place] W, NP   3 years ago Type 2 diabetes mellitus without complication, without long-term current use of insulin ([place])   [place], [place] W, NP   3 years ago Essential hypertension   [place], [place], [place]             . empagliflozin (JARDIANCE) 25 MG TABS tablet 30 tablet 1    Sig: TAKE 1 TABLET (25 MG TOTAL) BY MOUTH DAILY BEFORE BREAKFAST.     Endocrinology:  Diabetes - SGLT2 Inhibitors Failed - 01/05/2021 11:28 AM      Failed - Cr in normal range and within 360 days    Creat  Date Value Ref Range Status  12/27/2015 0.75 0.70 - 1.33 mg/dL Final    Comment:      For patients > or = 60 years of age: The upper reference limit for Creatinine is approximately 13% higher for people identified as African-American.      Creatinine, Ser  Date Value Ref Range Status  11/12/2020 1.36 (H) 0.61 - 1.24 mg/dL Final   Creatinine, Urine  Date Value Ref Range Status  11/18/2015 390  (H) 20 - 370 mg/dL Final    Comment:    Result confirmed by automatic dilution. Result repeated and verified.          Failed - HBA1C is between 0 and 7.9 and within 180 days    HbA1c, POC (controlled diabetic range)  Date Value Ref Range Status  10/11/2020 8.8 (A) 0.0 - 7.0 % Final         Failed - eGFR in normal range and within 360 days    GFR calc Af Amer  Date Value Ref Range Status  05/24/2020 84 >59 mL/min/1.73 Final    Comment:    **In accordance with recommendations from the NKF-ASN Task force,**   [facility] is in the process of updating its eGFR calculation to the   2021 CKD-EPI creatinine equation that estimates kidney function   without a race variable.    GFR, Estimated  Date Value Ref Range Status  11/12/2020 60 (L) >60 mL/min Final    Comment:    (NOTE) Calculated using the CKD-EPI Creatinine Equation (2021)    GFR  Date Value Ref Range Status  11/26/2014 102.54 >60.00 mL/min Final         Passed - LDL in  normal range and within 360 days    LDL Chol Calc (NIH)  Date Value Ref Range Status  12/03/2020 46 0 - 99 mg/dL Final         Passed - Valid encounter within last 6 months    Recent Outpatient Visits          2 months ago Type 2 diabetes mellitus with hyperglycemia, without long-term current use of insulin [facility])   [place], [place] W, NP   6 months ago Type 2 diabetes mellitus with hyperglycemia, without long-term current use of insulin [facility])   [place], Vernia Buff, NP   7 months ago Encounter to establish care   [place], [place] W, NP   3 years ago Type 2 diabetes mellitus without complication, without long-term current use of insulin [facility])   [place], Vernia Buff, NP   3 years ago Essential hypertension   [place], Maylon Pe[place], [place]

## 2021-01-06 ENCOUNTER — Other Ambulatory Visit: Payer: Self-pay

## 2021-01-08 ENCOUNTER — Other Ambulatory Visit: Payer: Self-pay

## 2021-01-08 MED ORDER — ACETAMINOPHEN-CODEINE #3 300-30 MG PO TABS
1.0000 | ORAL_TABLET | Freq: Two times a day (BID) | ORAL | 0 refills | Status: DC | PRN
  Filled 2021-01-08: qty 30, 15d supply, fill #0

## 2021-01-09 ENCOUNTER — Other Ambulatory Visit: Payer: Self-pay

## 2021-01-17 ENCOUNTER — Other Ambulatory Visit: Payer: Self-pay | Admitting: Nurse Practitioner

## 2021-01-17 DIAGNOSIS — G8929 Other chronic pain: Secondary | ICD-10-CM

## 2021-01-18 NOTE — Telephone Encounter (Signed)
Requested medications are due for refill today.  Unknown  Requested medications are on the active medications list.  no  Last refill. 10/11/2020  Future visit scheduled.   no  Notes to clinic.  Medication not delegated. R was expired 12/29/2020.

## 2021-01-21 ENCOUNTER — Other Ambulatory Visit: Payer: Self-pay

## 2021-01-23 ENCOUNTER — Other Ambulatory Visit: Payer: Self-pay

## 2021-01-23 MED ORDER — METHOCARBAMOL 750 MG PO TABS
750.0000 mg | ORAL_TABLET | Freq: Two times a day (BID) | ORAL | 1 refills | Status: AC
  Filled 2021-01-23: qty 60, 30d supply, fill #0
  Filled 2021-02-17: qty 60, 30d supply, fill #1

## 2021-01-25 MED FILL — Chlorthalidone Tab 25 MG: ORAL | 30 days supply | Qty: 30 | Fill #5 | Status: AC

## 2021-01-25 MED FILL — Rosuvastatin Calcium Tab 40 MG: ORAL | 30 days supply | Qty: 30 | Fill #5 | Status: AC

## 2021-01-27 ENCOUNTER — Other Ambulatory Visit: Payer: Self-pay

## 2021-01-27 ENCOUNTER — Emergency Department (HOSPITAL_COMMUNITY): Payer: Self-pay

## 2021-01-27 ENCOUNTER — Emergency Department (HOSPITAL_COMMUNITY)
Admission: EM | Admit: 2021-01-27 | Discharge: 2021-01-27 | Disposition: A | Discharge status: Home / Self Care | Payer: Self-pay | Attending: Emergency Medicine | Admitting: Emergency Medicine

## 2021-01-27 DIAGNOSIS — Z8679 Personal history of other diseases of the circulatory system: Secondary | ICD-10-CM | POA: Insufficient documentation

## 2021-01-27 DIAGNOSIS — R1030 Lower abdominal pain, unspecified: Secondary | ICD-10-CM | POA: Insufficient documentation

## 2021-01-27 DIAGNOSIS — Z79899 Other long term (current) drug therapy: Secondary | ICD-10-CM | POA: Insufficient documentation

## 2021-01-27 DIAGNOSIS — F17210 Nicotine dependence, cigarettes, uncomplicated: Secondary | ICD-10-CM | POA: Insufficient documentation

## 2021-01-27 DIAGNOSIS — M5459 Other low back pain: Secondary | ICD-10-CM | POA: Insufficient documentation

## 2021-01-27 DIAGNOSIS — Z7901 Long term (current) use of anticoagulants: Secondary | ICD-10-CM | POA: Insufficient documentation

## 2021-01-27 DIAGNOSIS — Z7984 Long term (current) use of oral hypoglycemic drugs: Secondary | ICD-10-CM | POA: Insufficient documentation

## 2021-01-27 DIAGNOSIS — E119 Type 2 diabetes mellitus without complications: Secondary | ICD-10-CM | POA: Insufficient documentation

## 2021-01-27 DIAGNOSIS — M549 Dorsalgia, unspecified: Secondary | ICD-10-CM

## 2021-01-27 DIAGNOSIS — I5032 Chronic diastolic (congestive) heart failure: Secondary | ICD-10-CM | POA: Insufficient documentation

## 2021-01-27 DIAGNOSIS — I110 Hypertensive heart disease with heart failure: Secondary | ICD-10-CM | POA: Insufficient documentation

## 2021-01-27 LAB — CBC WITH DIFFERENTIAL/PLATELET
Abs Immature Granulocytes: 0.03 10*3/uL (ref 0.00–0.07)
Basophils Absolute: 0.1 10*3/uL (ref 0.0–0.1)
Basophils Relative: 1 %
Eosinophils Absolute: 0.3 10*3/uL (ref 0.0–0.5)
Eosinophils Relative: 4 %
HCT: 42.2 % (ref 39.0–52.0)
Hemoglobin: 13.8 g/dL (ref 13.0–17.0)
Immature Granulocytes: 0 %
Lymphocytes Relative: 32 %
Lymphs Abs: 2.3 10*3/uL (ref 0.7–4.0)
MCH: 30.9 pg (ref 26.0–34.0)
MCHC: 32.7 g/dL (ref 30.0–36.0)
MCV: 94.6 fL (ref 80.0–100.0)
Monocytes Absolute: 0.5 10*3/uL (ref 0.1–1.0)
Monocytes Relative: 7 %
Neutro Abs: 4 10*3/uL (ref 1.7–7.7)
Neutrophils Relative %: 56 %
Platelets: 171 10*3/uL (ref 150–400)
RBC: 4.46 MIL/uL (ref 4.22–5.81)
RDW: 14.3 % (ref 11.5–15.5)
WBC: 7.2 10*3/uL (ref 4.0–10.5)
nRBC: 0 % (ref 0.0–0.2)

## 2021-01-27 LAB — BASIC METABOLIC PANEL
Anion gap: 11 (ref 5–15)
BUN: 23 mg/dL — ABNORMAL HIGH (ref 6–20)
CO2: 23 mmol/L (ref 22–32)
Calcium: 9.3 mg/dL (ref 8.9–10.3)
Chloride: 101 mmol/L (ref 98–111)
Creatinine, Ser: 1.11 mg/dL (ref 0.61–1.24)
GFR, Estimated: >60 mL/min (ref >60)
Glucose, Bld: 106 mg/dL — ABNORMAL HIGH (ref 70–99)
Potassium: 4.2 mmol/L (ref 3.5–5.1)
Sodium: 135 mmol/L (ref 135–145)

## 2021-01-27 MED ORDER — PREDNISONE 20 MG PO TABS
60.0000 mg | ORAL_TABLET | Freq: Once | ORAL | Status: AC
  Administered 2021-01-27: 60 mg via ORAL
  Filled 2021-01-27: qty 3

## 2021-01-27 MED ORDER — ONDANSETRON HCL 4 MG/2ML IJ SOLN
4.0000 mg | Freq: Once | INTRAMUSCULAR | Status: AC
  Administered 2021-01-27: 4 mg via INTRAVENOUS
  Filled 2021-01-27: qty 2

## 2021-01-27 MED ORDER — MORPHINE SULFATE (PF) 4 MG/ML IV SOLN
4.0000 mg | Freq: Once | INTRAVENOUS | Status: AC
  Administered 2021-01-27: 4 mg via INTRAVENOUS
  Filled 2021-01-27: qty 1

## 2021-01-27 MED ORDER — HYDROCODONE-ACETAMINOPHEN 5-325 MG PO TABS: 1.0000 | ORAL_TABLET | Freq: Four times a day (QID) | ORAL | 0 refills | Status: DC | PRN

## 2021-01-27 MED ORDER — HYDROMORPHONE HCL 1 MG/ML IJ SOLN
1.0000 mg | Freq: Once | INTRAMUSCULAR | Status: AC
Start: 2021-01-27 — End: 2021-01-27
  Administered 2021-01-27: 1 mg via INTRAVENOUS
  Filled 2021-01-27: qty 1

## 2021-01-27 MED ORDER — IOHEXOL 350 MG/ML SOLN
75.0000 mL | Freq: Once | INTRAVENOUS | Status: AC | PRN
  Administered 2021-01-27: 75 mL via INTRAVENOUS

## 2021-01-27 MED ORDER — PREDNISONE 10 MG PO TABS
ORAL_TABLET | Freq: Every day | ORAL | 0 refills | Status: DC
  Filled 2021-01-27: qty 42, 12d supply, fill #0

## 2021-01-27 NOTE — ED Notes (Addendum)
Pt ambulated to the bathroom with some difficulty but with stable gait.

## 2021-01-27 NOTE — ED Provider Notes (Signed)
Patient is a 60 year old male whose care was transferred to me at shift change from [place] PA-C.  Her HPI is below:  William Young is a 60 y.o. male known history of AAA, CAD, chronic back pain followed by Dr. Ophelia Charter who presents for evaluation of back pain.  Has chronic back pain however was at work, bent over to pick something up and felt sharp pain.  Goes from his right lower back into his right abdomen down his right leg.  He has associated paresthesias to his right leg.  Denies bowel or bladder incontinence, saddle paresthesia, IV drug use, fever or malignancy.  Pain worse with movement.  No fever, chills, nausea vomiting, chest pain, shortness of breath, urinary symptoms.  Denies additional aggravating or relieving factors.  Rates his pain a 10/10.   States he was told by Dr. Ophelia Charter he needed to have surgery however he needed his A1c to be lower prior to this.   History obtained from patient and past medical records.  No interpreter used Physical Exam  BP 125/77   Pulse 65   Temp 97.9 F (36.6 C)   Resp 18   SpO2 95%   Physical Exam Constitutional: Pt appears well-developed and well-nourished. No distress.  HENT:  Head: Normocephalic and atraumatic.  Mouth/Throat: Oropharynx is clear and moist. No oropharyngeal exudate.  Eyes: Conjunctivae are normal.  Neck: Normal range of motion. Neck supple.  Full ROM without pain  Cardiovascular: Normal rate, regular rhythm and intact distal pulses.   Pulmonary/Chest: Effort normal and breath sounds normal. No respiratory distress. Pt has no wheezes.  Abdominal: Soft. Pt exhibits no distension. There is no  rebound or guarding. Tenderness to RLQ, right mid abdomen.No abd bruit or pulsatile mass Musculoskeletal:  Full range of motion of the T-spine and L-spine with flexion, hyperextension, and lateral flexion. No midline tenderness or stepoffs. No tenderness to palpation of the spinous processes of the T-spine or L-spine. Mild tenderness to  palpation of the paraspinous muscles of the L-spine. Positive straight leg raise. Lymphadenopathy:    Pt has no cervical adenopathy.  Neurological: Pt is alert. Pt has normal reflexes.  Speech is clear and goal oriented, follows commands Normal 5/5 strength in upper and lower extremities bilaterally including dorsiflexion and plantar flexion, strong and equal grip strength Sensation normal to light and sharp touch Moves extremities without ataxia, coordination intact Normal gait Normal balance No Clonus Skin: Skin is warm and dry. No rash noted or lesions noted. Pt is not diaphoretic. No erythema, ecchymosis,edema or warmth.  Psychiatric: Pt has a normal mood and affect. Behavior is normal.  Nursing note and vitals reviewed.  ED Course/Procedures     Procedures  MDM  Patient is a 60 year old male whose care was transferred to me from the previous PA-C.  Please see her note below for further information.  In summary, patient here for evaluation of new onset abdominal pain as well as back pain with a known AAA.  States his back pain worsened after doing previous heavy lifting.  He is followed by Dr. Ophelia Charter with orthopedics.  No bowel or bladder incontinence, saddle anesthesia, malignancy, fever, IV drug use.  Given his history of AAA previous provider obtained a CTA of the abdomen/pelvis as well as a CT of the lumbar spine which were both pending and have since resulted with findings as noted below.  IMPRESSION:  VASCULAR     1. Similar size and appearance of 3.7 cm infrarenal abdominal aortic  aneurysm. Recommend  follow-up ultrasound every 2 years. This  recommendation follows ACR consensus guidelines: White Paper of the  ACR Incidental Findings Committee II on Vascular Findings. J Am Coll  Radiol 2013; 10:789-794.  2. No CTA evidence of dissection or periaortic stranding to suggest  rupture or inflammation.     NON-VASCULAR     1. No acute abdominopelvic process.  2. Sigmoid  diverticulosis. Additional chronic and senescent changes,  as above.  3. Multilevel degenerative changes of lumbar spine. No acute osseous  abnormality.   Patient reassessed after prednisone, morphine, Zofran, Dilaudid.  Symptoms appear to be significantly improved. Will discharge on a additional course of prednisone.  We will also discharge on a course of Vicodin for breakthrough pain.  We discussed safety regarding this medication.  Feel that he is stable for discharge at this time and he is agreeable.  His questions were answered and he was amicable at the time of discharge.       Placido Sou, PA-C 01/27/21 1756    Sloan Leiter, DO 01/28/21 0011

## 2021-01-27 NOTE — Discharge Instructions (Signed)
Please follow-up with your orthopedist, Dr. Ophelia Charter as soon as possible.  I have prescribed you a strong narcotic called Vicodin. Please only take this as prescribed. This medication also has tylenol in it, so please be sure you are not taking more than 3000 mg of tylenol per day. Do not drive or operate heavy machinery after taking this medication. Do not mix it with alcohol.   I am prescribing you a strong steroid medication called prednisone.  Please only take this as prescribed.  Please take it early in the morning, as this medication can be stimulating and make it difficult to sleep at night.  Please return to the emergency department if you develop worsening pain, numbness, weakness, bowel or bladder incontinence, or generally worsening symptoms.  It was a pleasure to meet you.

## 2021-01-27 NOTE — ED Triage Notes (Signed)
Pt here from home with c/o back pain ,that he re injured at work , has been seen by neuro and was told he needed a mri but has not had it dine yet

## 2021-01-27 NOTE — ED Provider Notes (Signed)
[facility] EMERGENCY DEPARTMENT Provider Note   CSN: [id number] Arrival date & time: 01/27/21  0941     History Back pain   William Young is a 60 y.o. male known history of AAA, CAD, chronic back pain followed by Dr. Lorin Mercy who presents for evaluation of back pain.  Has chronic back pain however was at work, bent over to pick something up and felt sharp pain.  Goes from his right lower back into his right abdomen down his right leg.  He has associated paresthesias to lateral aspect right leg.  Denies bowel or bladder incontinence, saddle paresthesia, IV drug use, fever or malignancy.  Pain worse with movement.  No fever, chills, nausea, vomiting, chest pain, shortness of breath, urinary symptoms.  Denies additional aggravating or relieving factors.  Rates his pain a 10/10.  States he was told by Dr. Lorin Mercy (Ortho) he needed to have surgery however he needed his A1c to be lower prior to this, according to patient.   History obtained from patient and past medical records.  No interpreter used  HPI     Past Medical History:  Diagnosis Date   Abdominal aortic aneurysm (AAA) ([place])    CT 1/22: infrarenal 3.8 cm - repeat US in 2 years // AAA [place] 2/22: AAA 3.8 cm; aorto iliac atherosclerosis without hemodynamically significant stenosis   Aortic atherosclerosis ([place])    CT in 03/2020   CAD (coronary artery disease) 09/2014   a. NSTEMI >> LHC 5/16:  mLAD 15, pRCA 90, mRCA 40, RPLB3 80, EF normal with inf HK >> PCI:  BMS to pRCA and DES to RPLB3 // Cath 11/21: Patent RCA stent, patent RPL stent; medical therapy    GERD (gastroesophageal reflux disease)    History of echocardiogram    Echo 6/16:  EF 50-55%, no RWMA, Gr 1 DD   HLD (hyperlipidemia)    Archie Endo 03/15/2017   HTN (hypertension)    Hx of NSTEMI 5/16 tx with BMS to [facility] and DES to RPLB3 09/10/2014   Hyperglycemia    OSA (obstructive sleep apnea)    "dx'd; couldn't tolerate mask" (03/15/2017)   PAD (peripheral artery  disease) ([place])    AAA [place] in 06/2020: aorto iliac atherosclerosis   Tobacco use    Type II diabetes mellitus ([place])    Varicose veins of both lower extremities    S/P ablation 02/2017    Patient Active Problem List   Diagnosis Date Noted   Pulmonary nodules 07/12/2020   PAD (peripheral artery disease) ([facility])    Chronic diastolic heart failure ([place]) 07/06/2020   Abdominal aortic aneurysm (AAA) ([place])    Aortic atherosclerosis ([place]) 05/26/2020   AAA (abdominal aortic aneurysm) without rupture ([place]) 05/26/2020   Thrombocytopenia ([place]) 08/13/2017   Dizziness    Precordial pain    History of coronary artery disease    Varicose veins of right lower extremity with complications 27/74/1287   HLD (hyperlipidemia) 09/28/2014   Obstructive sleep apnea 09/14/2014   Morbid obesity ([place]) 09/12/2014   Coronary artery disease    Hx of NSTEMI 5/16 tx with BMS to pRCA and DES to RPLB3 09/10/2014   Essential hypertension 09/10/2014   Tobacco use    GERD (gastroesophageal reflux disease)     Past Surgical History:  Procedure Laterality Date   CARDIAC CATHETERIZATION N/A 09/11/2014   Procedure: Left Heart Cath and Coronary Angiography;  Surgeon: Leonie Man, MD;  Location: [facility] INVASIVE CV LAB CUPID;  Service: Cardiovascular;  Laterality:  N/A;   CARDIAC CATHETERIZATION  09/11/2014   Procedure: Coronary Stent Intervention;  Surgeon: Leonie Man, MD;  Location: [facility] INVASIVE CV LAB CUPID;  Service: Cardiovascular;;   ENDOVENOUS ABLATION SAPHENOUS VEIN W/ LASER Right 02/22/2017   endovenous laser ablation R GSV and stab phlebectomy >20 incisions R leg by Tinnie Gens MD    LEFT HEART CATH AND CORONARY ANGIOGRAPHY N/A 04/01/2020   Procedure: LEFT HEART CATH AND CORONARY ANGIOGRAPHY;  Surgeon: Lorretta Harp, MD;  Location: [place] CV LAB;  Service: Cardiovascular;  Laterality: N/A;       Family History  Problem Relation Age of Onset   Heart attack Maternal Uncle    Heart attack Paternal  Grandmother    Cancer Maternal Grandfather    Heart attack Maternal Grandmother    Stroke Neg Hx    Hypertension Neg Hx     Social History   Tobacco Use   Smoking status: Every Day    Packs/day: 1.50    Years: 40.00    Pack years: 60.00    Types: Cigarettes    Start date: 1978   Smokeless tobacco: Former    Types: Chew   Tobacco comments:    currently smoking 0.5ppd as of 07/12/20  Vaping Use   Vaping Use: Never used  Substance Use Topics   Alcohol use: Yes    Alcohol/week: 6.0 standard drinks    Types: 6 Cans of beer per week   Drug use: No    Home Medications Prior to Admission medications   Medication Sig Start Date End Date Taking? Authorizing Provider  acetaminophen-codeine (TYLENOL #3) 300-30 MG tablet Take 1 tablet by mouth every 12 (twelve) hours as needed for moderate pain. 01/08/21 02/07/21  Gildardo Pounds, NP  Blood Glucose Monitoring Suppl (TRUE METRIX METER) w/Device KIT Use as instructed. Check blood glucose level by fingerstick twice per day. E11.65 05/26/20   Gildardo Pounds, NP  chlorthalidone (HYGROTON) 25 MG tablet TAKE 1 TABLET BY MOUTH DAILY. TO REPLACE HCTZ 03/29/20 03/29/21  Fay Records, MD  clopidogrel (PLAVIX) 75 MG tablet TAKE 1 TABLET (75 MG TOTAL) BY MOUTH DAILY. 06/10/20   Fay Records, MD  doxycycline (VIBRAMYCIN) 100 MG capsule Take 1 capsule (100 mg total) by mouth 2 (two) times daily. 11/12/20   Lacretia Leigh, MD  empagliflozin (JARDIANCE) 25 MG TABS tablet TAKE 1 TABLET (25 MG TOTAL) BY MOUTH DAILY BEFORE BREAKFAST. 01/05/21 01/05/22  Gildardo Pounds, NP  ezetimibe (ZETIA) 10 MG tablet Take 1 tablet (10 mg total) by mouth daily. 09/02/20   Richardson Dopp T, PA-C  glimepiride (AMARYL) 2 MG tablet Take 1 tablet (2 mg total) by mouth daily before breakfast. 10/11/20   Gildardo Pounds, NP  glucose blood test strip USE AS INSTRUCTED TO CHECK BLOOD GLUCOSE LEVEL BY FINGERSTICK TWICE DAILY Patient taking differently: USE AS INSTRUCTED TO CHECK BLOOD  GLUCOSE LEVEL BY FINGERSTICK TWICE DAILY 05/26/20 05/26/21  Gildardo Pounds, NP  losartan (COZAAR) 100 MG tablet TAKE 1 TABLET (100 MG TOTAL) BY MOUTH DAILY. 06/27/20 06/27/21  Fay Records, MD  Menthol, Topical Analgesic, (BIOFREEZE) 4 % GEL Apply 1 application topically once as needed (for pain).    [provider]  metFORMIN (GLUCOPHAGE) 500 MG tablet Take 1 tablet (500 mg total) by mouth 2 (two) times daily with a meal. 10/11/20 02/07/21  Gildardo Pounds, NP  methocarbamol (ROBAXIN-750) 750 MG tablet Take 1 tablet (750 mg total) by mouth in the  morning and at bedtime. 01/23/21 02/22/21  Gildardo Pounds, NP  nitroGLYCERIN (NITROSTAT) 0.4 MG SL tablet Place 1 tablet (0.4 mg total) every 5 (five) minutes x 3 doses as needed under the tongue for chest pain. 03/16/17   Bhagat, Crista Luria, PA  rosuvastatin (CRESTOR) 40 MG tablet TAKE 1 TABLET (40 MG TOTAL) BY MOUTH DAILY. 05/27/20 05/27/21  Richardson Dopp T, PA-C  TRUEplus Lancets 28G MISC USE AS INSTRUCTED CHECK BLOOD GLUCOSE LEVEL BY FINGERSTICK TWICE DAILY 05/26/20 05/26/21  Gildardo Pounds, NP  Vitamin D, Ergocalciferol, (DRISDOL) 1.25 MG (50000 UNIT) CAPS capsule Take 1 capsule (50,000 Units total) by mouth every 7 (seven) days. 10/13/20   Gildardo Pounds, NP  atorvastatin (LIPITOR) 80 MG tablet Take 1 tablet (80 mg total) by mouth daily. 04/03/20 05/27/20  Fay Records, MD    Allergies    Aspirin and Ibuprofen  Review of Systems   Review of Systems  Constitutional: Negative.   HENT: Negative.    Respiratory: Negative.    Cardiovascular: Negative.   Gastrointestinal:  Positive for abdominal pain. Negative for abdominal distention, anal bleeding, blood in stool, constipation, diarrhea, nausea, rectal pain and vomiting.  Genitourinary: Negative.   Musculoskeletal:  Positive for back pain. Negative for arthralgias, myalgias, neck pain and neck stiffness.  Skin: Negative.   Neurological: Negative.   All other systems reviewed and are  negative.  Physical Exam Updated Vital Signs BP (!) 156/86   Pulse 80   Temp 97.9 F (36.6 C)   Resp 17   SpO2 100%   Physical Exam Physical Exam  Constitutional: Pt appears well-developed and well-nourished. No distress.  HENT:  Head: Normocephalic and atraumatic.  Mouth/Throat: Oropharynx is clear and moist. No oropharyngeal exudate.  Eyes: Conjunctivae are normal.  Neck: Normal range of motion. Neck supple.  Full ROM without pain  Cardiovascular: Normal rate, regular rhythm and intact distal pulses.  2+ DP, PT pulses Bl. Pulmonary/Chest: Effort normal and breath sounds normal. No respiratory distress. Pt has no wheezes.  Abdominal: Soft. Pt exhibits no distension. There is no  rebound or guarding. Tenderness to RLQ, right mid abdomen.No abd bruit or pulsatile mass Musculoskeletal:  Full range of motion of the T-spine and L-spine with flexion, hyperextension, and lateral flexion. No midline tenderness or stepoffs. No tenderness to palpation of the spinous processes of the T-spine or L-spine. Mild tenderness to palpation of the paraspinous muscles of the L-spine. Positive straight leg raise. Tenderness to right piriformis, SI region. LE compartments soft, Able to flex at BL hips, knees. Lymphadenopathy:    Pt has no cervical adenopathy.  Neurological: Pt is alert. Pt has normal reflexes.  Speech is clear and goal oriented, follows commands Normal 5/5 strength in upper and lower extremities bilaterally including dorsiflexion and plantar flexion, strong and equal grip strength Sensation normal to light and sharp touch Moves extremities without ataxia, coordination intact Normal gait Normal balance No Clonus Skin: Skin is warm and dry. No rash noted or lesions noted. Pt is not diaphoretic. No erythema, ecchymosis,edema or warmth.  Psychiatric: Pt has a normal mood and affect. Behavior is normal.  Nursing note and vitals reviewed.  ED Results / Procedures / Treatments    Labs (all labs ordered are listed, but only abnormal results are displayed) Labs Reviewed - No data to display  EKG None  Radiology No results found.  Procedures Procedures   Medications Ordered in ED Medications - No data to display  ED Course  I have  reviewed the triage vital signs and the nursing notes.  Pertinent labs & imaging results that were available during my care of the patient were reviewed by me and considered in my medical decision making (see chart for details).  Here for evaluation abdominal pain and back pain. Afebrile, non septic non ill appearing. Has similar history of chronic back pain, followed by Dr. Lorin Mercy with ortho.  Pick something up at work  with worsening pain last week. Known AAA.  No bowel or bladder incontinence, saddle paresthesia, malignancy, fever, IV drug use.  Reproducible pain on exam to right gluteus, SI, piriformis. NV intact. Does not appear clinically fluid overloaded. Compartments soft. Symptoms likely related to sciatica however given history of AAA, worsening symptoms after picking up something will obtain CTA abdomen.  At this time I have low suspicion for cauda equina.  Labs and imaging pending on care transfer to [place], PA-C. Unfortunately delay in labs. Dispo per oncoming provider.  Patient has been ambulatory here.  Suspect if imaging without significant abnormality DC home with pain medicine, steroids, patient states blood sugars are in the low 100s, muscle relaxers and follow-up with Dr. Lorin Mercy outpatient. Return for new or worsening symptoms.     MDM Rules/Calculators/A&P                            Final Clinical Impression(s) / ED Diagnoses Final diagnoses:  Back pain    Rx / DC Orders ED Discharge Orders     None        Geovannie Vilar A, PA-C 01/28/21 0459    Charlesetta Shanks, MD 01/31/21 0745

## 2021-01-28 ENCOUNTER — Other Ambulatory Visit: Payer: Self-pay

## 2021-02-01 MED FILL — Losartan Potassium Tab 100 MG: ORAL | 30 days supply | Qty: 30 | Fill #5 | Status: AC

## 2021-02-03 ENCOUNTER — Other Ambulatory Visit: Payer: Self-pay

## 2021-02-06 ENCOUNTER — Other Ambulatory Visit: Payer: Self-pay | Admitting: Nurse Practitioner

## 2021-02-06 DIAGNOSIS — E1165 Type 2 diabetes mellitus with hyperglycemia: Secondary | ICD-10-CM

## 2021-02-06 MED FILL — Clopidogrel Bisulfate Tab 75 MG (Base Equiv): ORAL | 30 days supply | Qty: 30 | Fill #5 | Status: AC

## 2021-02-06 NOTE — Telephone Encounter (Signed)
Requested medications are due for refill today yes  Requested medications are on the active medication list yes  Last refill 01/09/21 for Tylenol #3 and 01/08/21 for Amaryl  Last visit 10/11/20  Future visit scheduled No, asked to return in 3 month, Sept, no upcoming appt scheduled.  Notes to clinic Tyl #3 Not Delegated, no return appt as requested, please assess.

## 2021-02-07 ENCOUNTER — Other Ambulatory Visit: Payer: Self-pay

## 2021-02-07 MED ORDER — GLIMEPIRIDE 2 MG PO TABS
2.0000 mg | ORAL_TABLET | Freq: Every day | ORAL | 0 refills | Status: DC
  Filled 2021-02-07: qty 30, 30d supply, fill #0

## 2021-02-08 MED ORDER — ACETAMINOPHEN-CODEINE #3 300-30 MG PO TABS
1.0000 | ORAL_TABLET | Freq: Two times a day (BID) | ORAL | 0 refills | Status: DC | PRN
  Filled 2021-02-08: qty 30, 15d supply, fill #0

## 2021-02-10 ENCOUNTER — Other Ambulatory Visit: Payer: Self-pay

## 2021-02-17 ENCOUNTER — Other Ambulatory Visit: Payer: Self-pay

## 2021-02-19 ENCOUNTER — Other Ambulatory Visit: Payer: Self-pay

## 2021-02-27 ENCOUNTER — Other Ambulatory Visit: Payer: Self-pay

## 2021-02-27 MED FILL — Rosuvastatin Calcium Tab 40 MG: ORAL | 30 days supply | Qty: 30 | Fill #6 | Status: AC

## 2021-02-27 MED FILL — Chlorthalidone Tab 25 MG: ORAL | 30 days supply | Qty: 30 | Fill #6 | Status: AC

## 2021-02-28 ENCOUNTER — Other Ambulatory Visit: Payer: Self-pay

## 2021-03-04 ENCOUNTER — Other Ambulatory Visit: Payer: Self-pay | Admitting: Nurse Practitioner

## 2021-03-04 ENCOUNTER — Other Ambulatory Visit: Payer: Self-pay

## 2021-03-04 MED FILL — Losartan Potassium Tab 100 MG: ORAL | 30 days supply | Qty: 30 | Fill #6 | Status: AC

## 2021-03-04 NOTE — Telephone Encounter (Signed)
Requested medications are due for refill today.  yes  Requested medications are on the active medications list.  yes  Last refill. 02/08/2021  Future visit scheduled.   no  Notes to clinic.  Medication not delegated.

## 2021-03-05 ENCOUNTER — Other Ambulatory Visit: Payer: Self-pay

## 2021-03-10 ENCOUNTER — Other Ambulatory Visit: Payer: Self-pay

## 2021-03-11 ENCOUNTER — Other Ambulatory Visit: Payer: Self-pay

## 2021-03-12 ENCOUNTER — Other Ambulatory Visit: Payer: Self-pay | Admitting: Family Medicine

## 2021-03-12 ENCOUNTER — Other Ambulatory Visit: Payer: Self-pay

## 2021-03-12 ENCOUNTER — Other Ambulatory Visit: Payer: Self-pay | Admitting: Nurse Practitioner

## 2021-03-12 DIAGNOSIS — E1165 Type 2 diabetes mellitus with hyperglycemia: Secondary | ICD-10-CM

## 2021-03-12 MED FILL — Clopidogrel Bisulfate Tab 75 MG (Base Equiv): ORAL | 30 days supply | Qty: 30 | Fill #6 | Status: AC

## 2021-03-13 ENCOUNTER — Other Ambulatory Visit: Payer: Self-pay

## 2021-03-13 MED ORDER — EMPAGLIFLOZIN 25 MG PO TABS
ORAL_TABLET | ORAL | 1 refills | Status: DC
  Filled 2021-03-13: qty 60, 60d supply, fill #0

## 2021-03-13 NOTE — Telephone Encounter (Signed)
Requested medications are due for refill today.  yes  Requested medications are on the active medications list.  yes  Last refill. 10/13/2020  Future visit scheduled.   no  Notes to clinic.  Medication not delegated.

## 2021-03-13 NOTE — Telephone Encounter (Signed)
Requested Prescriptions  Pending Prescriptions Disp Refills  . empagliflozin (JARDIANCE) 25 MG TABS tablet 30 tablet 2    Sig: TAKE 1 TABLET (25 MG TOTAL) BY MOUTH DAILY BEFORE BREAKFAST.     Endocrinology:  Diabetes - SGLT2 Inhibitors Failed - 03/12/2021  3:08 PM      Failed - HBA1C is between 0 and 7.9 and within 180 days    HbA1c, POC (controlled diabetic range)  Date Value Ref Range Status  10/11/2020 8.8 (A) 0.0 - 7.0 % Final         Passed - Cr in normal range and within 360 days    Creat  Date Value Ref Range Status  12/27/2015 0.75 0.70 - 1.33 mg/dL Final    Comment:      For patients > or = 60 years of age: The upper reference limit for Creatinine is approximately 13% higher for people identified as African-American.      Creatinine, Ser  Date Value Ref Range Status  01/27/2021 1.11 0.61 - 1.24 mg/dL Final   Creatinine, Urine  Date Value Ref Range Status  11/18/2015 390 (H) 20 - 370 mg/dL Final    Comment:    Result confirmed by automatic dilution. Result repeated and verified.          Passed - LDL in normal range and within 360 days    LDL Chol Calc (NIH)  Date Value Ref Range Status  12/03/2020 46 0 - 99 mg/dL Final         Passed - eGFR in normal range and within 360 days    GFR calc Af Amer  Date Value Ref Range Status  05/24/2020 84 >59 mL/min/1.73 Final    Comment:    **In accordance with recommendations from the NKF-ASN Task force,**   [facility] is in the process of updating its eGFR calculation to the   2021 CKD-EPI creatinine equation that estimates kidney function   without a race variable.    GFR, Estimated  Date Value Ref Range Status  01/27/2021 >60 >60 mL/min Final    Comment:    (NOTE) Calculated using the CKD-EPI Creatinine Equation (2021)    GFR  Date Value Ref Range Status  11/26/2014 102.54 >60.00 mL/min Final         Passed - Valid encounter within last 6 months    Recent Outpatient Visits          5 months ago Type 2  diabetes mellitus with hyperglycemia, without long-term current use of insulin [facility])   [place], [place] W, NP   8 months ago Type 2 diabetes mellitus with hyperglycemia, without long-term current use of insulin [facility])   [place], Vernia Buff, NP   9 months ago Encounter to establish care   [place], [place] W, NP   3 years ago Type 2 diabetes mellitus without complication, without long-term current use of insulin [facility])   [place], Vernia Buff, NP   3 years ago Essential hypertension   [place], [place], [place]             . Vitamin D, Ergocalciferol, (DRISDOL) 1.25 MG (50000 UNIT) CAPS capsule 12 capsule 1    Sig: Take 1 capsule (50,000 Units total) by mouth every 7 (seven) days.     Endocrinology:  Vitamins - Vitamin D Supplementation Failed -  03/12/2021  3:08 PM      Failed - 50,000 IU strengths are not delegated      Failed - Phosphate in normal range and within 360 days    No results found for: PHOS       Failed - Vitamin D in normal range and within 360 days    Vit D, 25-Hydroxy  Date Value Ref Range Status  10/11/2020 6.2 (L) 30.0 - 100.0 ng/mL Final    Comment:    Vitamin D deficiency has been defined by the Institute of Medicine and an Endocrine Society practice guideline as a level of serum 25-OH vitamin D less than 20 ng/mL (1,2). The Endocrine Society went on to further define vitamin D insufficiency as a level between 21 and 29 ng/mL (2). 1. IOM (Institute of Medicine). 2010. Dietary reference    intakes for calcium and D. [place]: The    [employer]. 2. Holick MF, Binkley [place], Bischoff-Ferrari HA, et al.    Evaluation, treatment, and prevention of vitamin D    deficiency: an Endocrine Society clinical practice    guideline. JCEM. 2011 Jul;  96(7):1911-30.          Passed - Ca in normal range and within 360 days    Calcium  Date Value Ref Range Status  01/27/2021 9.3 8.9 - 10.3 mg/dL Final         Passed - Valid encounter within last 12 months    Recent Outpatient Visits          5 months ago Type 2 diabetes mellitus with hyperglycemia, without long-term current use of insulin [facility])   [place], [place] W, NP   8 months ago Type 2 diabetes mellitus with hyperglycemia, without long-term current use of insulin [facility])   [place], Vernia Buff, NP   9 months ago Encounter to establish care   [place], [place] W, NP   3 years ago Type 2 diabetes mellitus without complication, without long-term current use of insulin [facility] [place])   [place], Vernia Buff, NP   3 years ago Essential hypertension   [place], [place], [place]

## 2021-03-13 NOTE — Telephone Encounter (Signed)
Requested medications are due for refill today.  yes  Requested medications are on the active medications list.  yes  Last refill. 02/07/2021  Future visit scheduled.   no  Notes to clinic.  Pt already given a courtesy refill.

## 2021-03-14 ENCOUNTER — Other Ambulatory Visit (HOSPITAL_COMMUNITY): Payer: Self-pay

## 2021-03-14 ENCOUNTER — Other Ambulatory Visit: Payer: Self-pay

## 2021-03-14 MED ORDER — GLIMEPIRIDE 2 MG PO TABS
2.0000 mg | ORAL_TABLET | Freq: Every day | ORAL | 0 refills | Status: DC
  Filled 2021-03-14: qty 30, 30d supply, fill #0

## 2021-03-14 MED ORDER — VITAMIN D (ERGOCALCIFEROL) 1.25 MG (50000 UNIT) PO CAPS
50000.0000 [IU] | ORAL_CAPSULE | ORAL | 0 refills | Status: DC
  Filled 2021-03-14: qty 4, 28d supply, fill #0

## 2021-03-15 MED ORDER — ACETAMINOPHEN-CODEINE #3 300-30 MG PO TABS
1.0000 | ORAL_TABLET | Freq: Two times a day (BID) | ORAL | 0 refills | Status: DC | PRN
  Filled 2021-03-15: qty 30, 15d supply, fill #0

## 2021-03-16 ENCOUNTER — Other Ambulatory Visit: Payer: Self-pay | Admitting: Nurse Practitioner

## 2021-03-16 DIAGNOSIS — G8929 Other chronic pain: Secondary | ICD-10-CM

## 2021-03-16 DIAGNOSIS — M5441 Lumbago with sciatica, right side: Secondary | ICD-10-CM

## 2021-03-16 NOTE — Telephone Encounter (Signed)
Requested medication (s) are due for refill today: yes  Requested medication (s) are on the active medication list: yes  Last refill:  01/23/21  Future visit scheduled: no  Notes to clinic:  Prescription end 03/24/21   Requested Prescriptions  Pending Prescriptions Disp Refills   methocarbamol (ROBAXIN-750) 750 MG tablet 60 tablet 1    Sig: Take 1 tablet (750 mg total) by mouth in the morning and at bedtime.     Not Delegated - Analgesics:  Muscle Relaxants Failed - 03/16/2021  2:17 PM      Failed - This refill cannot be delegated      Passed - Valid encounter within last 6 months    Recent Outpatient Visits           5 months ago Type 2 diabetes mellitus with hyperglycemia, without long-term current use of insulin [facility])   [place] [facility] [place], [place] W, NP   8 months ago Type 2 diabetes mellitus with hyperglycemia, without long-term current use of insulin [facility])   [place] [facility] Well[facility] [place], Shea Stakes, NP   9 months ago Encounter to establish care   [facility] And Wellness [place], [place] W, NP   3 years ago Type 2 diabetes mellitus without complication, without long-term current use of insulin [facility])   [place] [facility] And Well[facility] [place], Shea Stakes, NP   3 years ago Essential hypertension   [facility] And Wellness [place], Oren Beckmann, [place]

## 2021-03-17 ENCOUNTER — Other Ambulatory Visit: Payer: Self-pay

## 2021-03-18 ENCOUNTER — Other Ambulatory Visit: Payer: Self-pay

## 2021-03-24 ENCOUNTER — Other Ambulatory Visit: Payer: Self-pay

## 2021-03-31 ENCOUNTER — Other Ambulatory Visit: Payer: Self-pay | Admitting: Internal Medicine

## 2021-03-31 ENCOUNTER — Other Ambulatory Visit: Payer: Self-pay

## 2021-03-31 MED FILL — Rosuvastatin Calcium Tab 40 MG: ORAL | 30 days supply | Qty: 30 | Fill #7 | Status: AC

## 2021-04-01 ENCOUNTER — Other Ambulatory Visit: Payer: Self-pay

## 2021-04-01 MED ORDER — CHLORTHALIDONE 25 MG PO TABS
25.0000 mg | ORAL_TABLET | Freq: Every day | ORAL | 0 refills | Status: DC
  Filled 2021-04-01: qty 30, 30d supply, fill #0

## 2021-04-02 ENCOUNTER — Other Ambulatory Visit: Payer: Self-pay

## 2021-04-03 ENCOUNTER — Other Ambulatory Visit: Payer: Self-pay | Admitting: Nurse Practitioner

## 2021-04-03 MED FILL — Losartan Potassium Tab 100 MG: ORAL | 30 days supply | Qty: 30 | Fill #7 | Status: AC

## 2021-04-04 NOTE — Telephone Encounter (Signed)
Requested medication (s) are due for refill today: Yes  Requested medication (s) are on the active medication list: Yes  Last refill:  03/15/21  Future visit scheduled: No  Notes to clinic:  See request.    Requested Prescriptions  Pending Prescriptions Disp Refills   acetaminophen-codeine (TYLENOL #3) 300-30 MG tablet 30 tablet 0    Sig: Take 1 tablet by mouth every 12 (twelve) hours as needed for moderate pain.     Not Delegated - Analgesics:  Opioid Agonist Combinations Failed - 04/03/2021 11:00 AM      Failed - This refill cannot be delegated      Failed - Urine Drug Screen completed in last 360 days      Passed - Valid encounter within last 6 months    Recent Outpatient Visits           5 months ago Type 2 diabetes mellitus with hyperglycemia, without long-term current use of insulin [facility])   [place] [facility] And Wellness [place], [place] W, NP   9 months ago Type 2 diabetes mellitus with hyperglycemia, without long-term current use of insulin [facility])   [place] [facility] And Wellness [place], Shea Stakes, NP   10 months ago Encounter to establish care   [facility] And Wellness [place], [place] W, NP   3 years ago Type 2 diabetes mellitus without complication, without long-term current use of insulin [facility])   [place] [facility] And Wellness [place], Shea Stakes, NP   3 years ago Essential hypertension   [facility] And Wellness [place], O[place] Beck[place], [place]

## 2021-04-07 ENCOUNTER — Other Ambulatory Visit: Payer: Self-pay

## 2021-04-07 MED ORDER — ACETAMINOPHEN-CODEINE #3 300-30 MG PO TABS
1.0000 | ORAL_TABLET | Freq: Two times a day (BID) | ORAL | 0 refills | Status: DC | PRN
  Filled 2021-04-07 – 2021-04-16 (×3): qty 30, 15d supply, fill #0

## 2021-04-07 NOTE — Telephone Encounter (Signed)
DISPENSE 04-17-2021

## 2021-04-08 ENCOUNTER — Other Ambulatory Visit: Payer: Self-pay

## 2021-04-10 ENCOUNTER — Other Ambulatory Visit: Payer: Self-pay

## 2021-04-14 ENCOUNTER — Other Ambulatory Visit: Payer: Self-pay | Admitting: Family Medicine

## 2021-04-14 ENCOUNTER — Other Ambulatory Visit: Payer: Self-pay

## 2021-04-14 DIAGNOSIS — E1165 Type 2 diabetes mellitus with hyperglycemia: Secondary | ICD-10-CM

## 2021-04-14 MED FILL — Clopidogrel Bisulfate Tab 75 MG (Base Equiv): ORAL | 30 days supply | Qty: 30 | Fill #7 | Status: AC

## 2021-04-14 NOTE — Telephone Encounter (Signed)
Requested medication (s) are due for refill today: yes  Requested medication (s) are on the active medication list: yes  Last refill:  03/18/21  Future visit scheduled: NO  Notes to clinic:  Has already had a curtesy refill and there is no upcoming appointment scheduled.   Requested Prescriptions  Pending Prescriptions Disp Refills   glimepiride (AMARYL) 2 MG tablet 30 tablet 0    Sig: Take 1 tablet (2 mg total) by mouth daily before breakfast.     Endocrinology:  Diabetes - Sulfonylureas Failed - 04/14/2021  1:30 PM      Failed - HBA1C is between 0 and 7.9 and within 180 days    HbA1c, POC (controlled diabetic range)  Date Value Ref Range Status  10/11/2020 8.8 (A) 0.0 - 7.0 % Final          Failed - Valid encounter within last 6 months    Recent Outpatient Visits           6 months ago Type 2 diabetes mellitus with hyperglycemia, without long-term current use of insulin [facility])   [place] [facility] And Wellness [place], [place] W, NP   9 months ago Type 2 diabetes mellitus with hyperglycemia, without long-term current use of insulin [facility])   [place] [facility] And Wellness [place], Shea Stakes, NP   10 months ago Encounter to establish care   [facility] And Wellness [place], [place] W, NP   3 years ago Type 2 diabetes mellitus without complication, without long-term current use of insulin (HCC)   [place] [facility] And Wellness [place], [place] W, NP   3 years ago Essential hypertension   [place] [address] And Wellness [place], [place] R, FNP               Vitamin D, Ergocalciferol, (DRISDOL) 1.25 MG (50000 UNIT) CAPS capsule 4 capsule 0    Sig: Take 1 capsule (50,000 Units total) by mouth every 7 (seven) days.     Endocrinology:  Vitamins - Vitamin D Supplementation Failed - 04/14/2021  1:30 PM      Failed - 50,000 IU strengths are not delegated      Failed - Phosphate in normal range and within 360 days    No  results found for: PHOS        Failed - Vitamin D in normal range and within 360 days    Vit D, 25-Hydroxy  Date Value Ref Range Status  10/11/2020 6.2 (L) 30.0 - 100.0 ng/mL Final    Comment:    Vitamin D deficiency has been defined by the Institute of Medicine and an Endocrine Society practice guideline as a level of serum 25-OH vitamin D less than 20 ng/mL (1,2). The Endocrine Society went on to further define vitamin D insufficiency as a level between 21 and 29 ng/mL (2). 1. IOM (Institute of Medicine). 2010. Dietary reference    intakes for calcium and D. [place]: The    [employer]. 2. Holick MF, Binkley [place], Bischoff-Ferrari HA, et al.    Evaluation, treatment, and prevention of vitamin D    deficiency: an Endocrine Society clinical practice    guideline. JCEM. 2011 Jul; 96(7):1911-30.           Passed - Ca in normal range and within 360 days    Calcium  Date Value Ref Range Status  01/27/2021 9.3 8.9 - 10.3 mg/dL Final          Passed -  Valid encounter within last 12 months    Recent Outpatient Visits           6 months ago Type 2 diabetes mellitus with hyperglycemia, without long-term current use of insulin [facility])   [place] [facility] And Wellness [place], [place] W, NP   9 months ago Type 2 diabetes mellitus with hyperglycemia, without long-term current use of insulin [facility])   [place] [facility] And Wellness [place], Shea Stakes, NP   10 months ago Encounter to establish care   [facility] And Wellness [place], [place] W, NP   3 years ago Type 2 diabetes mellitus without complication, without long-term current use of insulin [facility])   [place] [facility] And Wellness [place], Shea Stakes, NP   3 years ago Essential hypertension   [facility] And Wellness [place], O[place] Beck[place], [place]

## 2021-04-15 ENCOUNTER — Other Ambulatory Visit: Payer: Self-pay

## 2021-04-16 ENCOUNTER — Other Ambulatory Visit: Payer: Self-pay

## 2021-04-17 ENCOUNTER — Other Ambulatory Visit: Payer: Self-pay

## 2021-04-24 ENCOUNTER — Encounter: Payer: Self-pay | Admitting: Family Medicine

## 2021-04-24 ENCOUNTER — Other Ambulatory Visit: Payer: Self-pay

## 2021-04-24 ENCOUNTER — Ambulatory Visit: Payer: Self-pay | Attending: Family Medicine | Admitting: Family Medicine

## 2021-04-24 VITALS — BP 140/82 | HR 77 | Ht 75.0 in | Wt 294.6 lb

## 2021-04-24 DIAGNOSIS — I2510 Atherosclerotic heart disease of native coronary artery without angina pectoris: Secondary | ICD-10-CM

## 2021-04-24 DIAGNOSIS — L603 Nail dystrophy: Secondary | ICD-10-CM

## 2021-04-24 DIAGNOSIS — E1165 Type 2 diabetes mellitus with hyperglycemia: Secondary | ICD-10-CM

## 2021-04-24 DIAGNOSIS — M519 Unspecified thoracic, thoracolumbar and lumbosacral intervertebral disc disorder: Secondary | ICD-10-CM

## 2021-04-24 DIAGNOSIS — I10 Essential (primary) hypertension: Secondary | ICD-10-CM

## 2021-04-24 LAB — POCT GLYCOSYLATED HEMOGLOBIN (HGB A1C): HbA1c, POC (controlled diabetic range): 7.3 % — AB (ref 0.0–7.0)

## 2021-04-24 LAB — GLUCOSE, POCT (MANUAL RESULT ENTRY): POC Glucose: 152 mg/dl — AB (ref 70–99)

## 2021-04-24 MED ORDER — ACETAMINOPHEN-CODEINE #3 300-30 MG PO TABS
1.0000 | ORAL_TABLET | Freq: Two times a day (BID) | ORAL | 0 refills | Status: DC | PRN
  Filled 2021-04-24 – 2021-04-30 (×2): qty 30, 15d supply, fill #0

## 2021-04-24 MED ORDER — TIZANIDINE HCL 4 MG PO TABS
4.0000 mg | ORAL_TABLET | Freq: Three times a day (TID) | ORAL | 1 refills | Status: DC | PRN
  Filled 2021-04-24: qty 60, 20d supply, fill #0
  Filled 2021-05-20: qty 60, 20d supply, fill #1
  Filled 2021-05-20: qty 60, 20d supply, fill #0

## 2021-04-24 MED ORDER — GLIMEPIRIDE 2 MG PO TABS
2.0000 mg | ORAL_TABLET | Freq: Every day | ORAL | 1 refills | Status: DC
  Filled 2021-04-24: qty 30, 30d supply, fill #0
  Filled 2021-05-18: qty 30, 30d supply, fill #1
  Filled 2021-05-19: qty 30, 30d supply, fill #0
  Filled 2021-06-14: qty 30, 30d supply, fill #1
  Filled 2021-07-15: qty 30, 30d supply, fill #2
  Filled 2021-08-12: qty 30, 30d supply, fill #3
  Filled 2021-09-11: qty 30, 30d supply, fill #4

## 2021-04-24 MED ORDER — LIDOCAINE 5 % EX PTCH
1.0000 | MEDICATED_PATCH | CUTANEOUS | 0 refills | Status: AC
  Filled 2021-04-24: qty 30, 30d supply, fill #0

## 2021-04-24 MED ORDER — EMPAGLIFLOZIN 25 MG PO TABS
ORAL_TABLET | ORAL | 1 refills | Status: DC
  Filled 2021-04-24: qty 90, fill #0
  Filled 2021-05-11: qty 30, 30d supply, fill #0
  Filled 2021-05-12 – 2021-08-11 (×2): qty 90, 90d supply, fill #0

## 2021-04-24 MED ORDER — METFORMIN HCL 500 MG PO TABS
500.0000 mg | ORAL_TABLET | Freq: Two times a day (BID) | ORAL | 1 refills | Status: DC
  Filled 2021-04-24: qty 180, 90d supply, fill #0
  Filled 2021-05-11 – 2021-06-10 (×3): qty 60, 30d supply, fill #0
  Filled 2021-06-10 – 2021-07-10 (×2): qty 60, 30d supply, fill #1
  Filled 2021-08-12: qty 60, 30d supply, fill #2
  Filled 2021-09-11: qty 60, 30d supply, fill #3
  Filled 2021-10-09: qty 60, 30d supply, fill #4

## 2021-04-24 MED ORDER — GABAPENTIN 300 MG PO CAPS
300.0000 mg | ORAL_CAPSULE | Freq: Every day | ORAL | 3 refills | Status: DC
Start: 2021-04-24 — End: 2021-08-31
  Filled 2021-04-24: qty 30, 30d supply, fill #0
  Filled 2021-05-20: qty 30, 30d supply, fill #1
  Filled 2021-05-20: qty 30, 30d supply, fill #0
  Filled 2021-06-14 – 2021-06-20 (×2): qty 30, 30d supply, fill #1
  Filled 2021-07-21: qty 30, 30d supply, fill #2

## 2021-04-24 NOTE — Progress Notes (Signed)
Lower back pain.  Medication refills.

## 2021-04-24 NOTE — Patient Instructions (Signed)

## 2021-04-24 NOTE — Progress Notes (Signed)
Subjective:  Patient ID: William Young, male    DOB: [birth year]  Age: 60 y.o. MRN: [id number]  CC: Diabetes   HPI William Young is a 60 y.o. year old male patient of Geryl Rankins, FNP with a history of type 2 diabetes mellitus (A1c 7.3), hypertension, NSTEMI status post bare-metal stent to proximal RCA and DES to RPLB3, chronic low back pain.  Interval History: He presents today complaining of low back pain; he was seen at [facility] ED in 01/2021 for same.  His orthopedic is Dr. Lorin Mercy who had informed patient that he needs to undergo surgery but is required to have a lower A1c prior to procedure.  He has pain from buttock down his legs daily and he had to quit his job at [employer] due to difficulty with prolonged standing but he has a sitdown job with [employer].  He is unable to bend to put on his shoes or his socks; using the bathroom causes his pain to worsen and prolonged sitting or standing worsen his pain.  He does have some numbness in his lower extremities.  His A1c is 7.3 which has improved from 8.8 previously.  He endorses compliance with his diabetes medications.  He has no hypoglycemia or visual concerns. He has a male friend who accompanied him today and complains his feet are discolored. He has not been to see his cardiologist in a while and epic notes and some of his medications indicates he needs to call to schedule an appointment prior to additional refills. Past Medical History:  Diagnosis Date   Abdominal aortic aneurysm (AAA)    CT 1/22: infrarenal 3.8 cm - repeat US in 2 years // AAA [place] 2/22: AAA 3.8 cm; aorto iliac atherosclerosis without hemodynamically significant stenosis   Aortic atherosclerosis ([place])    CT in 03/2020   CAD (coronary artery disease) 09/2014   a. NSTEMI >> LHC 5/16:  mLAD 15, pRCA 90, mRCA 40, RPLB3 80, EF normal with inf HK >> PCI:  BMS to pRCA and DES to RPLB3 // Cath 11/21: Patent RCA stent, patent RPL stent; medical therapy     GERD (gastroesophageal reflux disease)    History of echocardiogram    Echo 6/16:  EF 50-55%, no RWMA, Gr 1 DD   HLD (hyperlipidemia)    Archie Endo 03/15/2017   HTN (hypertension)    Hx of NSTEMI 5/16 tx with BMS to pRCA and DES to RPLB3 09/10/2014   Hyperglycemia    OSA (obstructive sleep apnea)    "dx'd; couldn't tolerate mask" (03/15/2017)   PAD (peripheral artery disease) ([place])    AAA [place] in 06/2020: aorto iliac atherosclerosis   Tobacco use    Type II diabetes mellitus ([place])    Varicose veins of both lower extremities    S/P ablation 02/2017    Past Surgical History:  Procedure Laterality Date   CARDIAC CATHETERIZATION N/A 09/11/2014   Procedure: Left Heart Cath and Coronary Angiography;  Surgeon: Leonie Man, MD;  Location: [facility] INVASIVE CV LAB CUP[facility];  Service: Cardiovascular;  Laterality: N/A;   CARDIAC CATHETERIZATION  09/11/2014   Procedure: Coronary Stent Intervention;  Surgeon: Leonie Man, MD;  Location: [facility] INVASIVE CV LAB CUPID;  Service: Cardiovascular;;   ENDOVENOUS ABLATION SAPHENOUS VEIN W/ LASER Right 02/22/2017   endovenous laser ablation R GSV and stab phlebectomy >20 incisions R leg by Tinnie Gens MD    LEFT HEART CATH AND CORONARY ANGIOGRAPHY N/A 04/01/2020   Procedure: LEFT HEART  CATH AND CORONARY ANGIOGRAPHY;  Surgeon: Lorretta Harp, MD;  Location: [facility] CV LAB;  Service: Cardiovascular;  Laterality: N/A;    Family History  Problem Relation Age of Onset   Heart attack Maternal Uncle    Heart attack Paternal Grandmother    Cancer Maternal Grandfather    Heart attack Maternal Grandmother    Stroke Neg Hx    Hypertension Neg Hx     Allergies  Allergen Reactions   Aspirin Anaphylaxis, Swelling and Rash    Throat closes    Ibuprofen Anaphylaxis, Swelling and Rash    Outpatient Medications Prior to Visit  Medication Sig Dispense Refill   Blood Glucose Monitoring Suppl (TRUE METRIX METER) w/Device KIT Use as instructed. Check blood glucose level by  fingerstick twice per day. E11.65 1 kit 0   chlorthalidone (HYGROTON) 25 MG tablet Take 1 tablet (25 mg total) by mouth daily. Please schedule overdue appt. With Dr. Harrington Challenger in order to receive future refills. Thank You. 1st. Attempt. 30 tablet 0   clopidogrel (PLAVIX) 75 MG tablet TAKE 1 TABLET (75 MG TOTAL) BY MOUTH DAILY. 90 tablet 3   ezetimibe (ZETIA) 10 MG tablet Take 1 tablet (10 mg total) by mouth daily. 90 tablet 3   glucose blood test strip USE AS INSTRUCTED TO CHECK BLOOD GLUCOSE LEVEL BY FINGERSTICK TWICE DAILY (Patient taking differently: USE AS INSTRUCTED TO CHECK BLOOD GLUCOSE LEVEL BY FINGERSTICK TWICE DAILY) 100 strip 12   losartan (COZAAR) 100 MG tablet TAKE 1 TABLET (100 MG TOTAL) BY MOUTH DAILY. 90 tablet 3   Menthol, Topical Analgesic, (BIOFREEZE) 4 % GEL Apply 1 application topically once as needed (for pain).     nitroGLYCERIN (NITROSTAT) 0.4 MG SL tablet Place 1 tablet (0.4 mg total) every 5 (five) minutes x 3 doses as needed under the tongue for chest pain. 25 tablet 12   rosuvastatin (CRESTOR) 40 MG tablet TAKE 1 TABLET (40 MG TOTAL) BY MOUTH DAILY. 90 tablet 3   TRUEplus Lancets 28G MISC USE AS INSTRUCTED CHECK BLOOD GLUCOSE LEVEL BY FINGERSTICK TWICE DAILY 100 each 3   Vitamin D, Ergocalciferol, (DRISDOL) 1.25 MG (50000 UNIT) CAPS capsule Take 1 capsule (50,000 Units total) by mouth every 7 (seven) days. 4 capsule 0   acetaminophen-codeine (TYLENOL #3) 300-30 MG tablet Take 1 tablet by mouth every 12 (twelve) hours as needed for moderate pain. DISPENSE 04-17-2021 30 tablet 0   empagliflozin (JARDIANCE) 25 MG TABS tablet TAKE 1 TABLET (25 MG TOTAL) BY MOUTH DAILY BEFORE BREAKFAST. 30 tablet 1   glimepiride (AMARYL) 2 MG tablet Take 1 tablet (2 mg total) by mouth daily before breakfast. 30 tablet 0   metFORMIN (GLUCOPHAGE) 500 MG tablet Take 1 tablet (500 mg total) by mouth 2 (two) times daily with a meal. 180 tablet 1   doxycycline (VIBRAMYCIN) 100 MG capsule Take 1 capsule (100  mg total) by mouth 2 (two) times daily. (Patient not taking: Reported on 04/24/2021) 20 capsule 0   predniSONE (DELTASONE) 10 MG tablet Take by mouth daily. Take 6 tabs by mouth daily  for 2 days, then 5 tabs for 2 days, then 4 tabs for 2 days, then 3 tabs for 2 days, 2 tabs for 2 days, then 1 tab by mouth daily for 2 days (Patient not taking: Reported on 04/24/2021) 42 tablet 0   No facility-administered medications prior to visit.     ROS Review of Systems  Constitutional:  Negative for activity change and appetite change.  HENT:  Negative for sinus pressure and sore throat.   Eyes:  Negative for visual disturbance.  Respiratory:  Negative for cough, chest tightness and shortness of breath.   Cardiovascular:  Negative for chest pain and leg swelling.  Gastrointestinal:  Negative for abdominal distention, abdominal pain, constipation and diarrhea.  Endocrine: Negative.   Genitourinary:  Negative for dysuria.  Musculoskeletal:  Positive for back pain. Negative for joint swelling and myalgias.  Skin:  Negative for rash.  Allergic/Immunologic: Negative.   Neurological:  Negative for weakness, light-headedness and numbness.  Psychiatric/Behavioral:  Negative for dysphoric mood and suicidal ideas.    Objective:  BP 140/82    Pulse 77    Ht 6' 3" (1.905 m)    Wt 294 lb 9.6 oz (133.6 kg)    SpO2 98%    BMI 36.82 kg/m   BP/Weight 04/24/2021 01/27/2021 9/47/6546  Systolic BP 503 546 568  Diastolic BP 82 83 73  Wt. (Lbs) 294.6 - 280  BMI 36.82 - 35      Physical Exam Constitutional:      Appearance: He is well-developed.  Cardiovascular:     Rate and Rhythm: Normal rate.     Heart sounds: Normal heart sounds. No murmur heard. Pulmonary:     Effort: Pulmonary effort is normal.     Breath sounds: Normal breath sounds. No wheezing or rales.  Chest:     Chest wall: No tenderness.  Abdominal:     General: Bowel sounds are normal. There is no distension.     Palpations: Abdomen is  soft. There is no mass.     Tenderness: There is no abdominal tenderness.  Musculoskeletal:        General: Normal range of motion.     Right lower leg: No edema.     Left lower leg: No edema.     Comments: Severe tenderness on palpation of right lateral aspect of sacral spine Positive straight leg raise bilaterally right greater than left  Neurological:     Mental Status: He is alert and oriented to person, place, and time.  Psychiatric:        Mood and Affect: Mood normal.   Diabetic Foot Exam - Simple   Simple Foot Form Diabetic Foot exam was performed with the following findings: Yes 04/24/2021 11:22 AM  Visual Inspection See comments: Yes Sensation Testing Intact to touch and monofilament testing bilaterally: Yes Pulse Check Posterior Tibialis and Dorsalis pulse intact bilaterally: Yes Comments Long thick dystrophic toenails x10 Hyperpigmentation with associated scaling on both legs from ankle down to feet.  Presence of varicose veins in feet     CMP Latest Ref Rng & Units 01/27/2021 12/03/2020 11/12/2020  Glucose 70 - 99 mg/dL 106(H) - 157(H)  BUN 6 - 20 mg/dL 23(H) - 20  Creatinine 0.61 - 1.24 mg/dL 1.11 - 1.36(H)  Sodium 135 - 145 mmol/L 135 - 134(L)  Potassium 3.5 - 5.1 mmol/L 4.2 - 3.9  Chloride 98 - 111 mmol/L 101 - 105  CO2 22 - 32 mmol/L 23 - 21(L)  Calcium 8.9 - 10.3 mg/dL 9.3 - 8.7(L)  Total Protein 6.0 - 8.5 g/dL - 6.6 6.6  Total Bilirubin 0.0 - 1.2 mg/dL - 0.4 1.0  Alkaline Phos 44 - 121 IU/L - 74 62  AST 0 - 40 IU/L - 14 31  ALT 0 - 44 IU/L - 20 33    Lipid Panel     Component Value Date/Time   CHOL 99 (L)  12/03/2020 1059   TRIG 70 12/03/2020 1059   HDL 38 (L) 12/03/2020 1059   CHOLHDL 2.6 12/03/2020 1059   CHOLHDL 6.8 03/16/2017 0439   VLDL 33 03/16/2017 0439   LDLCALC 46 12/03/2020 1059    CBC    Component Value Date/Time   WBC 7.2 01/27/2021 1420   RBC 4.46 01/27/2021 1420   HGB 13.8 01/27/2021 1420   HGB 14.1 03/29/2020 1100   HCT 42.2  01/27/2021 1420   HCT 40.6 03/29/2020 1100   PLT 171 01/27/2021 1420   PLT 176 03/29/2020 1100   MCV 94.6 01/27/2021 1420   MCV 93 03/29/2020 1100   MCH 30.9 01/27/2021 1420   MCHC 32.7 01/27/2021 1420   RDW 14.3 01/27/2021 1420   RDW 12.6 03/29/2020 1100   LYMPHSABS 2.3 01/27/2021 1420   MONOABS 0.5 01/27/2021 1420   EOSABS 0.3 01/27/2021 1420   BASOSABS 0.1 01/27/2021 1420    Lab Results  Component Value Date   HGBA1C 7.3 (A) 04/24/2021    Assessment & Plan:  1. Type 2 diabetes mellitus with hyperglycemia, without long-term current use of insulin (HCC) Commended on improvement with A1c of 7.3 which is down from 8.8 previously Continue current management Counseled on Diabetic diet, my plate method, 937 minutes of moderate intensity exercise/week Blood sugar logs with fasting goals of 80-120 mg/dl, random of less than 180 and in the event of sugars less than 60 mg/dl or greater than 400 mg/dl encouraged to notify the clinic. Advised on the need for annual eye exams, annual foot exams, Pneumonia vaccine. - POCT glucose (manual entry) - POCT glycosylated hemoglobin (Hb A1C) - empagliflozin (JARDIANCE) 25 MG TABS tablet; TAKE 1 TABLET (25 MG TOTAL) BY MOUTH DAILY BEFORE BREAKFAST.  Dispense: 90 tablet; Refill: 1 - metFORMIN (GLUCOPHAGE) 500 MG tablet; Take 1 tablet (500 mg total) by mouth 2 (two) times daily with a meal.  Dispense: 180 tablet; Refill: 1 - glimepiride (AMARYL) 2 MG tablet; Take 1 tablet (2 mg total) by mouth daily before breakfast.  Dispense: 90 tablet; Refill: 1  2. Dystrophic nail - Ambulatory referral to Podiatry  3. Lumbar disc disease Uncontrolled He needs to have spine surgery but his orthopedics Dr. Lorin Mercy.  A1c is now at goal and he has been advised he can discuss this with his surgeon Refill Tylenol 3 which she previously received from PCP I have added gabapentin which he will take at night due to sedation and also added a muscle relaxant and Lidoderm  patches Advised to apply heat or ice whichever is tolerated to painful areas. Counseled on evidence of improvement in pain control with regards to yoga, water aerobics, massage, home physical therapy, exercise as tolerated.  - acetaminophen-codeine (TYLENOL #3) 300-30 MG tablet; Take 1 tablet by mouth every 12 (twelve) hours as needed for moderate pain. DISPENSE 04-17-2021  Dispense: 30 tablet; Refill: 0 - gabapentin (NEURONTIN) 300 MG capsule; Take 1 capsule (300 mg total) by mouth at bedtime.  Dispense: 30 capsule; Refill: 3 - tiZANidine (ZANAFLEX) 4 MG tablet; Take 1 tablet (4 mg total) by mouth every 8 (eight) hours as needed for muscle spasms.  Dispense: 60 tablet; Refill: 1 - lidocaine (LIDODERM) 5 %; Place 1 patch onto the skin daily. Remove & Discard patch within 12 hours or as directed by MD  Dispense: 30 patch; Refill: 0  4. Essential hypertension Controlled Continue current antihypertensive Counseled on blood pressure goal of less than 130/80, low-sodium, DASH diet, medication compliance, 150 minutes of  moderate intensity exercise per week. Discussed medication compliance, adverse effects.  5. Coronary artery disease involving native coronary artery of native heart without angina pectoris Status post PCI Risk factor modification Continue high intensity statin Advised to follow-up with cardiology   Meds ordered this encounter  Medications   acetaminophen-codeine (TYLENOL #3) 300-30 MG tablet    Sig: Take 1 tablet by mouth every 12 (twelve) hours as needed for moderate pain. DISPENSE 04-17-2021    Dispense:  30 tablet    Refill:  0    DISPENSE 04-17-2021   gabapentin (NEURONTIN) 300 MG capsule    Sig: Take 1 capsule (300 mg total) by mouth at bedtime.    Dispense:  30 capsule    Refill:  3   tiZANidine (ZANAFLEX) 4 MG tablet    Sig: Take 1 tablet (4 mg total) by mouth every 8 (eight) hours as needed for muscle spasms.    Dispense:  60 tablet    Refill:  1   lidocaine  (LIDODERM) 5 %    Sig: Place 1 patch onto the skin daily. Remove & Discard patch within 12 hours or as directed by MD    Dispense:  30 patch    Refill:  0   empagliflozin (JARDIANCE) 25 MG TABS tablet    Sig: TAKE 1 TABLET (25 MG TOTAL) BY MOUTH DAILY BEFORE BREAKFAST.    Dispense:  90 tablet    Refill:  1   metFORMIN (GLUCOPHAGE) 500 MG tablet    Sig: Take 1 tablet (500 mg total) by mouth 2 (two) times daily with a meal.    Dispense:  180 tablet    Refill:  1   glimepiride (AMARYL) 2 MG tablet    Sig: Take 1 tablet (2 mg total) by mouth daily before breakfast.    Dispense:  90 tablet    Refill:  1    Must have office visit for refills     Follow-up: Return in about 3 months (around 07/23/2021) for Medical conditions with PCP.       Charlott Rakes, MD, FAAFP. [facility] and [place], [place]   04/24/2021, 12:10 PM

## 2021-04-30 ENCOUNTER — Other Ambulatory Visit: Payer: Self-pay

## 2021-04-30 ENCOUNTER — Other Ambulatory Visit: Payer: Self-pay | Admitting: Internal Medicine

## 2021-04-30 MED ORDER — CHLORTHALIDONE 25 MG PO TABS
25.0000 mg | ORAL_TABLET | Freq: Every day | ORAL | 0 refills | Status: DC
  Filled 2021-04-30: qty 30, 30d supply, fill #0

## 2021-04-30 MED FILL — Rosuvastatin Calcium Tab 40 MG: ORAL | 30 days supply | Qty: 30 | Fill #8 | Status: AC

## 2021-05-01 ENCOUNTER — Other Ambulatory Visit: Payer: Self-pay

## 2021-05-06 ENCOUNTER — Other Ambulatory Visit: Payer: Self-pay | Admitting: Family Medicine

## 2021-05-06 ENCOUNTER — Other Ambulatory Visit: Payer: Self-pay

## 2021-05-06 MED FILL — Losartan Potassium Tab 100 MG: ORAL | 30 days supply | Qty: 30 | Fill #8 | Status: AC

## 2021-05-07 ENCOUNTER — Other Ambulatory Visit: Payer: Self-pay

## 2021-05-08 ENCOUNTER — Ambulatory Visit: Payer: Self-pay | Admitting: Podiatry

## 2021-05-09 ENCOUNTER — Other Ambulatory Visit: Payer: Self-pay

## 2021-05-12 ENCOUNTER — Other Ambulatory Visit: Payer: Self-pay

## 2021-05-13 ENCOUNTER — Other Ambulatory Visit: Payer: Self-pay

## 2021-05-18 ENCOUNTER — Other Ambulatory Visit: Payer: Self-pay | Admitting: Family Medicine

## 2021-05-18 DIAGNOSIS — M519 Unspecified thoracic, thoracolumbar and lumbosacral intervertebral disc disorder: Secondary | ICD-10-CM

## 2021-05-18 MED FILL — Clopidogrel Bisulfate Tab 75 MG (Base Equiv): ORAL | 30 days supply | Qty: 30 | Fill #8 | Status: CN

## 2021-05-19 ENCOUNTER — Other Ambulatory Visit: Payer: Self-pay

## 2021-05-19 MED FILL — Clopidogrel Bisulfate Tab 75 MG (Base Equiv): ORAL | 30 days supply | Qty: 30 | Fill #0 | Status: AC

## 2021-05-19 NOTE — Telephone Encounter (Signed)
Requested medication (s) are due for refill today: Yes  Requested medication (s) are on the active medication list: Yes  Last refill:  04/24/21  Future visit scheduled: No  Notes to clinic:  See request.    Requested Prescriptions  Pending Prescriptions Disp Refills   acetaminophen-codeine (TYLENOL #3) 300-30 MG tablet 30 tablet 0    Sig: Take 1 tablet by mouth every 12 (twelve) hours as needed for moderate pain. DISPENSE 04-17-2021     Not Delegated - Analgesics:  Opioid Agonist Combinations Failed - 05/18/2021 10:39 PM      Failed - This refill cannot be delegated      Failed - Urine Drug Screen completed in last 360 days      Passed - Valid encounter within last 6 months    Recent Outpatient Visits           3 weeks ago Type 2 diabetes mellitus with hyperglycemia, without long-term current use of insulin (HCC)   [facility] [place], [place], MD   7 months ago Type 2 diabetes mellitus with hyperglycemia, without long-term current use of insulin [facility])   [facility] [place], [place] W, NP   10 months ago Type 2 diabetes mellitus with hyperglycemia, without long-term current use of insulin [facility])   [facility] [place], Shea Stakes, NP   12 months ago Encounter to establish care   [facility] [place], [place] W, NP   3 years ago Type 2 diabetes mellitus without complication, without long-term current use of insulin [facility])   [facility] [place], Shea Stakes, NP

## 2021-05-20 ENCOUNTER — Other Ambulatory Visit: Payer: Self-pay

## 2021-05-21 ENCOUNTER — Other Ambulatory Visit: Payer: Self-pay

## 2021-05-21 ENCOUNTER — Other Ambulatory Visit: Payer: Self-pay | Admitting: Nurse Practitioner

## 2021-05-21 DIAGNOSIS — M519 Unspecified thoracic, thoracolumbar and lumbosacral intervertebral disc disorder: Secondary | ICD-10-CM

## 2021-05-21 MED ORDER — ACETAMINOPHEN-CODEINE #3 300-30 MG PO TABS
1.0000 | ORAL_TABLET | Freq: Two times a day (BID) | ORAL | 0 refills | Status: DC | PRN
  Filled 2021-05-21 – 2021-06-03 (×2): qty 60, 30d supply, fill #0

## 2021-05-26 ENCOUNTER — Other Ambulatory Visit: Payer: Self-pay

## 2021-05-26 ENCOUNTER — Ambulatory Visit (INDEPENDENT_AMBULATORY_CARE_PROVIDER_SITE_OTHER)
Admission: RE | Admit: 2021-05-26 | Discharge: 2021-05-26 | Disposition: A | Discharge status: Home / Self Care | Payer: Self-pay | Source: Ambulatory Visit | Attending: Emergency Medicine | Admitting: Emergency Medicine

## 2021-05-26 DIAGNOSIS — R918 Other nonspecific abnormal finding of lung field: Secondary | ICD-10-CM

## 2021-05-27 ENCOUNTER — Other Ambulatory Visit: Payer: Self-pay | Admitting: Internal Medicine

## 2021-05-27 ENCOUNTER — Other Ambulatory Visit: Payer: Self-pay | Admitting: Physician Assistant

## 2021-05-28 ENCOUNTER — Other Ambulatory Visit: Payer: Self-pay

## 2021-05-28 MED ORDER — ROSUVASTATIN CALCIUM 40 MG PO TABS
40.0000 mg | ORAL_TABLET | Freq: Every day | ORAL | 0 refills | Status: DC
  Filled 2021-05-28 (×2): qty 30, 30d supply, fill #0

## 2021-05-28 MED ORDER — CHLORTHALIDONE 25 MG PO TABS
25.0000 mg | ORAL_TABLET | Freq: Every day | ORAL | 0 refills | Status: DC
  Filled 2021-05-28 (×2): qty 15, 15d supply, fill #0

## 2021-05-29 ENCOUNTER — Other Ambulatory Visit: Payer: Self-pay

## 2021-06-03 MED FILL — Losartan Potassium Tab 100 MG: ORAL | 30 days supply | Qty: 30 | Fill #9 | Status: CN

## 2021-06-04 ENCOUNTER — Other Ambulatory Visit: Payer: Self-pay

## 2021-06-04 MED FILL — Losartan Potassium Tab 100 MG: ORAL | 30 days supply | Qty: 30 | Fill #0 | Status: AC

## 2021-06-10 ENCOUNTER — Other Ambulatory Visit: Payer: Self-pay | Admitting: Internal Medicine

## 2021-06-10 ENCOUNTER — Other Ambulatory Visit: Payer: Self-pay

## 2021-06-10 MED ORDER — CHLORTHALIDONE 25 MG PO TABS
25.0000 mg | ORAL_TABLET | Freq: Every day | ORAL | 0 refills | Status: DC
  Filled 2021-06-10: qty 15, 15d supply, fill #0

## 2021-06-11 ENCOUNTER — Other Ambulatory Visit: Payer: Self-pay

## 2021-06-14 ENCOUNTER — Other Ambulatory Visit: Payer: Self-pay | Admitting: Physician Assistant

## 2021-06-16 ENCOUNTER — Other Ambulatory Visit: Payer: Self-pay

## 2021-06-16 MED ORDER — CLOPIDOGREL BISULFATE 75 MG PO TABS
75.0000 mg | ORAL_TABLET | Freq: Every day | ORAL | 0 refills | Status: DC
  Filled 2021-06-16: qty 30, 30d supply, fill #0

## 2021-06-17 ENCOUNTER — Other Ambulatory Visit: Payer: Self-pay

## 2021-06-19 ENCOUNTER — Other Ambulatory Visit: Payer: Self-pay

## 2021-06-20 ENCOUNTER — Other Ambulatory Visit: Payer: Self-pay

## 2021-06-20 ENCOUNTER — Other Ambulatory Visit: Payer: Self-pay | Admitting: Family Medicine

## 2021-06-20 DIAGNOSIS — M519 Unspecified thoracic, thoracolumbar and lumbosacral intervertebral disc disorder: Secondary | ICD-10-CM

## 2021-06-20 MED ORDER — TIZANIDINE HCL 4 MG PO TABS
4.0000 mg | ORAL_TABLET | Freq: Three times a day (TID) | ORAL | 1 refills | Status: DC | PRN
  Filled 2021-06-20: qty 60, 20d supply, fill #0
  Filled 2021-07-21: qty 60, 20d supply, fill #1

## 2021-06-20 NOTE — Telephone Encounter (Signed)
Requested medication (s) are due for refill today: yes  Requested medication (s) are on the active medication list: yes  Last refill:  04/24/21 #60/  Future visit scheduled: no  Notes to clinic:  Unable to refill per protocol, cannot delegate.      Requested Prescriptions  Pending Prescriptions Disp Refills   tiZANidine (ZANAFLEX) 4 MG tablet 60 tablet 1    Sig: Take 1 tablet (4 mg total) by mouth every 8 (eight) hours as needed for muscle spasms.     Not Delegated - Cardiovascular:  Alpha-2 Agonists - tizanidine Failed - 06/20/2021 10:12 AM      Failed - This refill cannot be delegated      Passed - Valid encounter within last 6 months    Recent Outpatient Visits           1 month ago Type 2 diabetes mellitus with hyperglycemia, without long-term current use of insulin (HCC)   [facility] [place], [place], MD   8 months ago Type 2 diabetes mellitus with hyperglycemia, without long-term current use of insulin [facility])   [facility] [place], [place] W, NP   11 months ago Type 2 diabetes mellitus with hyperglycemia, without long-term current use of insulin [facility])   [facility] [place], Shea Stakes, NP   1 year ago Encounter to establish care   [facility] [place], [place] W, NP   3 years ago Type 2 diabetes mellitus without complication, without long-term current use of insulin [facility])   [facility] [place], Shea Stakes, NP

## 2021-06-24 ENCOUNTER — Other Ambulatory Visit (HOSPITAL_COMMUNITY): Payer: Self-pay | Admitting: Physician Assistant

## 2021-06-24 DIAGNOSIS — I714 Abdominal aortic aneurysm, without rupture, unspecified: Secondary | ICD-10-CM

## 2021-06-29 ENCOUNTER — Other Ambulatory Visit: Payer: Self-pay | Admitting: Internal Medicine

## 2021-06-29 ENCOUNTER — Other Ambulatory Visit: Payer: Self-pay | Admitting: Physician Assistant

## 2021-07-01 ENCOUNTER — Ambulatory Visit (HOSPITAL_COMMUNITY): Payer: Self-pay

## 2021-07-01 ENCOUNTER — Other Ambulatory Visit: Payer: Self-pay

## 2021-07-02 ENCOUNTER — Other Ambulatory Visit: Payer: Self-pay

## 2021-07-06 ENCOUNTER — Other Ambulatory Visit: Payer: Self-pay | Admitting: Internal Medicine

## 2021-07-07 ENCOUNTER — Other Ambulatory Visit: Payer: Self-pay

## 2021-07-07 MED ORDER — LOSARTAN POTASSIUM 100 MG PO TABS
ORAL_TABLET | Freq: Every day | ORAL | 0 refills | Status: DC
  Filled 2021-07-07: qty 30, 30d supply, fill #0
  Filled 2021-08-05: qty 30, 30d supply, fill #1
  Filled 2021-08-31: qty 30, 30d supply, fill #2

## 2021-07-08 ENCOUNTER — Other Ambulatory Visit: Payer: Self-pay

## 2021-07-11 ENCOUNTER — Other Ambulatory Visit: Payer: Self-pay

## 2021-07-15 ENCOUNTER — Other Ambulatory Visit: Payer: Self-pay

## 2021-07-15 ENCOUNTER — Other Ambulatory Visit: Payer: Self-pay | Admitting: Internal Medicine

## 2021-07-15 MED ORDER — CLOPIDOGREL BISULFATE 75 MG PO TABS
75.0000 mg | ORAL_TABLET | Freq: Every day | ORAL | 1 refills | Status: DC
  Filled 2021-07-15: qty 30, 30d supply, fill #0
  Filled 2021-08-12: qty 30, 30d supply, fill #1

## 2021-07-16 ENCOUNTER — Other Ambulatory Visit: Payer: Self-pay

## 2021-07-17 ENCOUNTER — Other Ambulatory Visit: Payer: Self-pay

## 2021-07-17 ENCOUNTER — Ambulatory Visit (HOSPITAL_COMMUNITY)
Admission: RE | Admit: 2021-07-17 | Discharge: 2021-07-17 | Disposition: A | Discharge status: Home / Self Care | Payer: Self-pay | Source: Ambulatory Visit | Attending: Internal Medicine | Admitting: Internal Medicine

## 2021-07-17 DIAGNOSIS — I714 Abdominal aortic aneurysm, without rupture, unspecified: Secondary | ICD-10-CM | POA: Insufficient documentation

## 2021-07-18 ENCOUNTER — Telehealth: Payer: Self-pay | Admitting: Internal Medicine

## 2021-07-18 DIAGNOSIS — M79604 Pain in right leg: Secondary | ICD-10-CM

## 2021-07-18 NOTE — Telephone Encounter (Signed)
Beatrice Lecher, PA-C  ?07/18/2021  1:56 PM EST   ?  ?Abdominal US demonstrates stable AAA at 3.8 cm ?There is evidence of aortoiliac atherosclerosis without significant stenosis ?PLAN:  ?-Continue current medications ?-Repeat AAA ultrasound in 1 year ?-Ask patient if he has pain in his legs when he walks.  If yes arrange ABIs with arterial Dopplers ?[name], PA-C    ?07/18/2021 1:54 PM    ? ? ?Pt aware of results and that testing will need to be repeated in 1 year (AAA). ?Pt does admit to having bilaterally leg pain from buttocks down to ankles.  He describes this pain as sharp and it occurs after walking approximately 25 feet.  Orders placed for ABIs with LEAs per [name], PA.  Pt aware he will be contacted to be scheduled for this teting which will be completed at the [facility] office.   ?

## 2021-07-18 NOTE — Telephone Encounter (Signed)
Pt returning phone call for results  

## 2021-07-21 ENCOUNTER — Other Ambulatory Visit: Payer: Self-pay

## 2021-07-21 ENCOUNTER — Other Ambulatory Visit: Payer: Self-pay | Admitting: Nurse Practitioner

## 2021-07-21 DIAGNOSIS — M519 Unspecified thoracic, thoracolumbar and lumbosacral intervertebral disc disorder: Secondary | ICD-10-CM

## 2021-07-22 NOTE — Telephone Encounter (Signed)
Requested medications are due for refill today.  yes ? ?Requested medications are on the active medications list.  no ? ?Last refill. 05/21/2021 #60 0 refills ? ?Future visit scheduled.   no ? ?Notes to clinic.  Medication not on med list. Medication not delegated. ? ? ? ?Requested Prescriptions  ?Pending Prescriptions Disp Refills  ? acetaminophen-codeine (TYLENOL #3) 300-30 MG tablet 60 tablet 0  ?  Sig: Take 1 tablet by mouth every 12 (twelve) hours as needed for moderate pain. DISPENSE 06-02-2021  ?  ? Not Delegated - Analgesics:  Opioid Agonist Combinations 2 Failed - 07/21/2021  1:11 PM  ?  ?  Failed - This refill cannot be delegated  ?  ?  Failed - Urine Drug Screen completed in last 360 days  ?  ?  Passed - Cr in normal range and within 360 days  ?  Creat  ?Date Value Ref Range Status  ?12/27/2015 0.75 0.70 - 1.33 mg/dL Final  ?  Comment:  ?    ?For patients > or = 61 years of age: The upper reference limit for ?Creatinine is approximately 13% higher for people identified as ?African-American. ?  ?  ? ?Creatinine, Ser  ?Date Value Ref Range Status  ?01/27/2021 1.11 0.61 - 1.24 mg/dL Final  ? ?Creatinine, Urine  ?Date Value Ref Range Status  ?11/18/2015 390 (H) 20 - 370 mg/dL Final  ?  Comment:  ?  Result confirmed by automatic dilution. ?Result repeated and verified. ?  ?  ?  ?  ?  Passed - eGFR is 10 or above and within 360 days  ?  GFR calc Af Amer  ?Date Value Ref Range Status  ?05/24/2020 84 >59 mL/min/1.73 Final  ?  Comment:  ?  **In accordance with recommendations from the NKF-ASN Task force,** ?  [facility] is in the process of updating its eGFR calculation to the ?  2021 CKD-EPI creatinine equation that estimates kidney function ?  without a race variable. ?  ? ?GFR, Estimated  ?Date Value Ref Range Status  ?01/27/2021 >60 >60 mL/min Final  ?  Comment:  ?  (NOTE) ?Calculated using the CKD-EPI Creatinine Equation (2021) ?  ? ?GFR  ?Date Value Ref Range Status  ?11/26/2014 102.54 >60.00 mL/min Final  ?  ?  ?   ?  Passed - Patient is not pregnant  ?  ?  Passed - Valid encounter within last 3 months  ?  Recent Outpatient Visits   ? ?      ? 2 months ago Type 2 diabetes mellitus with hyperglycemia, without long-term current use of insulin ([place])  ? [place], Charlane Ferretti, MD  ? 9 months ago Type 2 diabetes mellitus with hyperglycemia, without long-term current use of insulin ([place])  ? [place], [place] W, NP  ? 1 year ago Type 2 diabetes mellitus with hyperglycemia, without long-term current use of insulin ([place])  ? [place], Vernia Buff, NP  ? 1 year ago Encounter to establish care  ? [place], [place] W, NP  ? 3 years ago Type 2 diabetes mellitus without complication, without long-term current use of insulin ([place])  ? [place] Gildardo Pounds, NP  ? ?  ?  ?Future Appointments   ? ?        ? In 1 month Richardson Dopp T, [place]  [place] Office, [facility]  ? ?  ? ?  ?  ?  ?  ?

## 2021-07-24 ENCOUNTER — Other Ambulatory Visit: Payer: Self-pay

## 2021-07-25 ENCOUNTER — Other Ambulatory Visit: Payer: Self-pay

## 2021-07-28 ENCOUNTER — Other Ambulatory Visit: Payer: Self-pay | Admitting: Nurse Practitioner

## 2021-07-28 ENCOUNTER — Other Ambulatory Visit: Payer: Self-pay

## 2021-07-28 DIAGNOSIS — M519 Unspecified thoracic, thoracolumbar and lumbosacral intervertebral disc disorder: Secondary | ICD-10-CM

## 2021-07-31 ENCOUNTER — Other Ambulatory Visit: Payer: Self-pay

## 2021-08-05 ENCOUNTER — Other Ambulatory Visit: Payer: Self-pay

## 2021-08-05 ENCOUNTER — Other Ambulatory Visit: Payer: Self-pay | Admitting: Nurse Practitioner

## 2021-08-05 DIAGNOSIS — M519 Unspecified thoracic, thoracolumbar and lumbosacral intervertebral disc disorder: Secondary | ICD-10-CM

## 2021-08-06 ENCOUNTER — Other Ambulatory Visit: Payer: Self-pay

## 2021-08-07 ENCOUNTER — Other Ambulatory Visit: Payer: Self-pay

## 2021-08-08 ENCOUNTER — Other Ambulatory Visit: Payer: Self-pay

## 2021-08-11 ENCOUNTER — Other Ambulatory Visit: Payer: Self-pay | Admitting: Nurse Practitioner

## 2021-08-11 ENCOUNTER — Other Ambulatory Visit: Payer: Self-pay

## 2021-08-11 DIAGNOSIS — M519 Unspecified thoracic, thoracolumbar and lumbosacral intervertebral disc disorder: Secondary | ICD-10-CM

## 2021-08-12 ENCOUNTER — Other Ambulatory Visit: Payer: Self-pay

## 2021-08-12 MED ORDER — ACETAMINOPHEN-CODEINE #3 300-30 MG PO TABS
1.0000 | ORAL_TABLET | Freq: Two times a day (BID) | ORAL | 0 refills | Status: AC | PRN
  Filled 2021-08-12 – 2021-08-13 (×2): qty 60, 30d supply, fill #0

## 2021-08-12 NOTE — Telephone Encounter (Signed)
Requested medications are due for refill today.  yes ? ?Requested medications are on the active medications list.  no ? ?Last refill. 05/21/2021 #60 0 refills ? ?Future visit scheduled.   no ? ?Notes to clinic.  Medication refill is not delegated. ? ? ? ?Requested Prescriptions  ?Pending Prescriptions Disp Refills  ? acetaminophen-codeine (TYLENOL #3) 300-30 MG tablet 60 tablet 0  ?  Sig: Take 1 tablet by mouth every 12 (twelve) hours as needed for moderate pain. DISPENSE 06-02-2021  ?  ? Not Delegated - Analgesics:  Opioid Agonist Combinations 2 Failed - 08/11/2021 11:08 AM  ?  ?  Failed - This refill cannot be delegated  ?  ?  Failed - Urine Drug Screen completed in last 360 days  ?  ?  Failed - Valid encounter within last 3 months  ?  Recent Outpatient Visits   ? ?      ? 3 months ago Type 2 diabetes mellitus with hyperglycemia, without long-term current use of insulin ([place])  ? [place], Charlane Ferretti, MD  ? 10 months ago Type 2 diabetes mellitus with hyperglycemia, without long-term current use of insulin ([place])  ? [place], [place] W, NP  ? 1 year ago Type 2 diabetes mellitus with hyperglycemia, without long-term current use of insulin ([place])  ? [place], Vernia Buff, NP  ? 1 year ago Encounter to establish care  ? [place], [place] W, NP  ? 4 years ago Type 2 diabetes mellitus without complication, without long-term current use of insulin ([place])  ? [place] Gildardo Pounds, NP  ? ?  ?  ?Future Appointments   ? ?        ? In 3 weeks Liliane Shi, PA-C [place] [facility], [facility]  ? ?  ? ?  ?  ?  Passed - Cr in normal range and within 360 days  ?  Creat  ?Date Value Ref Range Status  ?12/27/2015 0.75 0.70 - 1.33 mg/dL Final  ?  Comment:  ?    ?For patients > or = 61 years of age: The upper reference limit  for ?Creatinine is approximately 13% higher for people identified as ?African-American. ?  ?  ? ?Creatinine, Ser  ?Date Value Ref Range Status  ?01/27/2021 1.11 0.61 - 1.24 mg/dL Final  ? ?Creatinine, Urine  ?Date Value Ref Range Status  ?11/18/2015 390 (H) 20 - 370 mg/dL Final  ?  Comment:  ?  Result confirmed by automatic dilution. ?Result repeated and verified. ?  ?  ?  ?  ?  Passed - eGFR is 10 or above and within 360 days  ?  GFR calc Af Amer  ?Date Value Ref Range Status  ?05/24/2020 84 >59 mL/min/1.73 Final  ?  Comment:  ?  **In accordance with recommendations from the NKF-ASN Task force,** ?  [facility] is in the process of updating its eGFR calculation to the ?  2021 CKD-EPI creatinine equation that estimates kidney function ?  without a race variable. ?  ? ?GFR, Estimated  ?Date Value Ref Range Status  ?01/27/2021 >60 >60 mL/min Final  ?  Comment:  ?  (NOTE) ?Calculated using the CKD-EPI Creatinine Equation (2021) ?  ? ?GFR  ?Date Value Ref Range Status  ?11/26/2014 102.54 >60.00 mL/min Final  ?  ?  ?  ?  Passed - Patient is not pregnant  ?  ?  ?  ?

## 2021-08-12 NOTE — Telephone Encounter (Signed)
Called pt - LMOM to return call for appointment. 

## 2021-08-13 ENCOUNTER — Other Ambulatory Visit: Payer: Self-pay

## 2021-08-14 ENCOUNTER — Other Ambulatory Visit: Payer: Self-pay

## 2021-08-15 ENCOUNTER — Ambulatory Visit (HOSPITAL_COMMUNITY)
Admission: RE | Admit: 2021-08-15 | Discharge: 2021-08-15 | Disposition: A | Discharge status: Home / Self Care | Payer: Self-pay | Source: Ambulatory Visit | Attending: Cardiology | Admitting: Cardiology

## 2021-08-15 ENCOUNTER — Other Ambulatory Visit: Payer: Self-pay | Admitting: Physician Assistant

## 2021-08-15 DIAGNOSIS — I739 Peripheral vascular disease, unspecified: Secondary | ICD-10-CM

## 2021-08-18 ENCOUNTER — Telehealth: Payer: Self-pay | Admitting: *Deleted

## 2021-08-18 DIAGNOSIS — I739 Peripheral vascular disease, unspecified: Secondary | ICD-10-CM

## 2021-08-18 NOTE — Telephone Encounter (Signed)
-----   Message from Beatrice Lecher, PA-C sent at 08/18/2021  7:39 AM EDT ----- ?ABIs mildly decreased.  There is at least mild to mod stenosis peripheral arterial disease noted on [place].  Pt recently reported symptoms of claudication.  ?PLAN:  ?-Refer to [facility] (Dr. Kirke Corin or Dr. Allyson Sabal) ?-Send copy to PCP  ?[name], PA-C    ?08/18/2021 7:35 AM   ?

## 2021-08-22 ENCOUNTER — Other Ambulatory Visit (HOSPITAL_COMMUNITY): Payer: Self-pay | Admitting: Physician Assistant

## 2021-08-22 DIAGNOSIS — I7142 Juxtarenal abdominal aortic aneurysm, without rupture: Secondary | ICD-10-CM

## 2021-08-31 ENCOUNTER — Other Ambulatory Visit: Payer: Self-pay | Admitting: Physician Assistant

## 2021-08-31 ENCOUNTER — Other Ambulatory Visit: Payer: Self-pay | Admitting: Family Medicine

## 2021-08-31 DIAGNOSIS — M519 Unspecified thoracic, thoracolumbar and lumbosacral intervertebral disc disorder: Secondary | ICD-10-CM

## 2021-09-01 ENCOUNTER — Other Ambulatory Visit: Payer: Self-pay

## 2021-09-01 MED ORDER — EZETIMIBE 10 MG PO TABS
10.0000 mg | ORAL_TABLET | Freq: Every day | ORAL | 0 refills | Status: DC
Start: 2021-09-01 — End: 2021-11-30
  Filled 2021-09-01: qty 30, 30d supply, fill #0
  Filled 2021-10-02: qty 30, 30d supply, fill #1
  Filled 2021-11-04: qty 30, 30d supply, fill #2

## 2021-09-02 ENCOUNTER — Other Ambulatory Visit: Payer: Self-pay

## 2021-09-02 MED ORDER — GABAPENTIN 300 MG PO CAPS
300.0000 mg | ORAL_CAPSULE | Freq: Every day | ORAL | 2 refills | Status: DC
  Filled 2021-09-02: qty 30, 30d supply, fill #0
  Filled 2021-10-02: qty 30, 30d supply, fill #1
  Filled 2021-10-28: qty 30, 30d supply, fill #2

## 2021-09-02 NOTE — Progress Notes (Signed)
?Cardiology Office Note:   ? ?Date:  09/03/2021  ? ?ID:  [name], DOB 01/20/1961, MRN [id number] ? ?PCP:  Gildardo Pounds, NP  ?[facility] Providers ?Cardiologist:  Dorris Carnes, MD    ?Referring MD: Gildardo Pounds, NP  ? ?Chief Complaint:  F/u for CAD ?  ? ?Patient Profile: ?Coronary artery disease ?S/p NSTEMI in 5/16 >> PCI: BMS to pRCA; DES to RPLB3 ?Myoview 11/18: no ischemia  ?Echocardiogram 11/18: normal EF, inf-lat HK ?ASA allergy ?Cath 11/21: Patent RCA stent, patent RPL stent; medical therapy ?Aortic atherosclerosis  ?Abdominal aortic aneurysm  ?CT 1/22: infrarenal 3.8 cm - repeat US in 2 years  ?CT 01/2021: 3.7 cm  ?[place] 07/2021: 3.8 cm  ?Peripheral arterial disease  ?Hx of mild Heart failure with preserved ejection fraction  ?Diabetes mellitus  ?Hypertension  ?Hyperlipidemia  ?Tobacco abuse  ?OSA ?Lung nodules ?CT 1/22: RUL 6 mm; RML 3 mm   ?Venous insufficiency ?S/p R GSV laser ablation (Dr. Kellie Simmering) in 2018 ?  ?Prior CV Studies: ?LE Arterial US 08/15/2021 ?Right: Heterogeneous plaque throughout.  ?30-49% stenosis in the proximal SFA distal to ostium, and mid SFA.  ?There is at least a 2 vessel run-off via PTA and ATA.  ?Distal peroneal artery not visualized.  ? ?Left: Heterogeneous plaque throughout.  ?30-49% stenosis in the proximal CFA. 30-49% stenosis in the ostial SFA and mid SFA.  ?Three vessel run-off.  ? ?AAA [place] 07/17/2021 ?AAA 3.8 cm ?  ?Cardiac catheterization 04/01/2020 ?RCA proximal stent patent, mid 76; RPDA 30; RPL stent patent ?LAD normal ?LCx normal ?  ?Echo 04/09/17 ?Mild LVH, EF 55-60, inf-lat HK, Gr 1 DD, mildly dilated asc aorta ?  ?Nuclear stress test 04/06/17 ?Medium size, moderate severity fixed inferolateral perfusion defect, likely attenuation artifact. No reversible ischemia. Moderately dilated LV with severe global hypokinesis. LVEF 38%. This is an intermediate risk study. ?  ?Echo 10/17/14 ?EF 50-55, no RWMA, Gr 1 DD, trivial TR ?  ?LHC/PCI 09/11/14 ?LM: ok ?LAD:  Mid 15% ?LCx:   Ok ?RCA:  prox 90%, mid 40%, RPLB3 80% ?LVEF normal ?PCI:  MultiLink Vision BMS 5.0 mm x 18 mm (5.2 mm) to pRCA;  Resolute DES 2.5 mm x 14 mm (2.8 mm) to RPLB3 ? ? ?History of Present Illness:   ?William Young is a 61 y.o. male with the above problem list.  He was last seen via Telemedicine in Jan 2022. A f/u study for his abdominal aortic aneurysm demonstrated evidence of peripheral arterial disease.  LE arterial dopplers confirms peripheral arterial disease and he has an evaluation with Dr. Gwenlyn Found next month.  He returns for f/u.  He is here with his wife.  He quit working at [employer].  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  He just got off work.  He has been sedentary since his back injury a year ago.  He does not exercise at all. He still smokes.  He has had L sided chest pain over the past month.  He has symptoms in his L neck.  The chest pain lasts a minute or so.  He takes NTG with relief.  He has been short of breath with exertion.  He has not had orthopnea, significant leg edema.  He has not had syncope.   ?   ?Past Medical History:  ?Diagnosis Date  ? Abdominal aortic aneurysm (AAA) ([place])   ? CT 1/22: infrarenal 3.8 cm - repeat US in 2 years //  AAA [place] 2/22: AAA 3.8 cm; aorto iliac atherosclerosis without hemodynamically significant stenosis  ? Aortic atherosclerosis ([place])   ? CT in 03/2020  ? CAD (coronary artery disease) 09/2014  ? a. NSTEMI >> LHC 5/16:  mLAD 15, pRCA 90, mRCA 40, RPLB3 80, EF normal with inf HK >> PCI:  BMS to pRCA and DES to RPLB3 // Cath 11/21: Patent RCA stent, patent RPL stent; medical therapy   ? GERD (gastroesophageal reflux disease)   ? History of echocardiogram   ? Echo 6/16:  EF 50-55%, no RWMA, Gr 1 DD  ? HLD (hyperlipidemia)   ? Archie Endo 03/15/2017  ? HTN (hypertension)   ? Hx of NSTEMI 5/16 tx with [facility]MS to [facility] and DES to [facility] 09/10/2014  ? Hyperglycemia   ? OSA (obstructive sleep apnea)   ? "dx'd; couldn't tolerate mask" (03/15/2017)  ? PAD  (peripheral artery disease) ([place])   ? AAA [place] in 06/2020: aorto iliac atherosclerosis  ? Tobacco use   ? Type II diabetes mellitus ([place])   ? Varicose veins of both lower extremities   ? S/P ablation 02/2017  ? ?Current Medications: ?Current Meds  ?Medication Sig  ? acetaminophen-codeine (TYLENOL #3) 300-30 MG tablet Take 1 tablet by mouth every 12 (twelve) hours as needed for moderate pain.  ? Blood Glucose Monitoring Suppl (TRUE METRIX METER) w/Device KIT Use as instructed. Check blood glucose level by fingerstick twice per day. E11.65  ? empagliflozin (JARDIANCE) 25 MG TABS tablet TAKE 1 TABLET (25 MG TOTAL) BY MOUTH DAILY BEFORE BREAKFAST.  ? ezetimibe (ZETIA) 10 MG tablet Take 1 tablet (10 mg total) by mouth daily.  ? gabapentin (NEURONTIN) 300 MG capsule Take 1 capsule (300 mg total) by mouth at bedtime.  ? glimepiride (AMARYL) 2 MG tablet Take 1 tablet (2 mg total) by mouth daily before breakfast.  ? isosorbide mononitrate (IMDUR) 30 MG 24 hr tablet Take 1 tablet (30 mg total) by mouth daily.  ? lidocaine (LIDODERM) 5 % Place 1 patch onto the skin daily. Remove & Discard patch within 12 hours or as directed by MD  ? losartan (COZAAR) 100 MG tablet TAKE 1 TABLET (100 MG TOTAL) BY MOUTH DAILY.  ? metFORMIN (GLUCOPHAGE) 500 MG tablet Take 1 tablet (500 mg total) by mouth 2 (two) times daily with a meal.  ? rosuvastatin (CRESTOR) 40 MG tablet Take 1 tablet (40 mg total) by mouth daily. (Pt. Needs to schedule appt. With Cardiologist in order to receive further refills. Thank You. 1st Attempt.)  ? tiZANidine (ZANAFLEX) 4 MG tablet Take 1 tablet (4 mg total) by mouth every 8 (eight) hours as needed for muscle spasms.  ? Vitamin D, Ergocalciferol, (DRISDOL) 1.25 MG (50000 UNIT) CAPS capsule Take 1 capsule (50,000 Units total) by mouth every 7 (seven) days.  ? [DISCONTINUED] chlorthalidone (HYGROTON) 25 MG tablet Take 1 tablet (25 mg total) by mouth daily. (Please schedule yearly appt with Dr. Harrington Challenger for January 2023 for  future refills.  FINAL ATTEMPT [phone]  ? [DISCONTINUED] clopidogrel (PLAVIX) 75 MG tablet Take 1 tablet (75 mg total) by mouth daily. Please keep upcoming appt in April 2023 with Cardiologist before anymore refills. Thank you Final attempt  ? [DISCONTINUED] Menthol, Topical Analgesic, (BIOFREEZE) 4 % GEL Apply 1 application topically once as needed (for pain).  ? [DISCONTINUED] nitroGLYCERIN (NITROSTAT) 0.4 MG SL tablet Place 1 tablet (0.4 mg total) every 5 (five) minutes x 3 doses as needed under the tongue for chest pain.  ? [  DISCONTINUED] predniSONE (DELTASONE) 10 MG tablet Take by mouth daily. Take 6 tabs by mouth daily  for 2 days, then 5 tabs for 2 days, then 4 tabs for 2 days, then 3 tabs for 2 days, 2 tabs for 2 days, then 1 tab by mouth daily for 2 days  ?  ?Allergies:   Aspirin and Ibuprofen  ? ?Social History  ? ?Tobacco Use  ? Smoking status: Every Day  ?  Packs/day: 1.50  ?  Years: 40.00  ?  Pack years: 60.00  ?  Types: Cigarettes  ?  Start date: 19  ? Smokeless tobacco: Former  ?  Types: Chew  ? Tobacco comments:  ?  currently smoking 0.5ppd as of 07/12/20  ?Vaping Use  ? Vaping Use: Never used  ?Substance Use Topics  ? Alcohol use: Yes  ?  Alcohol/week: 6.0 standard drinks  ?  Types: 6 Cans of beer per week  ? Drug use: No  ?  ?Family Hx: ?The patient's family history includes Cancer in his maternal grandfather; Heart attack in his maternal grandmother, maternal uncle, and paternal grandmother. There is no history of Stroke or Hypertension. ? ?Review of Systems  ?Constitutional: Negative for fever.  ?Respiratory:  Positive for cough.   ?Gastrointestinal:  Negative for diarrhea, hematochezia and melena.  ?Genitourinary:  Negative for hematuria.   ? ?EKGs/Labs/Other Test Reviewed:   ? ?EKG:  EKG is   ordered today.  The ekg ordered today demonstrates normal sinus rhythm, HR 76, LAD, PAC, no ST-TW changes, QTc 447 ? ?Recent Labs: ?12/03/2020: ALT 20 ?01/27/2021: BUN 23; Creatinine, Ser 1.11;  Hemoglobin 13.8; Platelets 171; Potassium 4.2; Sodium 135  ? ?Recent Lipid Panel ?Recent Labs  ?  12/03/20 ?1059  ?CHOL 99*  ?TRIG 70  ?HDL 38*  ?[place] 46  ?  ? ?Risk Assessment/Calculations:   ?  ?    ?Physical Ex

## 2021-09-02 NOTE — Telephone Encounter (Signed)
Requested Prescriptions  ?Pending Prescriptions Disp Refills  ?? gabapentin (NEURONTIN) 300 MG capsule 30 capsule 3  ?  Sig: Take 1 capsule (300 mg total) by mouth at bedtime.  ?  ? Neurology: Anticonvulsants - gabapentin Passed - 08/31/2021 11:18 PM  ?  ?  Passed - Cr in normal range and within 360 days  ?  Creat  ?Date Value Ref Range Status  ?12/27/2015 0.75 0.70 - 1.33 mg/dL Final  ?  Comment:  ?    ?For patients > or = 61 years of age: The upper reference limit for ?Creatinine is approximately 13% higher for people identified as ?African-American. ?  ?  ? ?Creatinine, Ser  ?Date Value Ref Range Status  ?01/27/2021 1.11 0.61 - 1.24 mg/dL Final  ? ?Creatinine, Urine  ?Date Value Ref Range Status  ?11/18/2015 390 (H) 20 - 370 mg/dL Final  ?  Comment:  ?  Result confirmed by automatic dilution. ?Result repeated and verified. ?  ?   ?  ?  Passed - Completed PHQ-2 or PHQ-9 in the last 360 days  ?  ?  Passed - Valid encounter within last 12 months  ?  Recent Outpatient Visits   ?      ? 4 months ago Type 2 diabetes mellitus with hyperglycemia, without long-term current use of insulin (HCC)  ? [place] [facility] [place], Odette Horns, MD  ? 10 months ago Type 2 diabetes mellitus with hyperglycemia, without long-term current use of insulin (HCC)  ? [facility] [place], [place] W, NP  ? 1 year ago Type 2 diabetes mellitus with hyperglycemia, without long-term current use of insulin (HCC)  ? [facility] [place], Shea Stakes, NP  ? 1 year ago Encounter to establish care  ? [facility] [place], [place] W, NP  ? 4 years ago Type 2 diabetes mellitus without complication, without long-term current use of insulin (HCC)  ? [facility] [place] Rigg, NP  ?  ?  ?Future Appointments   ?        ? Tomorrow Beatrice Lecher, PA-C [facility] [address] Office, [facility]  ? In 1 month Allyson Sabal, Delton See,  MD [facility] [place], [place]  ?  ? ?  ?  ?  ? ? ?

## 2021-09-03 ENCOUNTER — Other Ambulatory Visit: Payer: Self-pay

## 2021-09-03 ENCOUNTER — Ambulatory Visit (INDEPENDENT_AMBULATORY_CARE_PROVIDER_SITE_OTHER): Payer: Self-pay | Admitting: Physician Assistant

## 2021-09-03 ENCOUNTER — Encounter: Payer: Self-pay | Admitting: Physician Assistant

## 2021-09-03 VITALS — BP 160/90 | HR 76 | Ht 75.0 in | Wt 309.6 lb

## 2021-09-03 DIAGNOSIS — R0602 Shortness of breath: Secondary | ICD-10-CM

## 2021-09-03 DIAGNOSIS — Z720 Tobacco use: Secondary | ICD-10-CM

## 2021-09-03 DIAGNOSIS — I10 Essential (primary) hypertension: Secondary | ICD-10-CM

## 2021-09-03 DIAGNOSIS — I714 Abdominal aortic aneurysm, without rupture, unspecified: Secondary | ICD-10-CM

## 2021-09-03 DIAGNOSIS — I700 Atherosclerosis of aorta: Secondary | ICD-10-CM

## 2021-09-03 DIAGNOSIS — E785 Hyperlipidemia, unspecified: Secondary | ICD-10-CM

## 2021-09-03 DIAGNOSIS — I25118 Atherosclerotic heart disease of native coronary artery with other forms of angina pectoris: Secondary | ICD-10-CM

## 2021-09-03 DIAGNOSIS — I739 Peripheral vascular disease, unspecified: Secondary | ICD-10-CM

## 2021-09-03 DIAGNOSIS — I2510 Atherosclerotic heart disease of native coronary artery without angina pectoris: Secondary | ICD-10-CM

## 2021-09-03 MED ORDER — ISOSORBIDE MONONITRATE ER 30 MG PO TB24
30.0000 mg | ORAL_TABLET | Freq: Every day | ORAL | 3 refills | Status: DC
  Filled 2021-09-03: qty 30, 30d supply, fill #0
  Filled 2021-10-02: qty 30, 30d supply, fill #1
  Filled 2021-10-31: qty 30, 30d supply, fill #2
  Filled 2021-11-30: qty 30, 30d supply, fill #3
  Filled 2021-12-28: qty 30, 30d supply, fill #4
  Filled 2022-02-01: qty 30, 30d supply, fill #5
  Filled 2022-03-01: qty 30, 30d supply, fill #6
  Filled 2022-04-02: qty 30, 30d supply, fill #7

## 2021-09-03 MED ORDER — CHLORTHALIDONE 25 MG PO TABS
25.0000 mg | ORAL_TABLET | Freq: Every day | ORAL | 3 refills | Status: DC
  Filled 2021-09-03: qty 30, 30d supply, fill #0
  Filled 2021-10-02: qty 30, 30d supply, fill #1
  Filled 2021-10-31: qty 30, 30d supply, fill #2
  Filled 2021-11-30: qty 30, 30d supply, fill #3
  Filled 2021-12-28: qty 30, 30d supply, fill #4
  Filled 2022-02-01: qty 30, 30d supply, fill #5
  Filled 2022-03-01: qty 30, 30d supply, fill #6

## 2021-09-03 MED ORDER — NITROGLYCERIN 0.4 MG SL SUBL
0.4000 mg | SUBLINGUAL_TABLET | SUBLINGUAL | 12 refills | Status: DC | PRN
  Filled 2021-09-03: qty 25, 30d supply, fill #0

## 2021-09-03 MED ORDER — CLOPIDOGREL BISULFATE 75 MG PO TABS
75.0000 mg | ORAL_TABLET | Freq: Every day | ORAL | 3 refills | Status: DC
  Filled 2021-09-03: qty 30, 30d supply, fill #0
  Filled 2021-10-09: qty 30, 30d supply, fill #1
  Filled 2021-11-12: qty 30, 30d supply, fill #2
  Filled 2021-12-08: qty 30, 30d supply, fill #3
  Filled 2022-01-08: qty 30, 30d supply, fill #4
  Filled 2022-02-08 – 2022-02-09 (×2): qty 30, 30d supply, fill #5
  Filled 2022-03-10: qty 30, 30d supply, fill #6
  Filled 2022-04-09: qty 30, 30d supply, fill #7
  Filled 2022-05-08 (×3): qty 30, 30d supply, fill #8
  Filled 2022-06-10: qty 30, 30d supply, fill #9
  Filled 2022-07-09 – 2022-08-10 (×3): qty 30, 30d supply, fill #10

## 2021-09-03 NOTE — Assessment & Plan Note (Signed)
Continue Plavix, statin.  

## 2021-09-03 NOTE — Assessment & Plan Note (Signed)
BP is uncontrolled.  Continue chlorthalidone 25 mg once daily, Losartan 100 mg once daily.  Start Isosorbide MN 30 mg once daily.  Consider beta-blocker at f/u if symptoms continue or BP remains uncontrolled.  Pt will monitor BP at home and send BP readings in 2 weeks.  ?

## 2021-09-03 NOTE — Assessment & Plan Note (Signed)
History of non-STEMI in 2016 treated with a BMS to the RCA and DES to the RPLB3.  Cardiac catheterization in November 2021 demonstrated mild to moderate nonobstructive disease in the RCA and patent stents.  There was no significant disease in LAD or LCx.  For the last month or so, he has noted left-sided chest discomfort.  His symptoms are somewhat concerning for angina.  He continues to smoke.  He does have evidence of emphysema on his chest CT.  He is fairly sedentary.  Electrocardiogram does not demonstrate any acute changes.  He does not take PDE-5 inhibitors.   ?? Obtain Lexiscan Myoview ?? BMET, CBC ?? Start isosorbide mononitrate 30 mg daily ?? Consider adding beta-blocker at follow-up ?? Continue Rosuvastatin 40 mg daily, Plavix 75 mg daily, Zetia 10 mg daily ?? F/u 3-4 weeks ?

## 2021-09-03 NOTE — Assessment & Plan Note (Signed)
He has back issues with radicular symptoms.  He also has evidence of peripheral arterial disease on [place].  It is difficult to know if his leg symptoms are related to his back, peripheral arterial disease or both.  He has an appt with Dr. Allyson Sabal next month for evaluation.  ?

## 2021-09-03 NOTE — Assessment & Plan Note (Signed)
3.8 cm by [place] in 07/2021.  He will need f/u US in 1 year.  ?

## 2021-09-03 NOTE — Assessment & Plan Note (Signed)
LDL optimal in July 2022.  Continue Rosuvastatin 40 mg once daily.  ?

## 2021-09-03 NOTE — Patient Instructions (Signed)
Medication Instructions:  ?Your physician has recommended you make the following change in your medication:  ? START Imdur 30 mg taking 1 daily  ? ?*If you need a refill on your cardiac medications before your next appointment, please call your pharmacy* ? ? ?Lab Work: ?TODAY:  BMET, CBC, & PRO BNP ? ?If you have labs (blood work) drawn today and your tests are completely normal, you will receive your results only by: ?MyChart Message (if you have MyChart) OR ?A paper copy in the mail ?If you have any lab test that is abnormal or we need to change your treatment, we will call you to review the results. ? ? ?Testing/Procedures: ?Your physician has requested that you have an echocardiogram. Echocardiography is a painless test that uses sound waves to create images of your heart. It provides your doctor with information about the size and shape of your heart and how well your heart?s chambers and valves are working. This procedure takes approximately one hour. There are no restrictions for this procedure. ? ?Your physician has requested that you have a lexiscan myoview. For further information please visit [email or web address]. Please follow instruction sheet, BELOW: ? ? ? ?You are scheduled for a Myocardial Perfusion Imaging Study  ?Please arrive 15 minutes prior to your appointment time for registration and insurance purposes. ? ?The test will take approximately 3 to 4 hours to complete; you may bring reading material.  If someone comes with you to your appointment, they will need to remain in the main lobby due to limited space in the testing area. **If you are pregnant or breastfeeding, please notify the nuclear lab prior to your appointment** ? ?How to prepare for your Myocardial Perfusion Test: ?Do not eat or drink 3 hours prior to your test, except you may have water. ?Do not consume products containing caffeine (regular or decaffeinated) 12 hours prior to your test. (ex: coffee, chocolate, sodas, tea). ?Do bring  a list of your current medications with you.  If not listed below, you may take your medications as normal. ?Do wear comfortable clothes (no dresses or overalls) and walking shoes, tennis shoes preferred (No heels or open toe shoes are allowed). ?Do NOT wear cologne, perfume, aftershave, or lotions (deodorant is allowed). ?If these instructions are not followed, your test will have to be rescheduled. ? ? ? ? ?Follow-Up: ?At [facility], you and your health needs are our priority.  As part of our continuing mission to provide you with exceptional heart care, we have created designated Provider Care Teams.  These Care Teams include your primary Cardiologist (physician) and Advanced Practice Providers (APPs -  Physician Assistants and Nurse Practitioners) who all work together to provide you with the care you need, when you need it. ? ?We recommend signing up for the patient portal called "MyChart".  Sign up information is provided on this After Visit Summary.  MyChart is used to connect with patients for Virtual Visits (Telemedicine).  Patients are able to view lab/test results, encounter notes, upcoming appointments, etc.  Non-urgent messages can be sent to your provider as well.   ?To learn more about what you can do with MyChart, go to [email or web address].   ? ?Your next appointment:   ?3-4 WEEKS ? ?The format for your next appointment:   ?In Person ? ?Provider:   ?Dorris Carnes, MD  or Richardson Dopp, PA-C       ? ? ?Other Instructions ? ? ?Important Information About Sugar ? ? ? ? ? ? ?

## 2021-09-03 NOTE — Assessment & Plan Note (Signed)
I have recommended cessation. 

## 2021-09-03 NOTE — Assessment & Plan Note (Signed)
He has been fairly sedentary since his back injury a year ago.  He continues to smoke and has evidence of emphysema on chest CT.  He does not appear volume overloaded on exam.  His last CT in Jan 2023 did demonstrate a dilated pulmonic trunk concerning for pulmonary hypertension.  I suspect his shortness of breath is multifactorial and related to CAD, Chronic Obstructive Pulmonary Disease, deconditioning, obesity.  I will obtain a Myoview as noted to rule out ischemia.  Obtain echocardiogram as well given concern for pulmonary hypertension.  Obtain BMET, BNP.  F/u 3-4 weeks.  ?

## 2021-09-04 LAB — BASIC METABOLIC PANEL
BUN/Creatinine Ratio: 16 (ref 10–24)
BUN: 17 mg/dL (ref 8–27)
CO2: 19 mmol/L — ABNORMAL LOW (ref 20–29)
Calcium: 9.3 mg/dL (ref 8.6–10.2)
Chloride: 105 mmol/L (ref 96–106)
Creatinine, Ser: 1.08 mg/dL (ref 0.76–1.27)
Glucose: 146 mg/dL — ABNORMAL HIGH (ref 70–99)
Potassium: 4.6 mmol/L (ref 3.5–5.2)
Sodium: 141 mmol/L (ref 134–144)
eGFR: 79 mL/min/{1.73_m2} (ref >59)

## 2021-09-04 LAB — PRO B NATRIURETIC PEPTIDE: NT-Pro BNP: 243 pg/mL — ABNORMAL HIGH (ref 0–210)

## 2021-09-04 LAB — CBC
Hematocrit: 44.8 % (ref 37.5–51.0)
Hemoglobin: 15.3 g/dL (ref 13.0–17.7)
MCH: 31.5 pg (ref 26.6–33.0)
MCHC: 34.2 g/dL (ref 31.5–35.7)
MCV: 92 fL (ref 79–97)
Platelets: 188 10*3/uL (ref 150–450)
RBC: 4.86 x10E6/uL (ref 4.14–5.80)
RDW: 13.9 % (ref 11.6–15.4)
WBC: 7.9 10*3/uL (ref 3.4–10.8)

## 2021-09-05 ENCOUNTER — Other Ambulatory Visit: Payer: Self-pay

## 2021-09-12 ENCOUNTER — Other Ambulatory Visit: Payer: Self-pay

## 2021-09-16 ENCOUNTER — Telehealth (HOSPITAL_COMMUNITY): Payer: Self-pay | Admitting: *Deleted

## 2021-09-16 NOTE — Telephone Encounter (Signed)
Left message on voicemail per DPR in reference to upcoming appointment scheduled on 09/22/2021 at 8:15 with detailed instructions given per Myocardial Perfusion Study Information Sheet for the test. LM to arrive 15 minutes early, and that it is imperative to arrive on time for appointment to keep from having the test rescheduled. If you need to cancel or reschedule your appointment, please call the office within 24 hours of your appointment. Failure to do so may result in a cancellation of your appointment, and a $50 no show fee. Phone number given for call back for any questions.  ? ?

## 2021-09-22 ENCOUNTER — Other Ambulatory Visit: Payer: Self-pay

## 2021-09-22 ENCOUNTER — Other Ambulatory Visit: Payer: Self-pay | Admitting: Family Medicine

## 2021-09-22 ENCOUNTER — Ambulatory Visit (HOSPITAL_BASED_OUTPATIENT_CLINIC_OR_DEPARTMENT_OTHER): Payer: Self-pay

## 2021-09-22 ENCOUNTER — Ambulatory Visit (HOSPITAL_COMMUNITY): Payer: Self-pay | Attending: Cardiology

## 2021-09-22 DIAGNOSIS — I700 Atherosclerosis of aorta: Secondary | ICD-10-CM | POA: Insufficient documentation

## 2021-09-22 DIAGNOSIS — R0602 Shortness of breath: Secondary | ICD-10-CM

## 2021-09-22 DIAGNOSIS — E785 Hyperlipidemia, unspecified: Secondary | ICD-10-CM | POA: Insufficient documentation

## 2021-09-22 DIAGNOSIS — M519 Unspecified thoracic, thoracolumbar and lumbosacral intervertebral disc disorder: Secondary | ICD-10-CM

## 2021-09-22 DIAGNOSIS — I10 Essential (primary) hypertension: Secondary | ICD-10-CM

## 2021-09-22 DIAGNOSIS — I25118 Atherosclerotic heart disease of native coronary artery with other forms of angina pectoris: Secondary | ICD-10-CM | POA: Insufficient documentation

## 2021-09-22 DIAGNOSIS — I739 Peripheral vascular disease, unspecified: Secondary | ICD-10-CM | POA: Insufficient documentation

## 2021-09-22 DIAGNOSIS — I714 Abdominal aortic aneurysm, without rupture, unspecified: Secondary | ICD-10-CM | POA: Insufficient documentation

## 2021-09-22 LAB — ECHOCARDIOGRAM COMPLETE
Area-P 1/2: 2.81 cm2
Height: 75 in
S' Lateral: 3.9 cm
Weight: 4944 oz

## 2021-09-22 MED ORDER — REGADENOSON 0.4 MG/5ML IV SOLN
0.4000 mg | Freq: Once | INTRAVENOUS | Status: AC
  Administered 2021-09-22: 0.4 mg via INTRAVENOUS

## 2021-09-22 MED ORDER — TECHNETIUM TC 99M TETROFOSMIN IV KIT
30.4000 | PACK | Freq: Once | INTRAVENOUS | Status: AC | PRN
  Administered 2021-09-22: 30.4 via INTRAVENOUS

## 2021-09-22 MED ORDER — PERFLUTREN LIPID MICROSPHERE
1.0000 mL | INTRAVENOUS | Status: AC | PRN
  Administered 2021-09-22: 1 mL via INTRAVENOUS

## 2021-09-22 MED ORDER — TIZANIDINE HCL 4 MG PO TABS
4.0000 mg | ORAL_TABLET | Freq: Three times a day (TID) | ORAL | 1 refills | Status: DC | PRN
  Filled 2021-09-22: qty 60, 20d supply, fill #0
  Filled 2021-10-31: qty 60, 20d supply, fill #1

## 2021-09-22 NOTE — Telephone Encounter (Signed)
Will forward to provider. Pt doesn't have a future appt schedule  ?

## 2021-09-23 ENCOUNTER — Ambulatory Visit (HOSPITAL_COMMUNITY): Payer: Self-pay | Attending: Cardiovascular Disease

## 2021-09-23 ENCOUNTER — Other Ambulatory Visit: Payer: Self-pay

## 2021-09-23 LAB — MYOCARDIAL PERFUSION IMAGING
LV dias vol: 136 mL (ref 62–150)
LV sys vol: 72 mL
Nuc Stress EF: 47 %
Peak HR: 82 {beats}/min
Rest HR: 74 {beats}/min
Rest Nuclear Isotope Dose: 32.9 mCi
SDS: 1
SRS: 0
SSS: 1
ST Depression (mm): 0 mm
Stress Nuclear Isotope Dose: 30.4 mCi
TID: 1.35

## 2021-09-23 MED ORDER — TECHNETIUM TC 99M TETROFOSMIN IV KIT
32.9000 | PACK | Freq: Once | INTRAVENOUS | Status: AC | PRN
  Administered 2021-09-23: 32.9 via INTRAVENOUS

## 2021-09-23 NOTE — Progress Notes (Signed)
Pt has been made aware of normal result and verbalized understanding.  jw

## 2021-09-29 NOTE — Progress Notes (Unsigned)
Cardiology Office Note:    Date:  09/30/2021   ID:  William Young, DOB November 13, 1960, MRN [id number]  PCP:  William Pounds, NP  [facility] Providers Cardiologist:  William Carnes, MD Cardiology APP:  William Young    Referring MD: William Pounds, NP   Chief Complaint:  Follow-up for CAD    Patient Profile: Coronary artery disease S/p NSTEMI in 5/16 >> PCI: BMS to pRCA; DES to RPLB3 Myoview 11/18: no ischemia  Echocardiogram 11/18: normal EF, inf-lat HK ASA allergy Cath 11/21: Patent RCA stent, patent RPL stent; medical therapy Aortic atherosclerosis  Abdominal aortic aneurysm  CT 1/22: infrarenal 3.8 cm - repeat US in 2 years  CT 01/2021: 3.7 cm  [place] 07/2021: 3.8 cm  Peripheral arterial disease  Hx of mild Heart failure with preserved ejection fraction  Diabetes mellitus  Hypertension  Hyperlipidemia  Tobacco abuse  OSA Lung nodules CT 1/22: RUL 6 mm; RML 3 mm   Venous insufficiency S/p R GSV laser ablation (Dr. Kellie Young) in 2018  Prior CV Studies: GATED SPECT MYO PERF W/LEXISCAN STRESS 1D 09/23/2021 No ischemia or infarction, EF 47, low risk   ECHO COMPLETE WITH IMAGING ENHANCING AGENT 09/22/2021 EF 50-55, no RWMA, normal RVSF, TR signal and adequate for assessing PAP    LE Arterial US 08/15/2021 Right: Heterogeneous plaque throughout.  30-49% stenosis in the proximal SFA distal to ostium, and mid SFA.  There is at least a 2 vessel run-off via PTA and ATA.  Distal peroneal artery not visualized.   Left: Heterogeneous plaque throughout.  30-49% stenosis in the proximal CFA. 30-49% stenosis in the ostial SFA and mid SFA.  Three vessel run-off.    AAA [place] 07/17/2021 AAA 3.8 cm   Cardiac catheterization 04/01/2020 RCA proximal stent patent, mid 28; RPDA 30; RPL stent patent LAD normal LCx normal   Echo 04/09/17 Mild LVH, EF 55-60, inf-lat HK, Gr 1 DD, mildly dilated asc aorta   Nuclear stress test 04/06/17 Medium size, moderate severity fixed  inferolateral perfusion defect, likely attenuation artifact. No reversible ischemia. Moderately dilated LV with severe global hypokinesis. LVEF 38%. This is an intermediate risk study.   Echo 10/17/14 EF 50-55, no RWMA, Gr 1 DD, trivial TR   LHC/PCI 09/11/14 LM: ok LAD:  Mid 15% LCx:  Ok RCA:  prox 90%, mid 40%, RPLB3 80% LVEF normal PCI:  MultiLink Vision BMS 5.0 mm x 18 mm (5.2 mm) to pRCA;  Resolute DES 2.5 mm x 14 mm (2.8 mm) to RPLB3  History of Present Illness:   William Young is a 61 y.o. male with the above problem list.  He was last seen in April 2023.  He noted symptoms of chest pain relieved by SL NTG as well as shortness of breath.  He had decreased activity since a back injury.  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  He still smokes.  I obtained a BNP which was not significantly elevated.  I started him on Isosorbide and obtained a Myoview which showed no ischemia.  An echocardiogram showed low normal EF.  There was a suggestion of pulmonary hypertension on a prior CT but the echocardiogram did not have an adequate TR signal to assess PASP.  He returns for f/u.  He is here alone.  Since last seen, he is feeling much better.  His blood pressure is improved.  He has much less short of breath.  He has not had further chest  pain.  He has not had orthopnea or syncope.  He has mild edema in his legs.    Past Medical History:  Diagnosis Date   Abdominal aortic aneurysm (AAA) ([place])    CT 1/22: infrarenal 3.8 cm - repeat US in 2 years // AAA [place] 2/22: AAA 3.8 cm; aorto iliac atherosclerosis without hemodynamically significant stenosis   Aortic atherosclerosis ([place])    CT in 03/2020   CAD (coronary artery disease) 09/2014   a. NSTEMI >> LHC 5/16:  mLAD 15, pRCA 90, mRCA 40, RPLB3 80, EF normal with inf HK >> PCI:  BMS to pRCA and DES to RPLB3 // Cath 11/21: Patent RCA stent, patent RPL stent; medical therapy    GERD (gastroesophageal reflux disease)    History of  echocardiogram    Echo 6/16:  EF 50-55%, no RWMA, Gr 1 DD   HLD (hyperlipidemia)    William Young 03/15/2017   HTN (hypertension)    Hx of NSTEMI 5/16 tx with BMS to pRCA and DES to RPLB3 09/10/2014   Hyperglycemia    OSA (obstructive sleep apnea)    "dx'd; couldn't tolerate mask" (03/15/2017)   PAD (peripheral artery disease) ([place])    AAA [place] in 06/2020: aorto iliac atherosclerosis   Tobacco use    Type II diabetes mellitus ([place])    Varicose veins of both lower extremities    S/P ablation 02/2017   Current Medications: Current Meds  Medication Sig   Blood Glucose Monitoring Suppl (TRUE METRIX METER) w/Device KIT Use as instructed. Check blood glucose level by fingerstick twice per day. E11.65   chlorthalidone (HYGROTON) 25 MG tablet Take 1 tablet (25 mg total) by mouth daily.   clopidogrel (PLAVIX) 75 MG tablet Take 1 tablet (75 mg total) by mouth daily.   empagliflozin (JARDIANCE) 25 MG TABS tablet TAKE 1 TABLET (25 MG TOTAL) BY MOUTH DAILY BEFORE BREAKFAST.   ezetimibe (ZETIA) 10 MG tablet Take 1 tablet (10 mg total) by mouth daily.   gabapentin (NEURONTIN) 300 MG capsule Take 1 capsule (300 mg total) by mouth at bedtime.   glimepiride (AMARYL) 2 MG tablet Take 1 tablet (2 mg total) by mouth daily before breakfast.   isosorbide mononitrate (IMDUR) 30 MG 24 hr tablet Take 1 tablet (30 mg total) by mouth daily.   lidocaine (LIDODERM) 5 % Place 1 patch onto the skin daily. Remove & Discard patch within 12 hours or as directed by MD   losartan (COZAAR) 100 MG tablet TAKE 1 TABLET (100 MG TOTAL) BY MOUTH DAILY.   metFORMIN (GLUCOPHAGE) 500 MG tablet Take 1 tablet (500 mg total) by mouth 2 (two) times daily with a meal.   nitroGLYCERIN (NITROSTAT) 0.4 MG SL tablet Place 1 tablet (0.4 mg total) under the tongue every 5 (five) minutes x 3 doses as needed for chest pain.   rosuvastatin (CRESTOR) 40 MG tablet Take 1 tablet (40 mg total) by mouth daily. (Pt. Needs to schedule appt. With Cardiologist in  order to receive further refills. Thank You. 1st Attempt.)   tiZANidine (ZANAFLEX) 4 MG tablet Take 1 tablet (4 mg total) by mouth every 8 (eight) hours as needed for muscle spasms.   Vitamin D, Ergocalciferol, (DRISDOL) 1.25 MG (50000 UNIT) CAPS capsule Take 1 capsule (50,000 Units total) by mouth every 7 (seven) days.    Allergies:   Aspirin and Ibuprofen   Social History   Tobacco Use   Smoking status: Every Day    Packs/day: 1.50    Years:  40.00    Pack years: 60.00    Types: Cigarettes    Start date: 1978   Smokeless tobacco: Former    Types: Chew   Tobacco comments:    currently smoking 0.5ppd as of 07/12/20  Vaping Use   Vaping Use: Never used  Substance Use Topics   Alcohol use: Yes    Alcohol/week: 6.0 standard drinks    Types: 6 Cans of beer per week   Drug use: No    Family Hx: The patient's family history includes Cancer in his maternal grandfather; Heart attack in his maternal grandmother, maternal uncle, and paternal grandmother. There is no history of Stroke or Hypertension.  Review of Systems  Respiratory:  Positive for cough.   Gastrointestinal:  Negative for hematochezia.  Genitourinary:  Negative for hematuria.    EKGs/Labs/Other Test Reviewed:    EKG:  EKG is not ordered today.  The ekg ordered today demonstrates n/a  Recent Labs: 12/03/2020: ALT 20 09/03/2021: BUN 17; Creatinine, Ser 1.08; Hemoglobin 15.3; NT-Pro BNP 243; Platelets 188; Potassium 4.6; Sodium 141   Recent Lipid Panel Recent Labs    12/03/20 1059  CHOL 99*  TRIG 70  HDL 38*  LDLCALC 46     Risk Assessment/Calculations:         Physical Exam:    VS:  BP 130/78   Pulse 78   Ht '6\' 4"'  (1.93 m)   Wt (!) 306 lb 6.4 oz (139 kg)   SpO2 94%   BMI 37.30 kg/m     Wt Readings from Last 3 Encounters:  09/30/21 (!) 306 lb 6.4 oz (139 kg)  09/22/21 (!) 309 lb (140.2 kg)  09/03/21 (!) 309 lb 9.6 oz (140.4 kg)    Constitutional:      Appearance: Healthy appearance. Not in  distress.  Neck:     Vascular: No JVR. JVD normal.  Pulmonary:     Effort: Pulmonary effort is normal.     Breath sounds: No wheezing. No rales.  Cardiovascular:     Normal rate. Regular rhythm. Normal S1. Normal S2.      Murmurs: There is no murmur.  Edema:    Peripheral edema absent.  Abdominal:     Palpations: Abdomen is soft.  Skin:    General: Skin is warm and dry.  Neurological:     General: No focal deficit present.     Mental Status: Alert and oriented to person, place and time.     Cranial Nerves: Cranial nerves are intact.        ASSESSMENT & PLAN:   CAD (coronary artery disease) History of non-STEMI in 2016 treated with a BMS to the RCA and DES to the RPL B3.  Cardiac catheterization in November 2021 demonstrated mild to moderate nonobstructive disease in the RCA and patent stents.  Recent Myoview was low risk.  He has not had further chest pain on his current medical regimen.  No further testing is indicated at this time.  Continue Plavix any 5 mg daily, Zetia 10 mg daily, Imdur 30 mg daily, Crestor 40 mg daily.  Follow-up in 6 months.  SOB (shortness of breath) We could not assess PASP on his echocardiogram.  He continues to smoke.  I suspect he probably has underlying lung disease from smoking.  If he has worsening shortness of breath in the future, we could consider right heart catheterization.  I do not think he needs a right heart catheterization at this time.  His echocardiogram did  demonstrate normal ejection fraction.  Stress test was negative for ischemia.  Essential hypertension Blood pressure is well controlled.  Continue chlorthalidone 25 mg daily, Imdur 30 mg daily, losartan 100 mg daily.  PAD (peripheral artery disease) ([place]) He notes left leg claudication.  He has an evaluation later this week with Dr. Gwenlyn Found.          Dispo:  No follow-ups on file.   Medication Adjustments/Labs and Tests Ordered: Current medicines are reviewed at length with the  patient today.  Concerns regarding medicines are outlined above.  Tests Ordered: No orders of the defined types were placed in this encounter.  Medication Changes: No orders of the defined types were placed in this encounter.  Signed, Richardson Dopp, PA-C  09/30/2021 5:19 PM    [facility] [place], [place], [place]  [address] Phone: [phone]; Fax: [phone]

## 2021-09-30 ENCOUNTER — Encounter: Payer: Self-pay | Admitting: Physician Assistant

## 2021-09-30 ENCOUNTER — Ambulatory Visit (INDEPENDENT_AMBULATORY_CARE_PROVIDER_SITE_OTHER): Payer: Self-pay | Admitting: Physician Assistant

## 2021-09-30 VITALS — BP 130/78 | HR 78 | Ht 76.0 in | Wt 306.4 lb

## 2021-09-30 DIAGNOSIS — I739 Peripheral vascular disease, unspecified: Secondary | ICD-10-CM

## 2021-09-30 DIAGNOSIS — I25119 Atherosclerotic heart disease of native coronary artery with unspecified angina pectoris: Secondary | ICD-10-CM

## 2021-09-30 DIAGNOSIS — R0602 Shortness of breath: Secondary | ICD-10-CM

## 2021-09-30 DIAGNOSIS — I10 Essential (primary) hypertension: Secondary | ICD-10-CM

## 2021-09-30 NOTE — Assessment & Plan Note (Signed)
Blood pressure is well controlled.  Continue chlorthalidone 25 mg daily, Imdur 30 mg daily, losartan 100 mg daily.

## 2021-09-30 NOTE — Assessment & Plan Note (Signed)
He notes left leg claudication.  He has an evaluation later this week with Dr. Allyson Sabal.

## 2021-09-30 NOTE — Patient Instructions (Signed)
Medication Instructions:  Your physician recommends that you continue on your current medications as directed. Please refer to the Current Medication list given to you today.  *If you need a refill on your cardiac medications before your next appointment, please call your pharmacy*   Lab Work: None ordered  If you have labs (blood work) drawn today and your tests are completely normal, you will receive your results only by: [place] (if you have MyChart) OR A paper copy in the mail If you have any lab test that is abnormal or we need to change your treatment, we will call you to review the results.   Testing/Procedures: None ordered   Follow-Up: At [facility], you and your health needs are our priority.  As part of our continuing mission to provide you with exceptional heart care, we have created designated Provider Care Teams.  These Care Teams include your primary Cardiologist (physician) and Advanced Practice Providers (APPs -  Physician Assistants and Nurse Practitioners) who all work together to provide you with the care you need, when you need it.  We recommend signing up for the patient portal called "MyChart".  Sign up information is provided on this After Visit Summary.  MyChart is used to connect with patients for Virtual Visits (Telemedicine).  Patients are able to view lab/test results, encounter notes, upcoming appointments, etc.  Non-urgent messages can be sent to your provider as well.   To learn more about what you can do with MyChart, go to [email or web address].    Your next appointment:   03/30/22 arrive at 9:00    The format for your next appointment:   In Person  Provider:   Richardson Dopp, PA-C       If primary card or EP is not listed click here to update    :1}    Other Instructions   Important Information About Sugar

## 2021-09-30 NOTE — Assessment & Plan Note (Signed)
History of non-STEMI in 2016 treated with a BMS to the RCA and DES to the RPL B3.  Cardiac catheterization in November 2021 demonstrated mild to moderate nonobstructive disease in the RCA and patent stents.  Recent Myoview was low risk.  He has not had further chest pain on his current medical regimen.  No further testing is indicated at this time.  Continue Plavix any 5 mg daily, Zetia 10 mg daily, Imdur 30 mg daily, Crestor 40 mg daily.  Follow-up in 6 months.

## 2021-09-30 NOTE — Assessment & Plan Note (Signed)
We could not assess PASP on his echocardiogram.  He continues to smoke.  I suspect he probably has underlying lung disease from smoking.  If he has worsening shortness of breath in the future, we could consider right heart catheterization.  I do not think he needs a right heart catheterization at this time.  His echocardiogram did demonstrate normal ejection fraction.  Stress test was negative for ischemia.

## 2021-10-02 ENCOUNTER — Other Ambulatory Visit: Payer: Self-pay

## 2021-10-02 ENCOUNTER — Other Ambulatory Visit: Payer: Self-pay | Admitting: Internal Medicine

## 2021-10-02 ENCOUNTER — Encounter: Payer: Self-pay | Admitting: Cardiovascular Disease

## 2021-10-02 ENCOUNTER — Ambulatory Visit (INDEPENDENT_AMBULATORY_CARE_PROVIDER_SITE_OTHER): Payer: Self-pay | Admitting: Cardiovascular Disease

## 2021-10-02 VITALS — BP 142/90 | HR 77 | Ht 76.0 in | Wt 305.0 lb

## 2021-10-02 DIAGNOSIS — I739 Peripheral vascular disease, unspecified: Secondary | ICD-10-CM

## 2021-10-02 DIAGNOSIS — I10 Essential (primary) hypertension: Secondary | ICD-10-CM

## 2021-10-02 MED ORDER — LOSARTAN POTASSIUM 100 MG PO TABS
ORAL_TABLET | Freq: Every day | ORAL | 3 refills | Status: DC
  Filled 2021-10-02: qty 30, 30d supply, fill #0
  Filled 2021-11-04: qty 30, 30d supply, fill #1
  Filled 2021-12-04: qty 30, 30d supply, fill #2
  Filled 2022-01-04: qty 30, 30d supply, fill #3
  Filled 2022-02-01: qty 30, 30d supply, fill #4
  Filled 2022-03-08: qty 30, 30d supply, fill #5

## 2021-10-02 NOTE — Assessment & Plan Note (Signed)
William Young was referred to me by [name], PA-C for PAD.  He does have a history of CAD status post multiple stents in the past.  He also has a 3.8 cm abdominal arctic aneurysm.  He has risk factors including treated hypertension, diabetes and hyperlipidemia as well as continued tobacco abuse.  He does complain of some atypical lower extremity symptoms which does not necessarily sound like claudication.  He does get restless leg syndrome as well.  He had Dopplers performed in our office/7/23 revealing a right ABI of 0.87 and a left of 0.88.  He had mild to moderate disease in his proximal SFAs bilaterally.  I do not think these are significant enough to be causing him symptoms.  We will continue to follow him noninvasively on an annual basis.

## 2021-10-02 NOTE — Patient Instructions (Signed)
Medication Instructions:  Your physician recommends that you continue on your current medications as directed. Please refer to the Current Medication list given to you today.  *If you need a refill on your cardiac medications before your next appointment, please call your pharmacy*   Testing/Procedures: Your physician has requested that you have a lower extremity arterial duplex. This test is an ultrasound of the arteries in the legs. It looks at arterial blood flow in the legs. Allow one hour for Lower Arterial scans. There are no restrictions or special instructions  Your physician has requested that you have an ankle brachial index (ABI). During this test an ultrasound and blood pressure cuff are used to evaluate the arteries that supply the arms and legs with blood. Allow thirty minutes for this exam. There are no restrictions or special instructions. To be done in April 2024. These will be done at [address] [facility]   Follow-Up: At [facility], you and your health needs are our priority.  As part of our continuing mission to provide you with exceptional heart care, we have created designated Provider Care Teams.  These Care Teams include your primary Cardiologist (physician) and Advanced Practice Providers (APPs -  Physician Assistants and Nurse Practitioners) who all work together to provide you with the care you need, when you need it.  We recommend signing up for the patient portal called "MyChart".  Sign up information is provided on this After Visit Summary.  MyChart is used to connect with patients for Virtual Visits (Telemedicine).  Patients are able to view lab/test results, encounter notes, upcoming appointments, etc.  Non-urgent messages can be sent to your provider as well.   To learn more about what you can do with MyChart, go to [email or web address].    Your next appointment:   We will see you on an as needed basis.   Provider:   Nanetta Batty, MD

## 2021-10-02 NOTE — Progress Notes (Signed)
10/02/2021 Randol Zumstein   November 13, 1960  [id number]  Primary Physician Gildardo Pounds, NP Primary Cardiologist: Lorretta Harp MD Lupe Carney, [place]  HPI:  William Young is a 61 y.o. moderately overweight married Caucasian male father of 74, grandfather of 2 grandchildren who is accompanied by his wife Crystal today.  He works as a Counsellor for [employer].  He was referred by Richardson Dopp, PA-C for evaluation of PAD.  His risk factors include 40 pack years of tobacco abuse, treated hypertension, diabetes and hyperlipidemia.  He did have a non-STEMI 5/16 and has had multiple stents since that time.  He also has a small abdominal aortic aneurysm measuring 3.8 cm.  He has some atypical lower extremity symptoms which may or may not be claudication although he does have rest pain at night consistent with restless leg syndrome.  He had Doppler studies performed/7/23 revealing a right ABI of 0.87, left of 0.88 with mild to moderate disease in his proximal SFAs bilaterally.   Current Meds  Medication Sig   Blood Glucose Monitoring Suppl (TRUE METRIX METER) w/Device KIT Use as instructed. Check blood glucose level by fingerstick twice per day. E11.65   chlorthalidone (HYGROTON) 25 MG tablet Take 1 tablet (25 mg total) by mouth daily.   clopidogrel (PLAVIX) 75 MG tablet Take 1 tablet (75 mg total) by mouth daily.   empagliflozin (JARDIANCE) 25 MG TABS tablet TAKE 1 TABLET (25 MG TOTAL) BY MOUTH DAILY BEFORE BREAKFAST.   ezetimibe (ZETIA) 10 MG tablet Take 1 tablet (10 mg total) by mouth daily.   gabapentin (NEURONTIN) 300 MG capsule Take 1 capsule (300 mg total) by mouth at bedtime.   glimepiride (AMARYL) 2 MG tablet Take 1 tablet (2 mg total) by mouth daily before breakfast.   isosorbide mononitrate (IMDUR) 30 MG 24 hr tablet Take 1 tablet (30 mg total) by mouth daily.   lidocaine (LIDODERM) 5 % Place 1 patch onto the skin daily. Remove & Discard patch within 12 hours or as directed by  MD   losartan (COZAAR) 100 MG tablet TAKE 1 TABLET (100 MG TOTAL) BY MOUTH DAILY.   metFORMIN (GLUCOPHAGE) 500 MG tablet Take 1 tablet (500 mg total) by mouth 2 (two) times daily with a meal.   nitroGLYCERIN (NITROSTAT) 0.4 MG SL tablet Place 1 tablet (0.4 mg total) under the tongue every 5 (five) minutes x 3 doses as needed for chest pain.   rosuvastatin (CRESTOR) 40 MG tablet Take 1 tablet (40 mg total) by mouth daily. (Pt. Needs to schedule appt. With Cardiologist in order to receive further refills. Thank You. 1st Attempt.)   tiZANidine (ZANAFLEX) 4 MG tablet Take 1 tablet (4 mg total) by mouth every 8 (eight) hours as needed for muscle spasms.   Vitamin D, Ergocalciferol, (DRISDOL) 1.25 MG (50000 UNIT) CAPS capsule Take 1 capsule (50,000 Units total) by mouth every 7 (seven) days.     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 Neither his parents nor siblings have any cardiac issues that he knows of.   Social Determinants of Health   Financial Resource Strain: Not on  file  Food Insecurity: Not on file  Transportation Needs: Not on file  Physical Activity: Not on file  Stress: Not on file  Social Connections: Not on file  Intimate Partner Violence: Not on file     Review of Systems: General: negative for chills, fever, night sweats or weight changes.  Cardiovascular: negative for chest pain, dyspnea on exertion, edema, orthopnea, palpitations, paroxysmal nocturnal dyspnea or shortness of breath Dermatological: negative for rash Respiratory: negative for cough or wheezing Urologic: negative for hematuria Abdominal: negative for nausea, vomiting, diarrhea, bright red blood per rectum, melena, or hematemesis Neurologic: negative for visual changes, syncope, or dizziness All other systems reviewed and are otherwise negative except as noted above.    Blood pressure (!) 142/90, pulse 77, height '6\' 4"'  (1.93 m), weight (!) 305 lb (138.3 kg), SpO2 94 %.  General appearance: alert and no distress Neck: no adenopathy, no carotid bruit, no JVD, supple, symmetrical, trachea midline, and thyroid not enlarged, symmetric, no tenderness/mass/nodules Lungs: clear to auscultation bilaterally Heart: regular rate and rhythm, S1, S2 normal, no murmur, click, rub or gallop Extremities: extremities normal, atraumatic, no cyanosis or edema Pulses: 2+ and symmetric Skin: Skin color, texture, turgor normal. No rashes or lesions Neurologic: Grossly normal  EKG not performed today  ASSESSMENT AND PLAN:   Peripheral arterial disease [facility]) Mr. Beg was referred to me by Richardson Dopp, PA-C for PAD.  He does have a history of CAD status post multiple stents in the past.  He also has a 3.8 cm abdominal arctic aneurysm.  He has risk factors including treated hypertension, diabetes and hyperlipidemia as well as continued tobacco abuse.  He does complain of some atypical lower extremity symptoms which does not necessarily sound like claudication.  He does get restless leg  syndrome as well.  He had Dopplers performed in our office/7/23 revealing a right ABI of 0.87 and a left of 0.88.  He had mild to moderate disease in his proximal SFAs bilaterally.  I do not think these are significant enough to be causing him symptoms.  We will continue to follow him noninvasively on an annual basis.     Lorretta Harp MD FACP,FACC,FAHA, [facility] 10/02/2021 10:13 AM

## 2021-10-03 ENCOUNTER — Other Ambulatory Visit: Payer: Self-pay

## 2021-10-09 ENCOUNTER — Other Ambulatory Visit: Payer: Self-pay | Admitting: Family Medicine

## 2021-10-09 DIAGNOSIS — E1165 Type 2 diabetes mellitus with hyperglycemia: Secondary | ICD-10-CM

## 2021-10-09 MED ORDER — GLIMEPIRIDE 2 MG PO TABS
2.0000 mg | ORAL_TABLET | Freq: Every day | ORAL | 0 refills | Status: DC
  Filled 2021-10-09 – 2021-10-10 (×2): qty 30, 30d supply, fill #0

## 2021-10-10 ENCOUNTER — Other Ambulatory Visit: Payer: Self-pay

## 2021-10-13 ENCOUNTER — Telehealth: Payer: Self-pay | Admitting: Pharmacist

## 2021-10-13 ENCOUNTER — Other Ambulatory Visit: Payer: Self-pay

## 2021-10-13 NOTE — Telephone Encounter (Signed)
Patient called and is requesting Tylenol #3 refills. He has not been seen since 04/24/2021. I will forward request to PCP for review but it is likely he'll need an appointment for refills.

## 2021-10-13 NOTE — Telephone Encounter (Signed)
NEEDS OFFICE VISIT first available. Can see angela as well.

## 2021-10-22 ENCOUNTER — Ambulatory Visit: Payer: Self-pay | Admitting: Emergency Medicine

## 2021-10-28 ENCOUNTER — Other Ambulatory Visit: Payer: Self-pay

## 2021-10-30 ENCOUNTER — Other Ambulatory Visit: Payer: Self-pay

## 2021-11-03 ENCOUNTER — Other Ambulatory Visit: Payer: Self-pay

## 2021-11-04 ENCOUNTER — Other Ambulatory Visit: Payer: Self-pay

## 2021-11-04 ENCOUNTER — Other Ambulatory Visit: Payer: Self-pay | Admitting: Family Medicine

## 2021-11-04 DIAGNOSIS — E1165 Type 2 diabetes mellitus with hyperglycemia: Secondary | ICD-10-CM

## 2021-11-05 ENCOUNTER — Other Ambulatory Visit: Payer: Self-pay

## 2021-11-06 ENCOUNTER — Other Ambulatory Visit: Payer: Self-pay

## 2021-11-08 ENCOUNTER — Other Ambulatory Visit: Payer: Self-pay | Admitting: Nurse Practitioner

## 2021-11-08 DIAGNOSIS — E1165 Type 2 diabetes mellitus with hyperglycemia: Secondary | ICD-10-CM

## 2021-11-10 ENCOUNTER — Telehealth: Payer: Self-pay | Admitting: Physician Assistant

## 2021-11-10 ENCOUNTER — Other Ambulatory Visit: Payer: Self-pay

## 2021-11-10 ENCOUNTER — Other Ambulatory Visit: Payer: Self-pay | Admitting: Nurse Practitioner

## 2021-11-10 ENCOUNTER — Other Ambulatory Visit: Payer: Self-pay | Admitting: Family Medicine

## 2021-11-10 DIAGNOSIS — E1165 Type 2 diabetes mellitus with hyperglycemia: Secondary | ICD-10-CM

## 2021-11-10 MED ORDER — GLIMEPIRIDE 2 MG PO TABS
2.0000 mg | ORAL_TABLET | Freq: Every day | ORAL | 0 refills | Status: DC
  Filled 2021-11-10: qty 30, 30d supply, fill #0

## 2021-11-10 MED ORDER — EMPAGLIFLOZIN 25 MG PO TABS
ORAL_TABLET | ORAL | 0 refills | Status: DC
  Filled 2021-11-10: qty 30, 30d supply, fill #0

## 2021-11-10 NOTE — Telephone Encounter (Signed)
Pt c/o medication issue:  1. Name of Medication: empagliflozin (JARDIANCE) 25 MG TABS tablet; glimepiride (AMARYL) 2 MG tablet  2. How are you currently taking this medication (dosage and times per day)? As written  3. Are you having a reaction (difficulty breathing--STAT)? No   4. What is your medication issue? Patient needs a new prescription sent in for both medication. Please send in to  [facility]

## 2021-11-10 NOTE — Telephone Encounter (Signed)
Called patient's girlfriend (DPR) about message. Encouraged her to call patient's PCP about refills. Will send message to PCP.

## 2021-11-10 NOTE — Telephone Encounter (Signed)
Requested Prescriptions  Pending Prescriptions Disp Refills  . glimepiride (AMARYL) 2 MG tablet 30 tablet 0    Sig: Take 1 tablet (2 mg total) by mouth daily before breakfast.     Endocrinology:  Diabetes - Sulfonylureas Failed - 11/08/2021 10:22 AM      Failed - HBA1C is between 0 and 7.9 and within 180 days    HbA1c, POC (controlled diabetic range)  Date Value Ref Range Status  04/24/2021 7.3 (A) 0.0 - 7.0 % Final         Failed - Valid encounter within last 6 months    Recent Outpatient Visits          6 months ago Type 2 diabetes mellitus with hyperglycemia, without long-term current use of insulin (HCC)   [facility] [place], [place], MD   1 year ago Type 2 diabetes mellitus with hyperglycemia, without long-term current use of insulin [facility])   [facility] [place], [place] W, NP   1 year ago Type 2 diabetes mellitus with hyperglycemia, without long-term current use of insulin [facility])   [place] [facility] [place], Shea Stakes, NP   1 year ago Encounter to establish care   [facility] [place], [place] W, NP   4 years ago Type 2 diabetes mellitus without complication, without long-term current use of insulin (HCC)   [facility] [place], Shea Stakes, NP      Future Appointments            In 2 weeks Byrum, Les Pou, MD [facility]   In 1 month [place], Marzella Schlein, PA-C [facility]   In 4 months Alben Spittle, [place] T, PA-C [facility]           Passed - Cr in normal range and within 360 days    Creat  Date Value Ref Range Status  12/27/2015 0.75 0.70 - 1.33 mg/dL Final    Comment:      For patients > or = 61 years of age: The upper reference limit for Creatinine is approximately 13% higher for people identified as African-American.      Creatinine, Ser  Date Value Ref Range  Status  09/03/2021 1.08 0.76 - 1.27 mg/dL Final   Creatinine, Urine  Date Value Ref Range Status  11/18/2015 390 (H) 20 - 370 mg/dL Final    Comment:    Result confirmed by automatic dilution. Result repeated and verified.

## 2021-11-10 NOTE — Telephone Encounter (Signed)
Pt's wife called requesting an expedited order on these refills

## 2021-11-11 ENCOUNTER — Other Ambulatory Visit: Payer: Self-pay | Admitting: Family Medicine

## 2021-11-11 DIAGNOSIS — E1165 Type 2 diabetes mellitus with hyperglycemia: Secondary | ICD-10-CM

## 2021-11-12 ENCOUNTER — Other Ambulatory Visit: Payer: Self-pay

## 2021-11-12 MED ORDER — METFORMIN HCL 500 MG PO TABS
500.0000 mg | ORAL_TABLET | Freq: Two times a day (BID) | ORAL | 0 refills | Status: DC
  Filled 2021-11-12: qty 60, 30d supply, fill #0

## 2021-11-13 ENCOUNTER — Other Ambulatory Visit: Payer: Self-pay

## 2021-11-25 ENCOUNTER — Other Ambulatory Visit: Payer: Self-pay | Admitting: Nurse Practitioner

## 2021-11-25 DIAGNOSIS — M519 Unspecified thoracic, thoracolumbar and lumbosacral intervertebral disc disorder: Secondary | ICD-10-CM

## 2021-11-26 ENCOUNTER — Other Ambulatory Visit: Payer: Self-pay

## 2021-11-26 MED ORDER — GABAPENTIN 300 MG PO CAPS
300.0000 mg | ORAL_CAPSULE | Freq: Every day | ORAL | 2 refills | Status: DC
  Filled 2021-11-26: qty 30, 30d supply, fill #0
  Filled 2021-12-28: qty 30, 30d supply, fill #1
  Filled 2022-01-26: qty 30, 30d supply, fill #2

## 2021-11-27 ENCOUNTER — Encounter: Payer: Self-pay | Admitting: Emergency Medicine

## 2021-11-27 ENCOUNTER — Ambulatory Visit (INDEPENDENT_AMBULATORY_CARE_PROVIDER_SITE_OTHER): Payer: Self-pay | Admitting: Emergency Medicine

## 2021-11-27 DIAGNOSIS — Z720 Tobacco use: Secondary | ICD-10-CM

## 2021-11-27 DIAGNOSIS — J449 Chronic obstructive pulmonary disease, unspecified: Secondary | ICD-10-CM

## 2021-11-27 DIAGNOSIS — R918 Other nonspecific abnormal finding of lung field: Secondary | ICD-10-CM

## 2021-11-27 DIAGNOSIS — G4733 Obstructive sleep apnea (adult) (pediatric): Secondary | ICD-10-CM

## 2021-11-27 NOTE — Addendum Note (Signed)
Addended by: Dorisann Frames R on: 11/27/2021 11:40 AM   Modules accepted: Orders

## 2021-11-27 NOTE — Assessment & Plan Note (Signed)
Stable after 1 year on his most recent CT that was done in January 2023.  At this point I think we should refer him to the lung cancer screening program with a neck scan to be done in January 2024.  He agrees with this.

## 2021-11-27 NOTE — Assessment & Plan Note (Signed)
Discussed cessation with him today.  He is going to make an effort to cut down.  He is not ready to set a quit date.

## 2021-11-27 NOTE — Progress Notes (Signed)
Subjective:    Patient ID: William Young, male    DOB: [birth year], 61 y.o.   MRN: [id number]  HPI 61 year old man with history of tobacco use (60 pack years), CAD/non-STEMI/PCI, hypertension, OSA not on CPAP, AAA, diabetes.  He is referred today for evaluation of an abnormal CT scan of the chest.  A CT angio of his chest abdomen pelvis was performed on 05/18/2020 when he developed low back pain and the right lower extremity neuro changes.  I have reviewed.  This shows no significant mediastinal adenopathy, a 6 x 5 mm posterior right upper lobe pulmonary nodule and a 3 mm right middle lobe nodule  He reports that he still has the back and RLE pain - sounds like sciatica. He is not limited by his breathing. No cough, no wheeze.    ROV 11/27/2021 --follow-up visit for 61 year old gentleman with a history of tobacco use, actively smoking (60 pack years).  He has CAD, hypertension, OSA not on CPAP, diabetes, AAA.  I saw him in March 2022.  This was following a CT angiogram that showed a 6 mm posterior right upper lobe nodule, 3 mm right middle lobe nodule.  In retrospect there was a comparison CT going back to 05/18/2020.  Based on this we repeated a CT chest in January 2023 as below He plays golf, is able to walk without significant SOB. No wheeze or cough. Smokes less than pk a day currently.   CT chest 05/26/2021 reviewed by me, shows scattered pulmonary nodules including a 7 mm anterior left lower lobe nodule.  All of these were unchanged compared with 05/18/2020.   Review of Systems As per HPI  Past Medical History:  Diagnosis Date   Abdominal aortic aneurysm (AAA) ([place])    CT 1/22: infrarenal 3.8 cm - repeat US in 2 years // AAA [place] 2/22: AAA 3.8 cm; aorto iliac atherosclerosis without hemodynamically significant stenosis   Aortic atherosclerosis ([place])    CT in 03/2020   CAD (coronary artery disease) 09/2014   a. NSTEMI >> LHC 5/16:  mLAD 15, pRCA 90, mRCA 40, RPLB3 80, EF normal with inf HK  >> PCI:  BMS to pRCA and DES to RPLB3 // Cath 11/21: Patent RCA stent, patent RPL stent; medical therapy    GERD (gastroesophageal reflux disease)    History of echocardiogram    Echo 6/16:  EF 50-55%, no RWMA, Gr 1 DD   HLD (hyperlipidemia)    Ar[facility] Endo 03/15/2017   HTN (hypertension)    Hx of NSTEMI 5/16 tx with BMS to pRCA and DES to RPLB3 09/10/2014   Hyperglycemia    OSA (obstructive sleep apnea)    "dx'd; couldn't tolerate mask" (03/15/2017)   PAD (peripheral artery disease) ([place])    AAA [place] in 06/2020: aorto iliac atherosclerosis   Tobacco use    Type II diabetes mellitus ([place])    Varicose veins of both lower extremities    S/P ablation 02/2017     Family History  Problem Relation Age of Onset   Heart attack Maternal Uncle    Heart attack Paternal Grandmother    Cancer Maternal Grandfather    Heart attack Maternal Grandmother    Stroke Neg Hx    Hypertension Neg Hx      Social History   Socioeconomic History   Marital status: Widowed    Spouse name: Not on file   Number of children: Not on file   Years of education: Not on file   Highest  education level: Not on file  Occupational History   Occupation: Service water purification systems  Tobacco Use   Smoking status: Every Day    Packs/day: 1.00    Years: 40.00    Total pack years: 40.00    Types: Cigarettes    Start date: 1978   Smokeless tobacco: Former    Types: Chew   Tobacco comments:    Smokes 5 packs a week. [place] 11/27/21  Vaping Use   Vaping Use: Never used  Substance and Sexual Activity   Alcohol use: Yes    Alcohol/week: 6.0 standard drinks of alcohol    Types: 6 Cans of beer per week   Drug use: No   Sexual activity: Not Currently  Other Topics Concern   Not on file  Social History Narrative   Lives with fiance and 2 daughters. Neither his parents nor siblings have any cardiac issues that he knows of.   Social Determinants of Health   Financial Resource Strain: Not on file  Food Insecurity: Not  on file  Transportation Needs: Not on file  Physical Activity: Not on file  Stress: Not on file  Social Connections: Not on file  Intimate Partner Violence: Not on file    He is from [place], has lived in [place], [place] Has worked as telemarketer, now a grill cook He is smoking 1/2 pk/day Was in the [employer], worked as a cook, never out of the country.  No other exposure.    Allergies  Allergen Reactions   Aspirin Anaphylaxis, Swelling and Rash    Throat closes    Ibuprofen Anaphylaxis, Swelling and Rash     Outpatient Medications Prior to Visit  Medication Sig Dispense Refill   Blood Glucose Monitoring Suppl (TRUE METRIX METER) w/Device KIT Use as instructed. Check blood glucose level by fingerstick twice per day. E11.65 1 kit 0   chlorthalidone (HYGROTON) 25 MG tablet Take 1 tablet (25 mg total) by mouth daily. 90 tablet 3   clopidogrel (PLAVIX) 75 MG tablet Take 1 tablet (75 mg total) by mouth daily. 90 tablet 3   empagliflozin (JARDIANCE) 25 MG TABS tablet TAKE 1 TABLET (25 MG TOTAL) BY MOUTH DAILY BEFORE BREAKFAST. 30 tablet 0   ezetimibe (ZETIA) 10 MG tablet Take 1 tablet (10 mg total) by mouth daily. 90 tablet 0   gabapentin (NEURONTIN) 300 MG capsule Take 1 capsule (300 mg total) by mouth at bedtime. 30 capsule 2   glimepiride (AMARYL) 2 MG tablet Take 1 tablet (2 mg total) by mouth daily before breakfast. (keep upcoming appt for future refills) 30 tablet 0   isosorbide mononitrate (IMDUR) 30 MG 24 hr tablet Take 1 tablet (30 mg total) by mouth daily. 90 tablet 3   lidocaine (LIDODERM) 5 % Place 1 patch onto the skin daily. Remove & Discard patch within 12 hours or as directed by MD 30 patch 0   losartan (COZAAR) 100 MG tablet TAKE 1 TABLET (100 MG TOTAL) BY MOUTH ONCE DAILY. 90 tablet 3   metFORMIN (GLUCOPHAGE) 500 MG tablet Take 1 tablet (500 mg total) by mouth 2 (two) times daily with a meal. 60 tablet 0   nitroGLYCERIN (NITROSTAT) 0.4 MG SL tablet Place 1 tablet (0.4 mg total) under  the tongue every 5 (five) minutes x 3 doses as needed for chest pain. 25 tablet 12   rosuvastatin (CRESTOR) 40 MG tablet Take 1 tablet (40 mg total) by mouth daily. (Pt. Needs to schedule appt. With Cardiologist in order to   receive further refills. Thank You. 1st Attempt.) 30 tablet 0   tiZANidine (ZANAFLEX) 4 MG tablet Take 1 tablet (4 mg total) by mouth every 8 (eight) hours as needed for muscle spasms. 60 tablet 1   Vitamin D, Ergocalciferol, (DRISDOL) 1.25 MG (50000 UNIT) CAPS capsule Take 1 capsule (50,000 Units total) by mouth every 7 (seven) days. 4 capsule 0   No facility-administered medications prior to visit.        Objective:   Physical Exam Vitals:   11/27/21 1118  BP: 130/80  Pulse: 80  Temp: 97.6 F (36.4 C)  TempSrc: Oral  SpO2: 96%  Weight: (!) 312 lb 3.2 oz (141.6 kg)  Height: 6' 3.75" (1.924 m)   Gen: Pleasant, obese man, in no distress,  normal affect  ENT: No lesions,  mouth clear, poor dentition, oropharynx clear, no postnasal drip  Neck: No JVD, no stridor  Lungs: No use of accessory muscles, distant but no crackles or wheezing on normal respiration, no wheeze on forced expiration  Cardiovascular: RRR, heart sounds normal, no murmur or gallops, no peripheral edema  Musculoskeletal: No deformities, no cyanosis or clubbing  Neuro: alert, awake, non focal  Skin: Warm, no lesions or rash     Assessment & Plan:  Tobacco use Discussed cessation with him today.  He is going to make an effort to cut down.  He is not ready to set a quit date.  Obstructive sleep apnea Currently untreated.  I think he would benefit from getting back on CPAP.  We can talk about this going forward.  Pulmonary nodules Stable after 1 year on his most recent CT that was done in January 2023.  At this point I think we should refer him to the lung cancer screening program with a neck scan to be done in January 2024.  He agrees with this.  COPD (chronic obstructive pulmonary  disease) (HCC) Presumed COPD based on emphysematous change noted on his CT chest, tobacco history.  He is currently asymptomatic, has a good functional capacity.  At some point he will need pulmonary function testing, probably bronchodilator therapy.  He is not ready to do so yet.  We can revisit going forward.  Robert Byrum, MD, PhD 11/27/2021, 11:38 AM [facility] [phone] or if no answer before 7:00PM call [phone] For any issues after 7:00PM please call eLink [phone]  

## 2021-11-27 NOTE — Assessment & Plan Note (Signed)
Currently untreated.  I think he would benefit from getting back on CPAP.  We can talk about this going forward.

## 2021-11-27 NOTE — Patient Instructions (Signed)
We reviewed your CT scan from January 2023 today.  Pulmonary nodules have been stable. We will refer you to the lung cancer screening program, first dedicated scan to be done in January 2024 Continue to work on decreasing your cigarettes.  Ultimate goal would be to stop altogether. Agree with continuing to work on your exercise and conditioning We will hold off on performing breathing test for now.  You may benefit from this or from inhaler medication at some point in the future. We can revisit the possible benefits of treating your sleep apnea in the future.   Follow Dr. Delton Coombes in January 2024 after your CT so we can review the results together.

## 2021-11-27 NOTE — Assessment & Plan Note (Signed)
Presumed COPD based on emphysematous change noted on his CT chest, tobacco history.  He is currently asymptomatic, has a good functional capacity.  At some point he will need pulmonary function testing, probably bronchodilator therapy.  He is not ready to do so yet.  We can revisit going forward.

## 2021-11-30 ENCOUNTER — Other Ambulatory Visit: Payer: Self-pay | Admitting: Physician Assistant

## 2021-12-01 ENCOUNTER — Other Ambulatory Visit: Payer: Self-pay

## 2021-12-01 MED ORDER — EZETIMIBE 10 MG PO TABS
10.0000 mg | ORAL_TABLET | Freq: Every day | ORAL | 3 refills | Status: DC
Start: 2021-12-01 — End: 2022-11-21
  Filled 2021-12-01: qty 90, 90d supply, fill #0
  Filled 2022-03-01: qty 90, 90d supply, fill #1
  Filled 2022-05-27: qty 90, 90d supply, fill #2
  Filled 2022-08-30: qty 90, 90d supply, fill #3

## 2021-12-04 ENCOUNTER — Other Ambulatory Visit: Payer: Self-pay | Admitting: Nurse Practitioner

## 2021-12-04 ENCOUNTER — Other Ambulatory Visit: Payer: Self-pay

## 2021-12-04 DIAGNOSIS — E1165 Type 2 diabetes mellitus with hyperglycemia: Secondary | ICD-10-CM

## 2021-12-04 MED ORDER — EMPAGLIFLOZIN 25 MG PO TABS
ORAL_TABLET | ORAL | 0 refills | Status: DC
  Filled 2021-12-04: qty 30, 30d supply, fill #0

## 2021-12-08 ENCOUNTER — Other Ambulatory Visit: Payer: Self-pay | Admitting: Nurse Practitioner

## 2021-12-08 ENCOUNTER — Other Ambulatory Visit: Payer: Self-pay

## 2021-12-08 DIAGNOSIS — E1165 Type 2 diabetes mellitus with hyperglycemia: Secondary | ICD-10-CM

## 2021-12-09 ENCOUNTER — Other Ambulatory Visit: Payer: Self-pay | Admitting: Family Medicine

## 2021-12-09 DIAGNOSIS — E1165 Type 2 diabetes mellitus with hyperglycemia: Secondary | ICD-10-CM

## 2021-12-10 ENCOUNTER — Other Ambulatory Visit: Payer: Self-pay

## 2021-12-10 NOTE — Telephone Encounter (Signed)
Requested medications are due for refill today.  yes  Requested medications are on the active medications list.  yes  Last refill. 11/10/2021 a courtesy refill #30  Future visit scheduled.   Yes 12/11/2021  Notes to clinic.  Courtesy refill given     Requested Prescriptions  Pending Prescriptions Disp Refills   glimepiride (AMARYL) 2 MG tablet 30 tablet 0    Sig: Take 1 tablet (2 mg total) by mouth daily before breakfast. (keep upcoming appt for future refills)     Endocrinology:  Diabetes - Sulfonylureas Failed - 12/08/2021  4:39 PM      Failed - HBA1C is between 0 and 7.9 and within 180 days    HbA1c, POC (controlled diabetic range)  Date Value Ref Range Status  04/24/2021 7.3 (A) 0.0 - 7.0 % Final         Failed - Valid encounter within last 6 months    Recent Outpatient Visits           7 months ago Type 2 diabetes mellitus with hyperglycemia, without long-term current use of insulin (HCC)   [facility] [place], [place], MD   1 year ago Type 2 diabetes mellitus with hyperglycemia, without long-term current use of insulin (HCC)   [facility] [place], [place] W, NP   1 year ago Type 2 diabetes mellitus with hyperglycemia, without long-term current use of insulin [facility])   [facility] [place], Shea Stakes, NP   1 year ago Encounter to establish care   [facility] [place], [place] W, NP   4 years ago Type 2 diabetes mellitus without complication, without long-term current use of insulin [facility])   [facility] [place], Shea Stakes, NP       Future Appointments             Tomorrow Sharon Seller, Marzella Schlein, PA-C [facility]   In 3 months Alben Spittle, [place] T, PA-C [facility], [facility]            Passed - Cr in normal range and within 360 days    Creat  Date Value Ref Range Status  12/27/2015 0.75  0.70 - 1.33 mg/dL Final    Comment:      For patients > or = 61 years of age: The upper reference limit for Creatinine is approximately 13% higher for people identified as African-American.      Creatinine, Ser  Date Value Ref Range Status  09/03/2021 1.08 0.76 - 1.27 mg/dL Final   Creatinine, Urine  Date Value Ref Range Status  11/18/2015 390 (H) 20 - 370 mg/dL Final    Comment:    Result confirmed by automatic dilution. Result repeated and verified.

## 2021-12-11 ENCOUNTER — Ambulatory Visit: Payer: Self-pay | Attending: Physician Assistant | Admitting: Physician Assistant

## 2021-12-11 ENCOUNTER — Other Ambulatory Visit: Payer: Self-pay

## 2021-12-11 ENCOUNTER — Encounter: Payer: Self-pay | Admitting: Physician Assistant

## 2021-12-11 VITALS — BP 114/77 | HR 86 | Ht 76.0 in | Wt 312.4 lb

## 2021-12-11 DIAGNOSIS — I10 Essential (primary) hypertension: Secondary | ICD-10-CM

## 2021-12-11 DIAGNOSIS — E785 Hyperlipidemia, unspecified: Secondary | ICD-10-CM

## 2021-12-11 DIAGNOSIS — E1165 Type 2 diabetes mellitus with hyperglycemia: Secondary | ICD-10-CM

## 2021-12-11 DIAGNOSIS — M519 Unspecified thoracic, thoracolumbar and lumbosacral intervertebral disc disorder: Secondary | ICD-10-CM

## 2021-12-11 LAB — POCT GLYCOSYLATED HEMOGLOBIN (HGB A1C): HbA1c, POC (controlled diabetic range): 7.9 % — AB (ref 0.0–7.0)

## 2021-12-11 LAB — GLUCOSE, POCT (MANUAL RESULT ENTRY): POC Glucose: 165 mg/dl — AB (ref 70–99)

## 2021-12-11 MED ORDER — GLIMEPIRIDE 4 MG PO TABS
4.0000 mg | ORAL_TABLET | Freq: Every day | ORAL | 3 refills | Status: DC
  Filled 2021-12-11: qty 30, 30d supply, fill #0
  Filled 2022-01-04: qty 30, 30d supply, fill #1
  Filled 2022-02-08 – 2022-02-09 (×2): qty 30, 30d supply, fill #2
  Filled 2022-03-10: qty 30, 30d supply, fill #3

## 2021-12-11 MED ORDER — EMPAGLIFLOZIN 25 MG PO TABS
ORAL_TABLET | ORAL | 3 refills | Status: DC
  Filled 2021-12-11: qty 30, fill #0
  Filled 2022-01-04: qty 30, 30d supply, fill #0
  Filled 2022-02-08 – 2022-02-09 (×2): qty 30, 30d supply, fill #1
  Filled 2022-03-08: qty 30, 30d supply, fill #2

## 2021-12-11 MED ORDER — ROSUVASTATIN CALCIUM 40 MG PO TABS
40.0000 mg | ORAL_TABLET | Freq: Every day | ORAL | 3 refills | Status: DC
  Filled 2021-12-11: qty 30, 30d supply, fill #0
  Filled 2022-01-08: qty 30, 30d supply, fill #1
  Filled 2022-02-08: qty 30, 30d supply, fill #2
  Filled 2022-03-10: qty 30, 30d supply, fill #3

## 2021-12-11 MED ORDER — METFORMIN HCL 500 MG PO TABS
500.0000 mg | ORAL_TABLET | Freq: Two times a day (BID) | ORAL | 3 refills | Status: DC
  Filled 2021-12-11: qty 60, 30d supply, fill #0
  Filled 2022-01-08: qty 60, 30d supply, fill #1
  Filled 2022-02-08 – 2022-02-09 (×3): qty 60, 30d supply, fill #2
  Filled 2022-03-10: qty 60, 30d supply, fill #3

## 2021-12-11 MED ORDER — TIZANIDINE HCL 4 MG PO TABS
4.0000 mg | ORAL_TABLET | Freq: Three times a day (TID) | ORAL | 1 refills | Status: DC | PRN
  Filled 2021-12-11: qty 60, 20d supply, fill #0
  Filled 2022-02-01: qty 60, 20d supply, fill #1

## 2021-12-11 NOTE — Progress Notes (Signed)
Patient ID: William Young, male   DOB: 05/30/1960, 61 y.o.   MRN: [id number]   William Young, is a 61 y.o. male  CSN:[id number]  MRN:[id number]  DOB - 02/11/1961  Chief Complaint  Patient presents with   Diabetes   Medication Refill       Subjective:   William Young is a 61 y.o. male here today for med RF.  No new issues or concerns.  Says he takes his meds consistently.  His wife is here with him.  She says he has poor diet and has gained weight.  Seen by cardiology 5/25 and annual visits recommended at that time.  No SOB.  No CP.    Still has various muscle spasms and would like muscle relaxer to be RF.    No problems updated.  ALLERGIES: Allergies  Allergen Reactions   Aspirin Anaphylaxis, Swelling and Rash    Throat closes    Ibuprofen Anaphylaxis, Swelling and Rash    PAST MEDICAL HISTORY: Past Medical History:  Diagnosis Date   Abdominal aortic aneurysm (AAA) (HCC)    CT 1/22: infrarenal 3.8 cm - repeat US in 2 years // AAA US 2/22: AAA 3.8 cm; aorto iliac atherosclerosis without hemodynamically significant stenosis   Aortic atherosclerosis (HCC)    CT in 03/2020   CAD (coronary artery disease) 09/2014   a. NSTEMI >> LHC 5/16:  mLAD 15, pRCA 90, mRCA 40, RPLB3 80, EF normal with inf HK >> PCI:  BMS to pRCA and DES to RPLB3 // Cath 11/21: Patent RCA stent, patent RPL stent; medical therapy    GERD (gastroesophageal reflux disease)    History of echocardiogram    Echo 6/16:  EF 50-55%, no RWMA, Gr 1 DD   HLD (hyperlipidemia)    /notes 03/15/2017   HTN (hypertension)    Hx of NSTEMI 5/16 tx with BMS to pRCA and DES to RPLB3 09/10/2014   Hyperglycemia    OSA (obstructive sleep apnea)    "dx'd; couldn't tolerate mask" (03/15/2017)   PAD (peripheral artery disease) (HCC)    AAA US in 06/2020: aorto iliac atherosclerosis   Tobacco use    Type II diabetes mellitus (HCC)    Varicose veins of both lower extremities    S/P ablation 02/2017    MEDICATIONS AT  HOME: Prior to Admission medications   Medication Sig Start Date End Date Taking? Authorizing Provider  Blood Glucose Monitoring Suppl (TRUE METRIX METER) w/Device KIT Use as instructed. Check blood glucose level by fingerstick twice per day. E11.65 05/26/20  Yes Fleming, Zelda W, NP  chlorthalidone (HYGROTON) 25 MG tablet Take 1 tablet (25 mg total) by mouth daily. 09/03/21  Yes Weaver, Scott T, PA-C  clopidogrel (PLAVIX) 75 MG tablet Take 1 tablet (75 mg total) by mouth daily. 09/03/21  Yes Weaver, Scott T, PA-C  ezetimibe (ZETIA) 10 MG tablet Take 1 tablet (10 mg total) by mouth daily. 12/01/21  Yes Ross, Paula V, MD  gabapentin (NEURONTIN) 300 MG capsule Take 1 capsule (300 mg total) by mouth at bedtime. 11/26/21  Yes Fleming, Zelda W, NP  glimepiride (AMARYL) 4 MG tablet Take 1 tablet (4 mg total) by mouth daily before breakfast. 12/11/21  Yes McClung, Angela M, PA-C  isosorbide mononitrate (IMDUR) 30 MG 24 hr tablet Take 1 tablet (30 mg total) by mouth daily. 09/03/21  Yes Weaver, Scott T, PA-C  lidocaine (LIDODERM) 5 % Place 1 patch onto the skin daily. Remove & Discard patch within 12 hours or as   directed by MD 04/24/21  Yes Newlin, Enobong, MD  losartan (COZAAR) 100 MG tablet TAKE 1 TABLET (100 MG TOTAL) BY MOUTH ONCE DAILY. 10/02/21  Yes Ross, Paula V, MD  nitroGLYCERIN (NITROSTAT) 0.4 MG SL tablet Place 1 tablet (0.4 mg total) under the tongue every 5 (five) minutes x 3 doses as needed for chest pain. 09/03/21  Yes Weaver, Scott T, PA-C  Vitamin D, Ergocalciferol, (DRISDOL) 1.25 MG (50000 UNIT) CAPS capsule Take 1 capsule (50,000 Units total) by mouth every 7 (seven) days. 03/14/21  Yes Newlin, Enobong, MD  empagliflozin (JARDIANCE) 25 MG TABS tablet TAKE 1 TABLET (25 MG TOTAL) BY MOUTH DAILY BEFORE BREAKFAST. 12/11/21   McClung, Angela M, PA-C  metFORMIN (GLUCOPHAGE) 500 MG tablet Take 1 tablet (500 mg total) by mouth 2 (two) times daily with a meal. 12/11/21   McClung, Angela M, PA-C  rosuvastatin  (CRESTOR) 40 MG tablet Take 1 tablet (40 mg total) by mouth daily. 12/11/21   McClung, Angela M, PA-C  tiZANidine (ZANAFLEX) 4 MG tablet Take 1 tablet (4 mg total) by mouth every 8 (eight) hours as needed for muscle spasms. 12/11/21   McClung, Angela M, PA-C  atorvastatin (LIPITOR) 80 MG tablet Take 1 tablet (80 mg total) by mouth daily. 04/03/20 05/27/20  Ross, Paula V, MD    ROS: Neg HEENT Neg resp Neg cardiac Neg GI Neg GU Neg psych Neg neuro  Objective:   Vitals:   12/11/21 1126  BP: 114/77  Pulse: 86  SpO2: 92%  Weight: (!) 312 lb 6.4 oz (141.7 kg)  Height: 6' 4" (1.93 m)   Exam General appearance : Awake, alert, not in any distress. Speech Clear. Not toxic looking HEENT: Atraumatic and Normocephalic Neck: Supple, no JVD. No cervical lymphadenopathy.  Chest: Good air entry bilaterally, CTAB.  No rales/rhonchi/wheezing CVS: S1 S2 regular, no murmurs.  Extremities: B/L Lower Ext shows no edema, both legs are warm to touch Neurology: Awake alert, and oriented X 3, CN II-XII intact, Non focal Skin: No Rash  Data Review Lab Results  Component Value Date   HGBA1C 7.9 (A) 12/11/2021   HGBA1C 7.3 (A) 04/24/2021   HGBA1C 8.8 (A) 10/11/2020    Assessment & Plan   1. Type 2 diabetes mellitus with hyperglycemia, without long-term current use of insulin (HCC) Uncontrolled-increase amaryl from 2 to 4 mg daily.  Eliminate sweets and starchy foods from your diet.  Increase physical activity - Glucose (CBG) - HgB A1c - empagliflozin (JARDIANCE) 25 MG TABS tablet; TAKE 1 TABLET (25 MG TOTAL) BY MOUTH DAILY BEFORE BREAKFAST.  Dispense: 30 tablet; Refill: 3 - metFORMIN (GLUCOPHAGE) 500 MG tablet; Take 1 tablet (500 mg total) by mouth 2 (two) times daily with a meal.  Dispense: 60 tablet; Refill: 3 - Comprehensive metabolic panel - CBC with Differential/Platelet  2. Lumbar disc disease - tiZANidine (ZANAFLEX) 4 MG tablet; Take 1 tablet (4 mg total) by mouth every 8 (eight) hours as  needed for muscle spasms.  Dispense: 60 tablet; Refill: 1  3. Essential hypertension Controlled-continue current regimen - Comprehensive metabolic panel - CBC with Differential/Platelet  4. Hyperlipidemia LDL goal <70 - rosuvastatin (CRESTOR) 40 MG tablet; Take 1 tablet (40 mg total) by mouth daily.  Dispense: 30 tablet; Refill: 3    Return in about 3 months (around 03/13/2022) for PCP for chronic conditions.  The patient was given clear instructions to go to ER or return to medical center if symptoms don't improve, worsen or new problems develop.   The patient verbalized understanding. The patient was told to call to get lab results if they haven't heard anything in the next week.      Angela McClung, PA-C [facility] [place], [place] [phone]   12/11/2021, 12:10 PM  

## 2021-12-11 NOTE — Patient Instructions (Signed)
Increase glimiperide to 4mg  daily.  Drink 80-100 ounces water daily.    Add 10 mins exercise daily.    Decrease/eliminate sugar and starchy food from your office

## 2021-12-12 ENCOUNTER — Other Ambulatory Visit: Payer: Self-pay

## 2021-12-12 LAB — CBC WITH DIFFERENTIAL/PLATELET
Basophils Absolute: 0.1 10*3/uL (ref 0.0–0.2)
Basos: 1 %
EOS (ABSOLUTE): 0.2 10*3/uL (ref 0.0–0.4)
Eos: 3 %
Hematocrit: 46.4 % (ref 37.5–51.0)
Hemoglobin: 15.8 g/dL (ref 13.0–17.7)
Immature Grans (Abs): 0 10*3/uL (ref 0.0–0.1)
Immature Granulocytes: 0 %
Lymphocytes Absolute: 2.4 10*3/uL (ref 0.7–3.1)
Lymphs: 34 %
MCH: 31.5 pg (ref 26.6–33.0)
MCHC: 34.1 g/dL (ref 31.5–35.7)
MCV: 92 fL (ref 79–97)
Monocytes Absolute: 0.6 10*3/uL (ref 0.1–0.9)
Monocytes: 9 %
Neutrophils Absolute: 3.7 10*3/uL (ref 1.4–7.0)
Neutrophils: 53 %
Platelets: 182 10*3/uL (ref 150–450)
RBC: 5.02 x10E6/uL (ref 4.14–5.80)
RDW: 14.1 % (ref 11.6–15.4)
WBC: 6.9 10*3/uL (ref 3.4–10.8)

## 2021-12-12 LAB — COMPREHENSIVE METABOLIC PANEL
ALT: 18 IU/L (ref 0–44)
AST: 13 IU/L (ref 0–40)
Albumin/Globulin Ratio: 1.7 (ref 1.2–2.2)
Albumin: 4.4 g/dL (ref 3.9–4.9)
Alkaline Phosphatase: 91 IU/L (ref 44–121)
BUN/Creatinine Ratio: 15 (ref 10–24)
BUN: 18 mg/dL (ref 8–27)
Bilirubin Total: 0.5 mg/dL (ref 0.0–1.2)
CO2: 19 mmol/L — ABNORMAL LOW (ref 20–29)
Calcium: 9.3 mg/dL (ref 8.6–10.2)
Chloride: 100 mmol/L (ref 96–106)
Creatinine, Ser: 1.22 mg/dL (ref 0.76–1.27)
Globulin, Total: 2.6 g/dL (ref 1.5–4.5)
Glucose: 154 mg/dL — ABNORMAL HIGH (ref 70–99)
Potassium: 4.3 mmol/L (ref 3.5–5.2)
Sodium: 136 mmol/L (ref 134–144)
Total Protein: 7 g/dL (ref 6.0–8.5)
eGFR: 67 mL/min/{1.73_m2} (ref >59)

## 2021-12-29 ENCOUNTER — Other Ambulatory Visit: Payer: Self-pay

## 2022-01-05 ENCOUNTER — Other Ambulatory Visit: Payer: Self-pay

## 2022-01-08 ENCOUNTER — Other Ambulatory Visit: Payer: Self-pay

## 2022-01-13 ENCOUNTER — Other Ambulatory Visit: Payer: Self-pay

## 2022-01-26 ENCOUNTER — Other Ambulatory Visit: Payer: Self-pay

## 2022-02-02 ENCOUNTER — Other Ambulatory Visit: Payer: Self-pay

## 2022-02-09 ENCOUNTER — Other Ambulatory Visit: Payer: Self-pay

## 2022-02-10 ENCOUNTER — Other Ambulatory Visit: Payer: Self-pay

## 2022-02-22 ENCOUNTER — Other Ambulatory Visit: Payer: Self-pay | Admitting: Nurse Practitioner

## 2022-02-22 DIAGNOSIS — M519 Unspecified thoracic, thoracolumbar and lumbosacral intervertebral disc disorder: Secondary | ICD-10-CM

## 2022-02-23 ENCOUNTER — Other Ambulatory Visit: Payer: Self-pay

## 2022-02-23 MED ORDER — GABAPENTIN 300 MG PO CAPS
300.0000 mg | ORAL_CAPSULE | Freq: Every day | ORAL | 2 refills | Status: DC
  Filled 2022-02-23: qty 30, 30d supply, fill #0
  Filled 2022-03-25: qty 30, 30d supply, fill #1
  Filled 2022-04-22: qty 30, 30d supply, fill #2

## 2022-02-24 ENCOUNTER — Other Ambulatory Visit: Payer: Self-pay

## 2022-03-02 ENCOUNTER — Other Ambulatory Visit: Payer: Self-pay

## 2022-03-09 ENCOUNTER — Other Ambulatory Visit: Payer: Self-pay

## 2022-03-10 ENCOUNTER — Other Ambulatory Visit: Payer: Self-pay

## 2022-03-11 ENCOUNTER — Other Ambulatory Visit: Payer: Self-pay

## 2022-03-14 ENCOUNTER — Other Ambulatory Visit: Payer: Self-pay | Admitting: Physician Assistant

## 2022-03-14 DIAGNOSIS — M519 Unspecified thoracic, thoracolumbar and lumbosacral intervertebral disc disorder: Secondary | ICD-10-CM

## 2022-03-16 ENCOUNTER — Other Ambulatory Visit: Payer: Self-pay

## 2022-03-16 MED ORDER — TIZANIDINE HCL 4 MG PO TABS
4.0000 mg | ORAL_TABLET | Freq: Three times a day (TID) | ORAL | 0 refills | Status: DC | PRN
  Filled 2022-03-16: qty 60, 20d supply, fill #0

## 2022-03-16 NOTE — Telephone Encounter (Signed)
Requested medications are due for refill today.  yes  Requested medications are on the active medications list.  yes  Last refill. 12/11/2021 #60 1 rf  Future visit scheduled.   yes  Notes to clinic.  Refill not delegated.    Requested Prescriptions  Pending Prescriptions Disp Refills   tiZANidine (ZANAFLEX) 4 MG tablet 60 tablet 1    Sig: Take 1 tablet (4 mg total) by mouth every 8 (eight) hours as needed for muscle spasms.     Not Delegated - Cardiovascular:  Alpha-2 Agonists - tizanidine Failed - 03/14/2022 11:25 AM      Failed - This refill cannot be delegated      Passed - Valid encounter within last 6 months    Recent Outpatient Visits           3 months ago Type 2 diabetes mellitus with hyperglycemia, without long-term current use of insulin [facility])   [place], [place], [place]   10 months ago Type 2 diabetes mellitus with hyperglycemia, without long-term current use of insulin ([place])   [place], Charlane Ferretti, MD   1 year ago Type 2 diabetes mellitus with hyperglycemia, without long-term current use of insulin [facility])   [place], [place] W, NP   1 year ago Type 2 diabetes mellitus with hyperglycemia, without long-term current use of insulin [facility])   [place], Vernia Buff, NP   1 year ago Encounter to establish care   [place], Vernia Buff, NP       Future Appointments             In 4 days Gildardo Pounds, NP [place]   In 2 weeks Richardson Dopp T, PA-C [place]. [facility], [facility]

## 2022-03-20 ENCOUNTER — Encounter: Payer: Self-pay | Admitting: Nurse Practitioner

## 2022-03-20 ENCOUNTER — Other Ambulatory Visit: Payer: Self-pay

## 2022-03-20 ENCOUNTER — Ambulatory Visit: Payer: Self-pay | Attending: Nurse Practitioner | Admitting: Nurse Practitioner

## 2022-03-20 VITALS — BP 102/68 | HR 80 | Temp 97.7°F | Ht 76.0 in | Wt 309.4 lb

## 2022-03-20 DIAGNOSIS — E1165 Type 2 diabetes mellitus with hyperglycemia: Secondary | ICD-10-CM

## 2022-03-20 DIAGNOSIS — J440 Chronic obstructive pulmonary disease with acute lower respiratory infection: Secondary | ICD-10-CM

## 2022-03-20 DIAGNOSIS — D696 Thrombocytopenia, unspecified: Secondary | ICD-10-CM

## 2022-03-20 DIAGNOSIS — I10 Essential (primary) hypertension: Secondary | ICD-10-CM

## 2022-03-20 DIAGNOSIS — I5032 Chronic diastolic (congestive) heart failure: Secondary | ICD-10-CM

## 2022-03-20 LAB — POCT GLYCOSYLATED HEMOGLOBIN (HGB A1C): HbA1c, POC (controlled diabetic range): 7.2 % — AB (ref 0.0–7.0)

## 2022-03-20 MED ORDER — GLIMEPIRIDE 4 MG PO TABS
8.0000 mg | ORAL_TABLET | Freq: Every day | ORAL | 3 refills | Status: DC
  Filled 2022-03-20: qty 60, 30d supply, fill #0
  Filled 2022-03-20: qty 90, 45d supply, fill #0
  Filled 2022-04-26: qty 60, 30d supply, fill #1
  Filled 2022-05-24: qty 60, 30d supply, fill #2
  Filled 2022-06-26: qty 60, 30d supply, fill #3
  Filled 2022-07-30: qty 60, 30d supply, fill #4

## 2022-03-20 MED ORDER — PREDNISONE 20 MG PO TABS
20.0000 mg | ORAL_TABLET | Freq: Every day | ORAL | 0 refills | Status: AC
  Filled 2022-03-20: qty 5, 5d supply, fill #0

## 2022-03-20 MED ORDER — DEXTROMETHORPHAN-GUAIFENESIN 10-100 MG/5ML PO LIQD
10.0000 mL | ORAL | 1 refills | Status: DC | PRN
  Filled 2022-03-20: qty 240, 4d supply, fill #0

## 2022-03-20 MED ORDER — FLUTICASONE FUROATE-VILANTEROL 200-25 MCG/ACT IN AEPB
1.0000 | INHALATION_SPRAY | Freq: Every day | RESPIRATORY_TRACT | 11 refills | Status: DC
  Filled 2022-03-20: qty 1, 1d supply, fill #0
  Filled 2022-03-27: qty 60, 30d supply, fill #0
  Filled 2022-04-24: qty 180, 90d supply, fill #1
  Filled 2022-06-22 – 2022-07-27 (×2): qty 180, 90d supply, fill #2

## 2022-03-20 NOTE — Progress Notes (Signed)
Coughing excessively with specks of blood occasionally.

## 2022-03-20 NOTE — Progress Notes (Signed)
Assessment & Plan:  Read was seen today for diabetes.  Diagnoses and all orders for this visit:  Type 2 diabetes mellitus with hyperglycemia, without long-term current use of insulin (HCC) -     POCT glycosylated hemoglobin (Hb A1C) -     glimepiride (AMARYL) 4 MG tablet; Take 2 tablets (8 mg total) by mouth daily with breakfast. Continue all other medications as prescribed. Goal A1c 6.5 or less   Thrombocytopenia (HCC) -     CBC with Differential  Chronic obstructive pulmonary disease with acute lower respiratory infection (HCC) -     CBC with Differential -     fluticasone furoate-vilanterol (BREO ELLIPTA) 200-25 MCG/ACT AEPB; Inhale 1 puff into the lungs daily. -     predniSONE (DELTASONE) 20 MG tablet; Take 1 tablet (20 mg total) by mouth daily with breakfast for 5 days. -     dextromethorphan-guaiFENesin (TUSSIN DM) 10-100 MG/5ML liquid; Take 10 mLs by mouth every 4 (four) hours as needed for cough.    Patient has been counseled on age-appropriate routine health concerns for screening and prevention. These are reviewed and up-to-date. Referrals have been placed accordingly. Immunizations are up-to-date or declined.    Subjective:   Chief Complaint  Patient presents with   Diabetes   HPI William Young 61 y.o. male presents to office today for follow up to DM. He is accompanied by his significant other today.   He has a past medical history of Abdominal aortic aneurysm, Aortic atherosclerosis, CAD (09/2014), GERD, History of echocardiogram, HLD, HTN,  Hx of NSTEMI 5/16 tx with BMS to [facility] and DES to RPLB3 (09/10/2014), Hyperglycemia, OSA, Pulmonary nodules, COPD, PAD, Tobacco use, Type II diabetes mellitus, and Varicose veins of both lower extremities.   COPD: Patient complains of cough with specks of blood x2. Symptoms began a few months ago. Symptoms cough productive of mucoid sputum in small amounts does not worsen with exertion. Sputum is clear in small amounts. Fever  has been  absent . Patient uses a few pillows at night. Respiratory history: COPD  He is a smoker and smokes a over half a ppd or 5 packs of cigarettes per week. Working at [employer] and feels the fumes may be contributing to his cough. Aggravating factors: lying down.   HTN Well controlled with chlorthalidone 25 mg daily, losartan 100 mg daily BP Readings from Last 3 Encounters:  03/20/22 102/68  12/11/21 114/77  11/27/21 130/80     DM 2 Diabetes improving. Goal is less than 7. Will increase glimepiride to 8 mg daily. Continue jardiance 25 mg daily and metformin 500 mg BID. LDL at goal. Lab Results  Component Value Date   HGBA1C 7.2 (A) 03/20/2022   Lab Results  Component Value Date   LDLCALC 46 12/03/2020     Review of Systems  Constitutional:  Negative for fever, malaise/fatigue and weight loss.  HENT: Negative.  Negative for nosebleeds.   Eyes: Negative.  Negative for blurred vision, double vision and photophobia.  Respiratory:  Positive for cough and sputum production. Negative for hemoptysis, shortness of breath and wheezing.   Cardiovascular: Negative.  Negative for chest pain, palpitations and leg swelling.  Gastrointestinal: Negative.  Negative for heartburn, nausea and vomiting.  Musculoskeletal: Negative.  Negative for myalgias.  Neurological: Negative.  Negative for dizziness, focal weakness, seizures and headaches.  Psychiatric/Behavioral: Negative.  Negative for suicidal ideas.     Past Medical History:  Diagnosis Date   Abdominal aortic aneurysm (AAA) ([place])  CT 1/22: infrarenal 3.8 cm - repeat US in 2 years // AAA [place] 2/22: AAA 3.8 cm; aorto iliac atherosclerosis without hemodynamically significant stenosis   Aortic atherosclerosis ([place])    CT in 03/2020   CAD (coronary artery disease) 09/2014   a. NSTEMI >> LHC 5/16:  mLAD 15, pRCA 90, mRCA 40, RPLB3 80, EF normal with inf HK >> PCI:  BMS to pRCA and DES to RPLB3 // Cath 11/21: Patent RCA stent, patent  RPL stent; medical therapy    GERD (gastroesophageal reflux disease)    History of echocardiogram    Echo 6/16:  EF 50-55%, no RWMA, Gr 1 DD   HLD (hyperlipidemia)    Archie Endo 03/15/2017   HTN (hypertension)    Hx of NSTEMI 5/16 tx with BMS to pRCA and DES to RPLB3 09/10/2014   Hyperglycemia    OSA (obstructive sleep apnea)    "dx'd; couldn't tolerate mask" (03/15/2017)   PAD (peripheral artery disease) ([place])    AAA [place] in 06/2020: aorto iliac atherosclerosis   Tobacco use    Type II diabetes mellitus ([place])    Varicose veins of both lower extremities    S/P ablation 02/2017    Past Surgical History:  Procedure Laterality Date   CARDIAC CATHETERIZATION N/A 09/11/2014   Procedure: Left Heart Cath and Coronary Angiography;  Surgeon: Leonie Man, MD;  Location: [facility] INVASIVE CV LAB CUPID;  Service: Cardiovascular;  Laterality: N/A;   CARDIAC CATHETERIZATION  09/11/2014   Procedure: Coronary Stent Intervention;  Surgeon: Leonie Man, MD;  Location: [facility] INVASIVE CV LAB CUPID;  Service: Cardiovascular;;   ENDOVENOUS ABLATION SAPHENOUS VEIN W/ LASER Right 02/22/2017   endovenous laser ablation R GSV and stab phlebectomy >20 incisions R leg by Tinnie Gens MD    LEFT HEART CATH AND CORONARY ANGIOGRAPHY N/A 04/01/2020   Procedure: LEFT HEART CATH AND CORONARY ANGIOGRAPHY;  Surgeon: Lorretta Harp, MD;  Location: [facility] CV LAB;  Service: Cardiovascular;  Laterality: N/A;    Family History  Problem Relation Age of Onset   Heart attack Maternal Uncle    Heart attack Paternal Grandmother    Cancer Maternal Grandfather    Heart attack Maternal Grandmother    Stroke Neg Hx    Hypertension Neg Hx     Social History Reviewed with no changes to be made today.   Outpatient Medications Prior to Visit  Medication Sig Dispense Refill   Blood Glucose Monitoring Suppl (TRUE METRIX METER) w/Device KIT Use as instructed. Check blood glucose level by fingerstick twice per day. E11.65 1 kit 0    chlorthalidone (HYGROTON) 25 MG tablet Take 1 tablet (25 mg total) by mouth daily. 90 tablet 3   clopidogrel (PLAVIX) 75 MG tablet Take 1 tablet (75 mg total) by mouth daily. 90 tablet 3   empagliflozin (JARDIANCE) 25 MG TABS tablet TAKE 1 TABLET (25 MG TOTAL) BY MOUTH DAILY BEFORE BREAKFAST. 30 tablet 3   ezetimibe (ZETIA) 10 MG tablet Take 1 tablet (10 mg total) by mouth daily. 90 tablet 3   gabapentin (NEURONTIN) 300 MG capsule Take 1 capsule (300 mg total) by mouth at bedtime. 30 capsule 2   isosorbide mononitrate (IMDUR) 30 MG 24 hr tablet Take 1 tablet (30 mg total) by mouth daily. 90 tablet 3   lidocaine (LIDODERM) 5 % Place 1 patch onto the skin daily. Remove & Discard patch within 12 hours or as directed by MD 30 patch 0   losartan (COZAAR) 100 MG  tablet TAKE 1 TABLET (100 MG TOTAL) BY MOUTH ONCE DAILY. 90 tablet 3   metFORMIN (GLUCOPHAGE) 500 MG tablet Take 1 tablet (500 mg total) by mouth 2 (two) times daily with a meal. 60 tablet 3   nitroGLYCERIN (NITROSTAT) 0.4 MG SL tablet Place 1 tablet (0.4 mg total) under the tongue every 5 (five) minutes x 3 doses as needed for chest pain. 25 tablet 12   rosuvastatin (CRESTOR) 40 MG tablet Take 1 tablet (40 mg total) by mouth daily. 30 tablet 3   tiZANidine (ZANAFLEX) 4 MG tablet Take 1 tablet (4 mg total) by mouth every 8 (eight) hours as needed for muscle spasms. 60 tablet 0   Vitamin D, Ergocalciferol, (DRISDOL) 1.25 MG (50000 UNIT) CAPS capsule Take 1 capsule (50,000 Units total) by mouth every 7 (seven) days. 4 capsule 0   glimepiride (AMARYL) 4 MG tablet Take 1 tablet (4 mg total) by mouth daily before breakfast. 30 tablet 3   atorvastatin (LIPITOR) 80 MG tablet Take 1 tablet (80 mg total) by mouth daily. 30 tablet 3   No facility-administered medications prior to visit.    Allergies  Allergen Reactions   Aspirin Anaphylaxis, Swelling and Rash    Throat closes    Ibuprofen Anaphylaxis, Swelling and Rash       Objective:    BP  102/68   Pulse 80   Temp 97.7 F (36.5 C) (Oral)   Ht _0  (1.93 m)   Wt (!) 309 lb 6.4 oz (140.3 kg)   SpO2 98%   BMI 37.66 kg/m  Wt Readings from Last 3 Encounters:  03/20/22 (!) 309 lb 6.4 oz (140.3 kg)  12/11/21 (!) 312 lb 6.4 oz (141.7 kg)  11/27/21 (!) 312 lb 3.2 oz (141.6 kg)    Physical Exam Vitals and nursing note reviewed.  Constitutional:      Appearance: He is well-developed.  HENT:     Head: Normocephalic and atraumatic.  Cardiovascular:     Rate and Rhythm: Normal rate and regular rhythm.     Heart sounds: Normal heart sounds. No murmur heard.    No friction rub. No gallop.  Pulmonary:     Effort: Pulmonary effort is normal. No tachypnea or respiratory distress.     Breath sounds: Normal breath sounds and air entry. No decreased air movement or transmitted upper airway sounds. No decreased breath sounds, wheezing, rhonchi or rales.  Chest:     Chest wall: No tenderness.  Abdominal:     General: Bowel sounds are normal.     Palpations: Abdomen is soft.  Musculoskeletal:        General: Normal range of motion.     Cervical back: Normal range of motion.  Skin:    General: Skin is warm and dry.  Neurological:     Mental Status: He is alert and oriented to person, place, and time.     Coordination: Coordination normal.  Psychiatric:        Behavior: Behavior normal. Behavior is cooperative.        Thought Content: Thought content normal.        Judgment: Judgment normal.          Patient has been counseled extensively about nutrition and exercise as well as the importance of adherence with medications and regular follow-up. The patient was given clear instructions to go to ER or return to medical center if symptoms don't improve, worsen or new problems develop. The patient verbalized understanding.  Follow-up: Return for double book 1110 next friday for f/u to cough.   Gildardo Pounds, FNP-BC [facility] and [facility], [place]   03/20/2022, 11:47 PM

## 2022-03-21 LAB — CBC WITH DIFFERENTIAL/PLATELET
Basophils Absolute: 0.1 10*3/uL (ref 0.0–0.2)
Basos: 1 %
EOS (ABSOLUTE): 0.4 10*3/uL (ref 0.0–0.4)
Eos: 5 %
Hematocrit: 41.1 % (ref 37.5–51.0)
Hemoglobin: 14.1 g/dL (ref 13.0–17.7)
Immature Grans (Abs): 0 10*3/uL (ref 0.0–0.1)
Immature Granulocytes: 0 %
Lymphocytes Absolute: 2 10*3/uL (ref 0.7–3.1)
Lymphs: 22 %
MCH: 32.4 pg (ref 26.6–33.0)
MCHC: 34.3 g/dL (ref 31.5–35.7)
MCV: 95 fL (ref 79–97)
Monocytes Absolute: 0.8 10*3/uL (ref 0.1–0.9)
Monocytes: 9 %
Neutrophils Absolute: 5.8 10*3/uL (ref 1.4–7.0)
Neutrophils: 63 %
Platelets: 166 10*3/uL (ref 150–450)
RBC: 4.35 x10E6/uL (ref 4.14–5.80)
RDW: 13.4 % (ref 11.6–15.4)
WBC: 9.1 10*3/uL (ref 3.4–10.8)

## 2022-03-23 ENCOUNTER — Other Ambulatory Visit: Payer: Self-pay

## 2022-03-26 ENCOUNTER — Other Ambulatory Visit: Payer: Self-pay

## 2022-03-27 ENCOUNTER — Encounter: Payer: Self-pay | Admitting: Nurse Practitioner

## 2022-03-27 ENCOUNTER — Ambulatory Visit: Payer: Self-pay | Attending: Nurse Practitioner | Admitting: Nurse Practitioner

## 2022-03-27 ENCOUNTER — Other Ambulatory Visit: Payer: Self-pay

## 2022-03-27 VITALS — BP 109/66 | HR 79 | Temp 98.3°F | Ht 76.0 in | Wt 306.4 lb

## 2022-03-27 DIAGNOSIS — J449 Chronic obstructive pulmonary disease, unspecified: Secondary | ICD-10-CM

## 2022-03-27 NOTE — Progress Notes (Signed)
Assessment & Plan:  Diagnoses and all orders for this visit:  Chronic obstructive pulmonary disease, unspecified COPD type ([place]) -     DG Chest 2 View; Future Start Breo inhaler today If no improvement in a few weeks with Breo inhaler will need to see the pulmonologist   Patient has been counseled on age-appropriate routine health concerns for screening and prevention. These are reviewed and up-to-date. Referrals have been placed accordingly. Immunizations are up-to-date or declined.    Subjective:   Chief Complaint  Patient presents with   COPD   HPI William Young 61 y.o. male presents to office today for follow-up to COPD   He has a past medical history of Abdominal aortic aneurysm, Aortic atherosclerosis, CAD (09/2014), GERD, History of echocardiogram, HLD, HTN,  Hx of NSTEMI 5/16 tx with BMS to [facility] and DES to RPLB3 (09/10/2014), Hyperglycemia, OSA, Pulmonary nodules, COPD, PAD, Tobacco use, Type II diabetes mellitus, and Varicose veins of both lower extremities.    COPD:  I saw him a few weeks ago and at that time he had complaints of cough with specks of blood x2. Symptoms began a few months prior.  Cough was described as productive of mucoid sputum in small amounts not worsening with exertion. Sputum clear in small amounts. Fever absent . Patient uses a few pillows at night. Respiratory history: COPD  He is a smoker and smokes a over half a ppd or 5 packs of cigarettes per week. Working at [employer] and feels the fumes may be contributing to his cough. Aggravating factors: lying down.  At the time of his last visit 2 weeks ago I prescribed him [facility] however today patient states that he was unable to pick up the [facility] due to it not being available.  I did speak with the pharmacy staff today and we were able to give him a Breo inhaler to start.  He has already filled out the paperwork for the patient assistance program for this inhaler. Today he states cough is persistent.  White  blood count normal.  Will order chest x-ray today.  Review of Systems  Constitutional:  Negative for fever, malaise/fatigue and weight loss.  HENT: Negative.  Negative for nosebleeds.   Eyes: Negative.  Negative for blurred vision, double vision and photophobia.  Respiratory:  Positive for cough and sputum production. Negative for hemoptysis, shortness of breath and wheezing.   Cardiovascular: Negative.  Negative for chest pain, palpitations and leg swelling.  Gastrointestinal: Negative.  Negative for heartburn, nausea and vomiting.  Musculoskeletal: Negative.  Negative for myalgias.  Neurological: Negative.  Negative for dizziness, focal weakness, seizures and headaches.  Psychiatric/Behavioral: Negative.  Negative for suicidal ideas.     Past Medical History:  Diagnosis Date   Abdominal aortic aneurysm (AAA) ([place])    CT 1/22: infrarenal 3.8 cm - repeat US in 2 years // AAA [place] 2/22: AAA 3.8 cm; aorto iliac atherosclerosis without hemodynamically significant stenosis   Aortic atherosclerosis ([place])    CT in 03/2020   CAD (coronary artery disease) 09/2014   a. NSTEMI >> LHC 5/16:  mLAD 15, pRCA 90, mRCA 40, RPLB3 80, EF normal with inf HK >> PCI:  BMS to pRCA and DES to RPLB3 // Cath 11/21: Patent RCA stent, patent RPL stent; medical therapy    GERD (gastroesophageal reflux disease)    History of echocardiogram    Echo 6/16:  EF 50-55%, no RWMA, Gr 1 DD   HLD (hyperlipidemia)    Archie Endo  03/15/2017   HTN (hypertension)    Hx of NSTEMI 5/16 tx with BMS to [facility] and DES to RPLB3 09/10/2014   Hyperglycemia    OSA (obstructive sleep apnea)    "dx'd; couldn't tolerate mask" (03/15/2017)   PAD (peripheral artery disease) ([place])    AAA [place] in 06/2020: aorto iliac atherosclerosis   Tobacco use    Type II diabetes mellitus ([place])    Varicose veins of both lower extremities    S/P ablation 02/2017    Past Surgical History:  Procedure Laterality Date   CARDIAC CATHETERIZATION N/A 09/11/2014    Procedure: Left Heart Cath and Coronary Angiography;  Surgeon: Leonie Man, MD;  Location: [facility] INVASIVE CV LAB CUPID;  Service: Cardiovascular;  Laterality: N/A;   CARDIAC CATHETERIZATION  09/11/2014   Procedure: Coronary Stent Intervention;  Surgeon: Leonie Man, MD;  Location: [facility] INVASIVE CV LAB CUPID;  Service: Cardiovascular;;   ENDOVENOUS ABLATION SAPHENOUS VEIN W/ LASER Right 02/22/2017   endovenous laser ablation R GSV and stab phlebectomy >20 incisions R leg by Tinnie Gens MD    LEFT HEART CATH AND CORONARY ANGIOGRAPHY N/A 04/01/2020   Procedure: LEFT HEART CATH AND CORONARY ANGIOGRAPHY;  Surgeon: Lorretta Harp, MD;  Location: [place] CV LAB;  Service: Cardiovascular;  Laterality: N/A;    Family History  Problem Relation Age of Onset   Heart attack Maternal Uncle    Heart attack Paternal Grandmother    Cancer Maternal Grandfather    Heart attack Maternal Grandmother    Stroke Neg Hx    Hypertension Neg Hx     Social History Reviewed with no changes to be made today.   Outpatient Medications Prior to Visit  Medication Sig Dispense Refill   Blood Glucose Monitoring Suppl (TRUE METRIX METER) w/Device KIT Use as instructed. Check blood glucose level by fingerstick twice per day. E11.65 1 kit 0   chlorthalidone (HYGROTON) 25 MG tablet Take 1 tablet (25 mg total) by mouth daily. 90 tablet 3   clopidogrel (PLAVIX) 75 MG tablet Take 1 tablet (75 mg total) by mouth daily. 90 tablet 3   dextromethorphan-guaiFENesin (TUSSIN DM) 10-100 MG/5ML liquid Take 10 mLs by mouth every 4 (four) hours as needed for cough. 240 mL 1   empagliflozin (JARDIANCE) 25 MG TABS tablet TAKE 1 TABLET (25 MG TOTAL) BY MOUTH DAILY BEFORE BREAKFAST. 30 tablet 3   ezetimibe (ZETIA) 10 MG tablet Take 1 tablet (10 mg total) by mouth daily. 90 tablet 3   fluticasone furoate-vilanterol (BREO ELLIPTA) 200-25 MCG/ACT AEPB Inhale 1 puff into the lungs daily. 60 each 11   gabapentin (NEURONTIN) 300 MG capsule  Take 1 capsule (300 mg total) by mouth at bedtime. 30 capsule 2   glimepiride (AMARYL) 4 MG tablet Take 2 tablets (8 mg total) by mouth daily with breakfast. 90 tablet 3   isosorbide mononitrate (IMDUR) 30 MG 24 hr tablet Take 1 tablet (30 mg total) by mouth daily. 90 tablet 3   lidocaine (LIDODERM) 5 % Place 1 patch onto the skin daily. Remove & Discard patch within 12 hours or as directed by MD 30 patch 0   losartan (COZAAR) 100 MG tablet TAKE 1 TABLET (100 MG TOTAL) BY MOUTH ONCE DAILY. 90 tablet 3   metFORMIN (GLUCOPHAGE) 500 MG tablet Take 1 tablet (500 mg total) by mouth 2 (two) times daily with a meal. 60 tablet 3   nitroGLYCERIN (NITROSTAT) 0.4 MG SL tablet Place 1 tablet (0.4 mg total) under the  tongue every 5 (five) minutes x 3 doses as needed for chest pain. 25 tablet 12   rosuvastatin (CRESTOR) 40 MG tablet Take 1 tablet (40 mg total) by mouth daily. 30 tablet 3   tiZANidine (ZANAFLEX) 4 MG tablet Take 1 tablet (4 mg total) by mouth every 8 (eight) hours as needed for muscle spasms. 60 tablet 0   Vitamin D, Ergocalciferol, (DRISDOL) 1.25 MG (50000 UNIT) CAPS capsule Take 1 capsule (50,000 Units total) by mouth every 7 (seven) days. 4 capsule 0   No facility-administered medications prior to visit.    Allergies  Allergen Reactions   Aspirin Anaphylaxis, Swelling and Rash    Throat closes    Ibuprofen Anaphylaxis, Swelling and Rash       Objective:    BP 109/66   Pulse 79   Temp 98.3 F (36.8 C) (Oral)   Ht _0  (1.93 m)   Wt (!) 306 lb 6.4 oz (139 kg)   SpO2 98%   BMI 37.30 kg/m  Wt Readings from Last 3 Encounters:  03/27/22 (!) 306 lb 6.4 oz (139 kg)  03/20/22 (!) 309 lb 6.4 oz (140.3 kg)  12/11/21 (!) 312 lb 6.4 oz (141.7 kg)    Physical Exam Vitals and nursing note reviewed.  Constitutional:      Appearance: He is well-developed.  HENT:     Head: Normocephalic and atraumatic.  Cardiovascular:     Rate and Rhythm: Normal rate and regular rhythm.     Heart  sounds: Normal heart sounds. No murmur heard.    No friction rub. No gallop.  Pulmonary:     Effort: Pulmonary effort is normal. No tachypnea or respiratory distress.     Breath sounds: Normal breath sounds. No stridor, decreased air movement or transmitted upper airway sounds. No decreased breath sounds, wheezing, rhonchi or rales.  Chest:     Chest wall: No tenderness.  Abdominal:     General: Bowel sounds are normal.     Palpations: Abdomen is soft.  Musculoskeletal:        General: Normal range of motion.     Cervical back: Normal range of motion.  Skin:    General: Skin is warm and dry.  Neurological:     Mental Status: He is alert and oriented to person, place, and time.     Coordination: Coordination normal.  Psychiatric:        Behavior: Behavior normal. Behavior is cooperative.        Thought Content: Thought content normal.        Judgment: Judgment normal.          Patient has been counseled extensively about nutrition and exercise as well as the importance of adherence with medications and regular follow-up. The patient was given clear instructions to go to ER or return to medical center if symptoms don't improve, worsen or new problems develop. The patient verbalized understanding.   Follow-up: Return in about 3 months (around 06/27/2022).   Gildardo Pounds, FNP-BC [facility] and [facility], [place]   03/27/2022, 1:15 PM

## 2022-03-27 NOTE — Progress Notes (Signed)
Cough still the same. No blood.

## 2022-03-29 NOTE — Progress Notes (Unsigned)
Cardiology Office Note:    Date:  03/30/2022   ID:  William Young, DOB 08-05-1960, MRN [id number]  PCP:  William Pounds, NP  [facility] Cardiologist:  William Carnes, [facility] Cardiology APP:  William Young    Referring Young: William Pounds, NP   Chief Complaint:  F/u for CAD    Patient Profile: Coronary artery disease S/p NSTEMI in 5/16 >> PCI: BMS to pRCA; DES to RPLB3 No sig CAD elsewhere (LAD 15) Myoview 11/18: no ischemia  Echocardiogram 11/18: normal EF, inf-lat HK ASA allergy Cath 11/21: Patent RCA stent, patent RPL stent; medical therapy Myoview 09/23/2021: No ischemia or infarction, EF 55, low risk Echo 09/22/2021: EF 50-55, no RWMA, normal RVSF, TR signal and adequate for assessing PAP Aortic atherosclerosis  Abdominal aortic aneurysm  CT 1/22: infrarenal 3.8 cm - repeat US in 2 years  CT 01/2021: 3.7 cm  [place] 07/2021: 3.8 cm  Peripheral arterial disease  LE arterial US 08/15/2021: Proximal R SFA 30-49; proximal L CFA 30-49, ostial and mid L SFA 30-49 Dr. Gwenlyn Young - med Rx  Hx of mild Heart failure with preserved ejection fraction  Diabetes mellitus  Hypertension  Hyperlipidemia  Tobacco abuse  Chronic Obstructive Pulmonary Disease  Dr. Lamonte Young OSA Lung nodules CT 1/22: RUL 6 mm; RML 3 mm   Venous insufficiency S/p R GSV laser ablation (Dr. Kellie Young) in 2018   Cardiac Studies & Procedures   [place] 04/01/2020  Narrative Images from the original result were not included.   Previously placed Ost RCA to Prox RCA stent (unknown type) is widely patent.  Prox RCA to Mid RCA lesion is 40% stenosed.  Previously placed 1st RPL stent (unknown type) is widely patent.  RPDA lesion is 30% stenosed.  William Young is a 61 y.o. male   [id number] LOCATION:  FACILITY: [facility] PHYSICIAN: William Young, M.D. 1961-02-25   DATE OF PROCEDURE:  04/01/2020  DATE OF DISCHARGE:     CARDIAC  CATHETERIZATION    History obtained from chart review.William Young is a 61 y.o. male with a history of CAD, DM, GERD, HTN, HL In 09/2014 he had a NSTEMI  LHC done which showed single vessel CAD with 90% prox RCA and 80^ R PLSA.  Underwent PCI/BMS to prox RCA and resolute DES to RPLB3.  Hx of ASA allergy so treated  With Brilinta for 1 year then continued on plavix.  Pt admitted in Nov 2018 with CP  Myovue post discharge showed no ischemia   LVEF 38%  Echo showed LVEF 55 to 60^ with inferolateral hypokinesis I saw the pt in Jan 2021 Pt says he has been had chest tightness  Not every day   DId have this am      Pt is getting more SOB with doing things  Even with mild acitity    Getting tightness a couple times  per week  Good days and bad days   Feels like what happened prior cath in 2016.  Based on this, he was referred to outpatient diagnostic cath to define his anatomy.  Impression Mr. Whitesel RCA stents were widely patent.  He has no disease in his left system.  I believe his chest pain is noncardiac.  He did have some radial artery spasm on sheath removal which resolved with administration of radial cocktail.  I did give him 2 g of IV Ancef after this.  Medical therapy will be recommended for noncardiac chest pain.  He will be discharged home later today and will follow up with Dr. Dorris Young as an outpatient.  He left the lab in stable condition.  William Young, [facility] 04/01/2020 2:04 PM  Findings Coronary Findings Diagnostic  Dominance: Right  Right Coronary Artery Previously placed Ost RCA to Prox RCA stent (unknown type) is widely patent. Prox RCA to Mid RCA lesion is 40% stenosed.  Right Posterior Descending Artery RPDA lesion is 30% stenosed.  First Right Posterolateral Branch Previously placed 1st RPL stent (unknown type) is widely patent.  Intervention  No interventions have been documented.   CARDIAC CATHETERIZATION  CARDIAC CATHETERIZATION  09/11/2014  Narrative Images from the original result were not included.  Severe 2 site CAD of the prox RCA and RPL1 (noted as RPL3) - both sites treated with PCI: BMS prox RCA, DES RPL  Prox RCA lesion, 90% stenosed. There is a 0% residual stenosis post intervention.  A bare metal stent was placed. MultiLink Vision BMS 5.0 mm x 18 mm (5.2 mm)  A drug-eluting stent was placed. Resolute DES 2.5 mm x 14 mm (2.8 mm)  Normal LV Function & LVEDP  3rd RPLB lesion, 80% stenosed. There is a 0% residual stenosis post intervention.  NSTEMI - CAD s/p PCI to 2 Lesions in the RCA system ASA Anaphylaxis history  Recommend: Standard post Radial PCI care with TR Band removal With ASA allergy - limited to Brilinta alone --> recommend 1 year minimum (would convert to Plavix at 6-12 months for continued coverage) Continue aggressive Cardiac Risk Factor modification - BP, lipid & glycemic control along with smoking cessation counseling & wgt loss. Care Management Consult for [place] Discharge in AM if stable.    William Young, M.D., M.S. Interventional Cardiologist  Findings Coronary Findings Diagnostic  Dominance: Right  Left Main The vessel was , is large and very large .  Left Anterior Descending The vessel was , is large . tubular located at bend.  First Diagonal Branch The vessel is moderate in size.  Second Diagonal Branch The vessel is moderate in size.  Third Diagonal Branch The vessel is small in size.  Ramus Intermedius The vessel was , is moderate in size . High OM - Ramus  Left Circumflex The vessel was , is moderate in size .  First Obtuse Marginal Branch The vessel is moderate in size.  Right Coronary Artery The vessel was , is large and very large . The vessel is tortuous. The lesion is type C, with heavy thrombus, ulcerative located at bend. eccentric located at bend.  Right Posterior Descending Artery The vessel is moderate in  size.  First Right Posterolateral Branch The vessel is moderate in size.  Second Right Posterolateral Branch The vessel is moderate in size.  Third Right Posterolateral Branch The vessel is moderate in size. The lesion is type non-C, tubular .  Intervention  Prox RCA lesion PCI The pre-interventional distal flow is normal (TIMI 3). Pre-stent angioplasty was performed. A bare metal stent was placed. Minimum lumen area: 5.2 mm. The strut is apposed. Post-stent angioplasty was performed. Lesion length: 15 mm. The post-interventional distal flow is normal (TIMI 3). The intervention was successful. No complications occurred at this lesion. 5.0 mm x 18 mm BMS - post-dialted to 5.2 mm Supplies used: BALLN [place] RX 3.0X15; STENT MULTI LINK ULTRA 5.0X18; BALLN [place] RX 5.0X15 There is a 0% residual stenosis post intervention.  3rd RPL lesion PCI The pre-interventional distal flow is normal (  TIMI 3). Pre-stent angioplasty was performed. A drug-eluting stent was placed. Minimum lumen area: 2.8 mm. The strut is apposed. Post-stent angioplasty was performed. Lesion length: 12 mm. Maximum pressure: 18 atm. The post-interventional distal flow is normal (TIMI 3). The intervention was successful. No complications occurred at this lesion. 2.5 mm x 14 mm DES - post-dilated to 2.75-2.8 mm JR4 Guide, [employer] Wire Supplies used: BALLN [place] RX 2.0X12 There is a 0% residual stenosis post intervention.   STRESS TESTS  MYOCARDIAL PERFUSION IMAGING 09/23/2021  Narrative   The study is normal. Findings are consistent with no prior ischemia and no prior myocardial infarction. The study is low risk.   No ST deviation was noted.   LV perfusion is normal. There is no evidence of ischemia. There is no evidence of infarction.   Left ventricular function is normal. Nuclear stress EF: 47 %. The left ventricular ejection fraction is mildly decreased (45-54%). End diastolic cavity size is normal. End systolic  cavity size is normal.   Prior study available for comparison from 04/06/2017.  The previously noted perfusion defects are no longer present.   ECHOCARDIOGRAM  ECHOCARDIOGRAM COMPLETE 09/22/2021  Narrative ECHOCARDIOGRAM REPORT    Patient Name:   William Young Date of Exam: 09/22/2021 Medical Rec #:  [id number]       Height:       75.0 in Accession #:    [id number]      Weight:       309.0 lb Date of Birth:  06-18-1960        BSA:          2.640 m Patient Age:    59 years        BP:           133/76 mmHg Patient Gender: M               HR:           69 bpm. Exam Location:  [facility]  Procedure: 2D Echo, Cardiac Doppler, Color Doppler and Strain Analysis  Indications:    R06.02 SOB  History:        Patient has prior history of Echocardiogram examinations, most recent 04/09/2017. CAD, AAA and PAD; Risk Factors:Hypertension, Diabetes, Sleep Apnea and Current Smoker.  Sonographer:    Marygrace Drought RCS Referring Phys: Juda   1. Left ventricular ejection fraction, by estimation, is 50 to 55%. The left ventricle has low normal function. The left ventricle has no regional wall motion abnormalities. Left ventricular diastolic parameters were normal. 2. Right ventricular systolic function is normal. The right ventricular size is normal. Tricuspid regurgitation signal is inadequate for assessing PA pressure. 3. The mitral valve is grossly normal. No evidence of mitral valve regurgitation. No evidence of mitral stenosis. 4. The aortic valve is tricuspid. Aortic valve regurgitation is not visualized. No aortic stenosis is present. 5. The inferior vena cava is normal in size with greater than 50% respiratory variability, suggesting right atrial pressure of 3 mmHg.  Comparison(s): No significant change from prior study.  FINDINGS Left Ventricle: Left ventricular ejection fraction, by estimation, is 50 to 55%. The left ventricle has low normal function. The  left ventricle has no regional wall motion abnormalities. Global longitudinal strain performed but not reported based on interpreter judgement due to suboptimal tracking. The left ventricular internal cavity size was normal in size. There is no left ventricular hypertrophy. Left ventricular diastolic parameters were normal.  Right Ventricle: The  right ventricular size is normal. No increase in right ventricular wall thickness. Right ventricular systolic function is normal. Tricuspid regurgitation signal is inadequate for assessing PA pressure.  Left Atrium: Left atrial size was normal in size.  Right Atrium: Right atrial size was normal in size.  Pericardium: There is no evidence of pericardial effusion.  Mitral Valve: The mitral valve is grossly normal. No evidence of mitral valve regurgitation. No evidence of mitral valve stenosis.  Tricuspid Valve: The tricuspid valve is grossly normal. Tricuspid valve regurgitation is trivial. No evidence of tricuspid stenosis.  Aortic Valve: The aortic valve is tricuspid. Aortic valve regurgitation is not visualized. No aortic stenosis is present.  Pulmonic Valve: The pulmonic valve was grossly normal. Pulmonic valve regurgitation is not visualized. No evidence of pulmonic stenosis.  Aorta: The aortic root and ascending aorta are structurally normal, with no evidence of dilitation.  Venous: The right lower pulmonary vein is normal. The inferior vena cava is normal in size with greater than 50% respiratory variability, suggesting right atrial pressure of 3 mmHg.  IAS/Shunts: The atrial septum is grossly normal.   LEFT VENTRICLE PLAX 2D LVIDd:         5.40 cm   Diastology LVIDs:         3.90 cm   LV e' medial:    6.96 cm/s LV PW:         1.00 cm   LV E/e' medial:  8.9 LV IVS:        0.90 cm   LV e' lateral:   10.20 cm/s LVOT diam:     2.40 cm   LV E/e' lateral: 6.1 LV SV:         99 LV SV Index:   37        2D Longitudinal Strain LVOT Area:      4.52 cm  2D Strain GLS (A2C):   -15.7 % 2D Strain GLS (A3C):   -21.0 % 2D Strain GLS (A4C):   -17.1 % 2D Strain GLS Avg:     -18.0 %  RIGHT VENTRICLE RV Basal diam:  3.00 cm RV S prime:     11.30 cm/s  LEFT ATRIUM             Index        RIGHT ATRIUM           Index LA diam:        3.60 cm 1.36 cm/m   RA Area:     13.60 cm LA Vol (A2C):   55.7 ml 21.10 ml/m  RA Volume:   28.90 ml  10.95 ml/m LA Vol (A4C):   33.5 ml 12.69 ml/m LA Biplane Vol: 43.8 ml 16.59 ml/m AORTIC VALVE LVOT Vmax:   106.00 cm/s LVOT Vmean:  69.800 cm/s LVOT VTI:    0.218 m  AORTA Ao Root diam: 3.40 cm Ao Asc diam:  3.60 cm  MITRAL VALVE MV Area (PHT):             SHUNTS MV Decel Time:             Systemic VTI:  0.22 m MV E velocity: 61.80 cm/s  Systemic Diam: 2.40 cm MV A velocity: 58.20 cm/s MV E/A ratio:  1.06  Eleonore Chiquito Young Electronically signed by Eleonore Chiquito Young Signature Date/Time: 09/22/2021/11:38:18 AM    Final              History of Present Illness:   Revis Whalin is a 61  y.o. male with the above problem list.  He was last seen 09/30/21. He returns for f/u. He is here with his wife. He just got off work from [employer]. He is tired and wants to go home to bed. He has not had syncope. He has not had chest pain, orthopnea, paroxysmal nocturnal dyspnea. He has chronic shortness of breath that is unchanged. He has been coughing for the past month. No fevers or purulent sputum. His PCP put him on Breo recently. CXR is pending.          Past Medical History:  Diagnosis Date   Abdominal aortic aneurysm (AAA) ([place])    CT 1/22: infrarenal 3.8 cm - repeat US in 2 years // AAA [place] 2/22: AAA 3.8 cm; aorto iliac atherosclerosis without hemodynamically significant stenosis   Aortic atherosclerosis ([place])    CT in 03/2020   CAD (coronary artery disease) 09/2014   a. NSTEMI >> LHC 5/16:  mLAD 15, pRCA 90, mRCA 40, RPLB3 80, EF normal with inf HK >> PCI:  BMS to pRCA and DES to RPLB3 //  Cath 11/21: Patent RCA stent, patent RPL stent; medical therapy    GERD (gastroesophageal reflux disease)    History of echocardiogram    Echo 6/16:  EF 50-55%, no RWMA, Gr 1 DD   HLD (hyperlipidemia)    Archie Endo 03/15/2017   HTN (hypertension)    Hx of NSTEMI 5/16 tx with BMS to pRCA and DES to RPLB3 09/10/2014   Hyperglycemia    OSA (obstructive sleep apnea)    "dx'd; couldn't tolerate mask" (03/15/2017)   PAD (peripheral artery disease) ([place])    AAA [place] in 06/2020: aorto iliac atherosclerosis   Tobacco use    Type II diabetes mellitus ([place])    Varicose veins of both lower extremities    S/P ablation 02/2017   Current Medications: Current Meds  Medication Sig   Blood Glucose Monitoring Suppl (TRUE METRIX METER) w/Device KIT Use as instructed. Check blood glucose level by fingerstick twice per day. E11.65   chlorthalidone (HYGROTON) 25 MG tablet Take 0.5 tablets (12.5 mg total) by mouth daily.   clopidogrel (PLAVIX) 75 MG tablet Take 1 tablet (75 mg total) by mouth daily.   dextromethorphan-guaiFENesin (TUSSIN DM) 10-100 MG/5ML liquid Take 10 mLs by mouth every 4 (four) hours as needed for cough.   empagliflozin (JARDIANCE) 25 MG TABS tablet TAKE 1 TABLET (25 MG TOTAL) BY MOUTH DAILY BEFORE BREAKFAST.   ezetimibe (ZETIA) 10 MG tablet Take 1 tablet (10 mg total) by mouth daily.   fluticasone furoate-vilanterol (BREO ELLIPTA) 200-25 MCG/ACT AEPB Inhale 1 puff into the lungs daily.   gabapentin (NEURONTIN) 300 MG capsule Take 1 capsule (300 mg total) by mouth at bedtime.   glimepiride (AMARYL) 4 MG tablet Take 2 tablets (8 mg total) by mouth daily with breakfast.   isosorbide mononitrate (IMDUR) 30 MG 24 hr tablet Take 1 tablet (30 mg total) by mouth daily.   lidocaine (LIDODERM) 5 % Place 1 patch onto the skin daily. Remove & Discard patch within 12 hours or as directed by Young   losartan (COZAAR) 50 MG tablet Take 1 tablet (50 mg total) by mouth daily.   metFORMIN (GLUCOPHAGE) 500 MG tablet  Take 1 tablet (500 mg total) by mouth 2 (two) times daily with a meal.   nitroGLYCERIN (NITROSTAT) 0.4 MG SL tablet Place 1 tablet (0.4 mg total) under the tongue every 5 (five) minutes x 3 doses as needed for chest  pain.   rosuvastatin (CRESTOR) 40 MG tablet Take 1 tablet (40 mg total) by mouth daily.   tiZANidine (ZANAFLEX) 4 MG tablet Take 1 tablet (4 mg total) by mouth every 8 (eight) hours as needed for muscle spasms.   Vitamin D, Ergocalciferol, (DRISDOL) 1.25 MG (50000 UNIT) CAPS capsule Take 1 capsule (50,000 Units total) by mouth every 7 (seven) days.   [DISCONTINUED] chlorthalidone (HYGROTON) 25 MG tablet Take 1 tablet (25 mg total) by mouth daily.   [DISCONTINUED] losartan (COZAAR) 100 MG tablet TAKE 1 TABLET (100 MG TOTAL) BY MOUTH ONCE DAILY.    Allergies:   Aspirin and Ibuprofen   Social History   Occupational History   Occupation: Architect purification systems  Tobacco Use   Smoking status: Every Day    Packs/day: 1.00    Years: 40.00    Total pack years: 40.00    Types: Cigarettes    Start date: 1978   Smokeless tobacco: Former    Types: Chew   Tobacco comments:    Smokes 5 packs a week. [place] 11/27/21  Vaping Use   Vaping Use: Never used  Substance and Sexual Activity   Alcohol use: Yes    Alcohol/week: 6.0 standard drinks of alcohol    Types: 6 Cans of beer per week   Drug use: No   Sexual activity: Not Currently    Family Hx: The patient's family history includes Cancer in his maternal grandfather; Heart attack in his maternal grandmother, maternal uncle, and paternal grandmother. There is no history of Stroke or Hypertension.  Review of Systems  Constitutional: Negative for chills and fever.  Respiratory:  Positive for cough.   Gastrointestinal:  Negative for diarrhea, hematochezia, melena and vomiting.  Genitourinary:  Negative for hematuria.     EKGs/Labs/Other Test Reviewed:    EKG:  EKG is  ordered today.  The ekg ordered today demonstrates  normal sinus rhythm, HR 91, LAD, PRWP, no ST-TW changes, QTc 450 ms, PVC   Recent Labs: 09/03/2021: NT-Pro BNP 243 12/11/2021: ALT 18; BUN 18; Creatinine, Ser 1.22; Potassium 4.3; Sodium 136 03/20/2022: Hemoglobin 14.1; Platelets 166   Recent Lipid Panel No results for input(s): "CHOL", "TRIG", "HDL", "VLDL", "LDLCALC", "LDLDIRECT" in the last 8760 hours.    Risk Assessment/Calculations/Metrics:              Physical Exam:    VS:  BP 90/60   Pulse 94   Ht _0  (1.93 m)   Wt (!) 303 lb 3.2 oz (137.5 kg)   SpO2 96%   BMI 36.91 kg/m     Wt Readings from Last 3 Encounters:  03/30/22 (!) 303 lb 3.2 oz (137.5 kg)  03/27/22 (!) 306 lb 6.4 oz (139 kg)  03/20/22 (!) 309 lb 6.4 oz (140.3 kg)    Constitutional:      Appearance: Healthy appearance. Not in distress.  Neck:     Vascular: JVD normal.     Lymphadenopathy: No cervical adenopathy.  Pulmonary:     Effort: Pulmonary effort is normal.     Breath sounds: No wheezing. No rales.  Cardiovascular:     Normal rate. Regular rhythm. Normal S1. Normal S2.      Murmurs: There is no murmur.  Edema:    Peripheral edema present.    Pretibial: bilateral trace edema of the pretibial area. Abdominal:     Palpations: Abdomen is soft.  Skin:    General: Skin is warm and dry.  Neurological:  General: No focal deficit present.     Mental Status: Alert and oriented to person, place and time.          ASSESSMENT & PLAN:   Hypotension His blood pressure is pretty low today.  He has had a cough for the past month.  He was supposed to get a chest x-ray but has not yet done so.  His lungs are clear on exam.  He just got off of work.  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  After leaving work, he went home and took all of his medications, including Glimepiride.  He has not eaten yet.  We checked his sugar in the office and it was 200.  In review of his chart, he has had systolic blood  pressure readings in the low 100s last several visits.  I suspect his low blood pressure is combination of medication, possible dehydration from chronic cough and fatigue.  He does not appear toxic and I do not think he needs to go to the emergency room at this time.  I did warn him that if his symptoms should change, he should go to the emergency room.   I have encouraged him to push fluids for the rest of the day and liberalize salt intake for today.   Decrease chlorthalidone to 12.5 mg daily and decrease losartan to 50 mg daily.   Obtain CMET, CBC with differential.   I encouraged him to get his chest x-ray.   Check blood pressure later today and call if systolic less than 90.   Follow-up in 3 to 4 weeks.  Coronary artery disease involving native coronary artery with angina pectoris ([place]) History non-STEMI in 2016 treated a BMS to the RCA and DES to the RPL branch 3.  Myoview in May 2023 was low risk.  He is doing well without anginal symptoms.  As noted, his blood pressure is running low and I am adjusting his losartan and chlorthalidone as outlined.  Continue Plavix and 5 mg daily, Zetia 10 mg daily, Crestor 40 mg daily, isosorbide mononitrate 30 mg daily.  Hyperlipidemia LDL goal <70 LDL in July 2022 was 46 continue Zetia 10 mg daily, Crestor 40 mg daily.  Aortic atherosclerosis ([facility]) Continue Plavix 75 mg once daily, Crestor 40 mg once daily.   COPD (chronic obstructive pulmonary disease) ([place]) Pt on Breo now. He has had cough and congestion for the past month. Primary care is considering getting him back to Pulmonology sooner. As noted, I have encourage him to get his CXR today.              Dispo:  Return in about 4 weeks (around 04/27/2022) for Routine Follow Up, w/ Dr. Harrington Challenger, or Richardson Dopp, PA-C.   Medication Adjustments/Labs and Tests Ordered: Current medicines are reviewed at length with the patient today.  Concerns regarding medicines are outlined above.  Tests  Ordered: Orders Placed This Encounter  Procedures   CBC w/Diff   Comp Met (CMET)   EKG 12-Lead   Medication Changes: Meds ordered this encounter  Medications   losartan (COZAAR) 50 MG tablet    Sig: Take 1 tablet (50 mg total) by mouth daily.    Dispense:  90 tablet    Refill:  3   chlorthalidone (HYGROTON) 25 MG tablet    Sig: Take 0.5 tablets (12.5 mg total) by mouth daily.    Dispense:  45  tablet    Refill:  3   Signed, Richardson Dopp, PA-C  03/30/2022 10:18 AM    [facility] [place], [place], [place]  [address] Phone: [phone]; Fax: [phone]

## 2022-03-30 ENCOUNTER — Ambulatory Visit
Admission: RE | Admit: 2022-03-30 | Discharge: 2022-03-30 | Disposition: A | Discharge status: Home / Self Care | Payer: Self-pay | Source: Ambulatory Visit | Attending: Nurse Practitioner | Admitting: Nurse Practitioner

## 2022-03-30 ENCOUNTER — Other Ambulatory Visit: Payer: Self-pay

## 2022-03-30 ENCOUNTER — Ambulatory Visit: Payer: Self-pay | Attending: Physician Assistant | Admitting: Physician Assistant

## 2022-03-30 ENCOUNTER — Encounter: Payer: Self-pay | Admitting: Physician Assistant

## 2022-03-30 VITALS — BP 90/60 | HR 94 | Ht 76.0 in | Wt 303.2 lb

## 2022-03-30 DIAGNOSIS — J449 Chronic obstructive pulmonary disease, unspecified: Secondary | ICD-10-CM

## 2022-03-30 DIAGNOSIS — E785 Hyperlipidemia, unspecified: Secondary | ICD-10-CM

## 2022-03-30 DIAGNOSIS — I25119 Atherosclerotic heart disease of native coronary artery with unspecified angina pectoris: Secondary | ICD-10-CM

## 2022-03-30 DIAGNOSIS — I700 Atherosclerosis of aorta: Secondary | ICD-10-CM

## 2022-03-30 DIAGNOSIS — I959 Hypotension, unspecified: Secondary | ICD-10-CM | POA: Insufficient documentation

## 2022-03-30 DIAGNOSIS — I9589 Other hypotension: Secondary | ICD-10-CM

## 2022-03-30 MED ORDER — LOSARTAN POTASSIUM 50 MG PO TABS
50.0000 mg | ORAL_TABLET | Freq: Every day | ORAL | 3 refills | Status: DC
  Filled 2022-03-30: qty 90, 90d supply, fill #0

## 2022-03-30 MED ORDER — CHLORTHALIDONE 25 MG PO TABS: 12.5000 mg | ORAL_TABLET | Freq: Every day | ORAL | 3 refills | Status: DC

## 2022-03-30 NOTE — Assessment & Plan Note (Signed)
LDL in July 2022 was 46 continue Zetia 10 mg daily, Crestor 40 mg daily.

## 2022-03-30 NOTE — Addendum Note (Signed)
Addended by: Burnetta Sabin on: 03/30/2022 10:37 AM   Modules accepted: Orders

## 2022-03-30 NOTE — Assessment & Plan Note (Signed)
History non-STEMI in 2016 treated a BMS to the RCA and DES to the RPL branch 3.  Myoview in May 2023 was low risk.  He is doing well without anginal symptoms.  As noted, his blood pressure is running low and I am adjusting his losartan and chlorthalidone as outlined.  Continue Plavix and 5 mg daily, Zetia 10 mg daily, Crestor 40 mg daily, isosorbide mononitrate 30 mg daily.

## 2022-03-30 NOTE — Assessment & Plan Note (Signed)
Pt on Breo now. He has had cough and congestion for the past month. Primary care is considering getting him back to Pulmonology sooner. As noted, I have encourage him to get his CXR today.

## 2022-03-30 NOTE — Assessment & Plan Note (Signed)
His blood pressure is pretty low today.  He has had a cough for the past month.  He was supposed to get a chest x-ray but has not yet done so.  His lungs are clear on exam.  He just got off of work.  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  After leaving work, he went home and took all of his medications, including Glimepiride.  He has not eaten yet.  We checked his sugar in the office and it was 200.  In review of his chart, he has had systolic blood pressure readings in the low 100s last several visits.  I suspect his low blood pressure is combination of medication, possible dehydration from chronic cough and fatigue.  He does not appear toxic and I do not think he needs to go to the emergency room at this time.  I did warn him that if his symptoms should change, he should go to the emergency room.   I have encouraged him to push fluids for the rest of the day and liberalize salt intake for today.   Decrease chlorthalidone to 12.5 mg daily and decrease losartan to 50 mg daily.   Obtain CMET, CBC with differential.   I encouraged him to get his chest x-ray.   Check blood pressure later today and call if systolic less than 90.   Follow-up in 3 to 4 weeks.

## 2022-03-30 NOTE — Patient Instructions (Addendum)
Medication Instructions:  Your physician has recommended you make the following change in your medication:   REDUCE the Losartan to 50 mg daily You can cut the 100 mg tablets in 1/2.  A new prescription has been sent to [facility]  REDUCE the Chlorthalidone to 25 taking only 1/2 tablet daily.    *If you need a refill on your cardiac medications before your next appointment, please call your pharmacy*   Lab Work: TODAY:  CMET & CBC W/DIFF   If you have labs (blood work) drawn today and your tests are completely normal, you will receive your results only by: MyChart Message (if you have MyChart) OR A paper copy in the mail If you have any lab test that is abnormal or we need to change your treatment, we will call you to review the results.   Testing/Procedures: A chest x-ray takes a picture of the organs and structures inside the chest, including the heart, lungs, and blood vessels. This test can show several things, including, whether the heart is enlarges; whether fluid is building up in the lungs; and whether pacemaker / defibrillator leads are still in place.  GO TO [facility] [address] FOR THIS TODAY    Follow-Up: At [facility], you and your health needs are our priority.  As part of our continuing mission to provide you with exceptional heart care, we have created designated Provider Care Teams.  These Care Teams include your primary Cardiologist (physician) and Advanced Practice Providers (APPs -  Physician Assistants and Nurse Practitioners) who all work together to provide you with the care you need, when you need it.  We recommend signing up for the patient portal called "MyChart".  Sign up information is provided on this After Visit Summary.  MyChart is used to connect with patients for Virtual Visits (Telemedicine).  Patients are able to view lab/test results, encounter notes, upcoming appointments, etc.  Non-urgent messages can  be sent to your provider as well.   To learn more about what you can do with MyChart, go to [email or web address].    Your next appointment:   4 week(s)  The format for your next appointment:   In Person  Provider:   [name], PA-C         Other Instructions PUSH FLUIDS WHEN YOU GET HOME, LIBERALIZE SALT TODAY ( EAT 1 CAN OF CHICKEN NOODLE SOUP)  CHECK YOUR BLOOD PRESSURE LATER TODAY, IF YOU TOP # IS BELOW 90, CALL THE OFFICE   Important Information About Sugar

## 2022-03-30 NOTE — Assessment & Plan Note (Signed)
Continue Plavix 75 mg once daily, Crestor 40 mg once daily.

## 2022-03-31 ENCOUNTER — Other Ambulatory Visit: Payer: Self-pay

## 2022-03-31 ENCOUNTER — Telehealth: Payer: Self-pay | Admitting: *Deleted

## 2022-03-31 ENCOUNTER — Telehealth: Payer: Self-pay | Admitting: Physician Assistant

## 2022-03-31 ENCOUNTER — Ambulatory Visit: Payer: Self-pay | Attending: Internal Medicine

## 2022-03-31 DIAGNOSIS — I10 Essential (primary) hypertension: Secondary | ICD-10-CM

## 2022-03-31 DIAGNOSIS — Z79899 Other long term (current) drug therapy: Secondary | ICD-10-CM

## 2022-03-31 DIAGNOSIS — R7989 Other specified abnormal findings of blood chemistry: Secondary | ICD-10-CM

## 2022-03-31 LAB — BASIC METABOLIC PANEL
BUN/Creatinine Ratio: 23 (ref 10–24)
BUN: 49 mg/dL — ABNORMAL HIGH (ref 8–27)
CO2: 27 mmol/L (ref 20–29)
Calcium: 8.8 mg/dL (ref 8.6–10.2)
Chloride: 96 mmol/L (ref 96–106)
Creatinine, Ser: 2.11 mg/dL — ABNORMAL HIGH (ref 0.76–1.27)
Glucose: 167 mg/dL — ABNORMAL HIGH (ref 70–99)
Potassium: 3.8 mmol/L (ref 3.5–5.2)
Sodium: 135 mmol/L (ref 134–144)
eGFR: 35 mL/min/{1.73_m2} — ABNORMAL LOW (ref >59)

## 2022-03-31 LAB — LIPID PANEL
Chol/HDL Ratio: 3.4 ratio (ref 0.0–5.0)
Cholesterol, Total: 99 mg/dL — ABNORMAL LOW (ref 100–199)
HDL: 29 mg/dL — ABNORMAL LOW (ref >39)
LDL Chol Calc (NIH): 44 mg/dL (ref 0–99)
Triglycerides: 147 mg/dL (ref 0–149)
VLDL Cholesterol Cal: 26 mg/dL (ref 5–40)

## 2022-03-31 LAB — COMPREHENSIVE METABOLIC PANEL
ALT: 29 IU/L (ref 0–44)
AST: 24 IU/L (ref 0–40)
Albumin/Globulin Ratio: 1.8 (ref 1.2–2.2)
Albumin: 4.4 g/dL (ref 3.9–4.9)
Alkaline Phosphatase: 75 IU/L (ref 44–121)
BUN/Creatinine Ratio: 13 (ref 10–24)
BUN: 32 mg/dL — ABNORMAL HIGH (ref 8–27)
Bilirubin Total: 0.5 mg/dL (ref 0.0–1.2)
CO2: 22 mmol/L (ref 20–29)
Calcium: 9.1 mg/dL (ref 8.6–10.2)
Chloride: 93 mmol/L — ABNORMAL LOW (ref 96–106)
Creatinine, Ser: 2.54 mg/dL — ABNORMAL HIGH (ref 0.76–1.27)
Globulin, Total: 2.4 g/dL (ref 1.5–4.5)
Glucose: 179 mg/dL — ABNORMAL HIGH (ref 70–99)
Potassium: 4.4 mmol/L (ref 3.5–5.2)
Sodium: 131 mmol/L — ABNORMAL LOW (ref 134–144)
Total Protein: 6.8 g/dL (ref 6.0–8.5)
eGFR: 28 mL/min/{1.73_m2} — ABNORMAL LOW (ref >59)

## 2022-03-31 LAB — CBC WITH DIFFERENTIAL/PLATELET
Basophils Absolute: 0.1 10*3/uL (ref 0.0–0.2)
Basos: 1 %
EOS (ABSOLUTE): 0 10*3/uL (ref 0.0–0.4)
Eos: 0 %
Hematocrit: 41.7 % (ref 37.5–51.0)
Hemoglobin: 14.3 g/dL (ref 13.0–17.7)
Immature Grans (Abs): 0 10*3/uL (ref 0.0–0.1)
Immature Granulocytes: 0 %
Lymphocytes Absolute: 1.8 10*3/uL (ref 0.7–3.1)
Lymphs: 25 %
MCH: 31.6 pg (ref 26.6–33.0)
MCHC: 34.3 g/dL (ref 31.5–35.7)
MCV: 92 fL (ref 79–97)
Monocytes Absolute: 1 10*3/uL — ABNORMAL HIGH (ref 0.1–0.9)
Monocytes: 14 %
Neutrophils Absolute: 4.3 10*3/uL (ref 1.4–7.0)
Neutrophils: 60 %
Platelets: 159 10*3/uL (ref 150–450)
RBC: 4.53 x10E6/uL (ref 4.14–5.80)
RDW: 12.9 % (ref 11.6–15.4)
WBC: 7.2 10*3/uL (ref 3.4–10.8)

## 2022-03-31 NOTE — Telephone Encounter (Signed)
  William Young is returning call to get lab result

## 2022-03-31 NOTE — Telephone Encounter (Signed)
-----   Message from Beatrice Lecher, [place] sent at 03/31/2022 10:23 AM EST ----- Creatinine significantly elevated.  Sodium low. K+ normal. Glucose high. LFTs normal. WBC, Hgb normal.  LDL optimal.  PLAN: -Stop Losartan, Chlorthalidone, Jardiance, Glucophage -Repeat BMET today (stat) -Push fluids (water) -Go to ED if feeling poorly (dizzy, nauseated, lethargic, etc). [name], PA-C    03/31/2022 10:22 AM

## 2022-03-31 NOTE — Telephone Encounter (Signed)
Pt aware to come in today for STAT BMET.  Stop Losartan, Chlorthalidone, Jardiance, & Metformin.

## 2022-03-31 NOTE — Telephone Encounter (Signed)
I spoke with Crystal (on DPR) and reviewed results/recommendations with her.  Patient will stop by office for lab work on 11/27.  I let Crystal know message had been sent through my chart also.

## 2022-03-31 NOTE — Telephone Encounter (Signed)
-----   Message from Beatrice Lecher, [place] sent at 03/31/2022  4:46 PM EST ----- Creatinine improved. K+ normal. PLAN:  -Stop Losartan, Chlorthalidone, Jardiance, Glucophage as noted this morning (see result note) -Arrange repeat BMET Monday -Continue to push fluids over the weekend. -If feels worse (lethargic, nauseated, syncope/near syncope, etc) >> go to ED -I will fwd to PCP as Marquis Buggy, PA-C    03/31/2022 4:40 PM

## 2022-04-03 ENCOUNTER — Other Ambulatory Visit: Payer: Self-pay

## 2022-04-06 ENCOUNTER — Ambulatory Visit: Payer: Self-pay | Attending: Physician Assistant

## 2022-04-06 DIAGNOSIS — I10 Essential (primary) hypertension: Secondary | ICD-10-CM

## 2022-04-06 DIAGNOSIS — R7989 Other specified abnormal findings of blood chemistry: Secondary | ICD-10-CM

## 2022-04-06 DIAGNOSIS — Z79899 Other long term (current) drug therapy: Secondary | ICD-10-CM

## 2022-04-07 LAB — BASIC METABOLIC PANEL
BUN/Creatinine Ratio: 11 (ref 10–24)
BUN: 11 mg/dL (ref 8–27)
CO2: 22 mmol/L (ref 20–29)
Calcium: 9.5 mg/dL (ref 8.6–10.2)
Chloride: 98 mmol/L (ref 96–106)
Creatinine, Ser: 1.01 mg/dL (ref 0.76–1.27)
Glucose: 173 mg/dL — ABNORMAL HIGH (ref 70–99)
Potassium: 4.5 mmol/L (ref 3.5–5.2)
Sodium: 136 mmol/L (ref 134–144)
eGFR: 85 mL/min/{1.73_m2} (ref >59)

## 2022-04-09 ENCOUNTER — Other Ambulatory Visit: Payer: Self-pay | Admitting: Physician Assistant

## 2022-04-09 DIAGNOSIS — E785 Hyperlipidemia, unspecified: Secondary | ICD-10-CM

## 2022-04-10 ENCOUNTER — Other Ambulatory Visit: Payer: Self-pay

## 2022-04-10 MED ORDER — ROSUVASTATIN CALCIUM 40 MG PO TABS
40.0000 mg | ORAL_TABLET | Freq: Every day | ORAL | 3 refills | Status: DC
  Filled 2022-04-10: qty 30, 30d supply, fill #0
  Filled 2022-05-08 (×3): qty 30, 30d supply, fill #1
  Filled 2022-06-10: qty 30, 30d supply, fill #2
  Filled 2022-07-09 – 2022-08-10 (×3): qty 30, 30d supply, fill #3

## 2022-04-13 ENCOUNTER — Other Ambulatory Visit: Payer: Self-pay

## 2022-04-17 ENCOUNTER — Other Ambulatory Visit: Payer: Self-pay

## 2022-04-21 NOTE — Progress Notes (Unsigned)
Cardiology Office Note:    Date:  04/22/2022   ID:  [name], DOB [birth year], MRN [id number]  PCP:  Gildardo Pounds, NP  [facility] Cardiologist:  Dorris Carnes, MD Cardiology APP:  Sharmon Revere     Referring MD: Gildardo Pounds, NP   Chief Complaint:  Follow-up for hypotension, AKI    Patient Profile: Coronary artery disease S/p NSTEMI in 5/16 >> PCI: BMS to pRCA; DES to RPLB3 No sig CAD elsewhere (LAD 15) Myoview 11/18: no ischemia  TTE 11/18: normal EF, inf-lat HK ASA allergy Cath 11/21: Patent RCA stent, patent RPL stent; medical therapy Myoview 09/23/2021: No ischemia or infarction, EF 91, low risk TTE 09/22/2021: EF 50-55, no RWMA, normal RVSF, TR signal and adequate for assessing PAP Aortic atherosclerosis  Abdominal aortic aneurysm  CT 01/2021: 3.7 cm  [place] 07/2021: 3.8 cm  Peripheral arterial disease  LE arterial US 08/15/2021: Proximal R SFA 30-49; proximal L CFA 30-49, ostial and mid L SFA 30-49 Dr. Gwenlyn Found - med Rx  Hx of mild Heart failure with preserved ejection fraction  Diabetes mellitus  Hypertension  Hyperlipidemia  Tobacco abuse  Chronic Obstructive Pulmonary Disease  Dr. Lamonte Sakai OSA Lung nodules CT 1/22: RUL 6 mm; RML 3 mm   Venous insufficiency S/p R GSV laser ablation (Dr. Kellie Simmering) in 2018    History of Present Illness:   William Young is a 61 y.o. male with the above problem list.  He was last seen 03/30/2022.  He was hypotensive.  Labs demonstrated significant AKI with creatinine up to 2.54.  I had him stop his losartan, chlorthalidone, metformin and Jardiance.  Follow-up creatinine in the next day was somewhat improved at 2.11.  Creatinine returned to normal at 1.01 on 04/06/2022.  He returns for follow-up.  He is here today with his partner.  He is feeling much better.  He has not had chest pain, shortness of breath, syncope, orthopnea.  His leg edema is chronic and stable. EKG:  not done    Reviewed and updated this  encounter:  Tobacco  Allergies  Meds  Problems  Med Hx  Surg Hx  Fam Hx     Review of Systems  Gastrointestinal:  Negative for hematochezia.  Genitourinary:  Negative for hematuria.    Labs/Other Test Reviewed:   Recent Labs: 09/03/2021: NT-Pro BNP 243 03/30/2022: ALT 29; Hemoglobin 14.3; Platelets 159 04/06/2022: BUN 11; Creatinine, Ser 1.01; Potassium 4.5; Sodium 136   Recent Lipid Panel Recent Labs    03/30/22 1001  CHOL 99*  TRIG 147  HDL 29*  LDLCALC 44    Risk Assessment/Calculations/Metrics:         HYPERTENSION CONTROL Vitals:   04/22/22 0845 04/22/22 0906  BP: 134/88 (!) 156/90    The patient's blood pressure is elevated above target today.  In order to address the patient's elevated BP: A new medication was prescribed today.; Blood pressure will be monitored at home to determine if medication changes need to be made.      Physical Exam:   VS:  BP (!) 156/90   Pulse 88   Ht 6\' 4"  (1.93 m)   Wt (!) 306 lb 9.6 oz (139.1 kg)   SpO2 92%   BMI 37.32 kg/m     Wt Readings from Last 3 Encounters:  04/22/22 (!) 306 lb 9.6 oz (139.1 kg)  03/30/22 (!) 303 lb 3.2 oz (137.5 kg)  03/27/22 (!) 306 lb 6.4 oz (139 kg)  Constitutional:      Appearance: Healthy appearance. Not in distress.  Neck:     Vascular: JVD normal.  Pulmonary:     Effort: Pulmonary effort is normal.     Breath sounds: No wheezing. No rales.  Cardiovascular:     Normal rate. Regular rhythm. Normal S1. Normal S2.      Murmurs: There is no murmur.  Edema:    Peripheral edema present.    Pretibial: bilateral 1+ edema of the pretibial area. Abdominal:     Palpations: There is no hepatomegaly.         ASSESSMENT & PLAN:   AKI (acute kidney injury) (HCC) AKI occurred in the setting of hypotension.  His creatinine returned to normal.  His blood pressure is now higher.  Repeat BMET today.  Essential hypertension As noted, blood pressure is now higher.  I will restart his losartan  50 mg daily.  Obtain follow-up BMET in 1 week.  Monitor blood pressure and send readings in 2 weeks.  Type 2 diabetes mellitus with complication, without long-term current use of insulin (HCC) I had him hold his Jardiance and metformin when his creatinine was elevated.  Now that his creatinine is normal, I have asked him to resume Jardiance 25 mg daily.  Repeat BMET in 1 week.  If his renal function remains stable, he can resume metformin.  Have asked him to keep an eye on his sugars at home and notify his PCP if his sugar is >300.  Coronary artery disease involving native coronary artery with angina pectoris (HCC) History non-STEMI in 2016 treated a BMS to the RCA and DES to the RPL branch 3.  Myoview in May 2023 was low risk.  He is currently not having chest discomfort to suggest angina.  Continue Plavix 75 mg daily, Zetia 10 mg daily, Crestor 40 mg daily.  AAA (abdominal aortic aneurysm) without rupture (HCC) 3.8 cm by ultrasound in March 2023.  Follow-up ultrasound planned in March 2024.          Dispo:  Return in about 3 months (around 07/22/2022) for Routine Follow Up, w/ Dr. Tenny Craw, or [name], PA-C.  Medication Adjustments/Labs and Tests Ordered: Current medicines are reviewed at length with the patient today.  Concerns regarding medicines are outlined above.  Tests Ordered: Orders Placed This Encounter  Procedures   Basic metabolic panel   Basic metabolic panel   Medication Changes: Meds ordered this encounter  Medications   losartan (COZAAR) 50 MG tablet    Sig: Take 1 tablet (50 mg total) by mouth daily.    Dispense:  90 tablet    Refill:  3   empagliflozin (JARDIANCE) 25 MG TABS tablet    Sig: Take 1 tablet (25 mg total) by mouth daily before breakfast.    Dispense:  90 tablet    Refill:  3   Signed, [name], PA-C  04/22/2022 9:12 AM    [facility] [address] [place], [place], [place]  [address] Phone: [phone]; Fax: [phone]

## 2022-04-22 ENCOUNTER — Encounter: Payer: Self-pay | Admitting: Physician Assistant

## 2022-04-22 ENCOUNTER — Ambulatory Visit: Payer: Self-pay | Attending: Physician Assistant | Admitting: Physician Assistant

## 2022-04-22 ENCOUNTER — Other Ambulatory Visit: Payer: Self-pay

## 2022-04-22 VITALS — BP 156/90 | HR 88 | Ht 76.0 in | Wt 306.6 lb

## 2022-04-22 DIAGNOSIS — I714 Abdominal aortic aneurysm, without rupture, unspecified: Secondary | ICD-10-CM

## 2022-04-22 DIAGNOSIS — I10 Essential (primary) hypertension: Secondary | ICD-10-CM

## 2022-04-22 DIAGNOSIS — E118 Type 2 diabetes mellitus with unspecified complications: Secondary | ICD-10-CM

## 2022-04-22 DIAGNOSIS — I25119 Atherosclerotic heart disease of native coronary artery with unspecified angina pectoris: Secondary | ICD-10-CM

## 2022-04-22 DIAGNOSIS — N179 Acute kidney failure, unspecified: Secondary | ICD-10-CM

## 2022-04-22 MED ORDER — LOSARTAN POTASSIUM 50 MG PO TABS
50.0000 mg | ORAL_TABLET | Freq: Every day | ORAL | 3 refills | Status: DC
  Filled 2022-04-22 – 2022-08-04 (×2): qty 90, 90d supply, fill #0
  Filled 2022-11-06: qty 90, 90d supply, fill #1
  Filled 2023-01-27: qty 90, 90d supply, fill #2

## 2022-04-22 MED ORDER — EMPAGLIFLOZIN 25 MG PO TABS
25.0000 mg | ORAL_TABLET | Freq: Every day | ORAL | 3 refills | Status: DC
  Filled 2022-04-22: qty 60, 60d supply, fill #0
  Filled 2022-06-21: qty 60, 60d supply, fill #1
  Filled 2022-08-24 – 2022-08-30 (×2): qty 90, 90d supply, fill #2
  Filled 2022-11-21 – 2022-11-23 (×2): qty 90, 90d supply, fill #3
  Filled 2023-02-26: qty 90, 90d supply, fill #4

## 2022-04-22 NOTE — Assessment & Plan Note (Addendum)
As noted, blood pressure is now higher.  I will restart his losartan 50 mg daily.  Obtain follow-up BMET in 1 week.  Monitor blood pressure and send readings in 2 weeks.

## 2022-04-22 NOTE — Patient Instructions (Signed)
Medication Instructions:  Your physician has recommended you make the following change in your medication:   RESTART Jardiance 25 mg taking 1 daily  RESTART Losartan 50 mg taking 1 daily   *If you need a refill on your cardiac medications before your next appointment, please call your pharmacy*   Lab Work: TODAY: BMET  1 WEEK:  BMET  If you have labs (blood work) drawn today and your tests are completely normal, you will receive your results only by: MyChart Message (if you have MyChart) OR A paper copy in the mail If you have any lab test that is abnormal or we need to change your treatment, we will call you to review the results.   Testing/Procedures: None ordered   Follow-Up: At [facility], you and your health needs are our priority.  As part of our continuing mission to provide you with exceptional heart care, we have created designated Provider Care Teams.  These Care Teams include your primary Cardiologist (physician) and Advanced Practice Providers (APPs -  Physician Assistants and Nurse Practitioners) who all work together to provide you with the care you need, when you need it.  We recommend signing up for the patient portal called "MyChart".  Sign up information is provided on this After Visit Summary.  MyChart is used to connect with patients for Virtual Visits (Telemedicine).  Patients are able to view lab/test results, encounter notes, upcoming appointments, etc.  Non-urgent messages can be sent to your provider as well.   To learn more about what you can do with MyChart, go to [email or web address].    Your next appointment:   3 month(s)  The format for your next appointment:   In Person  Provider:   Dietrich Pates, MD  or [name], PA-C         Other Instructions Your physician has requested that you regularly monitor and record your blood pressure readings at home. Please use the same machine at the same time of day to check your readings and record  them to bring to your follow-up visit.   Please monitor blood pressures and keep a log of your readings for 2 weeks and either send a mychart message or call the office with these readings.    Make sure to check 2 hours after your medications.    AVOID these things for 30 minutes before checking your blood pressure: No Drinking caffeine. No Drinking alcohol. No Eating. No Smoking. No Exercising.   Five minutes before checking your blood pressure: Pee. Sit in a dining chair. Avoid sitting in a soft couch or armchair. Be quiet. Do not talk   Important Information About Sugar

## 2022-04-22 NOTE — Assessment & Plan Note (Signed)
I had him hold his Jardiance and metformin when his creatinine was elevated.  Now that his creatinine is normal, I have asked him to resume Jardiance 25 mg daily.  Repeat BMET in 1 week.  If his renal function remains stable, he can resume metformin.  Have asked him to keep an eye on his sugars at home and notify his PCP if his sugar is >300.

## 2022-04-22 NOTE — Assessment & Plan Note (Signed)
3.8 cm by ultrasound in March 2023.  Follow-up ultrasound planned in March 2024.

## 2022-04-22 NOTE — Assessment & Plan Note (Signed)
AKI occurred in the setting of hypotension.  His creatinine returned to normal.  His blood pressure is now higher.  Repeat BMET today.

## 2022-04-22 NOTE — Assessment & Plan Note (Signed)
History non-STEMI in 2016 treated a BMS to the RCA and DES to the RPL branch 3.  Myoview in May 2023 was low risk.  He is currently not having chest discomfort to suggest angina.  Continue Plavix 75 mg daily, Zetia 10 mg daily, Crestor 40 mg daily.

## 2022-04-23 ENCOUNTER — Other Ambulatory Visit: Payer: Self-pay

## 2022-04-23 LAB — BASIC METABOLIC PANEL
BUN/Creatinine Ratio: 14 (ref 10–24)
BUN: 14 mg/dL (ref 8–27)
CO2: 22 mmol/L (ref 20–29)
Calcium: 9.1 mg/dL (ref 8.6–10.2)
Chloride: 105 mmol/L (ref 96–106)
Creatinine, Ser: 0.99 mg/dL (ref 0.76–1.27)
Glucose: 130 mg/dL — ABNORMAL HIGH (ref 70–99)
Potassium: 4.3 mmol/L (ref 3.5–5.2)
Sodium: 138 mmol/L (ref 134–144)
eGFR: 87 mL/min/{1.73_m2} (ref >59)

## 2022-04-24 ENCOUNTER — Other Ambulatory Visit: Payer: Self-pay

## 2022-04-24 ENCOUNTER — Other Ambulatory Visit: Payer: Self-pay | Admitting: Physician Assistant

## 2022-04-27 ENCOUNTER — Other Ambulatory Visit: Payer: Self-pay

## 2022-04-28 ENCOUNTER — Other Ambulatory Visit: Payer: Self-pay | Admitting: Family Medicine

## 2022-04-28 ENCOUNTER — Other Ambulatory Visit: Payer: Self-pay

## 2022-04-28 DIAGNOSIS — M519 Unspecified thoracic, thoracolumbar and lumbosacral intervertebral disc disorder: Secondary | ICD-10-CM

## 2022-04-29 ENCOUNTER — Other Ambulatory Visit: Payer: Self-pay

## 2022-04-29 ENCOUNTER — Ambulatory Visit: Payer: Self-pay | Attending: Physician Assistant

## 2022-04-29 DIAGNOSIS — N179 Acute kidney failure, unspecified: Secondary | ICD-10-CM

## 2022-04-29 MED ORDER — TIZANIDINE HCL 4 MG PO TABS
4.0000 mg | ORAL_TABLET | Freq: Three times a day (TID) | ORAL | 2 refills | Status: DC | PRN
  Filled 2022-04-29: qty 60, 20d supply, fill #0
  Filled 2022-06-20: qty 60, 20d supply, fill #1
  Filled 2022-08-10: qty 60, 20d supply, fill #2

## 2022-04-30 ENCOUNTER — Telehealth: Payer: Self-pay | Admitting: *Deleted

## 2022-04-30 DIAGNOSIS — Z79899 Other long term (current) drug therapy: Secondary | ICD-10-CM

## 2022-04-30 LAB — BASIC METABOLIC PANEL
BUN/Creatinine Ratio: 16 (ref 10–24)
BUN: 16 mg/dL (ref 8–27)
CO2: 22 mmol/L (ref 20–29)
Calcium: 8.9 mg/dL (ref 8.6–10.2)
Chloride: 101 mmol/L (ref 96–106)
Creatinine, Ser: 1.03 mg/dL (ref 0.76–1.27)
Glucose: 142 mg/dL — ABNORMAL HIGH (ref 70–99)
Potassium: 4.1 mmol/L (ref 3.5–5.2)
Sodium: 138 mmol/L (ref 134–144)
eGFR: 83 mL/min/{1.73_m2} (ref >59)

## 2022-04-30 MED ORDER — METFORMIN HCL 500 MG PO TABS: 500.0000 mg | ORAL_TABLET | Freq: Two times a day (BID) | ORAL | 0 refills | Status: DC

## 2022-04-30 NOTE — Telephone Encounter (Signed)
See lab results.  

## 2022-05-08 ENCOUNTER — Other Ambulatory Visit: Payer: Self-pay

## 2022-05-08 ENCOUNTER — Telehealth: Payer: Self-pay | Admitting: Internal Medicine

## 2022-05-08 DIAGNOSIS — Z79899 Other long term (current) drug therapy: Secondary | ICD-10-CM

## 2022-05-08 NOTE — Telephone Encounter (Signed)
Returned call to pt, left another message to call back.

## 2022-05-08 NOTE — Telephone Encounter (Signed)
Pt returning nurses call regarding results. Please advise 

## 2022-05-12 ENCOUNTER — Other Ambulatory Visit: Payer: Self-pay

## 2022-05-13 NOTE — Telephone Encounter (Signed)
This is a duplicate.  Pt already aware of results.

## 2022-05-15 ENCOUNTER — Other Ambulatory Visit: Payer: Self-pay

## 2022-05-18 ENCOUNTER — Other Ambulatory Visit: Payer: Self-pay | Admitting: Physician Assistant

## 2022-05-18 ENCOUNTER — Other Ambulatory Visit: Payer: Self-pay

## 2022-05-18 DIAGNOSIS — E1165 Type 2 diabetes mellitus with hyperglycemia: Secondary | ICD-10-CM

## 2022-05-21 ENCOUNTER — Ambulatory Visit: Payer: Self-pay | Attending: Physician Assistant

## 2022-05-21 DIAGNOSIS — Z79899 Other long term (current) drug therapy: Secondary | ICD-10-CM

## 2022-05-21 LAB — BASIC METABOLIC PANEL
BUN/Creatinine Ratio: 13 (ref 10–24)
BUN: 14 mg/dL (ref 8–27)
CO2: 21 mmol/L (ref 20–29)
Calcium: 9.1 mg/dL (ref 8.6–10.2)
Chloride: 103 mmol/L (ref 96–106)
Creatinine, Ser: 1.06 mg/dL (ref 0.76–1.27)
Glucose: 138 mg/dL — ABNORMAL HIGH (ref 70–99)
Potassium: 4.5 mmol/L (ref 3.5–5.2)
Sodium: 139 mmol/L (ref 134–144)
eGFR: 80 mL/min/{1.73_m2} (ref >59)

## 2022-05-24 ENCOUNTER — Other Ambulatory Visit: Payer: Self-pay | Admitting: Nurse Practitioner

## 2022-05-24 DIAGNOSIS — M519 Unspecified thoracic, thoracolumbar and lumbosacral intervertebral disc disorder: Secondary | ICD-10-CM

## 2022-05-25 ENCOUNTER — Other Ambulatory Visit: Payer: Self-pay

## 2022-05-25 MED ORDER — GABAPENTIN 300 MG PO CAPS
300.0000 mg | ORAL_CAPSULE | Freq: Every day | ORAL | 2 refills | Status: DC
  Filled 2022-05-25: qty 30, 30d supply, fill #0
  Filled 2022-06-20: qty 30, 30d supply, fill #1
  Filled 2022-07-22: qty 30, 30d supply, fill #2

## 2022-05-26 ENCOUNTER — Other Ambulatory Visit: Payer: Self-pay | Admitting: Nurse Practitioner

## 2022-05-26 ENCOUNTER — Other Ambulatory Visit: Payer: Self-pay

## 2022-05-26 NOTE — Progress Notes (Signed)
Pt has been made aware of normal result and verbalized understanding.  jw

## 2022-05-27 ENCOUNTER — Other Ambulatory Visit: Payer: Self-pay

## 2022-05-27 MED ORDER — METFORMIN HCL 500 MG PO TABS
500.0000 mg | ORAL_TABLET | Freq: Two times a day (BID) | ORAL | 0 refills | Status: DC
  Filled 2022-05-27: qty 60, 30d supply, fill #0

## 2022-05-27 NOTE — Telephone Encounter (Signed)
Requested medication (s) are due for refill today: yes  Requested medication (s) are on the active medication list: yes  Last refill:  04/30/22  Future visit scheduled: yes  Notes to clinic:  Unable to refill per protocol, last refill by another provider.      Requested Prescriptions  Pending Prescriptions Disp Refills   metFORMIN (GLUCOPHAGE) 500 MG tablet 60 tablet 0    Sig: Take 1 tablet (500 mg total) by mouth 2 (two) times daily with a meal.     Endocrinology:  Diabetes - Biguanides Failed - 05/26/2022  3:37 PM      Failed - B12 Level in normal range and within 720 days    No results found for: "VITAMINB12"       Passed - Cr in normal range and within 360 days    Creat  Date Value Ref Range Status  12/27/2015 0.75 0.70 - 1.33 mg/dL Final    Comment:      For patients > or = 62 years of age: The upper reference limit for Creatinine is approximately 13% higher for people identified as African-American.      Creatinine, Ser  Date Value Ref Range Status  05/21/2022 1.06 0.76 - 1.27 mg/dL Final   Creatinine, Urine  Date Value Ref Range Status  11/18/2015 390 (H) 20 - 370 mg/dL Final    Comment:    Result confirmed by automatic dilution. Result repeated and verified.          Passed - HBA1C is between 0 and 7.9 and within 180 days    HbA1c, POC (controlled diabetic range)  Date Value Ref Range Status  03/20/2022 7.2 (A) 0.0 - 7.0 % Final         Passed - eGFR in normal range and within 360 days    GFR calc Af Amer  Date Value Ref Range Status  05/24/2020 84 >59 mL/min/1.73 Final    Comment:    **In accordance with recommendations from the NKF-ASN Task force,**   [facility] is in the process of updating its eGFR calculation to the   2021 CKD-EPI creatinine equation that estimates kidney function   without a race variable.    GFR, Estimated  Date Value Ref Range Status  01/27/2021 >60 >60 mL/min Final    Comment:    (NOTE) Calculated using the CKD-EPI  Creatinine Equation (2021)    GFR  Date Value Ref Range Status  11/26/2014 102.54 >60.00 mL/min Final   eGFR  Date Value Ref Range Status  05/21/2022 80 >59 mL/min/1.73 Final         Passed - Valid encounter within last 6 months    Recent Outpatient Visits           2 months ago Chronic obstructive pulmonary disease, unspecified COPD type [facility])   [place], Vernia Buff, NP   2 months ago Primary hypertension   [place], [place] W, NP   5 months ago Type 2 diabetes mellitus with hyperglycemia, without long-term current use of insulin [facility])   [place], [place], [place]   1 year ago Type 2 diabetes mellitus with hyperglycemia, without long-term current use of insulin ([place])   [place], Charlane Ferretti, MD   1 year ago Type 2 diabetes mellitus with hyperglycemia, without long-term current use of insulin [facility])   [place], [place]  W, NP       Future Appointments             In 1 month Gildardo Pounds, NP [place]   In 2 months [place] Challenger, Carmin Muskrat, MD [facility] [place]            Passed - CBC within normal limits and completed in the last 12 months    WBC  Date Value Ref Range Status  03/30/2022 7.2 3.4 - 10.8 x10E3/uL Final  01/27/2021 7.2 4.0 - 10.5 K/uL Final   RBC  Date Value Ref Range Status  03/30/2022 4.53 4.14 - 5.80 x10E6/uL Final  01/27/2021 4.46 4.22 - 5.81 MIL/uL Final   Hemoglobin  Date Value Ref Range Status  03/30/2022 14.3 13.0 - 17.7 g/dL Final   Hematocrit  Date Value Ref Range Status  03/30/2022 41.7 37.5 - 51.0 % Final   MCHC  Date Value Ref Range Status  03/30/2022 34.3 31.5 - 35.7 g/dL Final  01/27/2021 32.7 30.0 - 36.0 g/dL Final   [facility]  Date Value Ref Range Status   03/30/2022 31.6 26.6 - 33.0 pg Final  01/27/2021 30.9 26.0 - 34.0 pg Final   MCV  Date Value Ref Range Status  03/30/2022 92 79 - 97 fL Final   No results found for: "PLTCOUNTKUC", "LABPLAT", "POCPLA" RDW  Date Value Ref Range Status  03/30/2022 12.9 11.6 - 15.4 % Final

## 2022-05-28 ENCOUNTER — Other Ambulatory Visit: Payer: Self-pay

## 2022-06-10 ENCOUNTER — Other Ambulatory Visit: Payer: Self-pay

## 2022-06-11 ENCOUNTER — Other Ambulatory Visit: Payer: Self-pay

## 2022-06-21 ENCOUNTER — Other Ambulatory Visit: Payer: Self-pay | Admitting: Family Medicine

## 2022-06-22 ENCOUNTER — Other Ambulatory Visit: Payer: Self-pay

## 2022-06-22 MED ORDER — METFORMIN HCL 500 MG PO TABS
500.0000 mg | ORAL_TABLET | Freq: Two times a day (BID) | ORAL | 0 refills | Status: DC
  Filled 2022-06-22: qty 60, 30d supply, fill #0

## 2022-06-26 ENCOUNTER — Other Ambulatory Visit: Payer: Self-pay

## 2022-06-29 ENCOUNTER — Other Ambulatory Visit: Payer: Self-pay

## 2022-06-30 ENCOUNTER — Ambulatory Visit: Payer: Self-pay | Admitting: Nurse Practitioner

## 2022-07-01 ENCOUNTER — Other Ambulatory Visit: Payer: Self-pay

## 2022-07-06 ENCOUNTER — Other Ambulatory Visit: Payer: Self-pay

## 2022-07-09 ENCOUNTER — Other Ambulatory Visit: Payer: Self-pay

## 2022-07-13 ENCOUNTER — Other Ambulatory Visit: Payer: Self-pay

## 2022-07-15 ENCOUNTER — Other Ambulatory Visit: Payer: Self-pay

## 2022-07-17 ENCOUNTER — Telehealth: Payer: Self-pay | Admitting: Internal Medicine

## 2022-07-17 NOTE — Telephone Encounter (Signed)
Patient states he is returning a call, but does not know who called or what it was regarding. 

## 2022-07-22 ENCOUNTER — Other Ambulatory Visit: Payer: Self-pay

## 2022-07-22 ENCOUNTER — Other Ambulatory Visit: Payer: Self-pay | Admitting: Family Medicine

## 2022-07-23 ENCOUNTER — Other Ambulatory Visit: Payer: Self-pay

## 2022-07-23 MED ORDER — METFORMIN HCL 500 MG PO TABS
500.0000 mg | ORAL_TABLET | Freq: Two times a day (BID) | ORAL | 0 refills | Status: DC
  Filled 2022-07-23: qty 60, 30d supply, fill #0

## 2022-07-27 ENCOUNTER — Other Ambulatory Visit: Payer: Self-pay

## 2022-07-28 ENCOUNTER — Other Ambulatory Visit: Payer: Self-pay

## 2022-07-30 ENCOUNTER — Other Ambulatory Visit: Payer: Self-pay

## 2022-08-04 ENCOUNTER — Other Ambulatory Visit: Payer: Self-pay

## 2022-08-05 ENCOUNTER — Encounter: Payer: Self-pay | Admitting: Nurse Practitioner

## 2022-08-05 ENCOUNTER — Telehealth: Payer: Self-pay

## 2022-08-05 ENCOUNTER — Other Ambulatory Visit: Payer: Self-pay

## 2022-08-05 ENCOUNTER — Ambulatory Visit: Payer: Medicaid Other | Attending: Nurse Practitioner | Admitting: Nurse Practitioner

## 2022-08-05 VITALS — BP 134/85 | HR 65 | Ht 76.0 in | Wt 311.2 lb

## 2022-08-05 DIAGNOSIS — E1165 Type 2 diabetes mellitus with hyperglycemia: Secondary | ICD-10-CM

## 2022-08-05 DIAGNOSIS — I10 Essential (primary) hypertension: Secondary | ICD-10-CM

## 2022-08-05 DIAGNOSIS — M519 Unspecified thoracic, thoracolumbar and lumbosacral intervertebral disc disorder: Secondary | ICD-10-CM

## 2022-08-05 LAB — POCT GLYCOSYLATED HEMOGLOBIN (HGB A1C): Hemoglobin A1C: 6.9 % — AB (ref 4.0–5.6)

## 2022-08-05 LAB — GLUCOSE, POCT (MANUAL RESULT ENTRY): POC Glucose: 131 mg/dl — AB (ref 70–99)

## 2022-08-05 MED ORDER — GABAPENTIN 300 MG PO CAPS
300.0000 mg | ORAL_CAPSULE | Freq: Every day | ORAL | 1 refills | Status: DC
  Filled 2022-08-05 – 2022-08-24 (×3): qty 90, 90d supply, fill #0
  Filled 2022-11-18 – 2022-11-19 (×2): qty 90, 90d supply, fill #1

## 2022-08-05 MED ORDER — GLIMEPIRIDE 4 MG PO TABS
8.0000 mg | ORAL_TABLET | Freq: Every day | ORAL | 3 refills | Status: DC
  Filled 2022-08-05 – 2022-08-30 (×2): qty 90, 45d supply, fill #0
  Filled 2022-10-10: qty 90, 45d supply, fill #1
  Filled 2022-11-21: qty 90, 45d supply, fill #2
  Filled 2023-01-10: qty 90, 45d supply, fill #3

## 2022-08-05 MED ORDER — METFORMIN HCL 500 MG PO TABS
500.0000 mg | ORAL_TABLET | Freq: Two times a day (BID) | ORAL | 1 refills | Status: DC
  Filled 2022-08-05 – 2022-08-23 (×2): qty 180, 90d supply, fill #0
  Filled 2022-11-21: qty 180, 90d supply, fill #1

## 2022-08-05 NOTE — Telephone Encounter (Signed)
I met with the patient when he was in the clinic today. He is uninsured and said he has an application for Medicaid but has not completed it. He does not have a DSS caseworker.  I explained to him that [facility] has enrollment specialists on the 4th floor of this building who can assist him with applying for Medicaid.  He was agreeable to learning more about Medicaid and I escorted him to their Medicaid  office for assistance.

## 2022-08-05 NOTE — Progress Notes (Signed)
Assessment & Plan:  William Young was seen today for diabetes and hypertension.  Diagnoses and all orders for this visit:  Type 2 diabetes mellitus with hyperglycemia, without long-term current use of insulin (HCC) -     POCT glycosylated hemoglobin (Hb A1C) -     Microalbumin / creatinine urine ratio -     POCT glucose (manual entry) -     glimepiride (AMARYL) 4 MG tablet; Take 2 tablets (8 mg total) by mouth daily with breakfast. -     metFORMIN (GLUCOPHAGE) 500 MG tablet; Take 1 tablet (500 mg total) by mouth 2 (two) times daily with a meal.  Primary hypertension Continue all antihypertensives as prescribed.  Reminded to bring in blood pressure log for follow  up appointment.  RECOMMENDATIONS: DASH/Mediterranean Diets are healthier choices for HTN.    Lumbar disc disease -     gabapentin (NEURONTIN) 300 MG capsule; Take 1 capsule (300 mg total) by mouth at bedtime.    Patient has been counseled on age-appropriate routine health concerns for screening and prevention. These are reviewed and up-to-date. Referrals have been placed accordingly. Immunizations are up-to-date or declined.    Subjective:   Chief Complaint  Patient presents with   Diabetes   Hypertension   HPI William Young 62 y.o. male presents to office today for follow up to DM and HTN He is accompanied by his significant other today  He has a past medical history of Abdominal aortic aneurysm, Aortic atherosclerosis, CAD (09/2014), GERD, History of echocardiogram, HLD, HTN,  Hx of NSTEMI 5/16 tx with BMS to [facility] and DES to RPLB3 (09/10/2014), Hyperglycemia, OSA, Pulmonary nodules, COPD, PAD, Tobacco use, Type II diabetes mellitus, and Varicose veins of both lower extremities.    DM  Diabetes improving. Goal is less than 7. Will increase glimepiride to 8 mg daily. Continue jardiance 25 mg daily and metformin 500 mg BID. LDL at goal.  Lab Results  Component Value Date   HGBA1C 6.9 (A) 08/05/2022     HTN Well  controlled with chlorthalidone 25 mg daily, losartan 100 mg daily  BP Readings from Last 3 Encounters:  08/05/22 134/85  04/22/22 (!) 156/90  03/30/22 90/60     Review of Systems  Constitutional:  Negative for fever, malaise/fatigue and weight loss.  HENT: Negative.  Negative for nosebleeds.   Eyes: Negative.  Negative for blurred vision, double vision and photophobia.  Respiratory: Negative.  Negative for cough and shortness of breath.   Cardiovascular: Negative.  Negative for chest pain, palpitations and leg swelling.  Gastrointestinal: Negative.  Negative for heartburn, nausea and vomiting.  Musculoskeletal:  Positive for back pain and myalgias.  Neurological: Negative.  Negative for dizziness, focal weakness, seizures and headaches.  Psychiatric/Behavioral: Negative.  Negative for suicidal ideas.     Past Medical History:  Diagnosis Date   Abdominal aortic aneurysm (AAA) ([place])    CT 1/22: infrarenal 3.8 cm - repeat US in 2 years // AAA [place] 2/22: AAA 3.8 cm; aorto iliac atherosclerosis without hemodynamically significant stenosis   Aortic atherosclerosis ([place])    CT in 03/2020   CAD (coronary artery disease) 09/2014   a. NSTEMI >> LHC 5/16:  mLAD 15, pRCA 90, mRCA 40, RPLB3 80, EF normal with inf HK >> PCI:  BMS to pRCA and DES to RPLB3 // Cath 11/21: Patent RCA stent, patent RPL stent; medical therapy    GERD (gastroesophageal reflux disease)    History of echocardiogram    Echo 6/16:  EF 50-55%, no RWMA, Gr 1 DD   HLD (hyperlipidemia)    Archie Endo 03/15/2017   HTN (hypertension)    Hx of NSTEMI 5/16 tx with BMS to [facility] and DES to RPLB3 09/10/2014   Hyperglycemia    OSA (obstructive sleep apnea)    "dx'd; couldn't tolerate mask" (03/15/2017)   PAD (peripheral artery disease) ([place])    AAA [place] in 06/2020: aorto iliac atherosclerosis   Tobacco use    Type II diabetes mellitus ([place])    Varicose veins of both lower extremities    S/P ablation 02/2017    Past Surgical History:   Procedure Laterality Date   CARDIAC CATHETERIZATION N/A 09/11/2014   Procedure: Left Heart Cath and Coronary Angiography;  Surgeon: Leonie Man, MD;  Location: [facility] INVASIVE CV LAB CUPID;  Service: Cardiovascular;  Laterality: N/A;   CARDIAC CATHETERIZATION  09/11/2014   Procedure: Coronary Stent Intervention;  Surgeon: Leonie Man, MD;  Location: [facility] INVASIVE CV LAB CUPID;  Service: Cardiovascular;;   ENDOVENOUS ABLATION SAPHENOUS VEIN W/ LASER Right 02/22/2017   endovenous laser ablation R GSV and stab phlebectomy >20 incisions R leg by Tinnie Gens MD    LEFT HEART CATH AND CORONARY ANGIOGRAPHY N/A 04/01/2020   Procedure: LEFT HEART CATH AND CORONARY ANGIOGRAPHY;  Surgeon: Lorretta Harp, MD;  Location: [place] CV LAB;  Service: Cardiovascular;  Laterality: N/A;    Family History  Problem Relation Age of Onset   Heart attack Maternal Uncle    Heart attack Paternal Grandmother    Cancer Maternal Grandfather    Heart attack Maternal Grandmother    Stroke Neg Hx    Hypertension Neg Hx     Social History Reviewed with no changes to be made today.   Outpatient Medications Prior to Visit  Medication Sig Dispense Refill   clopidogrel (PLAVIX) 75 MG tablet Take 1 tablet (75 mg total) by mouth daily. 90 tablet 3   empagliflozin (JARDIANCE) 25 MG TABS tablet Take 1 tablet (25 mg total) by mouth daily before breakfast. 90 tablet 3   ezetimibe (ZETIA) 10 MG tablet Take 1 tablet (10 mg total) by mouth daily. 90 tablet 3   fluticasone furoate-vilanterol (BREO ELLIPTA) 200-25 MCG/ACT AEPB Inhale 1 puff into the lungs daily. 60 each 11   lidocaine (LIDODERM) 5 % Place 1 patch onto the skin daily. Remove & Discard patch within 12 hours or as directed by MD 30 patch 0   losartan (COZAAR) 50 MG tablet Take 1 tablet (50 mg total) by mouth daily. 90 tablet 3   nitroGLYCERIN (NITROSTAT) 0.4 MG SL tablet Place 1 tablet (0.4 mg total) under the tongue every 5 (five) minutes x 3 doses as needed  for chest pain. 25 tablet 12   rosuvastatin (CRESTOR) 40 MG tablet Take 1 tablet (40 mg total) by mouth daily. 30 tablet 3   tiZANidine (ZANAFLEX) 4 MG tablet Take 1 tablet (4 mg total) by mouth every 8 (eight) hours as needed for muscle spasms. 60 tablet 2   dextromethorphan-guaiFENesin (TUSSIN DM) 10-100 MG/5ML liquid Take 10 mLs by mouth every 4 (four) hours as needed for cough. 240 mL 1   gabapentin (NEURONTIN) 300 MG capsule Take 1 capsule (300 mg total) by mouth at bedtime. 30 capsule 2   glimepiride (AMARYL) 4 MG tablet Take 2 tablets (8 mg total) by mouth daily with breakfast. 90 tablet 3   metFORMIN (GLUCOPHAGE) 500 MG tablet Take 1 tablet (500 mg total) by mouth 2 (two)  times daily with a meal. 60 tablet 0   Blood Glucose Monitoring Suppl (TRUE METRIX METER) w/Device KIT Use as instructed. Check blood glucose level by fingerstick twice per day. E11.65 (Patient not taking: Reported on 08/05/2022) 1 kit 0   No facility-administered medications prior to visit.    Allergies  Allergen Reactions   Aspirin Anaphylaxis, Swelling and Rash    Throat closes    Ibuprofen Anaphylaxis, Swelling and Rash       Objective:    BP 134/85   Pulse 65   Ht 6\' 4"  (1.93 m)   Wt (!) 311 lb 3.2 oz (141.2 kg)   SpO2 96%   BMI 37.88 kg/m  Wt Readings from Last 3 Encounters:  08/05/22 (!) 311 lb 3.2 oz (141.2 kg)  04/22/22 (!) 306 lb 9.6 oz (139.1 kg)  03/30/22 (!) 303 lb 3.2 oz (137.5 kg)    Physical Exam Vitals and nursing note reviewed.  Constitutional:      Appearance: He is well-developed.  HENT:     Head: Normocephalic and atraumatic.  Cardiovascular:     Rate and Rhythm: Normal rate and regular rhythm.     Heart sounds: Normal heart sounds. No murmur heard.    No friction rub. No gallop.  Pulmonary:     Effort: Pulmonary effort is normal. No tachypnea or respiratory distress.     Breath sounds: Normal breath sounds. No decreased breath sounds, wheezing, rhonchi or rales.  Chest:      Chest wall: No tenderness.  Abdominal:     General: Bowel sounds are normal.     Palpations: Abdomen is soft.  Musculoskeletal:        General: Normal range of motion.     Cervical back: Normal range of motion.  Skin:    General: Skin is warm and dry.  Neurological:     Mental Status: He is alert and oriented to person, place, and time.     Coordination: Coordination normal.  Psychiatric:        Behavior: Behavior normal. Behavior is cooperative.        Thought Content: Thought content normal.        Judgment: Judgment normal.          Patient has been counseled extensively about nutrition and exercise as well as the importance of adherence with medications and regular follow-up. The patient was given clear instructions to go to ER or return to medical center if symptoms don't improve, worsen or new problems develop. The patient verbalized understanding.   Follow-up: Return in about 3 months (around 11/05/2022).   Gildardo Pounds, FNP-BC [facility] and [place], [place]   08/05/2022, 3:00 PM

## 2022-08-06 ENCOUNTER — Ambulatory Visit: Payer: Medicaid Other | Attending: Internal Medicine | Admitting: Internal Medicine

## 2022-08-06 ENCOUNTER — Encounter: Payer: Self-pay | Admitting: Internal Medicine

## 2022-08-06 VITALS — BP 130/72 | HR 78 | Ht 75.0 in | Wt 309.4 lb

## 2022-08-06 DIAGNOSIS — R531 Weakness: Secondary | ICD-10-CM | POA: Insufficient documentation

## 2022-08-06 DIAGNOSIS — I714 Abdominal aortic aneurysm, without rupture, unspecified: Secondary | ICD-10-CM | POA: Insufficient documentation

## 2022-08-06 NOTE — Patient Instructions (Signed)
Medication Instructions:  Your physician recommends that you continue on your current medications as directed. Please refer to the Current Medication list given to you today.  *If you need a refill on your cardiac medications before your next appointment, please call your pharmacy*   Lab Work: Vit D level If you have labs (blood work) drawn today and your tests are completely normal, you will receive your results only by: [place] (if you have MyChart) OR A paper copy in the mail If you have any lab test that is abnormal or we need to change your treatment, we will call you to review the results.   Testing/Procedures: Your physician has requested that you have an abdominal aorta duplex. During this test, an ultrasound is used to evaluate the aorta. Allow 30 minutes for this exam. Do not eat after midnight the day before and avoid carbonated beverages    Follow-Up: At [facility], you and your health needs are our priority.  As part of our continuing mission to provide you with exceptional heart care, we have created designated Provider Care Teams.  These Care Teams include your primary Cardiologist (physician) and Advanced Practice Providers (APPs -  Physician Assistants and Nurse Practitioners) who all work together to provide you with the care you need, when you need it.  We recommend signing up for the patient portal called "MyChart".  Sign up information is provided on this After Visit Summary.  MyChart is used to connect with patients for Virtual Visits (Telemedicine).  Patients are able to view lab/test results, encounter notes, upcoming appointments, etc.  Non-urgent messages can be sent to your provider as well.   To learn more about what you can do with MyChart, go to [email or web address].    Your next appointment:   9 month(s)  Provider:   Dorris Carnes, MD

## 2022-08-06 NOTE — Progress Notes (Signed)
Cardiology Office Note   Date:  08/06/2022   ID:  William Young, DOB 09-24-1960, MRN [id number]  PCP:  Gildardo Pounds, NP  Cardiologist:   Dorris Carnes, MD   F/U of CAD    History of Present Illness: William Young is a 62 y.o. male with a history of CAD, DM, GERD, HTN, HL, AAA (USN 3.8 cm March 2023), COPD, OSA, venous insufficency  In 09/2014 he had a NSTEMI  LHC done which showed single vessel CAD with 90% prox RCA and 80^ R PLSA.  Underwent PCI/BMS to prox RCA and resolute DES to RPLB3.  Hx of ASA allergy so treated  With Brilinta for 1 year then continued on plavix.    Pt admitted in Nov 2018 with CP  Myovue post discharge showed no ischemia   LVEF 38%  Echo showed LVEF 55 to 60^ with inferolateral hypokinesis  L heart cath in Nov 2021 showed patent RCA, RPL stents      Myoview in May 2023   No ischemia or infarct   LVEF 47% TTE   LVEF 50 to 55% (may 2023)   Nov 2023the pt  was hypotensive  After  Developed renal insuff  Cr 2.54.  This improved to 1.01 by end of November    The pt was last seen by Kathleen Argue in Dec 2023   Since seen the pt says  his breathing has been OK   He denies CP    No dizziness   No palpitations   Diet   One meal per day  Chicken and veggies  Rare 7UP   Some candy   Current Meds  Medication Sig   Blood Glucose Monitoring Suppl (TRUE METRIX METER) w/Device KIT Use as instructed. Check blood glucose level by fingerstick twice per day. E11.65   clopidogrel (PLAVIX) 75 MG tablet Take 1 tablet (75 mg total) by mouth daily.   empagliflozin (JARDIANCE) 25 MG TABS tablet Take 1 tablet (25 mg total) by mouth daily before breakfast.   ezetimibe (ZETIA) 10 MG tablet Take 1 tablet (10 mg total) by mouth daily.   fluticasone furoate-vilanterol (BREO ELLIPTA) 200-25 MCG/ACT AEPB Inhale 1 puff into the lungs daily.   gabapentin (NEURONTIN) 300 MG capsule Take 1 capsule (300 mg total) by mouth at bedtime.   glimepiride (AMARYL) 4 MG tablet Take 2 tablets (8 mg total)  by mouth daily with breakfast.   lidocaine (LIDODERM) 5 % Place 1 patch onto the skin daily. Remove & Discard patch within 12 hours or as directed by MD   losartan (COZAAR) 50 MG tablet Take 1 tablet (50 mg total) by mouth daily.   metFORMIN (GLUCOPHAGE) 500 MG tablet Take 1 tablet (500 mg total) by mouth 2 (two) times daily with a meal.   nitroGLYCERIN (NITROSTAT) 0.4 MG SL tablet Place 1 tablet (0.4 mg total) under the tongue every 5 (five) minutes x 3 doses as needed for chest pain.   rosuvastatin (CRESTOR) 40 MG tablet Take 1 tablet (40 mg total) by mouth daily.   tiZANidine (ZANAFLEX) 4 MG tablet Take 1 tablet (4 mg total) by mouth every 8 (eight) hours as needed for muscle spasms.     Allergies:   Aspirin and Ibuprofen   Past Medical History:  Diagnosis Date   Abdominal aortic aneurysm (AAA) (HCC)    CT 1/22: infrarenal 3.8 cm - repeat US in 2 years // AAA [place] 2/22: AAA 3.8 cm; aorto iliac atherosclerosis without hemodynamically significant stenosis  Aortic atherosclerosis ([place])    CT in 03/2020   CAD (coronary artery disease) 09/2014   a. NSTEMI >> LHC 5/16:  mLAD 15, pRCA 90, mRCA 40, RPLB3 80, EF normal with inf HK >> PCI:  BMS to pRCA and DES to RPLB3 // Cath 11/21: Patent RCA stent, patent RPL stent; medical therapy    GERD (gastroesophageal reflux disease)    History of echocardiogram    Echo 6/16:  EF 50-55%, no RWMA, Gr 1 DD   HLD (hyperlipidemia)    Archie Endo 03/15/2017   HTN (hypertension)    Hx of NSTEMI 5/16 tx with BMS to pRCA and DES to RPLB3 09/10/2014   Hyperglycemia    OSA (obstructive sleep apnea)    "dx'd; couldn't tolerate mask" (03/15/2017)   PAD (peripheral artery disease) ([place])    AAA [place] in 06/2020: aorto iliac atherosclerosis   Tobacco use    Type II diabetes mellitus ([place])    Varicose veins of both lower extremities    S/P ablation 02/2017    Past Surgical History:  Procedure Laterality Date   CARDIAC CATHETERIZATION N/A 09/11/2014   Procedure: Left Heart  Cath and Coronary Angiography;  Surgeon: Leonie Man, MD;  Location: [facility] INVASIVE CV LAB CUPID;  Service: Cardiovascular;  Laterality: N/A;   CARDIAC CATHETERIZATION  09/11/2014   Procedure: Coronary Stent Intervention;  Surgeon: Leonie Man, MD;  Location: [facility] INVASIVE CV LAB CUPID;  Service: Cardiovascular;;   ENDOVENOUS ABLATION SAPHENOUS VEIN W/ LASER Right 02/22/2017   endovenous laser ablation R GSV and stab phlebectomy >20 incisions R leg by Tinnie Gens MD    LEFT HEART CATH AND CORONARY ANGIOGRAPHY N/A 04/01/2020   Procedure: LEFT HEART CATH AND CORONARY ANGIOGRAPHY;  Surgeon: Lorretta Harp, MD;  Location: [place] CV LAB;  Service: Cardiovascular;  Laterality: N/A;     Social History:  The patient  reports that he has been smoking cigarettes. He started smoking about 46 years ago. He has a 40.00 pack-year smoking history. He has quit using smokeless tobacco.  His smokeless tobacco use included chew. He reports current alcohol use of about 6.0 standard drinks of alcohol per week. He reports that he does not use drugs.   Family History:  The patient's family history includes Cancer in his maternal grandfather; Heart attack in his maternal grandmother, maternal uncle, and paternal grandmother.    ROS:  Please see the history of present illness. All other systems are reviewed and  Negative to the above problem except as noted.    PHYSICAL EXAM: VS:  BP 130/72   Pulse 78   Ht 6\' 3"  (1.905 m)   Wt (!) 309 lb 6.4 oz (140.3 kg)   SpO2 92%   BMI 38.67 kg/m   GEN: Morbidly obese 62 yo in no acute distress  HEENT: normal  Neck:  JVP not elevated  No bruit Cardiac: RRR; no murmur   No LE  edema   Respiratory:  clear to auscultation bilaterally, GI: soft, obese No hepatomegaly   EKG:  EKG is not done    Lipid Panel    Component Value Date/Time   CHOL 99 (L) 03/30/2022 1001   TRIG 147 03/30/2022 1001   HDL 29 (L) 03/30/2022 1001   CHOLHDL 3.4 03/30/2022 1001    CHOLHDL 6.8 03/16/2017 0439   VLDL 33 03/16/2017 0439   LDLCALC 44 03/30/2022 1001      Wt Readings from Last 3 Encounters:  08/06/22 (!) 309 lb 6.4  oz (140.3 kg)  08/05/22 (!) 311 lb 3.2 oz (141.2 kg)  04/22/22 (!) 306 lb 9.6 oz (139.1 kg)      ASSESSMENT AND PLAN:  1  CADDoing well  No symptoms of angina   Follow    2  HTN blood pressure Is OK   I would not push further given events with hypotension and renal insuff     3  HL   last lipids LDL 44  HDL 29 (Nov 2023)     4  Hx HFpEF  Volume is OK  5  AAA  Will set up for USN to reevaluate  6  PAD   USN pending of legs next week  Follow with Adora Fridge  7  Pulmonary   Will contact R Byrum for follow up     8  DM  last A1C 6.9   Keep working on diet   Check Vit D        Current medicines are reviewed at length with the patient today.  The patient does not have concerns regarding medicines.  Signed, Dorris Carnes, MD  08/06/2022 10:25 AM    [address], [place], [place]  [address] Phone: [phone]; Fax: [phone]

## 2022-08-07 ENCOUNTER — Telehealth: Payer: Self-pay

## 2022-08-07 DIAGNOSIS — Z79899 Other long term (current) drug therapy: Secondary | ICD-10-CM

## 2022-08-07 LAB — VITAMIN D 25 HYDROXY (VIT D DEFICIENCY, FRACTURES): Vit D, 25-Hydroxy: 12.5 ng/mL — ABNORMAL LOW (ref 30.0–100.0)

## 2022-08-07 MED ORDER — VITAMIN A 7.5 MG (25000 UT) PO CAPS: 50000.0000 [IU] | ORAL_CAPSULE | ORAL | Status: AC

## 2022-08-07 NOTE — Telephone Encounter (Signed)
The patient has been notified of the result and verbalized understanding.  All questions (if any) were answered. Bernestine Amass, RN 08/07/2022 4:16 PM

## 2022-08-07 NOTE — Telephone Encounter (Signed)
-----   Message from [place], MD sent at 08/07/2022  1:49 PM EDT ----- Vit D is very very low  12.5  Goal  over 50 Recomm 50,000 U weekly   Check Vit D level in 4 months

## 2022-08-10 ENCOUNTER — Other Ambulatory Visit: Payer: Self-pay

## 2022-08-11 ENCOUNTER — Other Ambulatory Visit: Payer: Self-pay

## 2022-08-18 ENCOUNTER — Telehealth: Payer: Self-pay | Admitting: Emergency Medicine

## 2022-08-18 ENCOUNTER — Encounter (HOSPITAL_COMMUNITY): Payer: Self-pay

## 2022-08-18 ENCOUNTER — Inpatient Hospital Stay (HOSPITAL_COMMUNITY): Admission: RE | Admit: 2022-08-18 | Payer: Self-pay | Source: Ambulatory Visit

## 2022-08-18 ENCOUNTER — Other Ambulatory Visit: Payer: Self-pay | Admitting: *Deleted

## 2022-08-18 NOTE — Telephone Encounter (Signed)
I will give him a return call. We documented a note in the [facility] referral.

## 2022-08-18 NOTE — Telephone Encounter (Signed)
Pt called the office stating that he missed a call from our office. Upon reviewing pt's chart, I do not see any notes where anyone tried to call the pt. I did look at pt's last OV with Dr. Delton Coombes and came across these instructions:   Instructions  We reviewed your CT scan from January 2023 today.  Pulmonary nodules have been stable. We will refer you to the lung cancer screening program, first dedicated scan to be done in January 2024 Continue to work on decreasing your cigarettes.  Ultimate goal would be to stop altogether. Agree with continuing to work on your exercise and conditioning We will hold off on performing breathing test for now.  You may benefit from this or from inhaler medication at some point in the future. We can revisit the possible benefits of treating your sleep apnea in the future.   Follow Dr. Delton Coombes in January 2024 after your CT so we can review the results together.      Do not see where pt has had the lung cancer screening visit yet. Routing to lung nodule pool to see if they were the ones that had tried to call pt.  Pt has not been scheduled for a follow up with Dr. Delton Coombes so after pt has next CT, pt will need to have an OV scheduled with Dr. Delton Coombes.

## 2022-08-18 NOTE — Progress Notes (Signed)
error 

## 2022-08-19 ENCOUNTER — Other Ambulatory Visit: Payer: Self-pay | Admitting: *Deleted

## 2022-08-19 DIAGNOSIS — Z122 Encounter for screening for malignant neoplasm of respiratory organs: Secondary | ICD-10-CM

## 2022-08-19 DIAGNOSIS — Z87891 Personal history of nicotine dependence: Secondary | ICD-10-CM

## 2022-08-19 DIAGNOSIS — F17210 Nicotine dependence, cigarettes, uncomplicated: Secondary | ICD-10-CM

## 2022-08-20 ENCOUNTER — Ambulatory Visit (HOSPITAL_BASED_OUTPATIENT_CLINIC_OR_DEPARTMENT_OTHER)
Admission: RE | Admit: 2022-08-20 | Discharge: 2022-08-20 | Disposition: A | Discharge status: Home / Self Care | Payer: Medicaid Other | Source: Ambulatory Visit | Attending: Cardiovascular Disease | Admitting: Cardiovascular Disease

## 2022-08-20 ENCOUNTER — Telehealth: Payer: Self-pay

## 2022-08-20 ENCOUNTER — Ambulatory Visit (HOSPITAL_COMMUNITY)
Admission: RE | Admit: 2022-08-20 | Discharge: 2022-08-20 | Disposition: A | Discharge status: Home / Self Care | Payer: Medicaid Other | Source: Ambulatory Visit | Attending: Physician Assistant | Admitting: Physician Assistant

## 2022-08-20 DIAGNOSIS — R531 Weakness: Secondary | ICD-10-CM | POA: Diagnosis not present

## 2022-08-20 DIAGNOSIS — I714 Abdominal aortic aneurysm, without rupture, unspecified: Secondary | ICD-10-CM | POA: Insufficient documentation

## 2022-08-20 DIAGNOSIS — I739 Peripheral vascular disease, unspecified: Secondary | ICD-10-CM | POA: Diagnosis not present

## 2022-08-20 DIAGNOSIS — I7143 Infrarenal abdominal aortic aneurysm, without rupture: Secondary | ICD-10-CM | POA: Diagnosis present

## 2022-08-20 NOTE — Telephone Encounter (Signed)
-----   Message from Dietrich Pates V, MD sent at 08/20/2022  1:04 PM EDT ----- Aorta measures 4.2    This is larger than one year ago when it was 3.8    Given change in size I would recomm referral to [facility] so that it can be followed closely in case an intervention is needed in future

## 2022-08-20 NOTE — Telephone Encounter (Signed)
Pt verbalized understanding and agrees to referral to [facility].

## 2022-08-20 NOTE — Telephone Encounter (Signed)
Left a message for the pt to call back.  

## 2022-08-21 LAB — VAS US ABI WITH/WO TBI
Left ABI: 0.93
Right ABI: 0.88

## 2022-08-24 ENCOUNTER — Other Ambulatory Visit: Payer: Self-pay

## 2022-08-28 ENCOUNTER — Other Ambulatory Visit: Payer: Self-pay

## 2022-08-28 ENCOUNTER — Ambulatory Visit (INDEPENDENT_AMBULATORY_CARE_PROVIDER_SITE_OTHER): Payer: Medicaid Other | Admitting: Vascular Surgery

## 2022-08-28 ENCOUNTER — Encounter: Payer: Self-pay | Admitting: Vascular Surgery

## 2022-08-28 VITALS — BP 164/94 | HR 75 | Temp 97.2°F | Resp 18 | Ht 76.0 in | Wt 312.0 lb

## 2022-08-28 DIAGNOSIS — I714 Abdominal aortic aneurysm, without rupture, unspecified: Secondary | ICD-10-CM | POA: Diagnosis not present

## 2022-08-28 NOTE — Progress Notes (Signed)
Patient ID: William Young, male   DOB: [birth year], 62 y.o.   MRN: [id number]  Reason for Consult: No chief complaint on file.   Referred by Claiborne Rigg, NP  Subjective:     HPI:  William Young is a 62 y.o. male with history of abdominal aortic aneurysm recently noted to be over 4 cm.  He does have abdominal pain but this is not there is no new back pain.  He has risk factors for vascular disease of type 2 diabetes, hypertension, hyperlipidemia and coronary artery disease and he is a longtime smoker.  He did have varicose veins treated here in the past with ablation 6 years ago does not have any ulceration at this time does have some recurrence of varicose veins in the right medial calf but did not give him any issues.  He walks with some limitation unable to know if this is due to claudication.  Past Medical History:  Diagnosis Date   Abdominal aortic aneurysm (AAA) (HCC)    CT 1/22: infrarenal 3.8 cm - repeat US in 2 years // AAA [place] 2/22: AAA 3.8 cm; aorto iliac atherosclerosis without hemodynamically significant stenosis   Aortic atherosclerosis (HCC)    CT in 03/2020   CAD (coronary artery disease) 09/2014   a. NSTEMI >> LHC 5/16:  mLAD 15, pRCA 90, mRCA 40, RPLB3 80, EF normal with inf HK >> PCI:  BMS to pRCA and DES to RPLB3 // Cath 11/21: Patent RCA stent, patent RPL stent; medical therapy    GERD (gastroesophageal reflux disease)    History of echocardiogram    Echo 6/16:  EF 50-55%, no RWMA, Gr 1 DD   HLD (hyperlipidemia)    Hattie Perch 03/15/2017   HTN (hypertension)    Hx of NSTEMI 5/16 tx with BMS to pRCA and DES to RPLB3 09/10/2014   Hyperglycemia    OSA (obstructive sleep apnea)    "dx'd; couldn't tolerate mask" (03/15/2017)   PAD (peripheral artery disease) (HCC)    AAA [place] in 06/2020: aorto iliac atherosclerosis   Tobacco use    Type II diabetes mellitus (HCC)    Varicose veins of both lower extremities    S/P ablation 02/2017   Family History  Problem Relation Age  of Onset   Heart attack Maternal Uncle    Heart attack Paternal Grandmother    Cancer Maternal Grandfather    Heart attack Maternal Grandmother    Stroke Neg Hx    Hypertension Neg Hx    Past Surgical History:  Procedure Laterality Date   CARDIAC CATHETERIZATION N/A 09/11/2014   Procedure: Left Heart Cath and Coronary Angiography;  Surgeon: Marykay Lex, MD;  Location: [facility]VASIVE CV [facility];  Service: Cardiovascular;  Laterality: N/A;   CARDIAC CATHETERIZATION  09/11/2014   Procedure: Coronary Stent Intervention;  Surgeon: Marykay Lex, MD;  Location: [facility];  Service: Cardiovascular;;   ENDOVENOUS ABLATION SAPHENOUS VEIN W/ LASER Right 02/22/2017   endovenous laser ablation R GSV and stab phlebectomy >20 incisions R leg by Josephina Gip MD    LEFT HEART CATH AND CORONARY ANGIOGRAPHY N/A 04/01/2020   Procedure: LEFT HEART CATH AND CORONARY ANGIOGRAPHY;  Surgeon: Runell Gess, MD;  Location: [facility] INVASIVE CV LAB;  Service: Cardiovascular;  Laterality: N/A;    Short Social History:  Social History   Tobacco Use   Smoking status: Every Day    Packs/day: 1.00    Years: 40.00    Additional pack  years: 0.00    Total pack years: 40.00    Types: Cigarettes    Start date: 44   Smokeless tobacco: Former    Types: Chew   Tobacco comments:    Smokes 5 packs a week. Tay 11/27/21  Substance Use Topics   Alcohol use: Yes    Alcohol/week: 6.0 standard drinks of alcohol    Types: 6 Cans of beer per week    Allergies  Allergen Reactions   Aspirin Anaphylaxis, Swelling and Rash    Throat closes    Ibuprofen Anaphylaxis, Swelling and Rash    Current Outpatient Medications  Medication Sig Dispense Refill   Blood Glucose Monitoring Suppl (TRUE METRIX METER) w/Device KIT Use as instructed. Check blood glucose level by fingerstick twice per day. E11.65 1 kit 0   clopidogrel (PLAVIX) 75 MG tablet Take 1 tablet (75 mg total) by mouth daily. 90 tablet 3    empagliflozin (JARDIANCE) 25 MG TABS tablet Take 1 tablet (25 mg total) by mouth daily before breakfast. 90 tablet 3   ezetimibe (ZETIA) 10 MG tablet Take 1 tablet (10 mg total) by mouth daily. 90 tablet 3   fluticasone furoate-vilanterol (BREO ELLIPTA) 200-25 MCG/ACT AEPB Inhale 1 puff into the lungs daily. 60 each 11   gabapentin (NEURONTIN) 300 MG capsule Take 1 capsule (300 mg total) by mouth at bedtime. 90 capsule 1   glimepiride (AMARYL) 4 MG tablet Take 2 tablets (8 mg total) by mouth daily with breakfast. 90 tablet 3   lidocaine (LIDODERM) 5 % Place 1 patch onto the skin daily. Remove & Discard patch within 12 hours or as directed by MD 30 patch 0   losartan (COZAAR) 50 MG tablet Take 1 tablet (50 mg total) by mouth daily. 90 tablet 3   metFORMIN (GLUCOPHAGE) 500 MG tablet Take 1 tablet (500 mg total) by mouth 2 (two) times daily with a meal. 180 tablet 1   nitroGLYCERIN (NITROSTAT) 0.4 MG SL tablet Place 1 tablet (0.4 mg total) under the tongue every 5 (five) minutes x 3 doses as needed for chest pain. 25 tablet 12   rosuvastatin (CRESTOR) 40 MG tablet Take 1 tablet (40 mg total) by mouth daily. 30 tablet 3   tiZANidine (ZANAFLEX) 4 MG tablet Take 1 tablet (4 mg total) by mouth every 8 (eight) hours as needed for muscle spasms. 60 tablet 2   Vitamin A 7.5 MG (25000 UT) CAPS Take 50,000 Units by mouth once a week. 30 capsule    No current facility-administered medications for this visit.    Review of Systems  Constitutional:  Constitutional negative. HENT: HENT negative.  Respiratory: Respiratory negative.  GI: Positive for abdominal pain.  Musculoskeletal: Positive for leg pain.  Neurological: Neurological negative. Hematologic: Hematologic/lymphatic negative.  Psychiatric: Psychiatric negative.        Objective:  Objective  Vitals:   08/28/22 0929  BP: (!) 164/94  Pulse: 75  Resp: 18  Temp: (!) 97.2 F (36.2 C)  SpO2: 95%     Physical Exam HENT:     Head:  Normocephalic.     Nose: Nose normal.  Eyes:     Pupils: Pupils are equal, round, and reactive to light.  Cardiovascular:     Comments: I cannot reliably feel femoral pulses but may be due to the patient's body habitus Pulmonary:     Effort: Pulmonary effort is normal.  Abdominal:     General: Abdomen is flat.     Palpations: Abdomen is soft.  Musculoskeletal:     Right lower leg: No edema.     Left lower leg: No edema.  Skin:    Comments: Bilateral ankle skin changes consistent with C4 venous disease  Neurological:     General: No focal deficit present.     Mental Status: He is alert.  Psychiatric:        Mood and Affect: Mood normal.     Data: Abdominal Aorta Findings:  +-----------+-------+----------+----------+-----------+--------+-----------  --+  Location  AP (cm)Trans (cm)PSV (cm/s)Waveform   ThrombusComments        +-----------+-------+----------+----------+-----------+--------+-----------  --+  Proximal  2.50   2.50      107                                          +-----------+-------+----------+----------+-----------+--------+-----------  --+  Mid       3.10   3.10      83                           fusiform        +-----------+-------+----------+----------+-----------+--------+-----------  --+  Distal    4.20   4.20      96                           fusiform        +-----------+-------+----------+----------+-----------+--------+-----------  --+  RT CIA Prox1.5    1.5       162       multiphasic        not at  ostium  +-----------+-------+----------+----------+-----------+--------+-----------  --+  RT EIA Prox1.1    1.1       147       biphasic           not at  ostium  +-----------+-------+----------+----------+-----------+--------+-----------  --+  LT CIA Prox1.5    1.5       190       biphasic           not at  ostium  +-----------+-------+----------+----------+-----------+--------+-----------   --+  LT EIA Prox1.2    1.2       181       multiphasic                        +-----------+-------+----------+----------+-----------+--------+-----------  --+     IVC/Iliac Findings:  +--------+------+--------+--------+   IVC   PatentThrombusComments  +--------+------+--------+--------+  IVC Proxpatent                  +--------+------+--------+--------+             Summary:  Abdominal Aorta: There is evidence of abnormal dilatation of the mid and  distal Abdominal aorta. The largest aortic measurement is 4.2 cm. The  largest aortic diameter has increased compared to prior exam. Previous  diameter measurement was 3.8 cm obtained      Assessment/Plan:    62 year old male with enlarging abdominal aortic aneurysm now measuring 4.2 cm by recent duplex.  I was able to review an old CT scan and it has increased in size approximately half centimeter in the past year and a half at least.  He also has significant fibrofatty plaques with throughout his aorta as well as bifurcation calcifications which describes his lack of femoral pulses and mild decrease  in ABIs.  I recommended smoking cessation.  We discussed the signs and symptoms of rupture as well as the natural history of aneurysm disease short of this he will be back in 1 year with repeat duplex.  I discussed the need for CT scan with the duplex measuring 5 cm and demonstrates good understanding with his significant other today.    Maeola Harman MD [facility]

## 2022-08-31 ENCOUNTER — Other Ambulatory Visit: Payer: Self-pay

## 2022-09-08 ENCOUNTER — Other Ambulatory Visit: Payer: Self-pay | Admitting: Nurse Practitioner

## 2022-09-08 ENCOUNTER — Other Ambulatory Visit: Payer: Self-pay

## 2022-09-08 ENCOUNTER — Other Ambulatory Visit: Payer: Self-pay | Admitting: Physician Assistant

## 2022-09-08 DIAGNOSIS — E785 Hyperlipidemia, unspecified: Secondary | ICD-10-CM

## 2022-09-08 MED ORDER — ROSUVASTATIN CALCIUM 40 MG PO TABS
40.0000 mg | ORAL_TABLET | Freq: Every day | ORAL | 3 refills | Status: DC
Start: 2022-09-08 — End: 2023-01-10
  Filled 2022-09-08: qty 30, 30d supply, fill #0
  Filled 2022-10-10: qty 30, 30d supply, fill #1
  Filled 2022-11-08: qty 30, 30d supply, fill #2
  Filled 2022-12-07: qty 30, 30d supply, fill #3

## 2022-09-08 MED ORDER — CLOPIDOGREL BISULFATE 75 MG PO TABS
75.0000 mg | ORAL_TABLET | Freq: Every day | ORAL | 3 refills | Status: DC
  Filled 2022-09-08: qty 90, 90d supply, fill #0
  Filled 2022-12-07: qty 90, 90d supply, fill #1
  Filled 2023-03-10: qty 90, 90d supply, fill #2
  Filled 2023-06-11: qty 90, 90d supply, fill #3

## 2022-09-09 ENCOUNTER — Ambulatory Visit (INDEPENDENT_AMBULATORY_CARE_PROVIDER_SITE_OTHER): Payer: Medicaid Other | Admitting: Acute Care

## 2022-09-09 ENCOUNTER — Other Ambulatory Visit: Payer: Self-pay

## 2022-09-09 ENCOUNTER — Encounter: Payer: Self-pay | Admitting: Acute Care

## 2022-09-09 DIAGNOSIS — F17210 Nicotine dependence, cigarettes, uncomplicated: Secondary | ICD-10-CM | POA: Diagnosis not present

## 2022-09-09 NOTE — Progress Notes (Signed)
Virtual Visit via Telephone Note  I connected with Darrin Nipper on 09/09/22 at 10:00 AM EDT by telephone and verified that I am speaking with the correct person using two identifiers.  Location: Patient:  At home Provider: [address], [place], [place], [address]    I discussed the limitations, risks, security and privacy concerns of performing an evaluation and management service by telephone and the availability of in person appointments. I also discussed with the patient that there may be a patient responsible charge related to this service. The patient expressed understanding and agreed to proceed.   Shared Decision Making Visit Lung Cancer Screening Program [phone])   Eligibility: Age 62 y.o. Pack Years Smoking History Calculation 45 pack year  (# packs/per year x # years smoked) Recent History of coughing up blood  no Unexplained weight loss? no ( >Than 15 pounds within the last 6 months ) Prior History Lung / other cancer no (Diagnosis within the last 5 years already requiring surveillance chest CT Scans). Smoking Status Current Smoker Former Smokers: Years since quit:  NA  Quit Date:  NA  Visit Components: Discussion included one or more decision making aids. yes Discussion included risk/benefits of screening. yes Discussion included potential follow up diagnostic testing for abnormal scans. yes Discussion included meaning and risk of over diagnosis. yes Discussion included meaning and risk of False Positives. yes Discussion included meaning of total radiation exposure. yes  Counseling Included: Importance of adherence to annual lung cancer LDCT screening. yes Impact of comorbidities on ability to participate in the program. yes Ability and willingness to under diagnostic treatment. yes  Smoking Cessation Counseling: Current Smokers:  Discussed importance of smoking cessation. yes Information about tobacco cessation classes and interventions provided to  patient. yes Patient provided with "ticket" for LDCT Scan.  NA Symptomatic Patient. no  Counseling NA Diagnosis Code: Tobacco Use Z72.0 Asymptomatic Patient yes  Counseling (Intermediate counseling: > three minutes counseling) U0454 Former Smokers:  Discussed the importance of maintaining cigarette abstinence. yes Diagnosis Code: Personal History of Nicotine Dependence. U98.119 Information about tobacco cessation classes and interventions provided to patient. Yes Patient provided with "ticket" for LDCT Scan. yes Written Order for Lung Cancer Screening with LDCT placed in Epic. Yes (CT Chest Lung Cancer Screening Low Dose W/O CM) [id number] Z12.2-Screening of respiratory organs Z87.891-Personal history of nicotine dependence  I have spent 25 minutes of face to face/ virtual visit   time with  Mr. Leete discussing the risks and benefits of lung cancer screening. We viewed / discussed a power point together that explained in detail the above noted topics. We paused at intervals to allow for questions to be asked and answered to ensure understanding.We discussed that the single most powerful action that he can take to decrease his risk of developing lung cancer is to quit smoking. We discussed whether or not he is ready to commit to setting a quit date. We discussed options for tools to aid in quitting smoking including nicotine replacement therapy, non-nicotine medications, support groups, Quit Smart classes, and behavior modification. We discussed that often times setting smaller, more achievable goals, such as eliminating 1 cigarette a day for a week and then 2 cigarettes a day for a week can be helpful in slowly decreasing the number of cigarettes smoked. This allows for a sense of accomplishment as well as providing a clinical benefit. I provided  him  with smoking cessation  information  with contact information for community resources, classes, free nicotine  replacement therapy, and access to  mobile apps, text messaging, and on-line smoking cessation help. I have also provided  him  the office contact information in the event he needs to contact me, or the screening staff. We discussed the time and location of the scan, and that either Abigail Miyamoto RN, Karlton Lemon, RN  or I will call / send a letter with the results within 24-72 hours of receiving them. The patient verbalized understanding of all of  the above and had no further questions upon leaving the office. They have my contact information in the event they have any further questions.  I spent 3-4 minutes counseling on smoking cessation and the health risks of continued tobacco abuse.  I explained to the patient that there has been a high incidence of coronary artery disease noted on these exams. I explained that this is a non-gated exam therefore degree or severity cannot be determined. This patient is on statin therapy. I have asked the patient to follow-up with their PCP regarding any incidental finding of coronary artery disease and management with diet or medication as their PCP  feels is clinically indicated. The patient verbalized understanding of the above and had no further questions upon completion of the visit.      Bevelyn Ngo, NP 09/09/2022

## 2022-09-09 NOTE — Patient Instructions (Signed)
Thank you for participating in the [facility]. It was our pleasure to meet you today. We will call you with the results of your scan within the next few days. Your scan will be assigned a Lung RADS category score by the physicians reading the scans.  This Lung RADS score determines follow up scanning.  See below for description of categories, and follow up screening recommendations. We will be in touch to schedule your follow up screening annually or based on recommendations of our providers. We will fax a copy of your scan results to your Primary Care Physician, or the physician who referred you to the program, to ensure they have the results. Please call the office if you have any questions or concerns regarding your scanning experience or results.  Our office number is [phone]. Please speak with Denise Phelps, RN. , or  Denise Buckner RN, They are  our Lung Cancer Screening RN.'s If They are unavailable when you call, Please leave a message on the voice mail. We will return your call at our earliest convenience.This voice mail is monitored several times a day.  Remember, if your scan is normal, we will scan you annually as long as you continue to meet the criteria for the program. (Age 50-80, Current smoker or smoker who has quit within the last 15 years). If you are a smoker, remember, quitting is the single most powerful action that you can take to decrease your risk of lung cancer and other pulmonary, breathing related problems. We know quitting is hard, and we are here to help.  Please let us know if there is anything we can do to help you meet your goal of quitting. If you are a former smoker, congratulations. We are proud of you! Remain smoke free! Remember you can refer friends or family members through the number above.  We will screen them to make sure they meet criteria for the program. Thank you for helping us take better care of you by  participating in Lung Screening.  You can receive free nicotine replacement therapy ( patches, gum or mints) by calling 1-800-QUIT NOW. Please call so we can get you on the path to becoming  a non-smoker. I know it is hard, but you can do this!  Lung RADS Categories:  Lung RADS 1: no nodules or definitely non-concerning nodules.  Recommendation is for a repeat annual scan in 12 months.  Lung RADS 2:  nodules that are non-concerning in appearance and behavior with a very low likelihood of becoming an active cancer. Recommendation is for a repeat annual scan in 12 months.  Lung RADS 3: nodules that are probably non-concerning , includes nodules with a low likelihood of becoming an active cancer.  Recommendation is for a 6-month repeat screening scan. Often noted after an upper respiratory illness. We will be in touch to make sure you have no questions, and to schedule your 6-month scan.  Lung RADS 4 A: nodules with concerning findings, recommendation is most often for a follow up scan in 3 months or additional testing based on our provider's assessment of the scan. We will be in touch to make sure you have no questions and to schedule the recommended 3 month follow up scan.  Lung RADS 4 B:  indicates findings that are concerning. We will be in touch with you to schedule additional diagnostic testing based on our provider's  assessment of the scan.  Other options for assistance in smoking cessation (   As covered by your insurance benefits)  Hypnosis for smoking cessation  [employer] [phone]  Acupuncture for smoking cessation  [facility] [phone]   

## 2022-09-10 ENCOUNTER — Ambulatory Visit
Admission: RE | Admit: 2022-09-10 | Discharge: 2022-09-10 | Disposition: A | Discharge status: Home / Self Care | Payer: Medicaid Other | Source: Ambulatory Visit | Attending: Acute Care | Admitting: Acute Care

## 2022-09-10 DIAGNOSIS — Z87891 Personal history of nicotine dependence: Secondary | ICD-10-CM

## 2022-09-10 DIAGNOSIS — Z122 Encounter for screening for malignant neoplasm of respiratory organs: Secondary | ICD-10-CM

## 2022-09-10 DIAGNOSIS — F17210 Nicotine dependence, cigarettes, uncomplicated: Secondary | ICD-10-CM

## 2022-09-14 ENCOUNTER — Other Ambulatory Visit: Payer: Self-pay

## 2022-09-15 ENCOUNTER — Other Ambulatory Visit: Payer: Self-pay

## 2022-09-15 DIAGNOSIS — Z122 Encounter for screening for malignant neoplasm of respiratory organs: Secondary | ICD-10-CM

## 2022-09-15 DIAGNOSIS — Z87891 Personal history of nicotine dependence: Secondary | ICD-10-CM

## 2022-09-15 DIAGNOSIS — F17210 Nicotine dependence, cigarettes, uncomplicated: Secondary | ICD-10-CM

## 2022-10-01 ENCOUNTER — Other Ambulatory Visit: Payer: Self-pay

## 2022-10-01 ENCOUNTER — Other Ambulatory Visit: Payer: Self-pay | Admitting: Family Medicine

## 2022-10-01 DIAGNOSIS — M519 Unspecified thoracic, thoracolumbar and lumbosacral intervertebral disc disorder: Secondary | ICD-10-CM

## 2022-10-01 MED ORDER — TIZANIDINE HCL 4 MG PO TABS
4.0000 mg | ORAL_TABLET | Freq: Three times a day (TID) | ORAL | 0 refills | Status: DC | PRN
Start: 2022-10-01 — End: 2022-11-25
  Filled 2022-10-01: qty 60, 20d supply, fill #0

## 2022-10-08 ENCOUNTER — Other Ambulatory Visit: Payer: Self-pay

## 2022-10-09 ENCOUNTER — Other Ambulatory Visit: Payer: Self-pay

## 2022-11-09 ENCOUNTER — Other Ambulatory Visit: Payer: Self-pay

## 2022-11-10 ENCOUNTER — Other Ambulatory Visit: Payer: Self-pay

## 2022-11-10 ENCOUNTER — Ambulatory Visit
Admission: RE | Admit: 2022-11-10 | Discharge: 2022-11-10 | Disposition: A | Discharge status: Home / Self Care | Payer: Medicaid Other | Source: Ambulatory Visit | Attending: Nurse Practitioner | Admitting: Nurse Practitioner

## 2022-11-10 ENCOUNTER — Ambulatory Visit: Payer: Medicaid Other | Attending: Nurse Practitioner | Admitting: Nurse Practitioner

## 2022-11-10 ENCOUNTER — Encounter: Payer: Self-pay | Admitting: Nurse Practitioner

## 2022-11-10 VITALS — BP 134/88 | HR 70 | Ht 76.0 in | Wt 315.8 lb

## 2022-11-10 DIAGNOSIS — E1165 Type 2 diabetes mellitus with hyperglycemia: Secondary | ICD-10-CM | POA: Diagnosis not present

## 2022-11-10 DIAGNOSIS — G8929 Other chronic pain: Secondary | ICD-10-CM

## 2022-11-10 DIAGNOSIS — J418 Mixed simple and mucopurulent chronic bronchitis: Secondary | ICD-10-CM

## 2022-11-10 DIAGNOSIS — I10 Essential (primary) hypertension: Secondary | ICD-10-CM

## 2022-11-10 DIAGNOSIS — E559 Vitamin D deficiency, unspecified: Secondary | ICD-10-CM

## 2022-11-10 DIAGNOSIS — Z7984 Long term (current) use of oral hypoglycemic drugs: Secondary | ICD-10-CM

## 2022-11-10 DIAGNOSIS — M25562 Pain in left knee: Secondary | ICD-10-CM

## 2022-11-10 MED ORDER — FLUTICASONE FUROATE-VILANTEROL 200-25 MCG/ACT IN AEPB
1.0000 | INHALATION_SPRAY | Freq: Every day | RESPIRATORY_TRACT | 11 refills | Status: AC
Start: 2022-11-10 — End: ?
  Filled 2022-11-10: qty 180, 90d supply, fill #0
  Filled 2023-02-11: qty 180, 90d supply, fill #1

## 2022-11-10 MED ORDER — ACETAMINOPHEN-CODEINE 300-30 MG PO TABS
1.0000 | ORAL_TABLET | Freq: Every day | ORAL | 0 refills | Status: DC | PRN
Start: 2022-11-10 — End: 2022-12-14
  Filled 2022-11-10: qty 30, 30d supply, fill #0

## 2022-11-10 NOTE — Progress Notes (Signed)
Assessment & Plan:  Calven was seen today for hypertension and knee pain.  Diagnoses and all orders for this visit:  Type 2 diabetes mellitus with hyperglycemia, without long-term current use of insulin (HCC) -     Ambulatory referral to Ophthalmology -     Hemoglobin A1c -     CMP14+EGFR Continue blood sugar control as discussed in office today, low carbohydrate diet, and regular physical exercise as tolerated, 150 minutes per week (30 min each day, 5 days per week, or 50 min 3 days per week). Keep blood sugar logs with fasting goal of 90-130 mg/dl, post prandial (after you eat) less than 180.  For Hypoglycemia: BS <60 and Hyperglycemia BS >400; contact the clinic ASAP. Annual eye exams and foot exams are recommended.   Primary hypertension Continue all antihypertensives as prescribed.  Reminded to bring in blood pressure log for follow  up appointment.  RECOMMENDATIONS: DASH/Mediterranean Diets are healthier choices for HTN.    Chronic pain of left knee Recommend copper knee sleeve -     DG Knee Complete 4 Views Left; Future Work on losing weight to help reduce joint pain. May alternate with heat and ice application for pain relief. May also alternate with acetaminophen as prescribed pain relief. Other alternatives include massage, acupuncture and water aerobics.     Vitamin D deficiency disease -     VITAMIN D 25 Hydroxy (Vit-D Deficiency, Fractures)  Chronic obstructive pulmonary disease Encourage to stop smoking -     fluticasone furoate-vilanterol (BREO ELLIPTA) 200-25 MCG/ACT AEPB; Inhale 1 puff into the lungs daily.    Patient has been counseled on age-appropriate routine health concerns for screening and prevention. These are reviewed and up-to-date. Referrals have been placed accordingly. Immunizations are up-to-date or declined.    Subjective:   Chief Complaint  Patient presents with   Hypertension         Knee Pain    Left knee, bone on bone.     HPI William Young 62 y.o. male presents to office today for follow up to HTN and with left knee pain.  He has a past medical history of Abdominal aortic aneurysm, Aortic atherosclerosis, CAD (09/2014), GERD, History of echocardiogram, HLD, HTN,  Hx of NSTEMI 5/16 tx with BMS to [facility] and DES to RPLB3 (09/10/2014), Hyperglycemia, OSA, Pulmonary nodules, COPD, PAD, Tobacco use, Type II diabetes mellitus, and Varicose veins of both lower extremities.     HTN Blood pressure is well controlled with chlorthalidone 25 mg daily and losartan 100 mg daily  BP Readings from Last 3 Encounters:  11/10/22 134/88  08/28/22 (!) 164/94  08/06/22 130/72    COPD: Patient complains of cough with specks of blood x2. Symptoms began a few months ago. Symptoms cough productive of mucoid sputum in small amounts does not worsen with exertion. Sputum is clear in small amounts. Fever has been  absent . Patient uses a few pillows at night. Respiratory history: COPD  He is a smoker and smokes a over half a ppd or 5 packs of cigarettes per week. Working at [employer] and feels the fumes may be contributing to his cough. Aggravating factors: lying down.      DM 2 Diabetes at goal of < 7. Currently prescribed glimepiride  8 mg daily. Jardiance 25 mg daily and metformin 500 mg BID. Lab Results  Component Value Date   HGBA1C 6.9 (A) 08/05/2022  LDL at goal.  Lab Results  Component Value Date   CHOL  99 (L) 03/30/2022   CHOL 99 (L) 12/03/2020   CHOL 142 08/30/2020   Lab Results  Component Value Date   HDL 29 (L) 03/30/2022   HDL 38 (L) 12/03/2020   HDL 38 (L) 08/30/2020   Lab Results  Component Value Date   LDLCALC 44 03/30/2022   LDLCALC 46 12/03/2020   LDLCALC 82 08/30/2020   Lab Results  Component Value Date   TRIG 147 03/30/2022   TRIG 70 12/03/2020   TRIG 121 08/30/2020   Lab Results  Component Value Date   CHOLHDL 3.4 03/30/2022   CHOLHDL 2.6 12/03/2020   CHOLHDL 3.7 08/30/2020     Knee  pain Seve chronic pain or painmonths ago. Has worsened over the past  Feels like bone against bone. Does a lot of standing at work.  Knee Pain: Patient presents with knee pain involving the  left knee. Onset of the symptoms was several months ago. Inciting event: none known. Current symptoms include crepitus sensation, popping sensation, and stiffness. Pain is aggravated by any weight bearing, going up and down stairs, and walking.  Patient has had prior knee problems. Evaluation to date: plain films: normal 2016. Treatment to date: avoidance of offending activity.    Review of Systems  Constitutional:  Negative for fever, malaise/fatigue and weight loss.  HENT: Negative.  Negative for nosebleeds.   Eyes: Negative.  Negative for blurred vision, double vision and photophobia.  Respiratory:  Positive for sputum production (chronic). Negative for cough and shortness of breath.   Cardiovascular: Negative.  Negative for chest pain, palpitations and leg swelling.  Gastrointestinal: Negative.  Negative for heartburn, nausea and vomiting.  Musculoskeletal:  Positive for joint pain. Negative for myalgias.  Neurological: Negative.  Negative for dizziness, focal weakness, seizures and headaches.  Psychiatric/Behavioral: Negative.  Negative for suicidal ideas.     Past Medical History:  Diagnosis Date   Abdominal aortic aneurysm (AAA) (HCC)    CT 1/22: infrarenal 3.8 cm - repeat US in 2 years // AAA [place] 2/22: AAA 3.8 cm; aorto iliac atherosclerosis without hemodynamically significant stenosis   Aortic atherosclerosis (HCC)    CT in 03/2020   CAD (coronary artery disease) 09/2014   a. NSTEMI >> LHC 5/16:  mLAD 15, pRCA 90, mRCA 40, RPLB3 80, EF normal with inf HK >> PCI:  BMS to pRCA and DES to RPLB3 // Cath 11/21: Patent RCA stent, patent RPL stent; medical therapy    GERD (gastroesophageal reflux disease)    History of echocardiogram    Echo 6/16:  EF 50-55%, no RWMA, Gr 1 DD   HLD (hyperlipidemia)     Hattie Perch 03/15/2017   HTN (hypertension)    Hx of NSTEMI 5/16 tx with BMS to pRCA and DES to RPLB3 09/10/2014   Hyperglycemia    OSA (obstructive sleep apnea)    "dx'd; couldn't tolerate mask" (03/15/2017)   PAD (peripheral artery disease) (HCC)    AAA [place] in 06/2020: aorto iliac atherosclerosis   Tobacco use    Type II diabetes mellitus (HCC)    Varicose veins of both lower extremities    S/P ablation 02/2017    Past Surgical History:  Procedure Laterality Date   CARDIAC CATHETERIZATION N/A 09/11/2014   Procedure: Left Heart Cath and Coronary Angiography;  Surgeon: Marykay Lex, MD;  Location: [facility] INVASIVE CV LAB CUPID;  Service: Cardiovascular;  Laterality: N/A;   CARDIAC CATHETERIZATION  09/11/2014   Procedure: Coronary Stent Intervention;  Surgeon: Marykay Lex, MD;  Location: [facility] INVASIVE CV LAB CUPID;  Service: Cardiovascular;;   ENDOVENOUS ABLATION SAPHENOUS VEIN W/ LASER Right 02/22/2017   endovenous laser ablation R GSV and stab phlebectomy >20 incisions R leg by Josephina Gip MD    LEFT HEART CATH AND CORONARY ANGIOGRAPHY N/A 04/01/2020   Procedure: LEFT HEART CATH AND CORONARY ANGIOGRAPHY;  Surgeon: Runell Gess, MD;  Location: [facility] INVASIVE CV LAB;  Service: Cardiovascular;  Laterality: N/A;    Family History  Problem Relation Age of Onset   Heart attack Maternal Uncle    Heart attack Paternal Grandmother    Cancer Maternal Grandfather    Heart attack Maternal Grandmother    Stroke Neg Hx    Hypertension Neg Hx     Social History Reviewed with no changes to be made today.   Outpatient Medications Prior to Visit  Medication Sig Dispense Refill   Blood Glucose Monitoring Suppl (TRUE METRIX METER) w/Device KIT Use as instructed. Check blood glucose level by fingerstick twice per day. E11.65 (Patient not taking: Reported on 11/10/2022) 1 kit 0   clopidogrel (PLAVIX) 75 MG tablet Take 1 tablet (75 mg total) by mouth daily. 90 tablet 3   empagliflozin (JARDIANCE) 25 MG TABS  tablet Take 1 tablet (25 mg total) by mouth daily before breakfast. 90 tablet 3   ezetimibe (ZETIA) 10 MG tablet Take 1 tablet (10 mg total) by mouth daily. 90 tablet 3   gabapentin (NEURONTIN) 300 MG capsule Take 1 capsule (300 mg total) by mouth at bedtime. 90 capsule 1   glimepiride (AMARYL) 4 MG tablet Take 2 tablets (8 mg total) by mouth daily with breakfast. 90 tablet 3   lidocaine (LIDODERM) 5 % Place 1 patch onto the skin daily. Remove & Discard patch within 12 hours or as directed by MD 30 patch 0   losartan (COZAAR) 50 MG tablet Take 1 tablet (50 mg total) by mouth daily. 90 tablet 3   metFORMIN (GLUCOPHAGE) 500 MG tablet Take 1 tablet (500 mg total) by mouth 2 (two) times daily with a meal. 180 tablet 1   nitroGLYCERIN (NITROSTAT) 0.4 MG SL tablet Place 1 tablet (0.4 mg total) under the tongue every 5 (five) minutes x 3 doses as needed for chest pain. 25 tablet 12   rosuvastatin (CRESTOR) 40 MG tablet Take 1 tablet (40 mg total) by mouth daily. 30 tablet 3   tiZANidine (ZANAFLEX) 4 MG tablet Take 1 tablet (4 mg total) by mouth every 8 (eight) hours as needed for muscle spasms. 60 tablet 0   Vitamin A 7.5 MG (25000 UT) CAPS Take 50,000 Units by mouth once a week. 30 capsule    fluticasone furoate-vilanterol (BREO ELLIPTA) 200-25 MCG/ACT AEPB Inhale 1 puff into the lungs daily. 60 each 11   No facility-administered medications prior to visit.    Allergies  Allergen Reactions   Aspirin Anaphylaxis, Swelling and Rash    Throat closes    Ibuprofen Anaphylaxis, Swelling and Rash       Objective:    BP 134/88   Pulse 70   Ht 6\' 4"  (1.93 m)   Wt (!) 315 lb 12.8 oz (143.2 kg)   SpO2 97%   BMI 38.44 kg/m  Wt Readings from Last 3 Encounters:  11/10/22 (!) 315 lb 12.8 oz (143.2 kg)  08/28/22 (!) 312 lb (141.5 kg)  08/06/22 (!) 309 lb 6.4 oz (140.3 kg)    Physical Exam Vitals and nursing note reviewed.  Constitutional:  Appearance: He is well-developed.  HENT:     Head:  Normocephalic and atraumatic.  Cardiovascular:     Rate and Rhythm: Normal rate and regular rhythm.     Heart sounds: Normal heart sounds. No murmur heard.    No friction rub. No gallop.  Pulmonary:     Effort: Pulmonary effort is normal. No tachypnea or respiratory distress.     Breath sounds: Normal breath sounds. No decreased breath sounds, wheezing, rhonchi or rales.  Chest:     Chest wall: No tenderness.  Abdominal:     General: Bowel sounds are normal.     Palpations: Abdomen is soft.  Musculoskeletal:        General: Normal range of motion.     Cervical back: Normal range of motion.  Skin:    General: Skin is warm and dry.  Neurological:     Mental Status: He is alert and oriented to person, place, and time.     Coordination: Coordination normal.  Psychiatric:        Behavior: Behavior normal. Behavior is cooperative.        Thought Content: Thought content normal.        Judgment: Judgment normal.          Patient has been counseled extensively about nutrition and exercise as well as the importance of adherence with medications and regular follow-up. The patient was given clear instructions to go to ER or return to medical center if symptoms don't improve, worsen or new problems develop. The patient verbalized understanding.   Follow-up: Return in about 3 months (around 02/10/2023).   Claiborne Rigg, FNP-BC [facility] [place], [place] [phone]   11/10/2022, 12:19 PM

## 2022-11-11 LAB — CMP14+EGFR
ALT: 24 IU/L (ref 0–44)
AST: 19 IU/L (ref 0–40)
Albumin: 4.4 g/dL (ref 3.9–4.9)
Alkaline Phosphatase: 75 IU/L (ref 44–121)
BUN/Creatinine Ratio: 17 (ref 10–24)
BUN: 18 mg/dL (ref 8–27)
Bilirubin Total: 0.6 mg/dL (ref 0.0–1.2)
CO2: 17 mmol/L — ABNORMAL LOW (ref 20–29)
Calcium: 9.1 mg/dL (ref 8.6–10.2)
Chloride: 105 mmol/L (ref 96–106)
Creatinine, Ser: 1.09 mg/dL (ref 0.76–1.27)
Globulin, Total: 2.5 g/dL (ref 1.5–4.5)
Glucose: 148 mg/dL — ABNORMAL HIGH (ref 70–99)
Potassium: 4.9 mmol/L (ref 3.5–5.2)
Sodium: 135 mmol/L (ref 134–144)
Total Protein: 6.9 g/dL (ref 6.0–8.5)
eGFR: 77 mL/min/{1.73_m2} (ref >59)

## 2022-11-11 LAB — HEMOGLOBIN A1C
Est. average glucose Bld gHb Est-mCnc: 146 mg/dL
Hgb A1c MFr Bld: 6.7 % — ABNORMAL HIGH (ref 4.8–5.6)

## 2022-11-11 LAB — VITAMIN D 25 HYDROXY (VIT D DEFICIENCY, FRACTURES): Vit D, 25-Hydroxy: 12.7 ng/mL — ABNORMAL LOW (ref 30.0–100.0)

## 2022-11-13 ENCOUNTER — Other Ambulatory Visit: Payer: Self-pay

## 2022-11-16 ENCOUNTER — Ambulatory Visit: Payer: Self-pay | Admitting: *Deleted

## 2022-11-16 ENCOUNTER — Encounter (HOSPITAL_COMMUNITY): Payer: Self-pay | Admitting: Emergency Medicine

## 2022-11-16 ENCOUNTER — Other Ambulatory Visit: Payer: Self-pay

## 2022-11-16 ENCOUNTER — Other Ambulatory Visit: Payer: Self-pay | Admitting: Family Medicine

## 2022-11-16 ENCOUNTER — Emergency Department (HOSPITAL_COMMUNITY): Payer: Medicaid Other

## 2022-11-16 ENCOUNTER — Emergency Department (HOSPITAL_COMMUNITY)
Admission: EM | Admit: 2022-11-16 | Discharge: 2022-11-16 | Disposition: A | Discharge status: Home / Self Care | Payer: Medicaid Other | Attending: Emergency Medicine | Admitting: Emergency Medicine

## 2022-11-16 DIAGNOSIS — Z87891 Personal history of nicotine dependence: Secondary | ICD-10-CM | POA: Insufficient documentation

## 2022-11-16 DIAGNOSIS — E119 Type 2 diabetes mellitus without complications: Secondary | ICD-10-CM | POA: Diagnosis not present

## 2022-11-16 DIAGNOSIS — Z79899 Other long term (current) drug therapy: Secondary | ICD-10-CM | POA: Insufficient documentation

## 2022-11-16 DIAGNOSIS — I10 Essential (primary) hypertension: Secondary | ICD-10-CM | POA: Insufficient documentation

## 2022-11-16 DIAGNOSIS — Z7902 Long term (current) use of antithrombotics/antiplatelets: Secondary | ICD-10-CM | POA: Insufficient documentation

## 2022-11-16 DIAGNOSIS — R0781 Pleurodynia: Secondary | ICD-10-CM | POA: Diagnosis present

## 2022-11-16 DIAGNOSIS — R21 Rash and other nonspecific skin eruption: Secondary | ICD-10-CM | POA: Insufficient documentation

## 2022-11-16 DIAGNOSIS — R7989 Other specified abnormal findings of blood chemistry: Secondary | ICD-10-CM | POA: Insufficient documentation

## 2022-11-16 DIAGNOSIS — Z7984 Long term (current) use of oral hypoglycemic drugs: Secondary | ICD-10-CM | POA: Diagnosis not present

## 2022-11-16 DIAGNOSIS — R0789 Other chest pain: Secondary | ICD-10-CM

## 2022-11-16 LAB — CBC
HCT: 45.3 % (ref 39.0–52.0)
Hemoglobin: 14.9 g/dL (ref 13.0–17.0)
MCH: 32 pg (ref 26.0–34.0)
MCHC: 32.9 g/dL (ref 30.0–36.0)
MCV: 97.2 fL (ref 80.0–100.0)
Platelets: 143 10*3/uL — ABNORMAL LOW (ref 150–400)
RBC: 4.66 MIL/uL (ref 4.22–5.81)
RDW: 14.4 % (ref 11.5–15.5)
WBC: 5.6 10*3/uL (ref 4.0–10.5)
nRBC: 0 % (ref 0.0–0.2)

## 2022-11-16 LAB — BASIC METABOLIC PANEL
Anion gap: 9 (ref 5–15)
BUN: 21 mg/dL (ref 8–23)
CO2: 18 mmol/L — ABNORMAL LOW (ref 22–32)
Calcium: 8.9 mg/dL (ref 8.9–10.3)
Chloride: 108 mmol/L (ref 98–111)
Creatinine, Ser: 0.93 mg/dL (ref 0.61–1.24)
GFR, Estimated: >60 mL/min (ref >60)
Glucose, Bld: 140 mg/dL — ABNORMAL HIGH (ref 70–99)
Potassium: 4.7 mmol/L (ref 3.5–5.1)
Sodium: 135 mmol/L (ref 135–145)

## 2022-11-16 LAB — TROPONIN I (HIGH SENSITIVITY)
Troponin I (High Sensitivity): 11 ng/L (ref <18)
Troponin I (High Sensitivity): 13 ng/L (ref <18)

## 2022-11-16 LAB — D-DIMER, QUANTITATIVE: D-Dimer, Quant: 0.54 ug/mL-FEU — ABNORMAL HIGH (ref 0.00–0.50)

## 2022-11-16 MED ORDER — IOHEXOL 350 MG/ML SOLN
80.0000 mL | Freq: Once | INTRAVENOUS | Status: AC | PRN
  Administered 2022-11-16: 80 mL via INTRAVENOUS

## 2022-11-16 MED ORDER — SODIUM CHLORIDE (PF) 0.9 % IJ SOLN
INTRAMUSCULAR | Status: AC
  Filled 2022-11-16: qty 50

## 2022-11-16 NOTE — ED Provider Notes (Signed)
[place] EMERGENCY DEPARTMENT AT [facility] Provider Note   CSN: [id number] Arrival date & time: 11/16/22  1110     History  Chief Complaint  Patient presents with   Chest Pain    William Young is a 62 y.o. male.   Chest Pain Associated symptoms: no abdominal pain, no diaphoresis, no fever and no shortness of breath   Patient is a 62 year old gentleman with past history significant for diabetes, hypertension, 45-pack-year smoking history, presenting with chest pain.  Patient reports pain began 4 days ago and has been constant since then. Pain is exacerbated by exercise and relieved with rest.  Patient describes the pain as pleuritic, somewhat tender to palpation, and radiates to his right shoulder blade. Denies shortness of breath, fever, nausea, vomiting, diaphoresis. Additionally has had a painless macular rash across right forearm for the past several days.      Home Medications Prior to Admission medications   Medication Sig Start Date End Date Taking? Authorizing Provider  acetaminophen-codeine (TYLENOL #3) 300-30 MG tablet Take 1 tablet by mouth daily as needed for moderate pain. 11/10/22   Claiborne Rigg, NP  Blood Glucose Monitoring Suppl (TRUE METRIX METER) w/Device KIT Use as instructed. Check blood glucose level by fingerstick twice per day. E11.65 Patient not taking: Reported on 11/10/2022 05/26/20   Claiborne Rigg, NP  clopidogrel (PLAVIX) 75 MG tablet Take 1 tablet (75 mg total) by mouth daily. 09/08/22   [name], MD  empagliflozin (JARDIANCE) 25 MG TABS tablet Take 1 tablet (25 mg total) by mouth daily before breakfast. 04/22/22   [name] T, PA-C  ezetimibe (ZETIA) 10 MG tablet Take 1 tablet (10 mg total) by mouth daily. 12/01/21   [name], MD  fluticasone furoate-vilanterol (BREO ELLIPTA) 200-25 MCG/ACT AEPB Inhale 1 puff into the lungs daily. 11/10/22   Claiborne Rigg, NP  gabapentin (NEURONTIN) 300 MG capsule Take 1 capsule (300 mg  total) by mouth at bedtime. 08/05/22   Claiborne Rigg, NP  glimepiride (AMARYL) 4 MG tablet Take 2 tablets (8 mg total) by mouth daily with breakfast. 08/05/22   Claiborne Rigg, NP  lidocaine (LIDODERM) 5 % Place 1 patch onto the skin daily. Remove & Discard patch within 12 hours or as directed by MD 04/24/21   Hoy Register, MD  losartan (COZAAR) 50 MG tablet Take 1 tablet (50 mg total) by mouth daily. 04/22/22   [name] T, PA-C  metFORMIN (GLUCOPHAGE) 500 MG tablet Take 1 tablet (500 mg total) by mouth 2 (two) times daily with a meal. 08/05/22   Claiborne Rigg, NP  nitroGLYCERIN (NITROSTAT) 0.4 MG SL tablet Place 1 tablet (0.4 mg total) under the tongue every 5 (five) minutes x 3 doses as needed for chest pain. 09/03/21   [name] T, PA-C  rosuvastatin (CRESTOR) 40 MG tablet Take 1 tablet (40 mg total) by mouth daily. 09/08/22   Claiborne Rigg, NP  tiZANidine (ZANAFLEX) 4 MG tablet Take 1 tablet (4 mg total) by mouth every 8 (eight) hours as needed for muscle spasms. 10/01/22   Hoy Register, MD  Vitamin A 7.5 MG (25000 UT) CAPS Take 50,000 Units by mouth once a week. 08/07/22   [name], MD      Allergies    Aspirin and Ibuprofen    Review of Systems   Review of Systems  Constitutional:  Negative for chills, diaphoresis and fever.  Respiratory:  Positive for chest tightness.  Negative for shortness of breath.   Cardiovascular:  Positive for chest pain.  Gastrointestinal:  Negative for abdominal pain.    Physical Exam Updated Vital Signs BP (!) 178/92 (BP Location: Right Arm)   Pulse 76   Temp 97.7 F (36.5 C) (Oral)   Resp 19   Ht 6\' 4"  (1.93 m)   Wt (!) 143 kg   SpO2 95%   BMI 38.37 kg/m  Physical Exam Cardiovascular:     Rate and Rhythm: Normal rate and regular rhythm.     Heart sounds: Normal heart sounds.  Pulmonary:     Effort: Pulmonary effort is normal.     Breath sounds: Normal breath sounds.  Abdominal:     Palpations: Abdomen is soft.      Tenderness: There is no abdominal tenderness.  Skin:    Comments: Red, nontender macular rash across right and left forearm  Neurological:     General: No focal deficit present.     Mental Status: He is alert and oriented to person, place, and time.     ED Results / Procedures / Treatments   Labs (all labs ordered are listed, but only abnormal results are displayed) Labs Reviewed  BASIC METABOLIC PANEL - Abnormal; Notable for the following components:      Result Value   CO2 18 (*)    Glucose, Bld 140 (*)    All other components within normal limits  CBC - Abnormal; Notable for the following components:   Platelets 143 (*)    All other components within normal limits  D-DIMER, QUANTITATIVE  TROPONIN I (HIGH SENSITIVITY)  TROPONIN I (HIGH SENSITIVITY)    EKG EKG Interpretation Date/Time:  Monday November 16 2022 11:21:57 EDT Ventricular Rate:  77 PR Interval:  183 QRS Duration:  127 QT Interval:  398 QTC Calculation: 451 R Axis:   -65  Text Interpretation: Sinus rhythm Nonspecific IVCD with LAD Consider anterior infarct Confirmed by Alona Bene [phone]) on 11/16/2022 1:35:18 PM  Radiology DG Chest 2 View  Result Date: 11/16/2022 CLINICAL DATA:  Right shoulder pain. EXAM: CHEST - 2 VIEW COMPARISON:  March 30, 2022. FINDINGS: The heart size and mediastinal contours are within normal limits. Both lungs are clear. Old left rib fractures are again noted. IMPRESSION: No active cardiopulmonary disease. Electronically Signed   By: Lupita Raider M.D.   On: 11/16/2022 11:50    Procedures Procedures    Medications Ordered in ED Medications - No data to display  ED Course/ Medical Decision Making/ A&P                             Medical Decision Making Amount and/or Complexity of Data Reviewed Labs: ordered. Radiology: ordered.  Risk Prescription drug management.  Medical Decision Making:   William Young is a 62 y.o. male who presented to the ED today with chest pain  detailed above.    Patient's presentation is complicated by their history of smoking, DM, AAA.  Complete initial physical exam performed. .    Reviewed and confirmed nursing documentation for past medical history, family history, social history.    Initial Assessment:   With the patient's presentation of chest pain, most likely diagnosis is musculoskeletal pain. Other diagnoses were considered including (but not limited to) PE, ACS, stable angina, shingles. These are considered less likely due to history of present illness and physical exam findings.    Initial Plan:   CXR to  evaluate for structural/infectious intrathoracic pathology.  EKG to evaluate for cardiac pathology Serial troponins to evaluate for cardiac pathology D-dimer to evaluate for coagulopathy Objective evaluation as below reviewed   Initial Study Results:   Laboratory  All laboratory results reviewed without evidence of clinically relevant pathology.   Exception: D-dimer: 0.54  EKG EKG was reviewed independently. Rate, rhythm, axis, intervals all examined and without medically relevant abnormality. ST segments without concerns for elevations.    Radiology:  All images reviewed independently. Agree with radiology report at this time.   DG Chest 2 View  Result Date: 11/16/2022 CLINICAL DATA:  Right shoulder pain. EXAM: CHEST - 2 VIEW COMPARISON:  March 30, 2022. FINDINGS: The heart size and mediastinal contours are within normal limits. Both lungs are clear. Old left rib fractures are again noted. IMPRESSION: No active cardiopulmonary disease. Electronically Signed   By: Lupita Raider M.D.   On: 11/16/2022 11:50   DG Knee Complete 4 Views Left  Result Date: 11/14/2022 CLINICAL DATA:  Chronic left knee pain. Anterior knee pain when climbing steps. EXAM: LEFT KNEE - COMPLETE 4+ VIEW COMPARISON:  Left knee radiographs 05/21/2013 FINDINGS: Mild lateral patellar facet and lateral trochlear peripheral degenerative  osteophytes. Unchanged small benign sclerotic bone island within the medial femoral condyle. No acute fracture dislocation. IMPRESSION: Mild patellofemoral osteoarthritis. Electronically Signed   By: Neita Garnet M.D.   On: 11/14/2022 14:18     Reassessment and Plan:   Patient is a 62 year old man with medical history significant for DM, HTN, 45 pack-year history presenting with chest pain. Physical exam was significant for pleuritic chest pain radiating to right shoulder blade. All labs including CBC, CMP, troponins were reassuring, except for D-dimer which was elevated at 0.54. EKG was unremarkable. CXR showed no acute pathology. CT PE study showed no evidence of pulmonary embolism. Differential diagnoses including ACS, PE, aortic dissection among others were considered and deemed less likely due to reassuring labs and imaging. Musculoskeletal etiology of chest pain is most likely at this time. On reassessment, patient was laying comfortably in bed. Patient agreed with plan and was discharged home.          Final Clinical Impression(s) / ED Diagnoses Final diagnoses:  None    Rx / DC Orders ED Discharge Orders     None         Monna Fam, MD 11/16/22 2234    Maia Plan, MD 11/17/22 [phone]

## 2022-11-16 NOTE — Telephone Encounter (Addendum)
Noted is currently in the ED

## 2022-11-16 NOTE — ED Triage Notes (Signed)
Pt said he called his PCP and sent here. Pain to right shoulder blade and under right breast. Since pain started, he began breaking out in blisters on right arm. Pain started when exerting himself at work last week. Denies SOB, n/v.

## 2022-11-16 NOTE — Telephone Encounter (Signed)
  Chief Complaint: R chest pain Symptoms: R chest pain, shoulder pain, rash Frequency: ongoing- comes and goes- but severe when occurring- rash started Saturday Pertinent Negatives: Patient denies dizziness, nausea, vomiting, sweating, fever, difficulty breathing, cough Disposition: [x] ED /[] Urgent Care (no appt availability in office) / [] Appointment(In office/virtual)/ []  [place] [facility]/ [] Home Care/ [] Refused Recommended Disposition /[] [place] [facility]/ []  Follow-up with PCP Additional Notes: Advised per protocol- ED

## 2022-11-16 NOTE — Discharge Instructions (Signed)

## 2022-11-16 NOTE — Telephone Encounter (Signed)
Reason for Disposition  [1] Chest pain (or "angina") comes and goes AND [2] is happening more often (increasing in frequency) or getting worse (increasing in severity)  (Exception: Chest pains that last only a few seconds.)  Answer Assessment - Initial Assessment Questions 1. LOCATION: "Where does it hurt?"       Right lower chest pain, shoulder blade area 2. RADIATION: "Does the pain go anywhere else?" (e.g., into neck, jaw, arms, back)     Into back- R, "blood blister " looking rash on arm 3. ONSET: "When did the chest pain begin?" (Minutes, hours or days)      On/off this week- after appointment- rash started Saturday 4. PATTERN: "Does the pain come and go, or has it been constant since it started?"  "Does it get worse with exertion?"      Comes and goes 5. DURATION: "How long does it last" (e.g., seconds, minutes, hours)     Few hours 6. SEVERITY: "How bad is the pain?"  (e.g., Scale 1-10; mild, moderate, or severe)    - MILD (1-3): doesn't interfere with normal activities     - MODERATE (4-7): interferes with normal activities or awakens from sleep    - SEVERE (8-10): excruciating pain, unable to do any normal activities       Severe when occurs 7. CARDIAC RISK FACTORS: "Do you have any history of heart problems or risk factors for heart disease?" (e.g., angina, prior heart attack; diabetes, high blood pressure, high cholesterol, smoker, or strong family history of heart disease)     Heart disease  8. PULMONARY RISK FACTORS: "Do you have any history of lung disease?"  (e.g., blood clots in lung, asthma, emphysema, birth control pills)     COPD 9. CAUSE: "What do you think is causing the chest pain?"     unsure 10. OTHER SYMPTOMS: "Do you have any other symptoms?" (e.g., dizziness, nausea, vomiting, sweating, fever, difficulty breathing, cough)       No- rash  Protocols used: Chest Pain-A-AH

## 2022-11-17 ENCOUNTER — Other Ambulatory Visit: Payer: Self-pay

## 2022-11-19 ENCOUNTER — Other Ambulatory Visit: Payer: Self-pay

## 2022-11-21 ENCOUNTER — Other Ambulatory Visit: Payer: Self-pay | Admitting: Internal Medicine

## 2022-11-23 ENCOUNTER — Other Ambulatory Visit: Payer: Self-pay

## 2022-11-23 MED ORDER — EZETIMIBE 10 MG PO TABS
10.0000 mg | ORAL_TABLET | Freq: Every day | ORAL | 2 refills | Status: DC
  Filled 2022-11-23: qty 90, 90d supply, fill #0
  Filled 2023-02-26: qty 90, 90d supply, fill #1
  Filled 2023-05-27: qty 90, 90d supply, fill #2

## 2022-11-24 ENCOUNTER — Other Ambulatory Visit (HOSPITAL_COMMUNITY): Payer: Self-pay

## 2022-11-24 ENCOUNTER — Telehealth: Payer: Self-pay

## 2022-11-24 ENCOUNTER — Other Ambulatory Visit: Payer: Self-pay

## 2022-11-24 NOTE — Telephone Encounter (Signed)
Pharmacy Patient Advocate Encounter   Received notification from CoverMyMeds that prior authorization for JARDIANCE is required/requested.   Insurance verification completed.   The patient is insured through [facility] .   Per test claim: PA submitted to [facility] via CoverMyMeds Key/confirmation #/EOC [id number] Status is pending

## 2022-11-24 NOTE — Telephone Encounter (Signed)
Pharmacy Patient Advocate Encounter  Received notification from [facility] that Prior Authorization for JARDIANCE has been APPROVED from 11/24/22 to 11/24/23.William Young

## 2022-11-25 ENCOUNTER — Other Ambulatory Visit: Payer: Self-pay | Admitting: Family Medicine

## 2022-11-25 ENCOUNTER — Other Ambulatory Visit: Payer: Self-pay

## 2022-11-25 DIAGNOSIS — M519 Unspecified thoracic, thoracolumbar and lumbosacral intervertebral disc disorder: Secondary | ICD-10-CM

## 2022-11-25 MED ORDER — TIZANIDINE HCL 4 MG PO TABS
4.0000 mg | ORAL_TABLET | Freq: Three times a day (TID) | ORAL | 0 refills | Status: DC | PRN
Start: 2022-11-25 — End: 2023-02-19
  Filled 2022-11-25: qty 90, 30d supply, fill #0

## 2022-11-25 NOTE — Telephone Encounter (Signed)
Requested medication (s) are due for refill today -yes  Requested medication (s) are on the active medication list -yes  Future visit scheduled -no  Last refill: 10/01/22 #60  Notes to clinic: non delegated Rx  Requested Prescriptions  Pending Prescriptions Disp Refills   tiZANidine (ZANAFLEX) 4 MG tablet 60 tablet 0    Sig: Take 1 tablet (4 mg total) by mouth every 8 (eight) hours as needed for muscle spasms.     Not Delegated - Cardiovascular:  Alpha-2 Agonists - tizanidine Failed - 11/25/2022  2:43 PM      Failed - This refill cannot be delegated      Passed - Valid encounter within last 6 months    Recent Outpatient Visits           2 weeks ago Type 2 diabetes mellitus with hyperglycemia, without long-term current use of insulin [facility])   [place] [facility] [place], [place] W, NP   3 months ago Type 2 diabetes mellitus with hyperglycemia, without long-term current use of insulin [facility])   [place] [facility] [place], [place] W, NP   8 months ago Chronic obstructive pulmonary disease, unspecified COPD type [facility])   [place] [facility] [place], [place] W, NP   8 months ago Primary hypertension   [place] [facility] [place], [place] W, NP   11 months ago Type 2 diabetes mellitus with hyperglycemia, without long-term current use of insulin [facility])   [place] [facility] [place], [place], [place]                 Requested Prescriptions  Pending Prescriptions Disp Refills   tiZANidine (ZANAFLEX) 4 MG tablet 60 tablet 0    Sig: Take 1 tablet (4 mg total) by mouth every 8 (eight) hours as needed for muscle spasms.     Not Delegated - Cardiovascular:  Alpha-2 Agonists - tizanidine Failed - 11/25/2022  2:43 PM      Failed - This refill cannot be delegated      Passed - Valid encounter within last 6 months    Recent Outpatient Visits           2 weeks ago  Type 2 diabetes mellitus with hyperglycemia, without long-term current use of insulin [facility])   [place] [facility] [place], [place] W, NP   3 months ago Type 2 diabetes mellitus with hyperglycemia, without long-term current use of insulin [facility])   [place] [facility] [place], [place] W, NP   8 months ago Chronic obstructive pulmonary disease, unspecified COPD type [facility])   [place] [facility] [place], Shea Stakes, NP   8 months ago Primary hypertension   [place] [facility] [place], [place] W, NP   11 months ago Type 2 diabetes mellitus with hyperglycemia, without long-term current use of insulin [facility])    [facility] [place], [place], [place]

## 2022-11-26 ENCOUNTER — Other Ambulatory Visit: Payer: Self-pay

## 2022-11-30 ENCOUNTER — Other Ambulatory Visit: Payer: Self-pay

## 2022-12-01 ENCOUNTER — Other Ambulatory Visit: Payer: Self-pay

## 2022-12-07 ENCOUNTER — Telehealth: Payer: Self-pay | Admitting: Internal Medicine

## 2022-12-07 ENCOUNTER — Ambulatory Visit: Payer: Medicaid Other

## 2022-12-07 NOTE — Telephone Encounter (Signed)
Pt called in to cancel his lab work today because he already had blood work done and he wanted to make Dr. Tenny Craw and [facility] aware.

## 2022-12-09 ENCOUNTER — Other Ambulatory Visit: Payer: Self-pay

## 2022-12-10 ENCOUNTER — Other Ambulatory Visit: Payer: Self-pay

## 2022-12-14 ENCOUNTER — Other Ambulatory Visit: Payer: Self-pay | Admitting: Nurse Practitioner

## 2022-12-14 DIAGNOSIS — G8929 Other chronic pain: Secondary | ICD-10-CM

## 2022-12-15 ENCOUNTER — Other Ambulatory Visit: Payer: Self-pay

## 2022-12-15 MED ORDER — ACETAMINOPHEN-CODEINE 300-30 MG PO TABS
1.0000 | ORAL_TABLET | Freq: Every day | ORAL | 0 refills | Status: DC | PRN
Start: 2022-12-15 — End: 2023-01-13
  Filled 2022-12-15: qty 30, 30d supply, fill #0

## 2022-12-15 NOTE — Telephone Encounter (Signed)
Requested medication (s) are due for refill today:   Provider to review  Requested medication (s) are on the active medication list:   Yes  Future visit scheduled:   No   Last ordered: 11/10/2022  #30, 0 refills  Non delegated refill    Requested Prescriptions  Pending Prescriptions Disp Refills   acetaminophen-codeine (TYLENOL #3) 300-30 MG tablet 30 tablet 0    Sig: Take 1 tablet by mouth daily as needed for moderate pain.     Not Delegated - Analgesics:  Opioid Agonist Combinations 2 Failed - 12/14/2022  5:52 PM      Failed - This refill cannot be delegated      Failed - Urine Drug Screen completed in last 360 days      Passed - Cr in normal range and within 360 days    Creat  Date Value Ref Range Status  12/27/2015 0.75 0.70 - 1.33 mg/dL Final    Comment:      For patients > or = 62 years of age: The upper reference limit for Creatinine is approximately 13% higher for people identified as African-American.      Creatinine, Ser  Date Value Ref Range Status  11/16/2022 0.93 0.61 - 1.24 mg/dL Final   Creatinine, Urine  Date Value Ref Range Status  11/18/2015 390 (H) 20 - 370 mg/dL Final    Comment:    Result confirmed by automatic dilution. Result repeated and verified.          Passed - eGFR is 10 or above and within 360 days    GFR calc Af Amer  Date Value Ref Range Status  05/24/2020 84 >59 mL/min/1.73 Final    Comment:    **In accordance with recommendations from the NKF-ASN Task force,**   [facility] is in the process of updating its eGFR calculation to the   2021 CKD-EPI creatinine equation that estimates kidney function   without a race variable.    GFR, Estimated  Date Value Ref Range Status  11/16/2022 >60 >60 mL/min Final    Comment:    (NOTE) Calculated using the CKD-EPI Creatinine Equation (2021)    GFR  Date Value Ref Range Status  11/26/2014 102.54 >60.00 mL/min Final   eGFR  Date Value Ref Range Status  11/10/2022 77 >59 mL/min/1.73  Final         Passed - Patient is not pregnant      Passed - Valid encounter within last 3 months    Recent Outpatient Visits           1 month ago Type 2 diabetes mellitus with hyperglycemia, without long-term current use of insulin [facility])   [place] [facility] [place], [place] W, NP   4 months ago Type 2 diabetes mellitus with hyperglycemia, without long-term current use of insulin [facility])   [place] [facility] [place], [place] W, NP   8 months ago Chronic obstructive pulmonary disease, unspecified COPD type [facility])   [place] [facility] [place], Shea Stakes, NP   9 months ago Primary hypertension   [place] [facility] [place], [place] W, NP   1 year ago Type 2 diabetes mellitus with hyperglycemia, without long-term current use of insulin [facility])   [place] [facility] [place], [place], [place]

## 2022-12-16 ENCOUNTER — Other Ambulatory Visit: Payer: Self-pay

## 2022-12-18 ENCOUNTER — Other Ambulatory Visit: Payer: Self-pay

## 2022-12-21 ENCOUNTER — Other Ambulatory Visit: Payer: Self-pay

## 2023-01-06 ENCOUNTER — Telehealth: Payer: Self-pay

## 2023-01-06 NOTE — Telephone Encounter (Signed)
Copied from CRM [id number]. Topic: Referral - Request for Referral >> Jan 05, 2023  8:50 AM Marlow Baars wrote: Has patient seen PCP for this complaint? Yes.   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 He has tried to copper sleeve and it only helps minimally. Please assist patient further as it is really bothering him

## 2023-01-07 ENCOUNTER — Other Ambulatory Visit: Payer: Self-pay

## 2023-01-08 ENCOUNTER — Other Ambulatory Visit: Payer: Self-pay | Admitting: Nurse Practitioner

## 2023-01-08 DIAGNOSIS — M1712 Unilateral primary osteoarthritis, left knee: Secondary | ICD-10-CM

## 2023-01-10 ENCOUNTER — Other Ambulatory Visit: Payer: Self-pay | Admitting: Nurse Practitioner

## 2023-01-10 DIAGNOSIS — E785 Hyperlipidemia, unspecified: Secondary | ICD-10-CM

## 2023-01-12 MED ORDER — ROSUVASTATIN CALCIUM 40 MG PO TABS
40.0000 mg | ORAL_TABLET | Freq: Every day | ORAL | 0 refills | Status: DC
Start: 2023-01-12 — End: 2023-04-13
  Filled 2023-01-12: qty 90, 90d supply, fill #0

## 2023-01-13 ENCOUNTER — Other Ambulatory Visit: Payer: Self-pay

## 2023-01-13 ENCOUNTER — Other Ambulatory Visit: Payer: Self-pay | Admitting: Nurse Practitioner

## 2023-01-13 DIAGNOSIS — G8929 Other chronic pain: Secondary | ICD-10-CM

## 2023-01-15 ENCOUNTER — Other Ambulatory Visit: Payer: Self-pay

## 2023-01-15 MED ORDER — ACETAMINOPHEN-CODEINE 300-30 MG PO TABS
1.0000 | ORAL_TABLET | Freq: Every day | ORAL | 0 refills | Status: DC | PRN
Start: 2023-01-15 — End: 2023-02-16
  Filled 2023-01-15: qty 30, 30d supply, fill #0

## 2023-01-20 ENCOUNTER — Emergency Department (HOSPITAL_COMMUNITY)
Admission: EM | Admit: 2023-01-20 | Discharge: 2023-01-20 | Disposition: A | Discharge status: Home / Self Care | Payer: Medicaid Other | Attending: Emergency Medicine | Admitting: Emergency Medicine

## 2023-01-20 ENCOUNTER — Emergency Department (HOSPITAL_COMMUNITY): Payer: Medicaid Other

## 2023-01-20 ENCOUNTER — Encounter (HOSPITAL_COMMUNITY): Payer: Self-pay

## 2023-01-20 ENCOUNTER — Other Ambulatory Visit: Payer: Self-pay

## 2023-01-20 DIAGNOSIS — E119 Type 2 diabetes mellitus without complications: Secondary | ICD-10-CM | POA: Insufficient documentation

## 2023-01-20 DIAGNOSIS — K921 Melena: Secondary | ICD-10-CM | POA: Insufficient documentation

## 2023-01-20 DIAGNOSIS — I714 Abdominal aortic aneurysm, without rupture, unspecified: Secondary | ICD-10-CM | POA: Insufficient documentation

## 2023-01-20 DIAGNOSIS — Z7984 Long term (current) use of oral hypoglycemic drugs: Secondary | ICD-10-CM | POA: Diagnosis not present

## 2023-01-20 DIAGNOSIS — D7389 Other diseases of spleen: Secondary | ICD-10-CM | POA: Diagnosis not present

## 2023-01-20 DIAGNOSIS — Z7902 Long term (current) use of antithrombotics/antiplatelets: Secondary | ICD-10-CM | POA: Insufficient documentation

## 2023-01-20 DIAGNOSIS — K5790 Diverticulosis of intestine, part unspecified, without perforation or abscess without bleeding: Secondary | ICD-10-CM | POA: Insufficient documentation

## 2023-01-20 DIAGNOSIS — I2510 Atherosclerotic heart disease of native coronary artery without angina pectoris: Secondary | ICD-10-CM | POA: Diagnosis not present

## 2023-01-20 DIAGNOSIS — I10 Essential (primary) hypertension: Secondary | ICD-10-CM | POA: Diagnosis not present

## 2023-01-20 DIAGNOSIS — R109 Unspecified abdominal pain: Secondary | ICD-10-CM | POA: Diagnosis present

## 2023-01-20 LAB — COMPREHENSIVE METABOLIC PANEL
ALT: 31 U/L (ref 0–44)
AST: 26 U/L (ref 15–41)
Albumin: 3.9 g/dL (ref 3.5–5.0)
Alkaline Phosphatase: 71 U/L (ref 38–126)
Anion gap: 5 (ref 5–15)
BUN: 20 mg/dL (ref 8–23)
CO2: 21 mmol/L — ABNORMAL LOW (ref 22–32)
Calcium: 8.9 mg/dL (ref 8.9–10.3)
Chloride: 109 mmol/L (ref 98–111)
Creatinine, Ser: 1.13 mg/dL (ref 0.61–1.24)
GFR, Estimated: >60 mL/min (ref >60)
Glucose, Bld: 136 mg/dL — ABNORMAL HIGH (ref 70–99)
Potassium: 4.5 mmol/L (ref 3.5–5.1)
Sodium: 135 mmol/L (ref 135–145)
Total Bilirubin: 0.7 mg/dL (ref 0.3–1.2)
Total Protein: 7.3 g/dL (ref 6.5–8.1)

## 2023-01-20 LAB — CBC
HCT: 44.5 % (ref 39.0–52.0)
Hemoglobin: 14.5 g/dL (ref 13.0–17.0)
MCH: 32.6 pg (ref 26.0–34.0)
MCHC: 32.6 g/dL (ref 30.0–36.0)
MCV: 100 fL (ref 80.0–100.0)
Platelets: 144 10*3/uL — ABNORMAL LOW (ref 150–400)
RBC: 4.45 MIL/uL (ref 4.22–5.81)
RDW: 14.1 % (ref 11.5–15.5)
WBC: 5.3 10*3/uL (ref 4.0–10.5)
nRBC: 0 % (ref 0.0–0.2)

## 2023-01-20 LAB — LIPASE, BLOOD: Lipase: 29 U/L (ref 11–51)

## 2023-01-20 LAB — POC OCCULT BLOOD, ED: Fecal Occult Bld: NEGATIVE

## 2023-01-20 LAB — TYPE AND SCREEN
ABO/RH(D): O POS
Antibody Screen: NEGATIVE

## 2023-01-20 MED ORDER — ONDANSETRON HCL 4 MG/2ML IJ SOLN
4.0000 mg | Freq: Once | INTRAMUSCULAR | Status: AC
  Administered 2023-01-20: 4 mg via INTRAVENOUS
  Filled 2023-01-20: qty 2

## 2023-01-20 MED ORDER — SODIUM CHLORIDE (PF) 0.9 % IJ SOLN
INTRAMUSCULAR | Status: AC
  Filled 2023-01-20: qty 50

## 2023-01-20 MED ORDER — MORPHINE SULFATE (PF) 4 MG/ML IV SOLN
4.0000 mg | Freq: Once | INTRAVENOUS | Status: AC
  Administered 2023-01-20: 4 mg via INTRAVENOUS
  Filled 2023-01-20: qty 1

## 2023-01-20 MED ORDER — SODIUM CHLORIDE 0.9 % IV BOLUS (SEPSIS)
1000.0000 mL | Freq: Once | INTRAVENOUS | Status: AC
  Administered 2023-01-20: 1000 mL via INTRAVENOUS

## 2023-01-20 MED ORDER — IOHEXOL 300 MG/ML  SOLN
100.0000 mL | Freq: Once | INTRAMUSCULAR | Status: AC | PRN
  Administered 2023-01-20: 100 mL via INTRAVENOUS

## 2023-01-20 NOTE — ED Provider Notes (Signed)
[place] EMERGENCY DEPARTMENT AT [facility] Provider Note   CSN: [id number] Arrival date & time: 01/20/23  1016     History  Chief Complaint  Patient presents with   Abdominal Pain   Melena         William Young is a 62 y.o. male.   Abdominal Pain    Patient has a history of reflux hyperglycemia, coronary artery disease, hypertension diabetes non-STEMI, aortic aneurysm.  Patient states he has been having issues with abdominal pain and intermittent rectal bleeding ongoing for a couple of months now.  Patient states he has cramps in his abdomen and blood in his stool every couple of days.  The pain in his abdomen tends to correlate when he has blood in his stool.  Patient has not seen anyone for this.  Family states it took them a while to convince him to get this evaluated.  Patient is taking Plavix because of his heart disease.  Patient has not had a screening colonoscopy.  Home Medications Prior to Admission medications   Medication Sig Start Date End Date Taking? Authorizing Provider  acetaminophen-codeine (TYLENOL #3) 300-30 MG tablet Take 1 tablet by mouth daily as needed for moderate pain. 01/15/23   Claiborne Rigg, NP  Blood Glucose Monitoring Suppl (TRUE METRIX METER) w/Device KIT Use as instructed. Check blood glucose level by fingerstick twice per day. E11.65 Patient not taking: Reported on 11/10/2022 05/26/20   Claiborne Rigg, NP  clopidogrel (PLAVIX) 75 MG tablet Take 1 tablet (75 mg total) by mouth daily. 09/08/22   [name], MD  empagliflozin (JARDIANCE) 25 MG TABS tablet Take 1 tablet (25 mg total) by mouth daily before breakfast. 04/22/22   [name] T, PA-C  ezetimibe (ZETIA) 10 MG tablet Take 1 tablet (10 mg total) by mouth daily. 11/23/22   [name], MD  fluticasone furoate-vilanterol (BREO ELLIPTA) 200-25 MCG/ACT AEPB Inhale 1 puff into the lungs daily. 11/10/22   Claiborne Rigg, NP  gabapentin (NEURONTIN) 300 MG capsule Take 1 capsule  (300 mg total) by mouth at bedtime. 08/05/22   Claiborne Rigg, NP  glimepiride (AMARYL) 4 MG tablet Take 2 tablets (8 mg total) by mouth daily with breakfast. 08/05/22   Claiborne Rigg, NP  lidocaine (LIDODERM) 5 % Place 1 patch onto the skin daily. Remove & Discard patch within 12 hours or as directed by MD 04/24/21   Hoy Register, MD  losartan (COZAAR) 50 MG tablet Take 1 tablet (50 mg total) by mouth daily. 04/22/22   [name] T, PA-C  metFORMIN (GLUCOPHAGE) 500 MG tablet Take 1 tablet (500 mg total) by mouth 2 (two) times daily with a meal. 08/05/22   Claiborne Rigg, NP  nitroGLYCERIN (NITROSTAT) 0.4 MG SL tablet Place 1 tablet (0.4 mg total) under the tongue every 5 (five) minutes x 3 doses as needed for chest pain. 09/03/21   [name] T, PA-C  rosuvastatin (CRESTOR) 40 MG tablet Take 1 tablet (40 mg total) by mouth daily. 01/12/23   Hoy Register, MD  tiZANidine (ZANAFLEX) 4 MG tablet Take 1 tablet (4 mg total) by mouth every 8 (eight) hours as needed for muscle spasms. 11/25/22   Hoy Register, MD  Vitamin A 7.5 MG (25000 UT) CAPS Take 50,000 Units by mouth once a week. 08/07/22   [name], MD      Allergies    Aspirin and Ibuprofen    Review of Systems  Review of Systems  Gastrointestinal:  Positive for abdominal pain.    Physical Exam Updated Vital Signs BP (!) 141/79   Pulse 61   Temp 98.4 F (36.9 C) (Oral)   Resp 18   Ht 1.905 m (6\' 3" )   Wt (!) 141.1 kg   SpO2 96%   BMI 38.87 kg/m  Physical Exam Vitals and nursing note reviewed.  Constitutional:      General: He is not in acute distress.    Appearance: He is well-developed.  HENT:     Head: Normocephalic and atraumatic.     Right Ear: External ear normal.     Left Ear: External ear normal.  Eyes:     General: No scleral icterus.       Right eye: No discharge.        Left eye: No discharge.     Conjunctiva/sclera: Conjunctivae normal.  Neck:     Trachea: No tracheal deviation.   Cardiovascular:     Rate and Rhythm: Normal rate and regular rhythm.  Pulmonary:     Effort: Pulmonary effort is normal. No respiratory distress.     Breath sounds: Normal breath sounds. No stridor. No wheezing or rales.  Abdominal:     General: Bowel sounds are normal. There is no distension.     Palpations: Abdomen is soft.     Tenderness: There is abdominal tenderness. There is no guarding or rebound.  Genitourinary:    Comments: Discomfort during digital rectal exam, no mass appreciated, no melena Musculoskeletal:        General: No tenderness or deformity.     Cervical back: Neck supple.  Skin:    General: Skin is warm and dry.     Findings: No rash.  Neurological:     General: No focal deficit present.     Mental Status: He is alert.     Cranial Nerves: No cranial nerve deficit, dysarthria or facial asymmetry.     Sensory: No sensory deficit.     Motor: No abnormal muscle tone or seizure activity.     Coordination: Coordination normal.  Psychiatric:        Mood and Affect: Mood normal.     ED Results / Procedures / Treatments   Labs (all labs ordered are listed, but only abnormal results are displayed) Labs Reviewed  COMPREHENSIVE METABOLIC PANEL - Abnormal; Notable for the following components:      Result Value   CO2 21 (*)    Glucose, Bld 136 (*)    All other components within normal limits  CBC - Abnormal; Notable for the following components:   Platelets 144 (*)    All other components within normal limits  LIPASE, BLOOD  POC OCCULT BLOOD, ED  TYPE AND SCREEN    EKG None  Radiology CT ABDOMEN PELVIS W CONTRAST  Result Date: 01/20/2023 CLINICAL DATA:  Abdominal pain, blood in stool. Patient is on blood thinners and does have an aneurysm. EXAM: CT ABDOMEN AND PELVIS WITH CONTRAST TECHNIQUE: Multidetector CT imaging of the abdomen and pelvis was performed using the standard protocol following bolus administration of intravenous contrast. RADIATION DOSE  REDUCTION: This exam was performed according to the departmental dose-optimization program which includes automated exposure control, adjustment of the mA and/or kV according to patient size and/or use of iterative reconstruction technique. CONTRAST:  OMNIPAQUE IOHEXOL 300 MG/ML  SOLN COMPARISON:  CT abdomen and pelvis 2022 September FINDINGS: Lower chest: There is some linear opacity lung bases  likely scar or atelectasis. No pleural effusion. Stable 3 mm subpleural nodule, juxtapleural on series 5, image 5. No additional imaging follow-up. Coronary artery calcifications are seen. Hepatobiliary: Fatty liver infiltration. Gallbladder is present. Patent portal vein. Pancreas: Unremarkable. No pancreatic ductal dilatation or surrounding inflammatory changes. Spleen: Spleen is nonenlarged but there are some small low-attenuation foci. Example measures 15 mm on series 2, image 22. These indeterminate lesions. Recommend further evaluation correlation any prior. The prior study is a CT angiogram with limited enhancement of the parenchyma of the spleen. Adrenals/Urinary Tract: Adrenal glands are preserved. Bosniak 1 bilateral renal cysts are identified. No imaging follow-up. Largest is seen exophytic from the anterior aspect of the right kidney measuring 3.8 cm. Punctate nonobstructing renal stones bilaterally. The ureters have normal course and caliber down to the bladder. Preserved contours of the urinary bladder. Stomach/Bowel: On this non oral contrast exam, the stomach is nondilated. Proximal duodenal diverticulum. There are some mildly distended fluid-filled loops of jejunum in the left upper quadrant with slight fold thickening, nonspecific. The ileum is nondilated. Normal appendix in the right lower quadrant. The large bowel has moderate colonic stool and is nondilated. There is some left-sided colonic diverticula particularly towards the distal descending and sigmoid region no distention, wall thickening or  inflammatory changes identified. There is some evidence of circular muscle hypertrophy. Vascular/Lymphatic: Previously there was a 3.7 cm infrarenal abdominal aortic aneurysm. Today the aneurysm measures 4.4 by 4.2 cm. There is moderate mural plaque and thrombus. Scattered vascular calcifications. Normal caliber IVC. No specific abnormal lymph node enlargement identified in the abdomen and pelvis. Reproductive: Prostate is unremarkable. Other: No free air or free fluid. Small bilateral fat containing inguinal hernias. Musculoskeletal: Scattered degenerative changes along the spine and pelvis. IMPRESSION: Sigmoid and descending colon diverticulosis with circular muscle hypertrophy. No inflammatory changes at this time. Several mildly distended fluid-filled loops of jejunum with slight fold thickening, nonspecific. Please correlate with symptoms. Increasing size of the fusiform infrarenal abdominal aortic aneurysm now measuring up to 4.4 cm. Recommend follow-up every 12 months and vascular consultation. This recommendation follows ACR consensus guidelines: White Paper of the ACR Incidental Findings Committee II on Vascular Findings. J Am Coll Radiol 2013; 10:789-794. Nonobstructing bilateral renal stones. Splenic lesions identified which are indeterminate. The spleen itself is nonenlarged and there is no ascites. Recommend correlation to more recent study with portal venous phase contrast imaging to assess stability otherwise an MRI may be of some benefit to further delineate these lesions. Electronically Signed   By: Karen Kays M.D.   On: 01/20/2023 12:51    Procedures Procedures    Medications Ordered in ED Medications  morphine (PF) 4 MG/ML injection 4 mg (4 mg Intravenous Given 01/20/23 1126)  ondansetron (ZOFRAN) injection 4 mg (4 mg Intravenous Given 01/20/23 1126)  sodium chloride 0.9 % bolus 1,000 mL (1,000 mLs Intravenous New Bag/Given 01/20/23 1126)  iohexol (OMNIPAQUE) 300 MG/ML solution 100 mL  (100 mLs Intravenous Contrast Given 01/20/23 1151)    ED Course/ Medical Decision Making/ A&P Clinical Course as of 01/20/23 1357  Wed Jan 20, 2023  1142 CBC normal.  Fecal occult negative.  Metabolic panel normal. [JK]  1322 CT scan shows diverticulosis but no signs of diverticulitis.  Patient has mildly distended loops of jejunum but this is nonspecific in nature.  Slight increasing size of his of his abdominal aortic aneurysm.  Patient is aware of this and is following up with vascular surgery.  Incidental indeterminate splenic lesions are also  noted [JK]    Clinical Course User Index [JK] Linwood Dibbles, MD                                 Medical Decision Making Differential diagnosis includes but not limited to diverticulitis colitis, GI bleeding complications from his aneurysm  Problems Addressed: Abdominal aneurysm [facility]): chronic illness or injury Diverticulosis: undiagnosed new problem with uncertain prognosis Splenic lesion: undiagnosed new problem with uncertain prognosis  Amount and/or Complexity of Data Reviewed Labs: ordered. Radiology: ordered.  Risk Prescription drug management.   Patient presented with complaints of intermittent abdominal cramping and blood in stool ongoing off and on for last couple months.  Patient has not had an outpatient screening colonoscopy.  In the ED there was no blood noted on his fecal occult.  He had some mild tenderness to palpation but no severe pain rebound or guarding.  CT scan shows no clear etiology for his symptoms.  Nonspecific jejunal findings noted but no signs of colitis diverticulitis.  Patient's aneurysm was noted on the CT scan.  He does follow-up with vascular surgery for that.  Splenic lesions noted and will have him follow-up with his PCP to arrange for further outpatient testing.  Patient appears appropriate for discharge.  I do think he would benefit from outpatient follow-up with GI possible colonoscopy.  Evaluation and  diagnostic testing in the emergency department does not suggest an emergent condition requiring admission or immediate intervention beyond what has been performed at this time.  The patient is safe for discharge and has been instructed to return immediately for worsening symptoms, change in symptoms or any other concerns.        Final Clinical Impression(s) / ED Diagnoses Final diagnoses:  Splenic lesion  Diverticulosis  Abdominal aneurysm [facility])    Rx / DC Orders ED Discharge Orders     None         Linwood Dibbles, MD 01/20/23 1357

## 2023-01-20 NOTE — ED Triage Notes (Addendum)
Pt complaining of abd pain, blood in stool, and bruising on stomach. Pt does take blood thinners, and does have hx of aneurysm. Pt does take blood thinners.

## 2023-01-20 NOTE — Discharge Instructions (Addendum)
Follow-up with a GI doctor as we discussed for further evaluation of the blood in the stool and the abdominal pain you have been having.  Follow-up with your vascular doctor for continued evaluation of your aneurysm.  Follow-up with your primary care doctor to arrange for routine imaging test to evaluate the incidental spleen lesions noted today on the CT scan.  Return to the ED as needed for fevers chills or other concerning symptoms

## 2023-01-28 ENCOUNTER — Other Ambulatory Visit: Payer: Self-pay

## 2023-01-28 ENCOUNTER — Ambulatory Visit: Payer: Medicaid Other | Attending: Nurse Practitioner | Admitting: Nurse Practitioner

## 2023-01-28 ENCOUNTER — Encounter: Payer: Self-pay | Admitting: Nurse Practitioner

## 2023-01-28 VITALS — BP 132/83 | HR 78 | Ht 75.0 in | Wt 312.4 lb

## 2023-01-28 DIAGNOSIS — Z1211 Encounter for screening for malignant neoplasm of colon: Secondary | ICD-10-CM

## 2023-01-28 DIAGNOSIS — D7389 Other diseases of spleen: Secondary | ICD-10-CM | POA: Diagnosis not present

## 2023-01-28 DIAGNOSIS — R195 Other fecal abnormalities: Secondary | ICD-10-CM | POA: Diagnosis not present

## 2023-01-28 DIAGNOSIS — Z23 Encounter for immunization: Secondary | ICD-10-CM

## 2023-01-28 NOTE — Progress Notes (Signed)
Assessment & Plan:  There are no diagnoses linked to this encounter.  Patient has been counseled on age-appropriate routine health concerns for screening and prevention. These are reviewed and up-to-date. Referrals have been placed accordingly. Immunizations are up-to-date or declined.    Subjective:   Chief Complaint  Patient presents with   ED visit   HPI William Young 62 y.o. male presents to office today HFU   He has a past medical history of Abdominal aortic aneurysm, Aortic atherosclerosis, CAD (09/2014), GERD, History of echocardiogram, HLD, HTN, Hx of NSTEMI 5/16 tx with BMS to [facility] and DES to RPLB3 (09/10/2014), Hyperglycemia, OSA, Pulmonary nodules, COPD, PAD, Tobacco use, Type II diabetes mellitus, and Varicose veins of both lower extremities.   Followed by Cardiology and Pulmonology   He is requesting GI referral today. Has upcoming appointment for Nephrologist for evaluation of right renal cyst.   HFU 01-20-2023 Per hospital note.  Patient presented with complaints of intermittent abdominal cramping and blood in stool ongoing off and on for last couple months.  Patient has not had an outpatient screening colonoscopy.  In the ED there was no blood noted on his fecal occult.  He had some mild tenderness to palpation but no severe pain rebound or guarding.  CT scan shows no clear etiology for his symptoms.  Nonspecific jejunal findings noted but no signs of colitis diverticulitis.  Patient's aneurysm was noted on the CT scan.  He does follow-up with vascular surgery for that.  Splenic lesions noted and will have him follow-up with his PCP to arrange for further outpatient testing.   To note today: patient had previously declined colonoscopy in the past (07-05-2020)  Will place referral today. He denies any recent blood in stool since discharging from hospital. Abdominal pain continues intermittent.    Review of Systems  Constitutional:  Negative for fever, malaise/fatigue and  weight loss.  HENT: Negative.  Negative for nosebleeds.   Eyes: Negative.  Negative for blurred vision, double vision and photophobia.  Respiratory: Negative.  Negative for cough and shortness of breath.   Cardiovascular: Negative.  Negative for chest pain, palpitations and leg swelling.  Gastrointestinal:  Positive for abdominal pain. Negative for blood in stool, constipation, diarrhea, heartburn, melena, nausea and vomiting.  Musculoskeletal: Negative.  Negative for myalgias.  Neurological: Negative.  Negative for dizziness, focal weakness, seizures and headaches.  Psychiatric/Behavioral: Negative.  Negative for suicidal ideas.     Past Medical History:  Diagnosis Date   Abdominal aortic aneurysm (AAA) (HCC)    CT 1/22: infrarenal 3.8 cm - repeat US in 2 years // AAA [place] 2/22: AAA 3.8 cm; aorto iliac atherosclerosis without hemodynamically significant stenosis   Aortic atherosclerosis (HCC)    CT in 03/2020   CAD (coronary artery disease) 09/2014   a. NSTEMI >> LHC 5/16:  mLAD 15, pRCA 90, mRCA 40, RPLB3 80, EF normal with inf HK >> PCI:  BMS to pRCA and DES to RPLB3 // Cath 11/21: Patent RCA stent, patent RPL stent; medical therapy    GERD (gastroesophageal reflux disease)    History of echocardiogram    Echo 6/16:  EF 50-55%, no RWMA, Gr 1 DD   HLD (hyperlipidemia)    Hattie Perch 03/15/2017   HTN (hypertension)    Hx of NSTEMI 5/16 tx with BMS to pRCA and DES to RPLB3 09/10/2014   Hyperglycemia    OSA (obstructive sleep apnea)    "dx'd; couldn't tolerate mask" (03/15/2017)   PAD (peripheral artery disease) (  HCC)    AAA [place] in 06/2020: aorto iliac atherosclerosis   Tobacco use    Type II diabetes mellitus (HCC)    Varicose veins of both lower extremities    S/P ablation 02/2017    Past Surgical History:  Procedure Laterality Date   CARDIAC CATHETERIZATION N/A 09/11/2014   Procedure: Left Heart Cath and Coronary Angiography;  Surgeon: Marykay Lex, MD;  Location: [facility] INVASIVE CV LAB  CUPID;  Service: Cardiovascular;  Laterality: N/A;   CARDIAC CATHETERIZATION  09/11/2014   Procedure: Coronary Stent Intervention;  Surgeon: Marykay Lex, MD;  Location: [facility] INVASIVE CV LAB CUPID;  Service: Cardiovascular;;   ENDOVENOUS ABLATION SAPHENOUS VEIN W/ LASER Right 02/22/2017   endovenous laser ablation R GSV and stab phlebectomy >20 incisions R leg by Josephina Gip MD    LEFT HEART CATH AND CORONARY ANGIOGRAPHY N/A 04/01/2020   Procedure: LEFT HEART CATH AND CORONARY ANGIOGRAPHY;  Surgeon: Runell Gess, MD;  Location: [facility] INVASIVE CV LAB;  Service: Cardiovascular;  Laterality: N/A;    Family History  Problem Relation Age of Onset   Heart attack Maternal Uncle    Heart attack Paternal Grandmother    Cancer Maternal Grandfather    Heart attack Maternal Grandmother    Stroke Neg Hx    Hypertension Neg Hx     Social History Reviewed with no changes to be made today.   Outpatient Medications Prior to Visit  Medication Sig Dispense Refill   acetaminophen-codeine (TYLENOL #3) 300-30 MG tablet Take 1 tablet by mouth daily as needed for moderate pain. 30 tablet 0   clopidogrel (PLAVIX) 75 MG tablet Take 1 tablet (75 mg total) by mouth daily. 90 tablet 3   empagliflozin (JARDIANCE) 25 MG TABS tablet Take 1 tablet (25 mg total) by mouth daily before breakfast. 90 tablet 3   ezetimibe (ZETIA) 10 MG tablet Take 1 tablet (10 mg total) by mouth daily. 90 tablet 2   fluticasone furoate-vilanterol (BREO ELLIPTA) 200-25 MCG/ACT AEPB Inhale 1 puff into the lungs daily. 60 each 11   gabapentin (NEURONTIN) 300 MG capsule Take 1 capsule (300 mg total) by mouth at bedtime. 90 capsule 1   glimepiride (AMARYL) 4 MG tablet Take 2 tablets (8 mg total) by mouth daily with breakfast. 90 tablet 3   lidocaine (LIDODERM) 5 % Place 1 patch onto the skin daily. Remove & Discard patch within 12 hours or as directed by MD 30 patch 0   losartan (COZAAR) 50 MG tablet Take 1 tablet (50 mg total) by mouth  daily. 90 tablet 3   metFORMIN (GLUCOPHAGE) 500 MG tablet Take 1 tablet (500 mg total) by mouth 2 (two) times daily with a meal. 180 tablet 1   nitroGLYCERIN (NITROSTAT) 0.4 MG SL tablet Place 1 tablet (0.4 mg total) under the tongue every 5 (five) minutes x 3 doses as needed for chest pain. 25 tablet 12   rosuvastatin (CRESTOR) 40 MG tablet Take 1 tablet (40 mg total) by mouth daily. 90 tablet 0   tiZANidine (ZANAFLEX) 4 MG tablet Take 1 tablet (4 mg total) by mouth every 8 (eight) hours as needed for muscle spasms. 90 tablet 0   Blood Glucose Monitoring Suppl (TRUE METRIX METER) w/Device KIT Use as instructed. Check blood glucose level by fingerstick twice per day. E11.65 (Patient not taking: Reported on 11/10/2022) 1 kit 0   Vitamin A 7.5 MG (25000 UT) CAPS Take 50,000 Units by mouth once a week. (Patient not taking: Reported on  01/28/2023) 30 capsule    No facility-administered medications prior to visit.    Allergies  Allergen Reactions   Aspirin Anaphylaxis, Swelling and Rash    Throat closes    Ibuprofen Anaphylaxis, Swelling and Rash       Objective:    BP 132/83 (BP Location: Left Arm, Patient Position: Sitting, Cuff Size: Normal)   Pulse 78   Ht 6\' 3"  (1.905 m)   Wt (!) 312 lb 6.4 oz (141.7 kg)   SpO2 96%   BMI 39.05 kg/m  Wt Readings from Last 3 Encounters:  01/28/23 (!) 312 lb 6.4 oz (141.7 kg)  01/20/23 (!) 311 lb (141.1 kg)  11/16/22 (!) 315 lb 4.1 oz (143 kg)    Physical Exam Vitals and nursing note reviewed.  Constitutional:      Appearance: He is well-developed.  HENT:     Head: Normocephalic and atraumatic.  Cardiovascular:     Rate and Rhythm: Normal rate and regular rhythm.     Heart sounds: Normal heart sounds. No murmur heard.    No friction rub. No gallop.  Pulmonary:     Effort: Pulmonary effort is normal. No tachypnea or respiratory distress.     Breath sounds: Normal breath sounds. No decreased breath sounds, wheezing, rhonchi or rales.  Chest:      Chest wall: No tenderness.  Abdominal:     General: Bowel sounds are normal.     Palpations: Abdomen is soft.  Musculoskeletal:        General: Normal range of motion.     Cervical back: Normal range of motion.  Skin:    General: Skin is warm and dry.  Neurological:     Mental Status: He is alert and oriented to person, place, and time.     Coordination: Coordination normal.  Psychiatric:        Behavior: Behavior normal. Behavior is cooperative.        Thought Content: Thought content normal.        Judgment: Judgment normal.          Patient has been counseled extensively about nutrition and exercise as well as the importance of adherence with medications and regular follow-up. The patient was given clear instructions to go to ER or return to medical center if symptoms don't improve, worsen or new problems develop. The patient verbalized understanding.   Follow-up: No follow-ups on file.   Claiborne Rigg, FNP-BC [facility] [place], [place] [phone]   01/28/2023, 1:45 PM

## 2023-01-29 ENCOUNTER — Ambulatory Visit (INDEPENDENT_AMBULATORY_CARE_PROVIDER_SITE_OTHER): Payer: Medicaid Other | Admitting: Orthopaedic Surgery

## 2023-01-29 VITALS — BP 170/111 | HR 80

## 2023-01-29 DIAGNOSIS — M5442 Lumbago with sciatica, left side: Secondary | ICD-10-CM

## 2023-01-29 DIAGNOSIS — M5441 Lumbago with sciatica, right side: Secondary | ICD-10-CM

## 2023-01-29 DIAGNOSIS — M94262 Chondromalacia, left knee: Secondary | ICD-10-CM | POA: Diagnosis not present

## 2023-01-29 DIAGNOSIS — G8929 Other chronic pain: Secondary | ICD-10-CM | POA: Diagnosis not present

## 2023-01-29 NOTE — Progress Notes (Unsigned)
Office Visit Note   Patient: William Young           Date of Birth: 08/18/1960           MRN: [id number] Visit Date: 01/29/2023              Requested by: Claiborne Rigg, NP [id number] [place],  [place] [address] PCP: Claiborne Rigg, NP   Assessment & Plan: Visit Diagnoses: No diagnosis found.  Plan: ***  Follow-Up Instructions: No follow-ups on file.   Orders:  No orders of the defined types were placed in this encounter.  No orders of the defined types were placed in this encounter.     Procedures: No procedures performed   Clinical Data: No additional findings.   Subjective: Chief Complaint  Patient presents with   Left Knee - Pain    HPI   Review of Systems   Objective: Vital Signs: BP (!) 170/111   Pulse 80   Physical Exam  Ortho Exam  Specialty Comments:  No specialty comments available.  Imaging: No results found.   PMFS History: Patient Active Problem List   Diagnosis Date Noted   AKI (acute kidney injury) (HCC) 04/22/2022   Type 2 diabetes mellitus with complication, without long-term current use of insulin (HCC) 04/22/2022   Hypotension 03/30/2022   COPD (chronic obstructive pulmonary disease) (HCC) 11/27/2021   SOB (shortness of breath) 09/03/2021   Pulmonary nodules 07/12/2020   Peripheral arterial disease (HCC)    Chronic diastolic heart failure (HCC) 07/06/2020   Aortic atherosclerosis (HCC) 05/26/2020   AAA (abdominal aortic aneurysm) without rupture (HCC) 05/26/2020   Thrombocytopenia (HCC) 08/13/2017   Dizziness    Precordial pain    History of coronary artery disease    Varicose veins of right lower extremity with complications 10/27/2016   Hyperlipidemia LDL goal <70 09/28/2014   Obstructive sleep apnea 09/14/2014   Morbid obesity (HCC) 09/12/2014   Coronary artery disease involving native coronary artery with angina pectoris (HCC)    Hx of NSTEMI 5/16 tx with BMS to pRCA and DES to RPLB3  09/10/2014   Essential hypertension 09/10/2014   Tobacco use    GERD (gastroesophageal reflux disease)    Past Medical History:  Diagnosis Date   Abdominal aortic aneurysm (AAA) (HCC)    CT 1/22: infrarenal 3.8 cm - repeat US in 2 years // AAA [place] 2/22: AAA 3.8 cm; aorto iliac atherosclerosis without hemodynamically significant stenosis   Aortic atherosclerosis (HCC)    CT in 03/2020   CAD (coronary artery disease) 09/2014   a. NSTEMI >> LHC 5/16:  mLAD 15, pRCA 90, mRCA 40, RPLB3 80, EF normal with inf HK >> PCI:  BMS to pRCA and DES to RPLB3 // Cath 11/21: Patent RCA stent, patent RPL stent; medical therapy    GERD (gastroesophageal reflux disease)    History of echocardiogram    Echo 6/16:  EF 50-55%, no RWMA, Gr 1 DD   HLD (hyperlipidemia)    Hattie Perch 03/15/2017   HTN (hypertension)    Hx of NSTEMI 5/16 tx with BMS to pRCA and DES to RPLB3 09/10/2014   Hyperglycemia    OSA (obstructive sleep apnea)    "dx'd; couldn't tolerate mask" (03/15/2017)   PAD (peripheral artery disease) (HCC)    AAA [place] in 06/2020: aorto iliac atherosclerosis   Tobacco use    Type II diabetes mellitus (HCC)    Varicose veins of both lower extremities  S/P ablation 02/2017    Family History  Problem Relation Age of Onset   Heart attack Maternal Uncle    Heart attack Paternal Grandmother    Cancer Maternal Grandfather    Heart attack Maternal Grandmother    Stroke Neg Hx    Hypertension Neg Hx     Past Surgical History:  Procedure Laterality Date   CARDIAC CATHETERIZATION N/A 09/11/2014   Procedure: Left Heart Cath and Coronary Angiography;  Surgeon: Marykay Lex, MD;  Location: [facility] INVASIVE CV LAB CUPID;  Service: Cardiovascular;  Laterality: N/A;   CARDIAC CATHETERIZATION  09/11/2014   Procedure: Coronary Stent Intervention;  Surgeon: Marykay Lex, MD;  Location: [facility] INVASIVE CV LAB CUPID;  Service: Cardiovascular;;   ENDOVENOUS ABLATION SAPHENOUS VEIN W/ LASER Right 02/22/2017   endovenous laser  ablation R GSV and stab phlebectomy >20 incisions R leg by Josephina Gip MD    LEFT HEART CATH AND CORONARY ANGIOGRAPHY N/A 04/01/2020   Procedure: LEFT HEART CATH AND CORONARY ANGIOGRAPHY;  Surgeon: Runell Gess, MD;  Location: [facility] INVASIVE CV LAB;  Service: Cardiovascular;  Laterality: N/A;   Social History   Occupational History   Occupation: Service water purification systems  Tobacco Use   Smoking status: Every Day    Current packs/day: 1.00    Average packs/day: 1 pack/day for 46.7 years (46.7 ttl pk-yrs)    Types: Cigarettes    Start date: 1978   Smokeless tobacco: Former    Types: Chew   Tobacco comments:    Smokes 5 packs a week. Tay 11/27/21  Vaping Use   Vaping status: Never Used  Substance and Sexual Activity   Alcohol use: Yes    Alcohol/week: 6.0 standard drinks of alcohol    Types: 6 Cans of beer per week   Drug use: No   Sexual activity: Not Currently

## 2023-02-01 ENCOUNTER — Other Ambulatory Visit: Payer: Self-pay | Admitting: Nurse Practitioner

## 2023-02-01 DIAGNOSIS — M519 Unspecified thoracic, thoracolumbar and lumbosacral intervertebral disc disorder: Secondary | ICD-10-CM

## 2023-02-02 ENCOUNTER — Other Ambulatory Visit: Payer: Self-pay

## 2023-02-02 ENCOUNTER — Encounter: Payer: Self-pay | Admitting: Internal Medicine

## 2023-02-02 DIAGNOSIS — M94262 Chondromalacia, left knee: Secondary | ICD-10-CM | POA: Insufficient documentation

## 2023-02-02 DIAGNOSIS — M545 Low back pain, unspecified: Secondary | ICD-10-CM | POA: Insufficient documentation

## 2023-02-02 MED ORDER — LIDOCAINE HCL 1 % IJ SOLN
0.5000 mL | INTRAMUSCULAR | Status: AC | PRN
Start: 2023-02-02 — End: 2023-02-02
  Administered 2023-02-02: .5 mL

## 2023-02-02 MED ORDER — METHYLPREDNISOLONE ACETATE 40 MG/ML IJ SUSP
40.0000 mg | INTRAMUSCULAR | Status: AC | PRN
Start: 2023-02-02 — End: 2023-02-02
  Administered 2023-02-02: 40 mg via INTRA_ARTICULAR

## 2023-02-02 MED ORDER — BUPIVACAINE HCL 0.5 % IJ SOLN
3.0000 mL | INTRAMUSCULAR | Status: AC | PRN
Start: 2023-02-02 — End: 2023-02-02
  Administered 2023-02-02: 3 mL via INTRA_ARTICULAR

## 2023-02-02 MED ORDER — GABAPENTIN 300 MG PO CAPS
300.0000 mg | ORAL_CAPSULE | Freq: Every day | ORAL | 1 refills | Status: DC
Start: 2023-02-02 — End: 2023-11-29
  Filled 2023-02-02 – 2023-02-11 (×3): qty 90, 90d supply, fill #0
  Filled 2023-06-09: qty 90, 90d supply, fill #1

## 2023-02-02 NOTE — Telephone Encounter (Signed)
Requested Prescriptions  Pending Prescriptions Disp Refills   gabapentin (NEURONTIN) 300 MG capsule 90 capsule 1    Sig: Take 1 capsule (300 mg total) by mouth at bedtime.     Neurology: Anticonvulsants - gabapentin Passed - 02/01/2023 12:42 PM      Passed - Cr in normal range and within 360 days    Creat  Date Value Ref Range Status  12/27/2015 0.75 0.70 - 1.33 mg/dL Final    Comment:      For patients > or = 62 years of age: The upper reference limit for Creatinine is approximately 13% higher for people identified as African-American.      Creatinine, Ser  Date Value Ref Range Status  01/20/2023 1.13 0.61 - 1.24 mg/dL Final   Creatinine, Urine  Date Value Ref Range Status  11/18/2015 390 (H) 20 - 370 mg/dL Final    Comment:    Result confirmed by automatic dilution. Result repeated and verified.          Passed - Completed PHQ-2 or PHQ-9 in the last 360 days      Passed - Valid encounter within last 12 months    Recent Outpatient Visits           5 days ago Splenic lesion   [facility] [place], [place] W, NP   2 months ago Type 2 diabetes mellitus with hyperglycemia, without long-term current use of insulin [facility])   [facility] [place], [place] W, NP   6 months ago Type 2 diabetes mellitus with hyperglycemia, without long-term current use of insulin [facility])   [facility] [place], [place] W, NP   10 months ago Chronic obstructive pulmonary disease, unspecified COPD type [facility])   [facility] [place], Shea Stakes, NP   10 months ago Primary hypertension   [facility] Claiborne Rigg, NP       Future Appointments             In 2 months [name], MD [facility]

## 2023-02-05 ENCOUNTER — Ambulatory Visit: Payer: Medicaid Other | Attending: Orthopaedic Surgery

## 2023-02-05 VITALS — BP 169/96

## 2023-02-05 DIAGNOSIS — G8929 Other chronic pain: Secondary | ICD-10-CM | POA: Insufficient documentation

## 2023-02-05 DIAGNOSIS — M5442 Lumbago with sciatica, left side: Secondary | ICD-10-CM | POA: Diagnosis not present

## 2023-02-05 DIAGNOSIS — R262 Difficulty in walking, not elsewhere classified: Secondary | ICD-10-CM | POA: Insufficient documentation

## 2023-02-05 DIAGNOSIS — M6281 Muscle weakness (generalized): Secondary | ICD-10-CM | POA: Diagnosis present

## 2023-02-05 DIAGNOSIS — M5441 Lumbago with sciatica, right side: Secondary | ICD-10-CM | POA: Insufficient documentation

## 2023-02-05 NOTE — Therapy (Signed)
OUTPATIENT PHYSICAL THERAPY THORACOLUMBAR EVALUATION   Patient Name: William Young MRN: [id number] DOB:[birth year], 62 y.o., male Today's Date: 02/05/2023  END OF SESSION:  PT End of Session - 02/05/23 1104     Visit Number 1    Number of Visits 1    Authorization Type [place] med[place] prepaid    PT Start Time 1102    PT Stop Time 1135    PT Time Calculation (min) 33 min    Activity Tolerance Patient tolerated treatment well    Behavior During Therapy [facility] for tasks assessed/performed             Past Medical History:  Diagnosis Date   Abdominal aortic aneurysm (AAA) (HCC)    CT 1/22: infrarenal 3.8 cm - repeat US in 2 years // AAA [place] 2/22: AAA 3.8 cm; aorto iliac atherosclerosis without hemodynamically significant stenosis   Aortic atherosclerosis (HCC)    CT in 03/2020   CAD (coronary artery disease) 09/2014   a. NSTEMI >> LHC 5/16:  mLAD 15, pRCA 90, mRCA 40, RPLB3 80, EF normal with inf HK >> PCI:  BMS to pRCA and DES to RPLB3 // Cath 11/21: Patent RCA stent, patent RPL stent; medical therapy    GERD (gastroesophageal reflux disease)    History of echocardiogram    Echo 6/16:  EF 50-55%, no RWMA, Gr 1 DD   HLD (hyperlipidemia)    Hattie Perch 03/15/2017   HTN (hypertension)    Hx of NSTEMI 5/16 tx with BMS to pRCA and DES to RPLB3 09/10/2014   Hyperglycemia    OSA (obstructive sleep apnea)    "dx'd; couldn't tolerate mask" (03/15/2017)   PAD (peripheral artery disease) (HCC)    AAA [place] in 06/2020: aorto iliac atherosclerosis   Tobacco use    Type II diabetes mellitus (HCC)    Varicose veins of both lower extremities    S/P ablation 02/2017   Past Surgical History:  Procedure Laterality Date   CARDIAC CATHETERIZATION N/A 09/11/2014   Procedure: Left Heart Cath and Coronary Angiography;  Surgeon: Marykay Lex, MD;  Location: [facility] INVASIVE CV LAB CUPID;  Service: Cardiovascular;  Laterality: N/A;   CARDIAC CATHETERIZATION  09/11/2014   Procedure: Coronary Stent Intervention;   Surgeon: Marykay Lex, MD;  Location: [facility] INVASIVE CV LAB CUPID;  Service: Cardiovascular;;   ENDOVENOUS ABLATION SAPHENOUS VEIN W/ LASER Right 02/22/2017   endovenous laser ablation R GSV and stab phlebectomy >20 incisions R leg by Josephina Gip MD    LEFT HEART CATH AND CORONARY ANGIOGRAPHY N/A 04/01/2020   Procedure: LEFT HEART CATH AND CORONARY ANGIOGRAPHY;  Surgeon: Runell Gess, MD;  Location: [facility] INVASIVE CV LAB;  Service: Cardiovascular;  Laterality: N/A;   Patient Active Problem List   Diagnosis Date Noted   Chondromalacia of knee, left 02/02/2023   Low back pain 02/02/2023   AKI (acute kidney injury) (HCC) 04/22/2022   Type 2 diabetes mellitus with complication, without long-term current use of insulin (HCC) 04/22/2022   Hypotension 03/30/2022   COPD (chronic obstructive pulmonary disease) (HCC) 11/27/2021   SOB (shortness of breath) 09/03/2021   Pulmonary nodules 07/12/2020   Peripheral arterial disease (HCC)    Chronic diastolic heart failure (HCC) 07/06/2020   Aortic atherosclerosis (HCC) 05/26/2020   AAA (abdominal aortic aneurysm) without rupture (HCC) 05/26/2020   Thrombocytopenia (HCC) 08/13/2017   Dizziness    Precordial pain    History of coronary artery disease    Varicose veins of right lower extremity with  complications 10/27/2016   Hyperlipidemia LDL goal <70 09/28/2014   Obstructive sleep apnea 09/14/2014   Morbid obesity (HCC) 09/12/2014   Coronary artery disease involving native coronary artery with angina pectoris (HCC)    Hx of NSTEMI 5/16 tx with BMS to pRCA and DES to RPLB3 09/10/2014   Essential hypertension 09/10/2014   Tobacco use    GERD (gastroesophageal reflux disease)     PCP: Bertram Denver, NP  REFERRING PROVIDER: Annell Greening, MD  REFERRING DIAG: M54.42,M54.41,G89.29 (ICD-10-CM) - Chronic bilateral low back pain with bilateral sciatica   Rationale for Evaluation and Treatment: Rehabilitation  THERAPY DIAG:  Muscle weakness  (generalized)  Difficulty in walking, not elsewhere classified  ONSET DATE: 01/29/23 referral  SUBJECTIVE:                                                                                                                                                                                           SUBJECTIVE STATEMENT: Patient arrives to clinic with SO who remained in lobby, no AD. Reports B knee pain, started many years ago, but increased significantly in May after a concert. Recently had a steroid injection in the L knee. No talks of knee replacement. Does also have low back pain. This started many years ago. Per patient he has "disc issues." Is an avid golfer.  PERTINENT HISTORY:  DM, GERD, MI, HLD, HTN, CAD, AAA, OSA  PAIN:  Are you having pain? Yes: NPRS scale: 3-4/10 Pain location: low back, B knees  PRECAUTIONS: Fall  RED FLAGS: Bowel or bladder incontinence: No   WEIGHT BEARING RESTRICTIONS: No  FALLS:  Has patient fallen in last 6 months? No  PLOF: Independent; works 14 hours at [employer]  PATIENT GOALS: "arthritis is arthritis"  OBJECTIVE:   DIAGNOSTIC FINDINGS:  L knee x ray 11/10/22 IMPRESSION: Mild patellofemoral osteoarthritis.  PATIENT SURVEYS:  Modified Oswestry mild disability    SCREENING FOR RED FLAGS: Bowel or bladder incontinence: No Spinal tumors: No Cauda equina syndrome: No Compression fracture: No Abdominal aneurysm: No  COGNITION: Overall cognitive status: Within functional limits for tasks assessed     SENSATION: WFL   POSTURE: rounded shoulders, forward head, posterior pelvic tilt, and flexed trunk     LUMBAR ROM:  Slightly limited   LOWER EXTREMITY MMT:   WFL   GAIT: Distance walked: clinic  Slight trendelenburg noted TODAY'S TREATMENT:  N/a eval   PATIENT EDUCATION:  Education details: PT POC,  exam findings, ways to mitigate pain, arthritis pain pattern Person educated: Patient Education method: Explanation Education comprehension: verbalized understanding    ASSESSMENT:  CLINICAL IMPRESSION: Patient is a 62 y.o. male who was seen today for physical therapy evaluation and treatment for low back pain. He presents with no MOI and currently minimal pain. He states that it will flare up at times, especially after working long hours, but then will settle back down. He scored in the mild disability category of the Oswestry. He states that he would prefer to maintain his normal activities and forms of exercise (golfing) and will trial PT at a later time should he feel like he needs it. Patient declining to participate in skilled PT at this time.     CLINICAL DECISION MAKING: Stable/uncomplicated  EVALUATION COMPLEXITY: Low   GOALS: Not indicated as patient does not wish to participate in PT at this time.  PLAN:  PT FREQUENCY: one time visit  PT DURATION: other: 1x visit   Westley Foots, PT Westley Foots, PT, DPT, CBIS  02/05/2023, 11:45 AM

## 2023-02-11 ENCOUNTER — Other Ambulatory Visit: Payer: Self-pay

## 2023-02-16 ENCOUNTER — Other Ambulatory Visit: Payer: Self-pay | Admitting: Nurse Practitioner

## 2023-02-16 DIAGNOSIS — G8929 Other chronic pain: Secondary | ICD-10-CM

## 2023-02-19 ENCOUNTER — Other Ambulatory Visit: Payer: Self-pay | Admitting: Family Medicine

## 2023-02-19 ENCOUNTER — Other Ambulatory Visit: Payer: Self-pay | Admitting: Nurse Practitioner

## 2023-02-19 ENCOUNTER — Other Ambulatory Visit: Payer: Self-pay

## 2023-02-19 DIAGNOSIS — M519 Unspecified thoracic, thoracolumbar and lumbosacral intervertebral disc disorder: Secondary | ICD-10-CM

## 2023-02-19 DIAGNOSIS — E1165 Type 2 diabetes mellitus with hyperglycemia: Secondary | ICD-10-CM

## 2023-02-19 MED ORDER — TIZANIDINE HCL 4 MG PO TABS
4.0000 mg | ORAL_TABLET | Freq: Three times a day (TID) | ORAL | 0 refills | Status: DC | PRN
  Filled 2023-02-19: qty 90, 30d supply, fill #0

## 2023-02-19 MED ORDER — METFORMIN HCL 500 MG PO TABS
500.0000 mg | ORAL_TABLET | Freq: Two times a day (BID) | ORAL | 1 refills | Status: DC
Start: 2023-02-19 — End: 2023-11-29
  Filled 2023-02-19: qty 180, 90d supply, fill #0
  Filled 2023-11-22: qty 180, 90d supply, fill #1

## 2023-02-21 MED ORDER — ACETAMINOPHEN-CODEINE 300-30 MG PO TABS
1.0000 | ORAL_TABLET | Freq: Every day | ORAL | 0 refills | Status: DC | PRN
Start: 2023-02-21 — End: 2023-03-30
  Filled 2023-02-21: qty 30, 30d supply, fill #0

## 2023-02-22 ENCOUNTER — Other Ambulatory Visit: Payer: Self-pay

## 2023-02-23 ENCOUNTER — Other Ambulatory Visit: Payer: Self-pay

## 2023-02-26 ENCOUNTER — Other Ambulatory Visit: Payer: Self-pay

## 2023-02-26 ENCOUNTER — Other Ambulatory Visit: Payer: Self-pay | Admitting: Physician Assistant

## 2023-02-26 ENCOUNTER — Other Ambulatory Visit: Payer: Self-pay | Admitting: Nurse Practitioner

## 2023-02-26 DIAGNOSIS — E1165 Type 2 diabetes mellitus with hyperglycemia: Secondary | ICD-10-CM

## 2023-02-26 MED ORDER — GLIMEPIRIDE 4 MG PO TABS
8.0000 mg | ORAL_TABLET | Freq: Every day | ORAL | 1 refills | Status: DC
  Filled 2023-02-26: qty 180, 90d supply, fill #0
  Filled 2023-05-27: qty 180, 90d supply, fill #1

## 2023-02-26 MED ORDER — EMPAGLIFLOZIN 25 MG PO TABS
25.0000 mg | ORAL_TABLET | Freq: Every day | ORAL | 1 refills | Status: DC
  Filled 2023-02-26: qty 90, 90d supply, fill #0
  Filled 2023-05-27: qty 90, 90d supply, fill #1

## 2023-03-02 ENCOUNTER — Other Ambulatory Visit: Payer: Self-pay

## 2023-03-16 ENCOUNTER — Other Ambulatory Visit: Payer: Self-pay

## 2023-03-30 ENCOUNTER — Other Ambulatory Visit: Payer: Self-pay | Admitting: Nurse Practitioner

## 2023-03-30 DIAGNOSIS — G8929 Other chronic pain: Secondary | ICD-10-CM

## 2023-03-30 MED ORDER — ACETAMINOPHEN-CODEINE 300-30 MG PO TABS
1.0000 | ORAL_TABLET | Freq: Every day | ORAL | 0 refills | Status: DC | PRN
  Filled 2023-03-30: qty 30, 30d supply, fill #0

## 2023-03-31 ENCOUNTER — Other Ambulatory Visit: Payer: Self-pay

## 2023-04-13 ENCOUNTER — Other Ambulatory Visit: Payer: Self-pay | Admitting: Family Medicine

## 2023-04-13 ENCOUNTER — Other Ambulatory Visit: Payer: Self-pay

## 2023-04-13 DIAGNOSIS — E785 Hyperlipidemia, unspecified: Secondary | ICD-10-CM

## 2023-04-13 MED ORDER — ROSUVASTATIN CALCIUM 40 MG PO TABS
40.0000 mg | ORAL_TABLET | Freq: Every day | ORAL | 0 refills | Status: DC
  Filled 2023-04-13: qty 90, 90d supply, fill #0

## 2023-04-14 ENCOUNTER — Other Ambulatory Visit (HOSPITAL_BASED_OUTPATIENT_CLINIC_OR_DEPARTMENT_OTHER): Payer: Self-pay

## 2023-04-14 ENCOUNTER — Other Ambulatory Visit: Payer: Self-pay

## 2023-04-14 ENCOUNTER — Ambulatory Visit: Payer: Medicaid Other | Attending: Internal Medicine | Admitting: Internal Medicine

## 2023-04-14 ENCOUNTER — Encounter: Payer: Self-pay | Admitting: Internal Medicine

## 2023-04-14 VITALS — BP 165/90 | HR 84 | Resp 14 | Ht 75.0 in | Wt 320.4 lb

## 2023-04-14 DIAGNOSIS — Z79899 Other long term (current) drug therapy: Secondary | ICD-10-CM

## 2023-04-14 DIAGNOSIS — I714 Abdominal aortic aneurysm, without rupture, unspecified: Secondary | ICD-10-CM

## 2023-04-14 MED ORDER — AMLODIPINE BESYLATE 2.5 MG PO TABS: 2.5000 mg | ORAL_TABLET | Freq: Every day | ORAL | 3 refills | Status: DC

## 2023-04-14 NOTE — Progress Notes (Signed)
Cardiology Office Note   Date:  04/14/2023   ID:  Deunte Critcher, DOB [birth year], MRN [id number]  PCP:  Claiborne Rigg, NP  Cardiologist:   Dietrich Pates, MD   F/U of CAD    History of Present Illness: Jencarlo Alessandrini is a 62 y.o. male with a history of CAD, DM, GERD, HTN, HL, AAA (USN 3.8 cm March 2023), COPD, OSA, venous insufficency   In 09/2014 he had a NSTEMI  LHC  showed single vessel CAD with 90% prox RCA and 80% R PLSA.  Underwent PCI/BMS to prox RCA and resolute DES to RPLB3.  Hx of ASA allergy so treated  With Brilinta for 1 year then continued on plavix.    Pt admitted in Nov 2018 with CP  Myovue post discharge showed no ischemia   LVEF 38%  Echo showed LVEF 55 to 60^ with inferolateral hypokinesis  2021:  LHC showed patent RCA, RPL stents       May 2023   Myoview showed no ischemia or infarct   LVEF 47% TTE   LVEF 50 to 55% (may 2023)    Nov 2023the pt  was hypotensive  After  Developed renal insuff  Cr 2.54.  This improved to 1.01 by end of November  Since the pt was in clinic he denies CP   Breathing is OK  No dizziness  He says his bp is high because he is in a lot of pain from arthritis  Pain limits his activity   Current Meds  Medication Sig   acetaminophen-codeine (TYLENOL #3) 300-30 MG tablet Take 1 tablet by mouth daily as needed for moderate pain (pain score 4-6).   Blood Glucose Monitoring Suppl (TRUE METRIX METER) w/Device KIT Use as instructed. Check blood glucose level by fingerstick twice per day. E11.65   clopidogrel (PLAVIX) 75 MG tablet Take 1 tablet (75 mg total) by mouth daily.   empagliflozin (JARDIANCE) 25 MG TABS tablet Take 1 tablet (25 mg total) by mouth daily before breakfast.   ezetimibe (ZETIA) 10 MG tablet Take 1 tablet (10 mg total) by mouth daily.   fluticasone furoate-vilanterol (BREO ELLIPTA) 200-25 MCG/ACT AEPB Inhale 1 puff into the lungs daily.   gabapentin (NEURONTIN) 300 MG capsule Take 1 capsule (300 mg total) by mouth at bedtime.    glimepiride (AMARYL) 4 MG tablet Take 2 tablets (8 mg total) by mouth daily with breakfast.   lidocaine (LIDODERM) 5 % Place 1 patch onto the skin daily. Remove & Discard patch within 12 hours or as directed by MD   losartan (COZAAR) 50 MG tablet Take 1 tablet (50 mg total) by mouth daily.   metFORMIN (GLUCOPHAGE) 500 MG tablet Take 1 tablet (500 mg total) by mouth 2 (two) times daily with a meal.   nitroGLYCERIN (NITROSTAT) 0.4 MG SL tablet Place 1 tablet (0.4 mg total) under the tongue every 5 (five) minutes x 3 doses as needed for chest pain.   rosuvastatin (CRESTOR) 40 MG tablet Take 1 tablet (40 mg total) by mouth daily.   tiZANidine (ZANAFLEX) 4 MG tablet Take 1 tablet (4 mg total) by mouth every 8 (eight) hours as needed for muscle spasms.   Vitamin A 7.5 MG (25000 UT) CAPS Take 50,000 Units by mouth once a week.     Allergies:   Aspirin and Ibuprofen   Past Medical History:  Diagnosis Date   Abdominal aortic aneurysm (AAA) (HCC)    CT 1/22: infrarenal 3.8 cm - repeat US in  2 years // AAA [place] 2/22: AAA 3.8 cm; aorto iliac atherosclerosis without hemodynamically significant stenosis   Aortic atherosclerosis (HCC)    CT in 03/2020   CAD (coronary artery disease) 09/2014   a. NSTEMI >> LHC 5/16:  mLAD 15, pRCA 90, mRCA 40, RPLB3 80, EF normal with inf HK >> PCI:  BMS to pRCA and DES to RPLB3 // Cath 11/21: Patent RCA stent, patent RPL stent; medical therapy    GERD (gastroesophageal reflux disease)    History of echocardiogram    Echo 6/16:  EF 50-55%, no RWMA, Gr 1 DD   HLD (hyperlipidemia)    Hattie Perch 03/15/2017   HTN (hypertension)    Hx of NSTEMI 5/16 tx with BMS to pRCA and DES to RPLB3 09/10/2014   Hyperglycemia    OSA (obstructive sleep apnea)    "dx'd; couldn't tolerate mask" (03/15/2017)   PAD (peripheral artery disease) (HCC)    AAA [place] in 06/2020: aorto iliac atherosclerosis   Tobacco use    Type II diabetes mellitus (HCC)    Varicose veins of both lower extremities     S/P ablation 02/2017    Past Surgical History:  Procedure Laterality Date   CARDIAC CATHETERIZATION N/A 09/11/2014   Procedure: Left Heart Cath and Coronary Angiography;  Surgeon: Marykay Lex, MD;  Location: [facility] INVASIVE CV LAB CUPID;  Service: Cardiovascular;  Laterality: N/A;   CARDIAC CATHETERIZATION  09/11/2014   Procedure: Coronary Stent Intervention;  Surgeon: Marykay Lex, MD;  Location: [facility] INVASIVE CV LAB CUPID;  Service: Cardiovascular;;   ENDOVENOUS ABLATION SAPHENOUS VEIN W/ LASER Right 02/22/2017   endovenous laser ablation R GSV and stab phlebectomy >20 incisions R leg by Josephina Gip MD    LEFT HEART CATH AND CORONARY ANGIOGRAPHY N/A 04/01/2020   Procedure: LEFT HEART CATH AND CORONARY ANGIOGRAPHY;  Surgeon: Runell Gess, MD;  Location: [facility] INVASIVE CV LAB;  Service: Cardiovascular;  Laterality: N/A;     Social History:  The patient  reports that he has been smoking cigarettes. He started smoking about 46 years ago. He has a 46.9 pack-year smoking history. He has quit using smokeless tobacco.  His smokeless tobacco use included chew. He reports current alcohol use of about 6.0 standard drinks of alcohol per week. He reports that he does not use drugs.   Family History:  The patient's family history includes Cancer in his maternal grandfather; Heart attack in his maternal grandmother, maternal uncle, and paternal grandmother.    ROS:  Please see the history of present illness. All other systems are reviewed and  Negative to the above problem except as noted.    PHYSICAL EXAM: VS:  BP (!) 165/90 (BP Location: Right Arm, Patient Position: Sitting, Cuff Size: Large)   Pulse 84   Resp 14   Ht 6\' 3"  (1.905 m)   Wt (!) 320 lb 6.4 oz (145.3 kg)   SpO2 96%   BMI 40.05 kg/m   GEN: Morbidly obese 62 yo in no acute distress  HEENT: normal  Neck: Neck is full  No bruit Cardiac: RRR; no murmurs   No LE  edema   Respiratory:  clear to auscultation GI: soft, obese No  hepatomegaly   EKG:  EKG is not done    Lipid Panel    Component Value Date/Time   CHOL 99 (L) 03/30/2022 1001   TRIG 147 03/30/2022 1001   HDL 29 (L) 03/30/2022 1001   CHOLHDL 3.4 03/30/2022 1001   CHOLHDL 6.8  03/16/2017 0439   VLDL 33 03/16/2017 0439   LDLCALC 44 03/30/2022 1001      Wt Readings from Last 3 Encounters:  04/14/23 (!) 320 lb 6.4 oz (145.3 kg)  01/28/23 (!) 312 lb 6.4 oz (141.7 kg)  01/20/23 (!) 311 lb (141.1 kg)      ASSESSMENT AND PLAN:  1  CAD  No symptoms of angina   Follow    2  HTN BP is very high  He says because he is having so much joint pain     I have given him a rx for amlodipine 2.5 mg    Use on days he has to move around more or if his BP is high  [place]   3  HL   last lipids LDL 44  HDL 29 (Nov 2023)   Will repeat today     4  Hx HFpEF  Volume is OK  5  AAA Last CT this fall AAA measured 4.4 cm  THis is increased from previous   Set up for CT this spring   6  PAD  MIld PAD of legs     7  Pulmonary   Will contact R Byrum for follow up     8  DM Last A1C 6.7  Watch/limit carbs     Follow up later this year   Current medicines are reviewed at length with the patient today.  The patient does not have concerns regarding medicines.  Signed, Dietrich Pates, MD  04/14/2023 11:00 AM    [facility] [address] [place], [place], [place]  [address] Phone: [phone]; Fax: [phone]

## 2023-04-14 NOTE — Patient Instructions (Addendum)
Medication:   Amlodipine 2.5 mg on the days that you work   *If you need a refill on your cardiac medications before your next appointment, please call your pharmacy*   Lab Work: Bmet  If you have labs (blood work) drawn today and your tests are completely normal, you will receive your results only by: MyChart Message (if you have MyChart) OR A paper copy in the mail If you have any lab test that is abnormal or we need to change your treatment, we will call you to review the results.   Testing/Procedures:  CT AORTA    Follow-Up: At [facility], you and your health needs are our priority.  As part of our continuing mission to provide you with exceptional heart care, we have created designated Provider Care Teams.  These Care Teams include your primary Cardiologist (physician) and Advanced Practice Providers (APPs -  Physician Assistants and Nurse Practitioners) who all work together to provide you with the care you need, when you need it.  We recommend signing up for the patient portal called "MyChart".  Sign up information is provided on this After Visit Summary.  MyChart is used to connect with patients for Virtual Visits (Telemedicine).  Patients are able to view lab/test results, encounter notes, upcoming appointments, etc.  Non-urgent messages can be sent to your provider as well.   To learn more about what you can do with MyChart, go to [email or web address].    Your next appointment:  6 months

## 2023-04-15 ENCOUNTER — Ambulatory Visit: Payer: Self-pay

## 2023-04-15 LAB — BASIC METABOLIC PANEL
BUN/Creatinine Ratio: 21 (ref 10–24)
BUN: 26 mg/dL (ref 8–27)
CO2: 21 mmol/L (ref 20–29)
Calcium: 9.1 mg/dL (ref 8.6–10.2)
Chloride: 104 mmol/L (ref 96–106)
Creatinine, Ser: 1.26 mg/dL (ref 0.76–1.27)
Glucose: 135 mg/dL — ABNORMAL HIGH (ref 70–99)
Potassium: 5 mmol/L (ref 3.5–5.2)
Sodium: 136 mmol/L (ref 134–144)
eGFR: 64 mL/min/{1.73_m2} (ref >59)

## 2023-04-15 NOTE — Telephone Encounter (Signed)
Chief Complaint: Right elbow pain Symptoms: swelling to elbow, numbness to fingers, pain shoots down arm 6-7/10 when not working, 10/10 at work, hard to lift arm above head Frequency: Ongoing 2 months, getting worse Pertinent Negatives: Patient denies other symptoms Disposition: [] ED /[] Urgent Care (no appt availability in office) / [] Appointment(In office/virtual)/ [x]  [facility]/ [] Home Care/ [] Refused Recommended Disposition /[] [facility]/ []  Follow-up with PCP Additional Notes: No availability in the office with PCP or any provider until January, patient agreed to that appointment, scheduled for 05/13/23 with Georgian Co, PA. Asked patient if he would be interested in a virtual visit with a [facility] provider sooner, he agrees and says not today. Scheduled for tomorrow. He says to keep the January appointment as a follow up.    Reason for Disposition  [1] MODERATE pain (e.g., interferes with normal activities) AND [2] present > 3 days  Answer Assessment - Initial Assessment Questions 1. ONSET: "When did the pain start?"     2 months, but getting to be unbearable 2. LOCATION: "Where is the pain located?"     Right elbow, shoots down arm 3. PAIN: "How bad is the pain?" (Scale 1-10; or mild, moderate, severe)   - MILD (1-3): doesn't interfere with normal activities.   - MODERATE (4-7): interferes with normal activities (e.g., work or school) or awakens from sleep.   - SEVERE (8-10): excruciating pain, unable to do any normal activities, unable to use arm at all.     10 at work, 6-7 when not working and off for a few days 4. WORK OR EXERCISE: "Has there been any recent work or exercise that involved this part of the body?"     I don't remember 5. CAUSE: "What do you think is causing the elbow pain?"     Unsure 6. OTHER SYMPTOMS: "Do you have any other symptoms?" (e.g., neck pain, elbow swelling, rash, fever)     Elbow swelling, numbness to the right  arm/fingers  Protocols used: Elbow Pain-A-AH

## 2023-04-15 NOTE — Telephone Encounter (Signed)
Noted  

## 2023-04-16 ENCOUNTER — Telehealth: Payer: Medicaid Other | Admitting: Family Medicine

## 2023-04-16 ENCOUNTER — Other Ambulatory Visit: Payer: Self-pay

## 2023-04-16 ENCOUNTER — Telehealth: Payer: Self-pay | Admitting: Internal Medicine

## 2023-04-16 DIAGNOSIS — M7711 Lateral epicondylitis, right elbow: Secondary | ICD-10-CM | POA: Diagnosis not present

## 2023-04-16 MED ORDER — AMLODIPINE BESYLATE 2.5 MG PO TABS
2.5000 mg | ORAL_TABLET | Freq: Every day | ORAL | 3 refills | Status: DC
  Filled 2023-04-16: qty 90, 90d supply, fill #0

## 2023-04-16 MED ORDER — PREDNISONE 20 MG PO TABS
20.0000 mg | ORAL_TABLET | Freq: Two times a day (BID) | ORAL | 0 refills | Status: AC
Start: 2023-04-16 — End: 2023-04-21
  Filled 2023-04-16: qty 10, 5d supply, fill #0

## 2023-04-16 NOTE — Telephone Encounter (Signed)
Spoke with crystal, aware refill sent to the correct pharmacy.

## 2023-04-16 NOTE — Progress Notes (Signed)
Virtual Visit Consent   [employer], you are scheduled for a virtual visit with a [facility] provider today. Just as with appointments in the office, your consent must be obtained to participate. Your consent will be active for this visit and any virtual visit you may have with one of our providers in the next 365 days. If you have a MyChart account, a copy of this consent can be sent to you electronically.  As this is a virtual visit, video technology does not allow for your provider to perform a traditional examination. This may limit your provider's ability to fully assess your condition. If your provider identifies any concerns that need to be evaluated in person or the need to arrange testing (such as labs, EKG, etc.), we will make arrangements to do so. Although advances in technology are sophisticated, we cannot ensure that it will always work on either your end or our end. If the connection with a video visit is poor, the visit may have to be switched to a telephone visit. With either a video or telephone visit, we are not always able to ensure that we have a secure connection.  By engaging in this virtual visit, you consent to the provision of healthcare and authorize for your insurance to be billed (if applicable) for the services provided during this visit. Depending on your insurance coverage, you may receive a charge related to this service.  I need to obtain your verbal consent now. Are you willing to proceed with your visit today? William Young has provided verbal consent on 04/16/2023 for a virtual visit (video or telephone). Georgana Curio, FNP  Date: 04/16/2023 10:03 AM  Virtual Visit via Video Note   I, Georgana Curio, connected with  William Young  ([id number], October 13, 1960) on 04/16/23 at 10:00 AM EST by a video-enabled telemedicine application and verified that I am speaking with the correct person using two identifiers.  Location: Patient: Virtual Visit Location Patient:  Home Provider: Virtual Visit Location Provider: Home Office   I discussed the limitations of evaluation and management by telemedicine and the availability of in person appointments. The patient expressed understanding and agreed to proceed.    History of Present Illness: William Young is a 62 y.o. who identifies as a male who was assigned male at birth, and is being seen today for pain in rt elbow with no known injury. Tender over rt lateral epicondyle. Has been performing repetitive motion recently. Marland Kitchen  HPI: HPI  Problems:  Patient Active Problem List   Diagnosis Date Noted   Chondromalacia of knee, left 02/02/2023   Low back pain 02/02/2023   AKI (acute kidney injury) (HCC) 04/22/2022   Type 2 diabetes mellitus with complication, without long-term current use of insulin (HCC) 04/22/2022   Hypotension 03/30/2022   COPD (chronic obstructive pulmonary disease) (HCC) 11/27/2021   SOB (shortness of breath) 09/03/2021   Pulmonary nodules 07/12/2020   Peripheral arterial disease (HCC)    Chronic diastolic heart failure (HCC) 07/06/2020   Aortic atherosclerosis (HCC) 05/26/2020   AAA (abdominal aortic aneurysm) without rupture (HCC) 05/26/2020   Thrombocytopenia (HCC) 08/13/2017   Dizziness    Precordial pain    History of coronary artery disease    Varicose veins of right lower extremity with complications 10/27/2016   Hyperlipidemia LDL goal <70 09/28/2014   Obstructive sleep apnea 09/14/2014   Morbid obesity (HCC) 09/12/2014   Coronary artery disease involving native coronary artery with angina pectoris (HCC)    Hx of NSTEMI  5/16 tx with BMS to pRCA and DES to RPLB3 09/10/2014   Essential hypertension 09/10/2014   Tobacco use    GERD (gastroesophageal reflux disease)     Allergies:  Allergies  Allergen Reactions   Aspirin Anaphylaxis, Swelling and Rash    Throat closes    Ibuprofen Anaphylaxis, Swelling and Rash   Medications:  Current Outpatient Medications:     acetaminophen-codeine (TYLENOL #3) 300-30 MG tablet, Take 1 tablet by mouth daily as needed for moderate pain (pain score 4-6)., Disp: 30 tablet, Rfl: 0   amLODipine (NORVASC) 2.5 MG tablet, Take 1 tablet (2.5 mg total) by mouth daily., Disp: 90 tablet, Rfl: 3   Blood Glucose Monitoring Suppl (TRUE METRIX METER) w/Device KIT, Use as instructed. Check blood glucose level by fingerstick twice per day. E11.65, Disp: 1 kit, Rfl: 0   clopidogrel (PLAVIX) 75 MG tablet, Take 1 tablet (75 mg total) by mouth daily., Disp: 90 tablet, Rfl: 3   empagliflozin (JARDIANCE) 25 MG TABS tablet, Take 1 tablet (25 mg total) by mouth daily before breakfast., Disp: 90 tablet, Rfl: 1   ezetimibe (ZETIA) 10 MG tablet, Take 1 tablet (10 mg total) by mouth daily., Disp: 90 tablet, Rfl: 2   fluticasone furoate-vilanterol (BREO ELLIPTA) 200-25 MCG/ACT AEPB, Inhale 1 puff into the lungs daily., Disp: 60 each, Rfl: 11   gabapentin (NEURONTIN) 300 MG capsule, Take 1 capsule (300 mg total) by mouth at bedtime., Disp: 90 capsule, Rfl: 1   glimepiride (AMARYL) 4 MG tablet, Take 2 tablets (8 mg total) by mouth daily with breakfast., Disp: 180 tablet, Rfl: 1   lidocaine (LIDODERM) 5 %, Place 1 patch onto the skin daily. Remove & Discard patch within 12 hours or as directed by MD, Disp: 30 patch, Rfl: 0   losartan (COZAAR) 50 MG tablet, Take 1 tablet (50 mg total) by mouth daily., Disp: 90 tablet, Rfl: 3   metFORMIN (GLUCOPHAGE) 500 MG tablet, Take 1 tablet (500 mg total) by mouth 2 (two) times daily with a meal., Disp: 180 tablet, Rfl: 1   nitroGLYCERIN (NITROSTAT) 0.4 MG SL tablet, Place 1 tablet (0.4 mg total) under the tongue every 5 (five) minutes x 3 doses as needed for chest pain., Disp: 25 tablet, Rfl: 12   predniSONE (DELTASONE) 20 MG tablet, Take 1 tablet (20 mg total) by mouth 2 (two) times daily with a meal for 5 days. Stop prednisone if glucose increases over 250., Disp: 10 tablet, Rfl: 0   rosuvastatin (CRESTOR) 40 MG  tablet, Take 1 tablet (40 mg total) by mouth daily., Disp: 90 tablet, Rfl: 0   tiZANidine (ZANAFLEX) 4 MG tablet, Take 1 tablet (4 mg total) by mouth every 8 (eight) hours as needed for muscle spasms., Disp: 90 tablet, Rfl: 0   Vitamin A 7.5 MG (25000 UT) CAPS, Take 50,000 Units by mouth once a week., Disp: 30 capsule, Rfl:   Observations/Objective: Patient is well-developed, well-nourished in no acute distress.  Resting comfortably  at home.  Head is normocephalic, atraumatic.  No labored breathing.  Speech is clear and coherent with logical content.  Patient is alert and oriented at baseline.    Assessment and Plan: 1. Right lateral epicondylitis  Ice, unable to take ibuprofen, stop prednisone if glucose increases over 250. Get a tennis elbow strap at pharmacy.   Follow Up Instructions: I discussed the assessment and treatment plan with the patient. The patient was provided an opportunity to ask questions and all were answered. The patient agreed  with the plan and demonstrated an understanding of the instructions.  A copy of instructions were sent to the patient via MyChart unless otherwise noted below.     The patient was advised to call back or seek an in-person evaluation if the symptoms worsen or if the condition fails to improve as anticipated.    Georgana Curio, FNP

## 2023-04-16 NOTE — Telephone Encounter (Signed)
Pt c/o medication issue:  1. Name of Medication: amLODipine (NORVASC) 2.5 MG tablet   2. How are you currently taking this medication (dosage and times per day)?   3. Are you having a reaction (difficulty breathing--STAT)?   4. What is your medication issue? Patient's significant called stating this medication should have been called into  [facility] - [facility]

## 2023-04-16 NOTE — Patient Instructions (Signed)
Tennis Elbow  Tennis elbow (lateral epicondylitis) is inflammation of tendons in your outer forearm, near your elbow. Tendons are tissues that connect muscle to bone. When you have tennis elbow, inflammation affects the tendons that you use to bend your wrist and move your hand up. Inflammation occurs in the lower part of the upper arm bone (humerus), where the tendons connect to the bone (lateral epicondyle). Tennis elbow often affects people who play tennis, but anyone may get the condition from repeatedly extending the wrist or turning the forearm. What are the causes? This condition is usually caused by repeatedly extending the wrist, turning the forearm, and using the hands. It can result from sports or work that requires repetitive forearm movements. In some cases, it may be caused by a sudden injury. What increases the risk? You are more likely to develop tennis elbow if you play tennis or another racket sport. You also have a higher risk if you frequently use your hands for work. Besides people who play tennis, others at greater risk include: People who use computers. Construction workers. People who work in factories. Musicians. Cooks. Cashiers. What are the signs or symptoms? Symptoms of this condition include: Pain and tenderness in the forearm and the outer part of the elbow. Pain may be felt only when using the arm, or it may be there all the time. A burning feeling that starts in the elbow and spreads down the forearm. A weak grip in the hand. How is this diagnosed? This condition is diagnosed based on your symptoms, your medical history, and a physical exam. You may also have X-rays or an MRI to: Confirm the diagnosis. Look for other issues. Check for tears in the ligaments, muscles, or tendons. How is this treated? Resting and icing your arm is often the first treatment. Your health care provider may also recommend: Medicines to reduce pain and inflammation. These may be in  the form of a pill, topical gels, or shots of a steroid medicine (cortisone). An elbow strap to reduce stress on the area. Physical therapy. This may include massage or exercises or both. An elbow brace to restrict the movements that cause symptoms. If these treatments do not help relieve your symptoms, your health care provider may recommend surgery to remove damaged muscle and reattach healthy muscle to bone. Follow these instructions at home: If you have a brace or strap: Wear the brace or strap as told by your health care provider. Remove it only as told by your health care provider. Check the skin around the brace or strap every day. Tell your health care provider about any concerns. Loosen the brace if your fingers tingle, become numb, or turn cold and blue. Keep the brace clean. If the brace or strap is not waterproof: Do not let it get wet. Cover it with a watertight covering when you take a bath or a shower. Managing pain, stiffness, and swelling  If directed, put ice on the injured area. To do this: If you have a removable brace or strap, remove it as told by your health care provider. Put ice in a plastic bag. Place a towel between your skin and the bag. Leave the ice on for 20 minutes, 2-3 times a day. Remove the ice if your skin turns bright red. This is very important. If you cannot feel pain, heat, or cold, you have a greater risk of damage to the area. Move your fingers often to reduce stiffness and swelling. Activity Rest your elbow   and wrist and avoid activities that cause symptoms as told by your health care provider. Do physical therapy exercises as told by your health care provider. If you lift an object, lift it with your palm facing up. This reduces stress on your elbow. Lifestyle If your tennis elbow is caused by sports, check your equipment and make sure that: You use it correctly. It is good match for you. If your tennis elbow is caused by work or computer  use, take frequent breaks to stretch your arm. Talk with your employer about ways to manage your condition at work. General instructions Take over-the-counter and prescription medicines only as told by your health care provider. Do not use any products that contain nicotine or tobacco. These products include cigarettes, chewing tobacco, and vaping devices, such as e-cigarettes. If you need help quitting, ask your health care provider. Keep all follow-up visits. This is important. How is this prevented? Before and after activity: Warm up and stretch before being active. Cool down and stretch after being active. Give your body time to rest between periods of activity. During activity: Make sure to use equipment that fits you. If you play tennis, put power in your stroke with your lower body. Avoid using your arm only. Maintain physical fitness, including: Strength. Flexibility. Endurance. Do exercises to strengthen the forearm muscles. Contact a health care provider if: You have pain that gets worse or does not get better with treatment. You have numbness or weakness in your forearm, hand, or fingers. Get help right away if: Your pain is severe. You cannot move your wrist. Summary Tennis elbow (lateral epicondylitis) is inflammation of tendons in your outer forearm, near your elbow. Common symptoms include pain and tenderness in your forearm and the outer part of your elbow. This condition is usually caused by repeatedly extending your wrist, turning your forearm, and using your hands. The first treatment is often resting and icing your arm to relieve symptoms. Further treatment may include taking medicine, getting physical therapy, wearing a brace or strap, or having surgery. This information is not intended to replace advice given to you by your health care provider. Make sure you discuss any questions you have with your health care provider. Document Revised: 11/07/2019 Document  Reviewed: 11/07/2019 Elsevier Patient Education  2024 [employer] Inc.  

## 2023-04-19 ENCOUNTER — Other Ambulatory Visit: Payer: Self-pay

## 2023-04-22 ENCOUNTER — Ambulatory Visit (HOSPITAL_COMMUNITY)
Admission: RE | Admit: 2023-04-22 | Discharge: 2023-04-22 | Disposition: A | Discharge status: Home / Self Care | Payer: Medicaid Other | Source: Ambulatory Visit | Attending: Internal Medicine | Admitting: Internal Medicine

## 2023-04-22 DIAGNOSIS — Z79899 Other long term (current) drug therapy: Secondary | ICD-10-CM | POA: Diagnosis present

## 2023-04-22 DIAGNOSIS — I714 Abdominal aortic aneurysm, without rupture, unspecified: Secondary | ICD-10-CM | POA: Diagnosis present

## 2023-04-22 MED ORDER — SODIUM CHLORIDE (PF) 0.9 % IJ SOLN
INTRAMUSCULAR | Status: AC
  Filled 2023-04-22: qty 50

## 2023-04-22 MED ORDER — IOHEXOL 350 MG/ML SOLN
75.0000 mL | Freq: Once | INTRAVENOUS | Status: AC | PRN
  Administered 2023-04-22: 75 mL via INTRAVENOUS

## 2023-04-22 MED ORDER — IOHEXOL 300 MG/ML  SOLN: 100.0000 mL | Freq: Once | INTRAMUSCULAR | Status: DC | PRN

## 2023-04-30 ENCOUNTER — Other Ambulatory Visit: Payer: Self-pay | Admitting: Physician Assistant

## 2023-04-30 ENCOUNTER — Other Ambulatory Visit: Payer: Self-pay

## 2023-04-30 ENCOUNTER — Other Ambulatory Visit: Payer: Self-pay | Admitting: Nurse Practitioner

## 2023-04-30 DIAGNOSIS — G8929 Other chronic pain: Secondary | ICD-10-CM

## 2023-04-30 MED ORDER — LOSARTAN POTASSIUM 50 MG PO TABS
50.0000 mg | ORAL_TABLET | Freq: Every day | ORAL | 3 refills | Status: DC
  Filled 2023-04-30: qty 90, 90d supply, fill #0
  Filled 2023-08-05: qty 90, 90d supply, fill #1
  Filled 2023-11-08: qty 90, 90d supply, fill #2
  Filled 2024-02-03: qty 90, 90d supply, fill #3

## 2023-05-06 ENCOUNTER — Other Ambulatory Visit: Payer: Self-pay

## 2023-05-06 ENCOUNTER — Ambulatory Visit: Payer: Medicaid Other | Admitting: Internal Medicine

## 2023-05-06 ENCOUNTER — Telehealth: Payer: Self-pay

## 2023-05-06 ENCOUNTER — Encounter: Payer: Self-pay | Admitting: Internal Medicine

## 2023-05-06 VITALS — BP 124/80 | HR 68 | Ht 75.0 in | Wt 319.5 lb

## 2023-05-06 DIAGNOSIS — F17210 Nicotine dependence, cigarettes, uncomplicated: Secondary | ICD-10-CM

## 2023-05-06 DIAGNOSIS — K625 Hemorrhage of anus and rectum: Secondary | ICD-10-CM

## 2023-05-06 DIAGNOSIS — Z7902 Long term (current) use of antithrombotics/antiplatelets: Secondary | ICD-10-CM

## 2023-05-06 DIAGNOSIS — R1084 Generalized abdominal pain: Secondary | ICD-10-CM

## 2023-05-06 DIAGNOSIS — E118 Type 2 diabetes mellitus with unspecified complications: Secondary | ICD-10-CM

## 2023-05-06 MED ORDER — NA SULFATE-K SULFATE-MG SULF 17.5-3.13-1.6 GM/177ML PO SOLN
1.0000 | ORAL | 0 refills | Status: DC
  Filled 2023-05-06: qty 354, 2d supply, fill #0

## 2023-05-06 NOTE — Telephone Encounter (Signed)
[facility] Pre-operative Risk Assessment     Request for surgical clearance:     Endoscopy Procedure  What type of surgery is being performed?     colonoscopy  When is this surgery scheduled?     05/27/2023  What type of clearance is required ?   Pharmacy  Are there any medications that need to be held prior to surgery and how long? Plavix - 5 days  Practice name and name of physician performing surgery?      [facility]  What is your office phone and fax number?      Phone- [phone]  Fax- [phone]  Anesthesia type (None, local, MAC, general) ?       MAC   Please route your response to [facility]

## 2023-05-06 NOTE — Telephone Encounter (Signed)
Patient advised understanding. 

## 2023-05-06 NOTE — Patient Instructions (Signed)
You have been scheduled for a colonoscopy. Please follow written instructions given to you at your visit today.   Please pick up your prep supplies at the pharmacy within the next 1-3 days.  If you use inhalers (even only as needed), please bring them with you on the day of your procedure.  DO NOT TAKE 7 DAYS PRIOR TO TEST- Trulicity (dulaglutide) Ozempic, Wegovy (semaglutide) Mounjaro (tirzepatide) Bydureon Bcise (exanatide extended release)  DO NOT TAKE 1 DAY PRIOR TO YOUR TEST Rybelsus (semaglutide) Adlyxin (lixisenatide) Victoza (liraglutide) Byetta (exanatide) ___________________________________________________________________________  _______________________________________________________  If your blood pressure at your visit was 140/90 or greater, please contact your primary care physician to follow up on this.  _______________________________________________________  If you are age 78 or older, your body mass index should be between 23-30. Your Body mass index is 39.93 kg/m. If this is out of the aforementioned range listed, please consider follow up with your Primary Care Provider.  If you are age 63 or younger, your body mass index should be between 19-25. Your Body mass index is 39.93 kg/m. If this is out of the aformentioned range listed, please consider follow up with your Primary Care Provider.   ________________________________________________________  The [facility] providers would like to encourage you to use [facility] to communicate with providers for non-urgent requests or questions.  Due to long hold times on the telephone, sending your provider a message by [facility] may be a faster and more efficient way to get a response.  Please allow 48 business hours for a response.  Please remember that this is for non-urgent requests.  _______________________________________________________

## 2023-05-06 NOTE — Telephone Encounter (Signed)
Patient understands holding Plavix

## 2023-05-06 NOTE — Telephone Encounter (Signed)
   Patient Name: William Young  DOB: 1960-12-20 MRN: [id number]  Primary Cardiologist: Dietrich Pates, MD  Patient's past medical history, labs, and current medications reviewed as part of preoperative protocol coverage. The following recommendations have been made: Patient can hold Plavix for 5 days prior to colonoscopy, please resume when safe to do so from a bleeding standpoint.   I will route this recommendation to the requesting party via Epic fax function and remove from pre-op pool.  Please call with questions.  Rip Harbour, NP 05/06/2023, 12:19 PM

## 2023-05-06 NOTE — Telephone Encounter (Signed)
Lm on vm that per cardiology, patient could hold his Plavix for 5 days prior to his procedure.  Asked for him to call back and let me know he got this information.

## 2023-05-13 ENCOUNTER — Encounter: Payer: Self-pay | Admitting: Internal Medicine

## 2023-05-13 ENCOUNTER — Ambulatory Visit: Payer: Medicaid Other | Admitting: Physician Assistant

## 2023-05-14 ENCOUNTER — Other Ambulatory Visit: Payer: Self-pay | Admitting: Nurse Practitioner

## 2023-05-14 DIAGNOSIS — G8929 Other chronic pain: Secondary | ICD-10-CM

## 2023-05-14 IMAGING — CT CT L SPINE W/O CM
3 series · 12 of 33 positions shown, 14 images · non-contrast
Comparison: CT 05/18/2020

CLINICAL DATA: Right low pain, right-sided sciatic, onset
05/14/2020

EXAM:
CT LUMBAR SPINE WITHOUT CONTRAST
TECHNIQUE: Multidetector CT imaging of the lumbar spine was performed without
intravenous contrast administration. Multiplanar CT image
reconstructions were also generated.

[Series 4: l spine soft · axial · 0.48mm/px · z∈[-449,-275]mm · 4 of 127 slices shown, 5 images]
[im 20/127  soft-tissue]
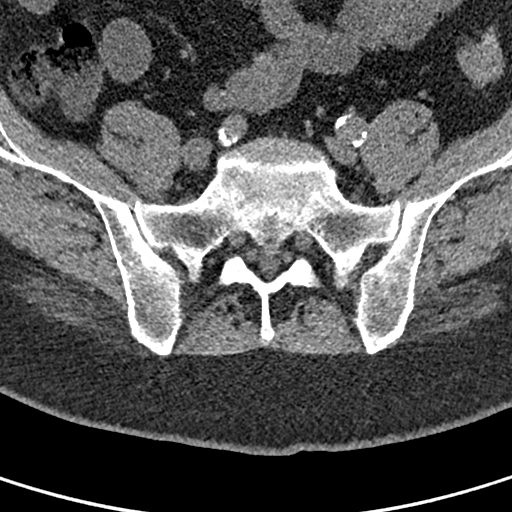
[im 20/127  bone]
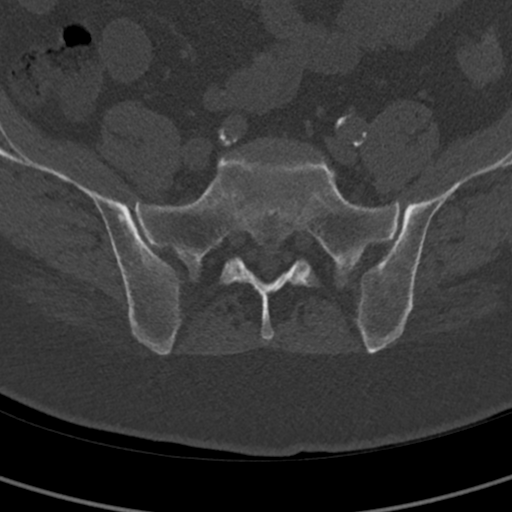
[im 49/127  bone]
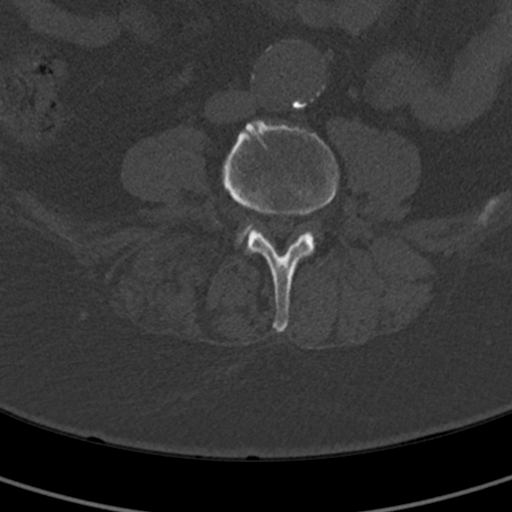
[im 78/127  bone]
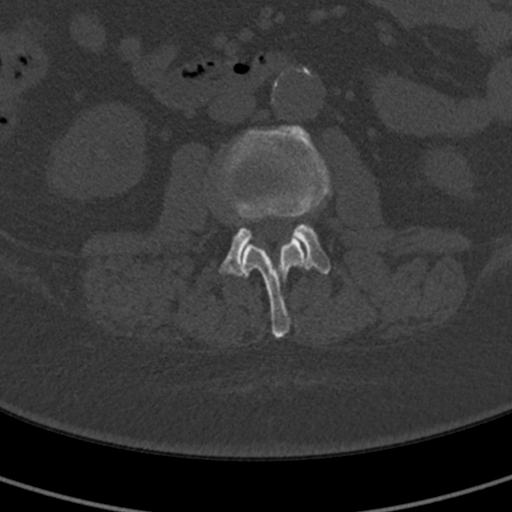
[im 107/127  bone]
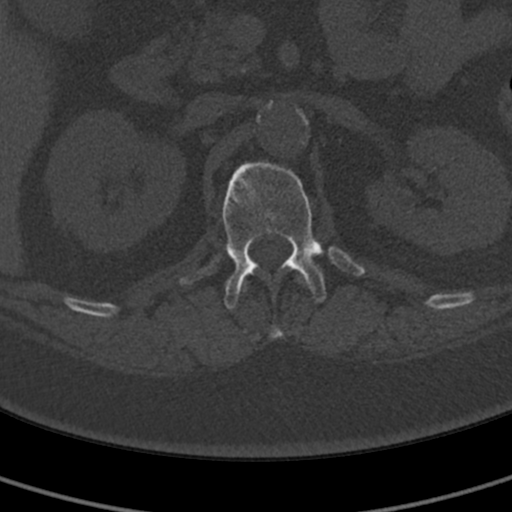

[Series 6: coronal bone · coronal · 0.37mm/px · 3 of 76 slices shown]
[im 16/76  bone]
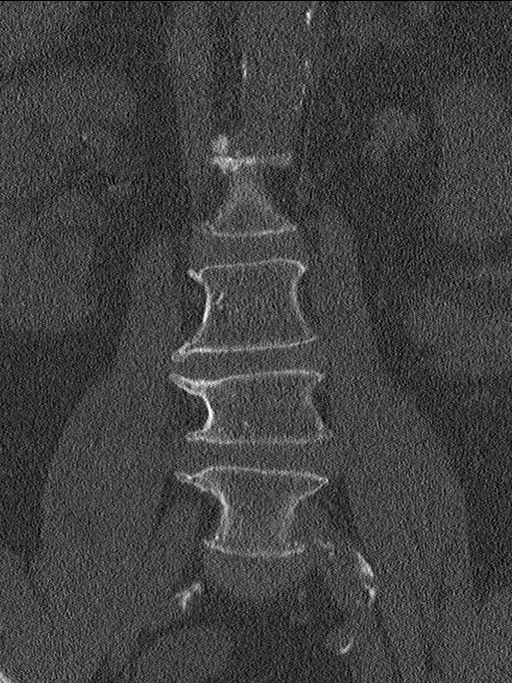
[im 31/76  bone]
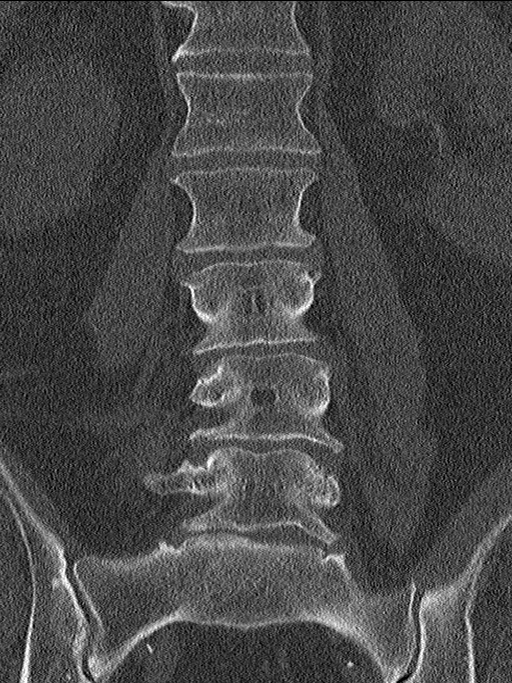
[im 46/76  bone]
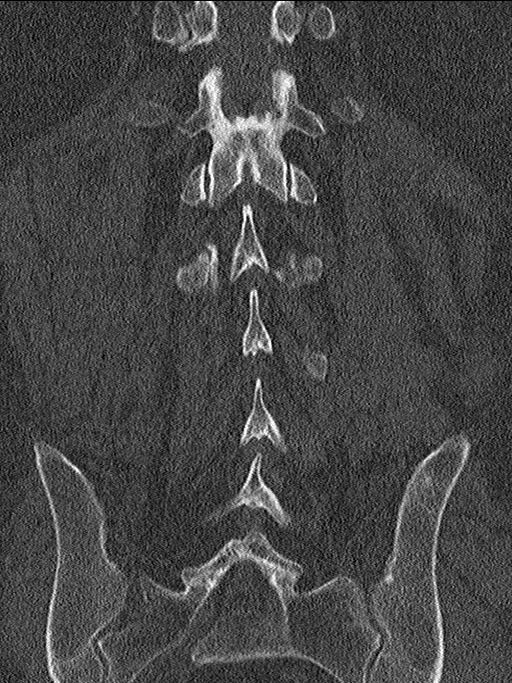

[Series 7: sagittal bone · sagittal · 0.37mm/px · 5 of 80 slices shown, 6 images]
[im 27/80  bone]
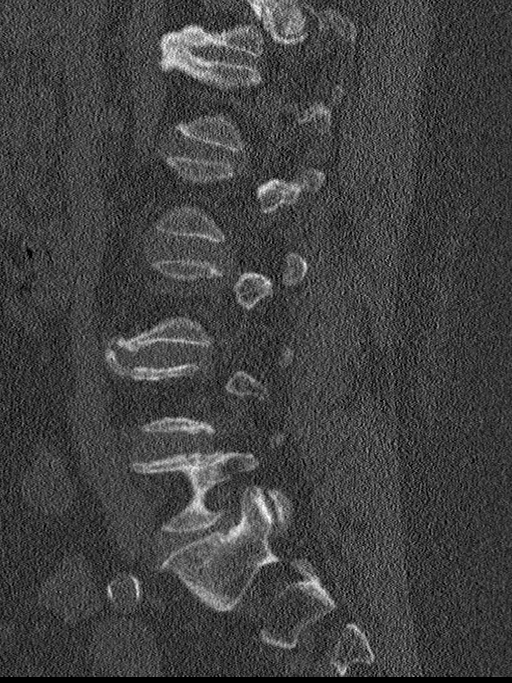
[im 33/80  bone]
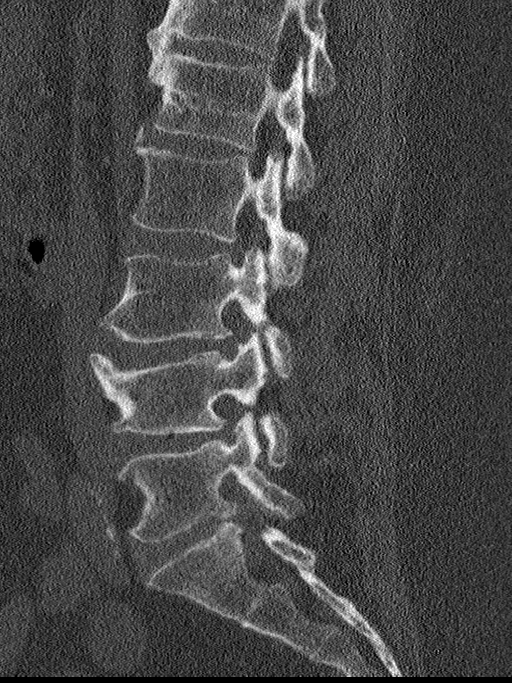
[im 40/80  soft-tissue]
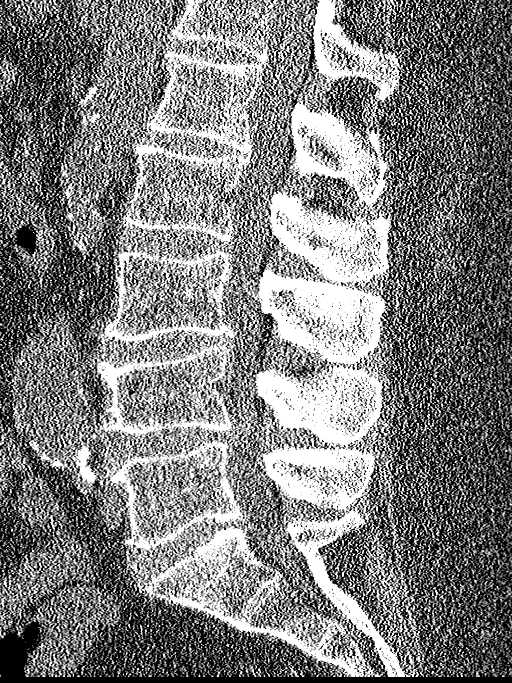
[im 40/80  bone]
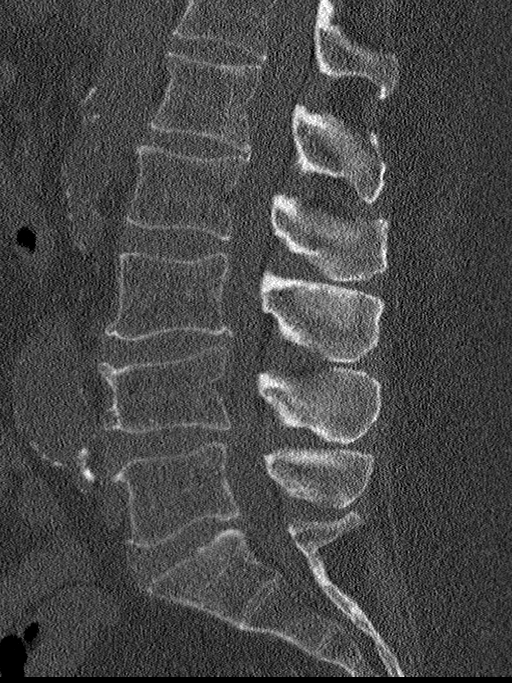
[im 47/80  bone]
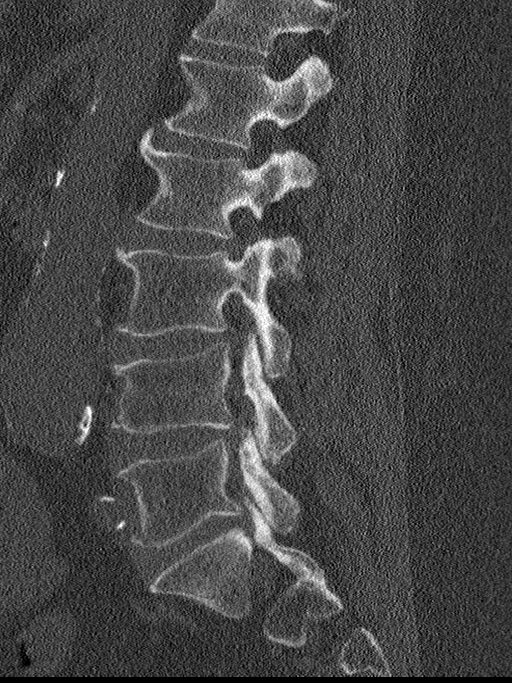
[im 53/80  bone]
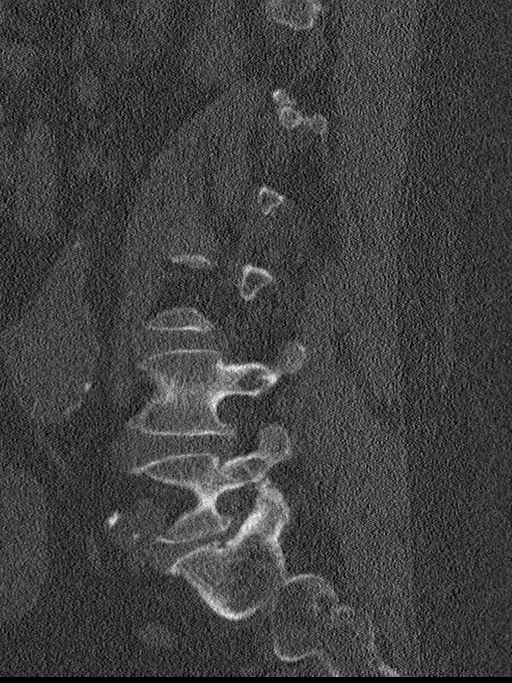

[12 of 33 positions shown; findings below may reference images not displayed]

FINDINGS: Segmentation: Utilizing prior imaging, there are 12 normally formed
thoracic ribs. Rudimentary ribs versus unfused transverse processes
are seen at the presumed L1 level with lowest fully formed disc
space denoted as L5-S1.

Alignment: Mild levocurvature of the lumbar spine with an apex at
L4. No significant spondylolisthesis or spondylolysis. No abnormally
widened, jumped or perched facets.

Vertebrae: No acute vertebral body fracture or height loss. Some
remote anterior wedging is noted at L4 with up to 15% height loss
anteriorly. No acute vertebral body fracture or height loss is seen.
Variant transverse processes at the L1 level, as above. No acute
vertebral body fracture or height loss. No suspicious lytic or
blastic lesions. Normal bone mineralization. Multilevel discogenic
and facet degenerative changes as detailed below. Congenitally
shortened pedicles may result in accentuation of canal impingement.

Paraspinal and other soft tissues: No paravertebral fluid, swelling,
gas or hemorrhage. No visible canal hematoma or other gross
abnormality of the canal within the limitations of this unenhanced
exam. Included portions of the posterior abdomen and pelvis reveal
an infrarenal abdominal aortic aneurysm measuring up to 3.6 cm in
size. Aortoiliac atherosclerosis as well as additional plaque in the
branch vessels. Fluid attenuation cyst seen right kidney measuring
up to 2.9 cm in size. Scattered colonic diverticula without focal
inflammation to suggest diverticulitis. No other acute or
conspicuous abnormality in the included portions of posterior
abdomen and pelvis.

Disc levels:

Level by level evaluation of the lumbar spine below:

T12-L1: Mild disc height loss and facet arthropathy. No significant
spinal canal or foraminal stenosis.

L1-L2: Mild disc height loss and facet arthropathy with small
central-right central disc protrusion. Mild facet arthropathy. Mild
resulting canal stenosis. No significant foraminal narrowing.

L2-L3: Disc height loss and global disc bulge mild bilateral facet
arthropathy. Mild canal stenosis and bilateral foraminal narrowing.

L3-L4: Disc height loss and global disc bulge with bilateral facet
arthropathy. Mild canal stenosis and bilateral foraminal narrowing.

L4-L5: Disc height loss and global disc bulge with a superimposed
left central disc protrusion and bilateral facet arthropathy with
ligamentum flavum infolding. Combination of features result in
moderate to severe canal stenosis and moderate bilateral foraminal
narrowing.

L5-S1: Disc height loss and global disc bulge. Bilateral facet
arthropathy, left greater than right, with ligamentum flavum
infolding. Mild canal stenosis and moderate bilateral foraminal
narrowing.
IMPRESSION: 1. No acute vertebral body fracture or height loss.
2. Transitional L1 level with unfused transverse processes versus
rudimentary ribs.
3. Remote appearing anterior wedging of L4 with 15% height loss
anteriorly.
4. Levocurvature of the lumbar spine, apex L4.
5. Multilevel discogenic and facet degenerative changes, maximal
L4-5, with moderate to severe canal stenosis and moderate bilateral
foraminal narrowing. Additional changes detailed level by level
above.
6.  Aortic Atherosclerosis (JPJC9-EP4.4).
7. Infrarenal abdominal aortic aneurysm measuring up to 3.6 cm in
diameter Recommend follow-up ultrasound every 2 years. This
recommendation follows ACR consensus guidelines: White Paper of the
ACR Incidental Findings Committee II on Vascular Findings. [HOSPITAL] 3810; [DATE].
8. Scattered colonic diverticula without focal inflammation to
suggest diverticulitis.

## 2023-05-16 MED ORDER — ACETAMINOPHEN-CODEINE 300-30 MG PO TABS
1.0000 | ORAL_TABLET | Freq: Every day | ORAL | 0 refills | Status: DC | PRN
  Filled 2023-05-16: qty 30, 30d supply, fill #0

## 2023-05-17 ENCOUNTER — Other Ambulatory Visit: Payer: Self-pay

## 2023-05-19 LAB — HM DIABETES EYE EXAM

## 2023-05-27 ENCOUNTER — Ambulatory Visit: Payer: Medicaid Other | Admitting: Internal Medicine

## 2023-05-27 ENCOUNTER — Encounter: Payer: Self-pay | Admitting: Internal Medicine

## 2023-05-27 VITALS — BP 137/80 | HR 63 | Temp 97.9°F | Resp 19 | Ht 75.0 in | Wt 319.0 lb

## 2023-05-27 DIAGNOSIS — Z1211 Encounter for screening for malignant neoplasm of colon: Secondary | ICD-10-CM

## 2023-05-27 DIAGNOSIS — D124 Benign neoplasm of descending colon: Secondary | ICD-10-CM

## 2023-05-27 DIAGNOSIS — D125 Benign neoplasm of sigmoid colon: Secondary | ICD-10-CM

## 2023-05-27 DIAGNOSIS — D123 Benign neoplasm of transverse colon: Secondary | ICD-10-CM

## 2023-05-27 DIAGNOSIS — D122 Benign neoplasm of ascending colon: Secondary | ICD-10-CM | POA: Diagnosis not present

## 2023-05-27 DIAGNOSIS — K6389 Other specified diseases of intestine: Secondary | ICD-10-CM | POA: Diagnosis not present

## 2023-05-27 DIAGNOSIS — K648 Other hemorrhoids: Secondary | ICD-10-CM

## 2023-05-27 DIAGNOSIS — K625 Hemorrhage of anus and rectum: Secondary | ICD-10-CM

## 2023-05-27 DIAGNOSIS — K5730 Diverticulosis of large intestine without perforation or abscess without bleeding: Secondary | ICD-10-CM

## 2023-05-27 DIAGNOSIS — D120 Benign neoplasm of cecum: Secondary | ICD-10-CM

## 2023-05-27 DIAGNOSIS — K635 Polyp of colon: Secondary | ICD-10-CM | POA: Diagnosis not present

## 2023-05-27 DIAGNOSIS — R1084 Generalized abdominal pain: Secondary | ICD-10-CM

## 2023-05-27 MED ORDER — SODIUM CHLORIDE 0.9 % IV SOLN: 500.0000 mL | INTRAVENOUS | Status: DC

## 2023-05-27 NOTE — Progress Notes (Signed)
Expand All Collapse All HISTORY OF PRESENT ILLNESS:   William Young is a 63 y.o. male with with multiple significant medical problems as listed below including coronary artery disease for which she is undergone remote stent placement and is on Plavix, type 2 diabetes mellitus on multiple agents, hypertension, obesity with sleep apnea, and GERD.  He is sent today by his primary care provider regarding abdominal pain, rectal bleeding, and the need for colonoscopy.  Patient denies having had colonoscopy previously.  He is accompanied by his significant other.   The patient went to the emergency department January 20, 2023 complaining of abdominal pain and intermittent rectal bleeding.  At that time his stool was tested for occult blood and returned NEGATIVE.  Comprehensive metabolic panel at that time was normal except for mild elevation of glucose at 136.  CBC was normal with hemoglobin 14.5.  He did undergo a CT scan of the abdomen and pelvis with contrast.  Examination revealed sigmoid diverticulosis without inflammation as well as several mildly distended fluid loops of jejunum with slight thickening.  Was also noted to have an infrarenal abdominal aortic aneurysm measuring 4.4 cm.  16-month follow-up recommended.   Since that time his issues with pain and suspected bleeding have resolved.  He has daily bowel movements.  His chronic medical problems have been stable.  His last ejection fraction evaluation in May 2023 was found to be 50 to 55%.  His cardiologist is Dr. Tenny Craw.   REVIEW OF SYSTEMS:   All non-GI ROS negative unless otherwise stated in the HPI except for arthritis, back pain, muscle cramps, sleeping problems, lower extremity edema       Past Medical History:  Diagnosis Date   Abdominal aortic aneurysm (AAA) (HCC)      CT 1/22: infrarenal 3.8 cm - repeat US in 2 years // AAA [place] 2/22: AAA 3.8 cm; aorto iliac atherosclerosis without hemodynamically significant stenosis   Aortic  atherosclerosis (HCC)      CT in 03/2020   CAD (coronary artery disease) 09/2014    a. NSTEMI >> LHC 5/16:  mLAD 15, pRCA 90, mRCA 40, RPLB3 80, EF normal with inf HK >> PCI:  BMS to pRCA and DES to RPLB3 // Cath 11/21: Patent RCA stent, patent RPL stent; medical therapy    GERD (gastroesophageal reflux disease)     History of echocardiogram      Echo 6/16:  EF 50-55%, no RWMA, Gr 1 DD   HLD (hyperlipidemia)      Hattie Perch 03/15/2017   HTN (hypertension)     Hx of NSTEMI 5/16 tx with BMS to pRCA and DES to RPLB3 09/10/2014   Hyperglycemia     OSA (obstructive sleep apnea)      "dx'd; couldn't tolerate mask" (03/15/2017)   PAD (peripheral artery disease) (HCC)      AAA [place] in 06/2020: aorto iliac atherosclerosis   Tobacco use     Type II diabetes mellitus (HCC)     Varicose veins of both lower extremities      S/P ablation 02/2017               Past Surgical History:  Procedure Laterality Date   CARDIAC CATHETERIZATION N/A 09/11/2014    Procedure: Left Heart Cath and Coronary Angiography;  Surgeon: Marykay Lex, MD;  Location: [facility] INVASIVE CV LAB [facility]UP[facility];  Service: Cardiovascular;  Laterality: N/A;   CARDIAC CATHETERIZATION   09/11/2014    Procedure: Coronary Stent Intervention;  Surgeon: Piedad Climes  Herbie Baltimore, MD;  Location: [facility] INVASIVE CV LAB CUPID;  Service: Cardiovascular;;   ENDOVENOUS ABLATION SAPHENOUS VEIN W/ LASER Right 02/22/2017    endovenous laser ablation R GSV and stab phlebectomy >20 incisions R leg by Josephina Gip MD    LEFT HEART CATH AND CORONARY ANGIOGRAPHY N/A 04/01/2020    Procedure: LEFT HEART CATH AND CORONARY ANGIOGRAPHY;  Surgeon: Runell Gess, MD;  Location: [facility] INVASIVE CV LAB;  Service: Cardiovascular;  Laterality: N/A;          Social History Cordie Marotz  reports that he has been smoking cigarettes. He started smoking about 47 years ago. He has a 47 pack-year smoking history. He has quit using smokeless tobacco.  His smokeless tobacco use included chew. He  reports current alcohol use of about 6.0 standard drinks of alcohol per week. He reports that he does not use drugs.   family history includes Cancer in his maternal grandfather; Heart attack in his maternal grandmother, maternal uncle, and paternal grandmother.   Allergies       Allergies  Allergen Reactions   Aspirin Anaphylaxis, Swelling and Rash      Throat closes    Ibuprofen Anaphylaxis, Swelling and Rash            PHYSICAL EXAMINATION: Vital signs: BP 124/80 (BP Location: Left Arm, Patient Position: Sitting, Cuff Size: Large)   Pulse 68   Ht 6\' 3"  (1.905 m)   Wt (!) 319 lb 8 oz (144.9 kg)   SpO2 97%   BMI 39.93 kg/m   Constitutional: Chronically ill-appearing, no acute distress Psychiatric: alert and oriented x3, cooperative Eyes: extraocular movements intact, anicteric, conjunctiva pink Mouth: oral pharynx moist, no lesions, poor dentition Neck: supple no lymphadenopathy Cardiovascular: heart regular rate and rhythm, no murmur Lungs: clear to auscultation bilaterally Abdomen: soft, obese, nontender, nondistended, no obvious ascites, no peritoneal signs, normal bowel sounds, no organomegaly Rectal: Deferred until colonoscopy Extremities: no clubbing or cyanosis.  1+ lower extremity edema bilaterally Skin: no relevant lesions on visible extremities Neuro: No focal deficits.  Cranial nerves intact   ASSESSMENT:   1.  Previous problems with abdominal pain and reported rectal bleeding (Hemoccult negative in ER) 2.  Screening colonoscopy.  The patient has not had 1. 3.  Multiple significant medical problems as outlined, including a remote history of coronary artery stenting for which he is on Plavix     PLAN:   1.  Colonoscopy.  The patient is higher than baseline risk due to his body habitus, comorbidities, and the need to address several medications preprocedure.The nature of the procedure, as well as the risks, benefits, and alternatives were carefully and  thoroughly reviewed with the patient. Ample time for discussion and questions allowed. The patient understood, was satisfied, and agreed to proceed. 2.  Hold Plavix 5 to 7 days prior to colonoscopy.  Will check with his cardiology Dr. Tenny Craw to see if this is excepted. 3.  Hold diabetic medications day of procedure to avoid unwanted hypoglycemia.  He is aware  Recent H&P as above.  Should be noted that the patient only held his Plavix for 2 days.  We will proceed with limitations for advanced lesions.

## 2023-05-27 NOTE — Patient Instructions (Signed)
-   Resume Plavix (clopidogrel) tomorrow at prior dose. - Resume previous diet - Continue present medications. - Await pathology results   YOU HAD AN ENDOSCOPIC PROCEDURE TODAY AT THE [facility]:   Refer to the procedure report that was given to you for any specific questions about what was found during the examination.  If the procedure report does not answer your questions, please call your gastroenterologist to clarify.  If you requested that your care partner not be given the details of your procedure findings, then the procedure report has been included in a sealed envelope for you to review at your convenience later.  YOU SHOULD EXPECT: Some feelings of bloating in the abdomen. Passage of more gas than usual.  Walking can help get rid of the air that was put into your GI tract during the procedure and reduce the bloating. If you had a lower endoscopy (such as a colonoscopy or flexible sigmoidoscopy) you may notice spotting of blood in your stool or on the toilet paper. If you underwent a bowel prep for your procedure, you may not have a normal bowel movement for a few days.  Please Note:  You might notice some irritation and congestion in your nose or some drainage.  This is from the oxygen used during your procedure.  There is no need for concern and it should clear up in a day or so.  SYMPTOMS TO REPORT IMMEDIATELY:  Following lower endoscopy (colonoscopy or flexible sigmoidoscopy):  Excessive amounts of blood in the stool  Significant tenderness or worsening of abdominal pains  Swelling of the abdomen that is new, acute  Fever of 100F or higher  For urgent or emergent issues, a gastroenterologist can be reached at any hour by calling [phone]. Do not use MyChart messaging for urgent concerns.    DIET:  We do recommend a small meal at first, but then you may proceed to your regular diet.  Drink plenty of fluids but you should avoid alcoholic beverages for 24  hours.  ACTIVITY:  You should plan to take it easy for the rest of today and you should NOT DRIVE or use heavy machinery until tomorrow (because of the sedation medicines used during the test).    FOLLOW UP: Our staff will call the number listed on your records the next business day following your procedure.  We will call around 7:15- 8:00 am to check on you and address any questions or concerns that you may have regarding the information given to you following your procedure. If we do not reach you, we will leave a message.     If any biopsies were taken you will be contacted by phone or by letter within the next 1-3 weeks.  Please call us at [phone] if you have not heard about the biopsies in 3 weeks.    SIGNATURES/CONFIDENTIALITY: You and/or your care partner have signed paperwork which will be entered into your electronic medical record.  These signatures attest to the fact that that the information above on your After Visit Summary has been reviewed and is understood.  Full responsibility of the confidentiality of this discharge information lies with you and/or your care-partner.

## 2023-05-27 NOTE — Op Note (Signed)
[facility] Patient Name: William Young Procedure Date: 05/27/2023 1:21 PM MRN: [id number] Endoscopist: Wilhemina Bonito. Marina Goodell , MD, [id number] Age: 63 Referring MD:  Date of Birth: January 14, 1961 Gender: Male Account #: [id number] Procedure:                Colonoscopy with cold snare polypectomy x 6;biopsy                            polypectomy x 1;Endo Clip x 1 Indications:              Screening for colorectal malignant neoplasm Medicines:                Monitored Anesthesia Care Procedure:                Pre-Anesthesia Assessment:                           - Prior to the procedure, a History and Physical                            was performed, and patient medications and                            allergies were reviewed. The patient's tolerance of                            previous anesthesia was also reviewed. The risks                            and benefits of the procedure and the sedation                            options and risks were discussed with the patient.                            All questions were answered, and informed consent                            was obtained. Prior Anticoagulants: The patient has                            taken Plavix (clopidogrel), last dose was 2 days                            prior to procedure. ASA Grade Assessment: III - A                            patient with severe systemic disease. After                            reviewing the risks and benefits, the patient was                            deemed in satisfactory condition to undergo the  procedure.                           After obtaining informed consent, the colonoscope                            was passed under direct vision. Throughout the                            procedure, the patient's blood pressure, pulse, and                            oxygen saturations were monitored continuously. The                            Olympus Scope SN:  [id number] was introduced through                            the anus and advanced to the the cecum, identified                            by appendiceal orifice and ileocecal valve. The                            ileocecal valve, appendiceal orifice, and rectum                            were photographed. The quality of the bowel                            preparation was excellent. The colonoscopy was                            performed without difficulty. The patient tolerated                            the procedure well. The bowel preparation used was                            SUPREP via single dose instruction. The procedure                            was determined to be ASGE Complexity Level 1. Scope In: 1:47:08 PM Scope Out: 2:10:16 PM Scope Withdrawal Time: 0 hours 19 minutes 41 seconds  Total Procedure Duration: 0 hours 23 minutes 8 seconds  Findings:                 Six polyps were found in the sigmoid colon,                            descending colon, transverse colon, ascending colon                            and cecum. The polyps were 3 to 8 mm in size.  These                            polyps were removed with a cold snare. Resection                            and retrieval were complete. There was low-grade                            but persistent oozing from a descending colon                            pedunculated polyp that was removed with cold                            snare. The base was treated with endoscopic clip                            with complete hemostasis.                           A 1 mm polyp was found in the ascending colon. The                            polyp was removed with a jumbo cold forceps.                            Resection and retrieval were complete.                           Many diverticula were found in the cecum, left                            colon and right colon.                           Internal hemorrhoids were found  during retroflexion.                           The exam was otherwise without abnormality on                            direct and retroflexion views. Complications:            No immediate complications. Estimated blood loss:                            None. Estimated Blood Loss:     Estimated blood loss: none. Recommendation:           - Repeat colonoscopy in 3 years for surveillance.                           - Resume Plavix (clopidogrel) tomorrow at prior  dose.                           - Patient has a contact number available for                            emergencies. The signs and symptoms of potential                            delayed complications were discussed with the                            patient. Return to normal activities tomorrow.                            Written discharge instructions were provided to the                            patient.                           - Resume previous diet.                           - Continue present medications.                           - Await pathology results. Wilhemina Bonito. Marina Goodell, MD 05/27/2023 2:21:36 PM This report has been signed electronically.

## 2023-05-27 NOTE — Progress Notes (Signed)
Pt was instructed to hold Plavix for  day.Pt took Plavix on 05/25/2023.Dr. Marina Goodell informed.

## 2023-05-27 NOTE — Progress Notes (Signed)
Sedate, gd SR, tolerated procedure well, VSS, report to RN 

## 2023-05-27 NOTE — Progress Notes (Signed)
Called to room to assist during endoscopic procedure.  Patient ID and intended procedure confirmed with present staff. Received instructions for my participation in the procedure from the performing physician.  

## 2023-05-28 ENCOUNTER — Telehealth: Payer: Self-pay

## 2023-05-28 NOTE — Telephone Encounter (Signed)
  Follow up Call-     05/27/2023    1:30 PM  Call back number  Post procedure Call Back phone  # [phone]  Permission to leave phone message Yes     Patient questions:  Do you have a fever, pain , or abdominal swelling? No. Pain Score  0 *  Have you tolerated food without any problems? Yes.    Have you been able to return to your normal activities? Yes.    Do you have any questions about your discharge instructions: Diet   No. Medications  No. Follow up visit  No.  Do you have questions or concerns about your Care? No.  Actions: * If pain score is 4 or above: No action needed, pain <4.

## 2023-06-01 ENCOUNTER — Other Ambulatory Visit: Payer: Self-pay

## 2023-06-01 ENCOUNTER — Encounter: Payer: Self-pay | Admitting: Internal Medicine

## 2023-06-01 ENCOUNTER — Ambulatory Visit: Payer: Medicaid Other | Attending: Nurse Practitioner | Admitting: Nurse Practitioner

## 2023-06-01 ENCOUNTER — Encounter: Payer: Self-pay | Admitting: Nurse Practitioner

## 2023-06-01 VITALS — BP 162/96 | HR 75 | Wt 313.6 lb

## 2023-06-01 DIAGNOSIS — I10 Essential (primary) hypertension: Secondary | ICD-10-CM

## 2023-06-01 DIAGNOSIS — M25511 Pain in right shoulder: Secondary | ICD-10-CM

## 2023-06-01 DIAGNOSIS — M25521 Pain in right elbow: Secondary | ICD-10-CM | POA: Diagnosis not present

## 2023-06-01 DIAGNOSIS — E118 Type 2 diabetes mellitus with unspecified complications: Secondary | ICD-10-CM | POA: Diagnosis not present

## 2023-06-01 DIAGNOSIS — Z23 Encounter for immunization: Secondary | ICD-10-CM | POA: Diagnosis not present

## 2023-06-01 DIAGNOSIS — Z7984 Long term (current) use of oral hypoglycemic drugs: Secondary | ICD-10-CM

## 2023-06-01 LAB — POCT GLYCOSYLATED HEMOGLOBIN (HGB A1C): HbA1c, POC (controlled diabetic range): 6.8 % (ref 0.0–7.0)

## 2023-06-01 LAB — SURGICAL PATHOLOGY

## 2023-06-01 LAB — GLUCOSE, POCT (MANUAL RESULT ENTRY): POC Glucose: 132 mg/dL — AB (ref 70–99)

## 2023-06-01 NOTE — Progress Notes (Signed)
William Young was seen today for medical management of chronic issues.  Diagnoses and all orders for this visit:  Right elbow pain Continue copper sleeve Follow up with Ortho for possible tendinitis XS tylenol and voltaren gel for pain  Acute pain of right shoulder Avoid sleeping on right shoulder  Type 2 diabetes mellitus with complication, without long-term current use of insulin (HCC) -     POCT glycosylated hemoglobin (Hb A1C) -     POCT glucose (manual entry) -     Microalbumin / creatinine urine ratio  Primary hypertension Continue all antihypertensives as prescribed.  Reminded to bring in blood pressure log for follow  up appointment.  RECOMMENDATIONS: DASH/Mediterranean Diets are healthier choices for HTN.    Need for Streptococcus pneumoniae vaccination -     Pneumococcal conjugate vaccine 20-valent  Need for shingles vaccine -     Varicella-zoster vaccine IM    Subjective:   Chief Complaint  Patient presents with   Medical Management of Chronic Issues    Patient in shoulder and elbow     William Young 63 y.o. male presents to office today with pain in shoulder and elbow.   He has a past medical history of Abdominal aortic aneurysm, Aortic atherosclerosis, CAD (09/2014), GERD, History of echocardiogram, HLD, HTN, Hx of NSTEMI 5/16 tx with BMS to [facility] and DES to RPLB3 (09/10/2014), Hyperglycemia, OSA, Pulmonary nodules, COPD, PAD, Tobacco use, Type II diabetes mellitus, and Varicose veins of both lower extremities.    Followed by Cardiology and Pulmonology  Notes chronic right elbow pain that "comes and goes". He also has right shoulder pain that is not associated with the elbow pain.  He does endorse favoring his right side for sleep. He has been using a copper sleeve for his elbow with no relief. Associated elbow symptoms: swelling. Aggravating factors: lifting, reaching, extension of the right arm.  Denies any injury or trauma.    Review of Systems   Constitutional:  Negative for fever, malaise/fatigue and weight loss.  HENT: Negative.  Negative for nosebleeds.   Eyes: Negative.  Negative for blurred vision, double vision and photophobia.  Respiratory: Negative.  Negative for cough and shortness of breath.   Cardiovascular: Negative.  Negative for chest pain, palpitations and leg swelling.  Gastrointestinal: Negative.  Negative for heartburn, nausea and vomiting.  Musculoskeletal:  Positive for joint pain. Negative for myalgias.  Neurological: Negative.  Negative for dizziness, focal weakness, seizures and headaches.  Psychiatric/Behavioral: Negative.  Negative for suicidal ideas.     Past Medical History:  Diagnosis Date   Abdominal aortic aneurysm (AAA) (HCC)    CT 1/22: infrarenal 3.8 cm - repeat US in 2 years // AAA [place] 2/22: AAA 3.8 cm; aorto iliac atherosclerosis without hemodynamically significant stenosis   Aortic atherosclerosis (HCC)    CT in 03/2020   CAD (coronary artery disease) 09/2014   a. NSTEMI >> LHC 5/16:  mLAD 15, pRCA 90, mRCA 40, RPLB3 80, EF normal with inf HK >> PCI:  BMS to pRCA and DES to RPLB3 // Cath 11/21: Patent RCA stent, patent RPL stent; medical therapy    GERD (gastroesophageal reflux disease)    History of echocardiogram    Echo 6/16:  EF 50-55%, no RWMA, Gr 1 DD   HLD (hyperlipidemia)    Hattie Perch 03/15/2017   HTN (hypertension)    Hx of NSTEMI 5/16 tx with BMS to pRCA and DES to RPLB3 09/10/2014   Hyperglycemia    OSA (obstructive sleep  apnea)    "dx'd; couldn't tolerate mask" (03/15/2017)   PAD (peripheral artery disease) (HCC)    AAA [place] in 06/2020: aorto iliac atherosclerosis   Tobacco use    Type II diabetes mellitus (HCC)    Varicose veins of both lower extremities    S/P ablation 02/2017    Past Surgical History:  Procedure Laterality Date   CARDIAC CATHETERIZATION N/A 09/11/2014   Procedure: Left Heart Cath and Coronary Angiography;  Surgeon: Marykay Lex, MD;  Location: [facility] INVASIVE  CV LAB CUPID;  Service: Cardiovascular;  Laterality: N/A;   CARDIAC CATHETERIZATION  09/11/2014   Procedure: Coronary Stent Intervention;  Surgeon: Marykay Lex, MD;  Location: [facility] INVASIVE CV LAB CUPID;  Service: Cardiovascular;;   ENDOVENOUS ABLATION SAPHENOUS VEIN W/ LASER Right 02/22/2017   endovenous laser ablation R GSV and stab phlebectomy >20 incisions R leg by Josephina Gip MD    LEFT HEART CATH AND CORONARY ANGIOGRAPHY N/A 04/01/2020   Procedure: LEFT HEART CATH AND CORONARY ANGIOGRAPHY;  Surgeon: Runell Gess, MD;  Location: [facility] INVASIVE CV LAB;  Service: Cardiovascular;  Laterality: N/A;    Family History  Problem Relation Age of Onset   Heart attack Maternal Grandmother    Cancer Maternal Grandfather    Heart attack Paternal Grandmother    Heart attack Maternal Uncle    Stroke Neg Hx    Hypertension Neg Hx    Pancreatic cancer Neg Hx    Stomach cancer Neg Hx    Esophageal cancer Neg Hx    Colon cancer Neg Hx     Social History Reviewed with no changes to be made today.   Outpatient Medications Prior to Visit  Medication Sig Dispense Refill   acetaminophen-codeine (TYLENOL #3) 300-30 MG tablet Take 1 tablet by mouth daily as needed for moderate pain (pain score 4-6). 30 tablet 0   amLODipine (NORVASC) 2.5 MG tablet Take 1 tablet (2.5 mg total) by mouth daily. 90 tablet 3   Blood Glucose Monitoring Suppl (TRUE METRIX METER) w/Device KIT Use as instructed. Check blood glucose level by fingerstick twice per day. E11.65 1 kit 0   clopidogrel (PLAVIX) 75 MG tablet Take 1 tablet (75 mg total) by mouth daily. 90 tablet 3   empagliflozin (JARDIANCE) 25 MG TABS tablet Take 1 tablet (25 mg total) by mouth daily before breakfast. 90 tablet 1   ezetimibe (ZETIA) 10 MG tablet Take 1 tablet (10 mg total) by mouth daily. 90 tablet 2   fluticasone furoate-vilanterol (BREO ELLIPTA) 200-25 MCG/ACT AEPB Inhale 1 puff into the lungs daily. 60 each 11   gabapentin (NEURONTIN) 300 MG  capsule Take 1 capsule (300 mg total) by mouth at bedtime. 90 capsule 1   glimepiride (AMARYL) 4 MG tablet Take 2 tablets (8 mg total) by mouth daily with breakfast. 180 tablet 1   lidocaine (LIDODERM) 5 % Place 1 patch onto the skin daily. Remove & Discard patch within 12 hours or as directed by MD 30 patch 0   losartan (COZAAR) 50 MG tablet Take 1 tablet (50 mg total) by mouth daily. 90 tablet 3   metFORMIN (GLUCOPHAGE) 500 MG tablet Take 1 tablet (500 mg total) by mouth 2 (two) times daily with a meal. 180 tablet 1   nitroGLYCERIN (NITROSTAT) 0.4 MG SL tablet Place 1 tablet (0.4 mg total) under the tongue every 5 (five) minutes x 3 doses as needed for chest pain. 25 tablet 12   rosuvastatin (CRESTOR) 40 MG tablet Take 1  tablet (40 mg total) by mouth daily. 90 tablet 0   tiZANidine (ZANAFLEX) 4 MG tablet Take 1 tablet (4 mg total) by mouth every 8 (eight) hours as needed for muscle spasms. 90 tablet 0   Vitamin A 7.5 MG (25000 UT) CAPS Take 50,000 Units by mouth once a week. 30 capsule    No facility-administered medications prior to visit.    Allergies  Allergen Reactions   Aspirin Anaphylaxis, Swelling and Rash    Throat closes    Ibuprofen Anaphylaxis, Swelling and Rash       Objective:    BP (!) 162/96   Pulse 75   Wt (!) 313 lb 9.6 oz (142.2 kg)   SpO2 96%   BMI 39.20 kg/m  Wt Readings from Last 3 Encounters:  06/01/23 (!) 313 lb 9.6 oz (142.2 kg)  05/27/23 (!) 319 lb (144.7 kg)  05/06/23 (!) 319 lb 8 oz (144.9 kg)    Physical Exam Vitals and nursing note reviewed.  Constitutional:      Appearance: He is well-developed.  HENT:     Head: Normocephalic and atraumatic.  Cardiovascular:     Rate and Rhythm: Normal rate and regular rhythm.     Heart sounds: Normal heart sounds. No murmur heard.    No friction rub. No gallop.  Pulmonary:     Effort: Pulmonary effort is normal. No tachypnea or respiratory distress.     Breath sounds: Normal breath sounds. No decreased  breath sounds, wheezing, rhonchi or rales.  Chest:     Chest wall: No tenderness.  Abdominal:     General: Bowel sounds are normal.     Palpations: Abdomen is soft.  Musculoskeletal:        General: Normal range of motion.     Cervical back: Normal range of motion.  Skin:    General: Skin is warm and dry.  Neurological:     Mental Status: He is alert and oriented to person, place, and time.     Coordination: Coordination normal.  Psychiatric:        Behavior: Behavior normal. Behavior is cooperative.        Thought Content: Thought content normal.        Judgment: Judgment normal.          Patient has been counseled extensively about nutrition and exercise as well as the importance of adherence with medications and regular follow-up. The patient was given clear instructions to go to ER or return to medical center if symptoms don't improve, worsen or new problems develop. The patient verbalized understanding.   Follow-up: Return in about 3 months (around 08/30/2023) for SHINGLES VACCINE and HTN F/U.   Claiborne Rigg, FNP-BC [facility] and [facility] [place], [place] [phone]   06/01/2023, 11:59 AM

## 2023-06-03 LAB — MICROALBUMIN / CREATININE URINE RATIO
Creatinine, Urine: 54.8 mg/dL
Microalb/Creat Ratio: 31 mg/g{creat} — ABNORMAL HIGH (ref 0–29)
Microalbumin, Urine: 17 ug/mL

## 2023-06-09 ENCOUNTER — Other Ambulatory Visit: Payer: Self-pay | Admitting: Family Medicine

## 2023-06-09 ENCOUNTER — Other Ambulatory Visit: Payer: Self-pay

## 2023-06-09 DIAGNOSIS — M519 Unspecified thoracic, thoracolumbar and lumbosacral intervertebral disc disorder: Secondary | ICD-10-CM

## 2023-06-09 MED ORDER — TIZANIDINE HCL 4 MG PO TABS
4.0000 mg | ORAL_TABLET | Freq: Three times a day (TID) | ORAL | 0 refills | Status: DC | PRN
  Filled 2023-06-09: qty 90, 30d supply, fill #0

## 2023-06-10 ENCOUNTER — Encounter: Payer: Self-pay | Admitting: Nurse Practitioner

## 2023-06-14 ENCOUNTER — Other Ambulatory Visit: Payer: Self-pay

## 2023-07-08 ENCOUNTER — Other Ambulatory Visit: Payer: Self-pay

## 2023-07-08 ENCOUNTER — Other Ambulatory Visit: Payer: Self-pay | Admitting: Nurse Practitioner

## 2023-07-08 DIAGNOSIS — G8929 Other chronic pain: Secondary | ICD-10-CM

## 2023-07-08 DIAGNOSIS — E785 Hyperlipidemia, unspecified: Secondary | ICD-10-CM

## 2023-07-08 MED ORDER — ROSUVASTATIN CALCIUM 40 MG PO TABS
40.0000 mg | ORAL_TABLET | Freq: Every day | ORAL | 0 refills | Status: DC
  Filled 2023-07-08: qty 90, 90d supply, fill #0

## 2023-07-09 ENCOUNTER — Other Ambulatory Visit: Payer: Self-pay

## 2023-07-11 MED ORDER — ACETAMINOPHEN-CODEINE 300-30 MG PO TABS
1.0000 | ORAL_TABLET | Freq: Every day | ORAL | 0 refills | Status: DC | PRN
  Filled 2023-07-11: qty 30, 30d supply, fill #0

## 2023-07-12 ENCOUNTER — Other Ambulatory Visit: Payer: Self-pay

## 2023-07-13 ENCOUNTER — Other Ambulatory Visit: Payer: Self-pay

## 2023-08-10 ENCOUNTER — Other Ambulatory Visit: Payer: Self-pay

## 2023-08-26 ENCOUNTER — Other Ambulatory Visit: Payer: Self-pay | Admitting: Physician Assistant

## 2023-08-26 ENCOUNTER — Other Ambulatory Visit: Payer: Self-pay | Admitting: Internal Medicine

## 2023-08-26 ENCOUNTER — Other Ambulatory Visit: Payer: Self-pay | Admitting: Family Medicine

## 2023-08-26 DIAGNOSIS — E1165 Type 2 diabetes mellitus with hyperglycemia: Secondary | ICD-10-CM

## 2023-08-27 ENCOUNTER — Other Ambulatory Visit: Payer: Self-pay

## 2023-08-27 MED ORDER — EZETIMIBE 10 MG PO TABS
10.0000 mg | ORAL_TABLET | Freq: Every day | ORAL | 2 refills | Status: DC
  Filled 2023-08-27: qty 90, 90d supply, fill #0
  Filled 2023-11-22: qty 90, 90d supply, fill #1
  Filled 2024-02-24: qty 90, 90d supply, fill #2

## 2023-08-27 MED ORDER — GLIMEPIRIDE 4 MG PO TABS
8.0000 mg | ORAL_TABLET | Freq: Every day | ORAL | 0 refills | Status: DC
  Filled 2023-08-27: qty 180, 90d supply, fill #0

## 2023-08-27 MED ORDER — EMPAGLIFLOZIN 25 MG PO TABS
25.0000 mg | ORAL_TABLET | Freq: Every day | ORAL | 2 refills | Status: DC
  Filled 2023-08-27: qty 90, 90d supply, fill #0
  Filled 2023-11-22: qty 90, 90d supply, fill #1
  Filled 2024-02-24: qty 90, 90d supply, fill #2

## 2023-08-27 NOTE — Telephone Encounter (Signed)
 Requested Prescriptions  Pending Prescriptions Disp Refills   glimepiride  (AMARYL ) 4 MG tablet 180 tablet 0    Sig: Take 2 tablets (8 mg total) by mouth daily with breakfast.     Endocrinology:  Diabetes - Sulfonylureas Passed - 08/27/2023 12:13 PM      Passed - HBA1C is between 0 and 7.9 and within 180 days    HbA1c, POC (controlled diabetic range)  Date Value Ref Range Status  06/01/2023 6.8 0.0 - 7.0 % Final         Passed - Cr in normal range and within 360 days    Creat  Date Value Ref Range Status  12/27/2015 0.75 0.70 - 1.33 mg/dL Final    Comment:      For patients > or = 63 years of age: The upper reference limit for Creatinine is approximately 13% higher for people identified as African-American.      Creatinine, Ser  Date Value Ref Range Status  04/14/2023 1.26 0.76 - 1.27 mg/dL Final   Creatinine, Urine  Date Value Ref Range Status  11/18/2015 390 (H) 20 - 370 mg/dL Final    Comment:    Result confirmed by automatic dilution. Result repeated and verified.          Passed - Valid encounter within last 6 months    Recent Outpatient Visits           2 months ago Right elbow pain   [facility] [place]. [facility] Theotis Haze ORN, NP   7 months ago Splenic lesion   [facility] [place] [facility] Dept Of [place]. [facility] [place], [place] W, NP   9 months ago Type 2 diabetes mellitus with hyperglycemia, without long-term current use of insulin (HCC)   [facility] [place]. [facility] [place], [place] W, NP   1 year ago Type 2 diabetes mellitus with hyperglycemia, without long-term current use of insulin (HCC)   [place] [facility] Of [place]. [facility] Theotis Haze ORN, NP   1 year ago Chronic obstructive pulmonary disease, unspecified COPD type [facility])   [place] [facility] Dept Of [place]. [facility] Theotis Haze ORN, NP       Future Appointments             In 3 days Theotis Haze ORN, NP [facility] [place] - A Dept Of [place]. [facility]

## 2023-08-30 ENCOUNTER — Other Ambulatory Visit: Payer: Self-pay

## 2023-08-30 ENCOUNTER — Encounter: Payer: Self-pay | Admitting: Nurse Practitioner

## 2023-08-30 ENCOUNTER — Ambulatory Visit: Payer: Medicaid Other | Attending: Nurse Practitioner | Admitting: Nurse Practitioner

## 2023-08-30 VITALS — BP 143/84 | HR 80 | Resp 19 | Ht 75.0 in | Wt 306.4 lb

## 2023-08-30 DIAGNOSIS — M25562 Pain in left knee: Secondary | ICD-10-CM | POA: Diagnosis not present

## 2023-08-30 DIAGNOSIS — G8929 Other chronic pain: Secondary | ICD-10-CM

## 2023-08-30 DIAGNOSIS — D696 Thrombocytopenia, unspecified: Secondary | ICD-10-CM | POA: Diagnosis not present

## 2023-08-30 DIAGNOSIS — I10 Essential (primary) hypertension: Secondary | ICD-10-CM | POA: Diagnosis not present

## 2023-08-30 DIAGNOSIS — Z23 Encounter for immunization: Secondary | ICD-10-CM

## 2023-08-30 MED ORDER — ACETAMINOPHEN-CODEINE 300-30 MG PO TABS
1.0000 | ORAL_TABLET | Freq: Every day | ORAL | 0 refills | Status: DC | PRN
  Filled 2023-08-30: qty 30, 30d supply, fill #0

## 2023-08-30 NOTE — Progress Notes (Signed)
 Assessment & Plan:  [facility] was seen today for medical management of chronic issues.  Diagnoses and all orders for this visit:  Essential hypertension Send BP readings via mychart in 2 weeks.  -     CMP14+EGFR Continue all antihypertensives as prescribed.  Reminded to bring in blood pressure log for follow  up appointment.  RECOMMENDATIONS: DASH/Mediterranean Diets are healthier choices for HTN.    Chronic pain of left knee -     acetaminophen -codeine  (TYLENOL  #3) 300-30 MG tablet; Take 1 tablet by mouth daily as needed for moderate pain (pain score 4-6).  Decreased platelet count (HCC) -     CBC with Differential  Need for shingles vaccine -     Varicella-zoster vaccine IM    Patient has been counseled on age-appropriate routine health concerns for screening and prevention. These are reviewed and up-to-date. Referrals have been placed accordingly. Immunizations are up-to-date or declined.    Subjective:   Chief Complaint  Patient presents with   Medical Management of Chronic Issues    [facility] 63 y.o. male presents to office today for follow up to HTN. He is accompanied by his significant other today.  He has a past medical history of Abdominal aortic aneurysm, Aortic atherosclerosis, CAD (09/2014), GERD, History of echocardiogram, HLD, HTN, Hx of NSTEMI 5/16 tx with BMS to [facility] and DES to RPLB3 (09/10/2014), Hyperglycemia, OSA, Pulmonary nodules, COPD, PAD, Tobacco use, Type II diabetes mellitus, and Varicose veins of both lower extremities.    Followed by Cardiology, Vascular, and Pulmonology   HTN Blood pressure is elevated. He is still smoking cigarettes. Also drinking a pot of coffee daily. Working at [employer]. Diet is not always ideal. He is currently prescribed amlodipine  2.5 mg daily and losartan  50 mg daily. I have requested he monitor his blood pressure at home over the next 2 weeks and send me his readings via mychart. We may need to increase on of his  BP meds.   BP Readings from Last 3 Encounters:  08/30/23 (!) 143/84  06/01/23 (!) 162/96  05/27/23 137/80     He is experiencing bilateral knee pain, popping in neck and intermittent generalized joint pain. Knee pain is worse with prolonged standing at [employer] where he is employed. Experiencing both knee pack . Neck popping and cracking. Tylenol  3 does seem to help with pain relief.  Left knee xray 11-10-2022 Mild patellofemoral osteoarthritis.   Review of Systems  Constitutional:  Negative for fever, malaise/fatigue and weight loss.  HENT: Negative.  Negative for nosebleeds.   Eyes: Negative.  Negative for blurred vision, double vision and photophobia.  Respiratory: Negative.  Negative for cough and shortness of breath.   Cardiovascular: Negative.  Negative for chest pain, palpitations and leg swelling.  Gastrointestinal: Negative.  Negative for heartburn, nausea and vomiting.  Musculoskeletal:  Positive for joint pain and myalgias.  Neurological: Negative.  Negative for dizziness, focal weakness, seizures and headaches.  Psychiatric/Behavioral: Negative.  Negative for suicidal ideas.     Past Medical History:  Diagnosis Date   Abdominal aortic aneurysm (AAA) (HCC)    CT 1/22: infrarenal 3.8 cm - repeat US  in 2 years // AAA US  2/22: AAA 3.8 cm; aorto iliac atherosclerosis without hemodynamically significant stenosis   Aortic atherosclerosis (HCC)    CT in 03/2020   CAD (coronary artery disease) 09/2014   a. NSTEMI >> LHC 5/16:  mLAD 15, pRCA 90, mRCA 40, RPLB3 80, EF normal with inf HK >> PCI:  BMS to [facility] and DES to RPLB3 // Cath 11/21: Patent RCA stent, patent RPL stent; medical therapy    GERD (gastroesophageal reflux disease)    History of echocardiogram    Echo 6/16:  EF 50-55%, no RWMA, Gr 1 DD   HLD (hyperlipidemia)    [facility] 03/15/2017   HTN (hypertension)    Hx of NSTEMI 5/16 tx with BMS to pRCA and DES to RPLB3 09/10/2014   Hyperglycemia    OSA (obstructive sleep  apnea)    "dx'd; couldn't tolerate mask" (03/15/2017)   PAD (peripheral artery disease) (HCC)    AAA US  in 06/2020: aorto iliac atherosclerosis   Tobacco use    Type II diabetes mellitus (HCC)    Varicose veins of both lower extremities    S/P ablation 02/2017    Past Surgical History:  Procedure Laterality Date   CARDIAC CATHETERIZATION N/A 09/11/2014   Procedure: Left Heart Cath and Coronary Angiography;  Surgeon: [facility] Lacer, [facility];  Location: [facility];  Service: Cardiovascular;  Laterality: N/A;   CARDIAC CATHETERIZATION  09/11/2014   Procedure: Coronary Stent Intervention;  Surgeon: [facility] Lacer, [facility];  Location: [facility];  Service: Cardiovascular;;   ENDOVENOUS ABLATION SAPHENOUS VEIN W/ LASER Right 02/22/2017   endovenous laser ablation R GSV and stab phlebectomy >20 incisions R leg by [facility]    LEFT HEART CATH AND CORONARY ANGIOGRAPHY N/A 04/01/2020   Procedure: LEFT HEART CATH AND CORONARY ANGIOGRAPHY;  Surgeon: [facility], [facility];  Location: [facility];  Service: Cardiovascular;  Laterality: N/A;    Family History  Problem Relation Age of Onset   Heart attack Maternal Grandmother    Cancer Maternal Grandfather    Heart attack Paternal Grandmother    Heart attack Maternal Uncle    Stroke Neg Hx    Hypertension Neg Hx    Pancreatic cancer Neg Hx    Stomach cancer Neg Hx    Esophageal cancer Neg Hx    Colon cancer Neg Hx     Social History Reviewed with no changes to be made today.   Outpatient Medications Prior to Visit  Medication Sig Dispense Refill   amLODipine  (NORVASC ) 2.5 MG tablet Take 1 tablet (2.5 mg total) by mouth daily. 90 tablet 3   Blood Glucose Monitoring Suppl (TRUE METRIX METER) w/Device KIT Use as instructed. Check blood glucose level by fingerstick twice per day. E11.65 1 kit 0   clopidogrel  (PLAVIX ) 75 MG tablet Take 1 tablet (75 mg total) by mouth daily. 90 tablet 3   empagliflozin  (JARDIANCE ) 25  MG TABS tablet Take 1 tablet (25 mg total) by mouth daily before breakfast. 90 tablet 2   ezetimibe  (ZETIA ) 10 MG tablet Take 1 tablet (10 mg total) by mouth daily. 90 tablet 2   fluticasone  furoate-vilanterol (BREO ELLIPTA ) 200-25 MCG/ACT AEPB Inhale 1 puff into the lungs daily. 60 each 11   gabapentin  (NEURONTIN ) 300 MG capsule Take 1 capsule (300 mg total) by mouth at bedtime. 90 capsule 1   glimepiride  (AMARYL ) 4 MG tablet Take 2 tablets (8 mg total) by mouth daily with breakfast. 180 tablet 0   lidocaine  (LIDODERM ) 5 % Place 1 patch onto the skin daily. Remove & Discard patch within 12 hours or as directed by [facility] 30 patch 0   losartan  (COZAAR ) 50 MG tablet Take 1 tablet (50 mg total) by mouth daily. 90 tablet 3   metFORMIN  (GLUCOPHAGE ) 500 MG tablet Take  1 tablet (500 mg total) by mouth 2 (two) times daily with a meal. 180 tablet 1   nitroGLYCERIN  (NITROSTAT ) 0.4 MG SL tablet Place 1 tablet (0.4 mg total) under the tongue every 5 (five) minutes x 3 doses as needed for chest pain. 25 tablet 12   rosuvastatin  (CRESTOR ) 40 MG tablet Take 1 tablet (40 mg total) by mouth daily. 90 tablet 0   tiZANidine  (ZANAFLEX ) 4 MG tablet Take 1 tablet (4 mg total) by mouth every 8 (eight) hours as needed for muscle spasms. 90 tablet 0   Vitamin A  7.5 MG (25000 UT) CAPS Take 50,000 Units by mouth once a week. 30 capsule    acetaminophen -codeine  (TYLENOL  #3) 300-30 MG tablet Take 1 tablet by mouth daily as needed for moderate pain (pain score 4-6). 30 tablet 0   No facility-administered medications prior to visit.    Allergies  Allergen Reactions   Aspirin Anaphylaxis, Swelling and Rash    Throat closes    Ibuprofen Anaphylaxis, Swelling and Rash       Objective:    BP (!) 143/84 (BP Location: Left Arm, Patient Position: Sitting, Cuff Size: Normal)   Pulse 80   Resp 19   Ht 6\' 3"  (1.905 m)   Wt (!) 306 lb 6.4 oz (139 kg)   SpO2 100%   BMI 38.30 kg/m  Wt Readings from Last 3 Encounters:  08/30/23  (!) 306 lb 6.4 oz (139 kg)  06/01/23 (!) 313 lb 9.6 oz (142.2 kg)  05/27/23 (!) 319 lb (144.7 kg)    Physical Exam Vitals and nursing note reviewed.  Constitutional:      Appearance: He is well-developed. He is obese.  HENT:     Head: Normocephalic and atraumatic.  Cardiovascular:     Rate and Rhythm: Normal rate and regular rhythm.     Heart sounds: Normal heart sounds. No murmur heard.    No friction rub. No gallop.  Pulmonary:     Effort: Pulmonary effort is normal. No tachypnea or respiratory distress.     Breath sounds: Normal breath sounds. No decreased breath sounds, wheezing, rhonchi or rales.  Chest:     Chest wall: No tenderness.  Abdominal:     General: Bowel sounds are normal.     Palpations: Abdomen is soft.  Musculoskeletal:        General: Normal range of motion.     Cervical back: Normal range of motion.  Skin:    General: Skin is warm and dry.  Neurological:     Mental Status: He is alert and oriented to person, place, and time.     Coordination: Coordination normal.  Psychiatric:        Behavior: Behavior normal. Behavior is cooperative.        Thought Content: Thought content normal.        Judgment: Judgment normal.          Patient has been counseled extensively about nutrition and exercise as well as the importance of adherence with medications and regular follow-up. The patient was given clear instructions to go to ER or return to medical center if symptoms don't improve, worsen or new problems develop. The patient verbalized understanding.   Follow-up: Return in about 3 months (around 11/29/2023).   Collins Dean, FNP-BC [facility] [place], [place] [phone]   08/30/2023, 11:38 PM

## 2023-08-31 ENCOUNTER — Encounter: Payer: Self-pay | Admitting: Nurse Practitioner

## 2023-08-31 LAB — CMP14+EGFR
ALT: 28 IU/L (ref 0–44)
AST: 26 IU/L (ref 0–40)
Albumin: 4.3 g/dL (ref 3.9–4.9)
Alkaline Phosphatase: 78 IU/L (ref 44–121)
BUN/Creatinine Ratio: 14 (ref 10–24)
BUN: 13 mg/dL (ref 8–27)
Bilirubin Total: 0.4 mg/dL (ref 0.0–1.2)
CO2: 19 mmol/L — ABNORMAL LOW (ref 20–29)
Calcium: 8.9 mg/dL (ref 8.6–10.2)
Chloride: 104 mmol/L (ref 96–106)
Creatinine, Ser: 0.94 mg/dL (ref 0.76–1.27)
Globulin, Total: 2.4 g/dL (ref 1.5–4.5)
Glucose: 99 mg/dL (ref 70–99)
Potassium: 4.7 mmol/L (ref 3.5–5.2)
Sodium: 138 mmol/L (ref 134–144)
Total Protein: 6.7 g/dL (ref 6.0–8.5)
eGFR: 92 mL/min/{1.73_m2} (ref >59)

## 2023-08-31 LAB — CBC WITH DIFFERENTIAL/PLATELET
Basophils Absolute: 0.1 10*3/uL (ref 0.0–0.2)
Basos: 1 %
EOS (ABSOLUTE): 0.2 10*3/uL (ref 0.0–0.4)
Eos: 4 %
Hematocrit: 45.5 % (ref 37.5–51.0)
Hemoglobin: 15.4 g/dL (ref 13.0–17.7)
Immature Grans (Abs): 0 10*3/uL (ref 0.0–0.1)
Immature Granulocytes: 0 %
Lymphocytes Absolute: 2 10*3/uL (ref 0.7–3.1)
Lymphs: 36 %
MCH: 32.8 pg (ref 26.6–33.0)
MCHC: 33.8 g/dL (ref 31.5–35.7)
MCV: 97 fL (ref 79–97)
Monocytes Absolute: 0.6 10*3/uL (ref 0.1–0.9)
Monocytes: 10 %
Neutrophils Absolute: 2.8 10*3/uL (ref 1.4–7.0)
Neutrophils: 49 %
Platelets: 154 10*3/uL (ref 150–450)
RBC: 4.69 x10E6/uL (ref 4.14–5.80)
RDW: 14.2 % (ref 11.6–15.4)
WBC: 5.7 10*3/uL (ref 3.4–10.8)

## 2023-09-03 ENCOUNTER — Other Ambulatory Visit: Payer: Self-pay | Admitting: *Deleted

## 2023-09-03 DIAGNOSIS — Z87891 Personal history of nicotine dependence: Secondary | ICD-10-CM

## 2023-09-03 DIAGNOSIS — Z122 Encounter for screening for malignant neoplasm of respiratory organs: Secondary | ICD-10-CM

## 2023-09-03 DIAGNOSIS — F17210 Nicotine dependence, cigarettes, uncomplicated: Secondary | ICD-10-CM

## 2023-09-08 ENCOUNTER — Other Ambulatory Visit: Payer: Self-pay | Admitting: Internal Medicine

## 2023-09-10 ENCOUNTER — Other Ambulatory Visit: Payer: Self-pay

## 2023-09-10 ENCOUNTER — Other Ambulatory Visit: Payer: Self-pay | Admitting: *Deleted

## 2023-09-10 DIAGNOSIS — I714 Abdominal aortic aneurysm, without rupture, unspecified: Secondary | ICD-10-CM

## 2023-09-10 MED ORDER — CLOPIDOGREL BISULFATE 75 MG PO TABS
75.0000 mg | ORAL_TABLET | Freq: Every day | ORAL | 2 refills | Status: DC
  Filled 2023-09-10: qty 90, 90d supply, fill #0
  Filled 2023-12-04: qty 90, 90d supply, fill #1
  Filled 2024-03-07: qty 90, 90d supply, fill #2

## 2023-09-14 ENCOUNTER — Other Ambulatory Visit: Payer: Self-pay

## 2023-09-14 ENCOUNTER — Encounter: Payer: Self-pay | Admitting: Nurse Practitioner

## 2023-09-15 ENCOUNTER — Ambulatory Visit (HOSPITAL_COMMUNITY): Payer: Medicaid Other

## 2023-09-15 ENCOUNTER — Encounter: Payer: Self-pay | Admitting: Physician Assistant

## 2023-09-15 ENCOUNTER — Ambulatory Visit (HOSPITAL_COMMUNITY)
Admission: RE | Admit: 2023-09-15 | Discharge: 2023-09-15 | Disposition: A | Discharge status: Home / Self Care | Payer: Medicaid Other | Source: Ambulatory Visit | Attending: Vascular Surgery | Admitting: Vascular Surgery

## 2023-09-15 VITALS — BP 160/98 | HR 80 | Temp 98.3°F | Wt 308.1 lb

## 2023-09-15 DIAGNOSIS — I714 Abdominal aortic aneurysm, without rupture, unspecified: Secondary | ICD-10-CM

## 2023-09-15 NOTE — Progress Notes (Signed)
 HISTORY AND PHYSICAL     CC:  follow up. Requesting Provider:  Collins Dean, NP  HPI: This is a 63 y.o. male who is here today for follow up for AAA.    Pt was last seen 08/28/2022 and at that time, he did have some abdominal pain but no new back pain.  He has risk factors for vascular disease of type 2 diabetes, hypertension, hyperlipidemia and coronary artery disease and he is a longtime smoker. He did have varicose veins treated in the past by Dr. Timm Foot several years ago.  He did not have any ulcerations but did have some recurrence of veins in the right medial calf but he did not have any issues with this.  He had limitation walking but was unable to determine if it was due to claudication.  His AAA measured 4.2 cm on duplex and had increased about 1/2 cm over 1.5 years. He had significant fibrofatty plaques throughout the aorta as well as the bifurcation calcifications, which accounted for lack of femoral pulses and mild decrease in ABI.    The pt returns today for follow up and here with his significant other.  He states that he has been having bright red blood per rectum for about a week.  He states that even if he does not have a bowel movement, there is blood in the toilet.  He states his abdominal pain has gotten worse.  He states that some days are worse than others and it not really associated with anything.  Food does not make it worse.  He states that he takes Tylenol  #3 and that helps ease his pain.  He does have some back pain as well.  His blood pressure is elevated today.  He states that when he took it at home yesterday it was around 140/90.    Pt states he went to the ER in September last year for similar symptoms.  He did have a CT scan and it showed that his AAA remained about 4.4cm.   He was also found to have sigmoid diverticulosis without inflammation.  He was sent to GI with Dr. Elvin Hammer in December.  He underwent colonoscopy and per pt, he had some polyps that were non  cancerous.    His significant other states that the pt eats seeds and she is concerned he is having a flare due to this.    He states that he does not really have any claudication, rest pain or non healing wounds.  He states that he does play golf occasionally and his legs do not trouble him during this.  He states if he is on his legs for a long period of time, his legs will swell.    The pt is on a statin for cholesterol management.    The pt is not on an aspirin.    Other AC:  Plavix  for hx of MI with stenting The pt is on CCB for hypertension.  The pt is  on medication diabetes. Tobacco hx:  current  Pt does not have family hx of AAA.  He does have children.   Past Medical History:  Diagnosis Date   Abdominal aortic aneurysm (AAA) (HCC)    CT 1/22: infrarenal 3.8 cm - repeat US  in 2 years // AAA US  2/22: AAA 3.8 cm; aorto iliac atherosclerosis without hemodynamically significant stenosis   Aortic atherosclerosis (HCC)    CT in 03/2020   CAD (coronary artery disease) 09/2014   a. NSTEMI >>  LHC 5/16:  mLAD 15, pRCA 90, mRCA 40, RPLB3 80, EF normal with inf HK >> PCI:  BMS to pRCA and DES to RPLB3 // Cath 11/21: Patent RCA stent, patent RPL stent; medical therapy    GERD (gastroesophageal reflux disease)    History of echocardiogram    Echo 6/16:  EF 50-55%, no RWMA, Gr 1 DD   HLD (hyperlipidemia)    Maximo Spar 03/15/2017   HTN (hypertension)    Hx of NSTEMI 5/16 tx with BMS to pRCA and DES to RPLB3 09/10/2014   Hyperglycemia    OSA (obstructive sleep apnea)    "dx'd; couldn't tolerate mask" (03/15/2017)   PAD (peripheral artery disease) (HCC)    AAA US  in 06/2020: aorto iliac atherosclerosis   Tobacco use    Type II diabetes mellitus (HCC)    Varicose veins of both lower extremities    S/P ablation 02/2017    Past Surgical History:  Procedure Laterality Date   CARDIAC CATHETERIZATION N/A 09/11/2014   Procedure: Left Heart Cath and Coronary Angiography;  Surgeon: Arleen Lacer,  MD;  Location: [facility] INVASIVE CV LAB CUPID;  Service: Cardiovascular;  Laterality: N/A;   CARDIAC CATHETERIZATION  09/11/2014   Procedure: Coronary Stent Intervention;  Surgeon: Arleen Lacer, MD;  Location: [facility] INVASIVE CV LAB CUPID;  Service: Cardiovascular;;   ENDOVENOUS ABLATION SAPHENOUS VEIN W/ LASER Right 02/22/2017   endovenous laser ablation R GSV and stab phlebectomy >20 incisions R leg by Merced Stair MD    LEFT HEART CATH AND CORONARY ANGIOGRAPHY N/A 04/01/2020   Procedure: LEFT HEART CATH AND CORONARY ANGIOGRAPHY;  Surgeon: Avanell Leigh, MD;  Location: [facility] INVASIVE CV LAB;  Service: Cardiovascular;  Laterality: N/A;    Allergies  Allergen Reactions   Aspirin Anaphylaxis, Swelling and Rash    Throat closes    Ibuprofen Anaphylaxis, Swelling and Rash    Current Outpatient Medications  Medication Sig Dispense Refill   acetaminophen -codeine  (TYLENOL  #3) 300-30 MG tablet Take 1 tablet by mouth daily as needed for moderate pain (pain score 4-6). 30 tablet 0   amLODipine  (NORVASC ) 2.5 MG tablet Take 1 tablet (2.5 mg total) by mouth daily. 90 tablet 3   Blood Glucose Monitoring Suppl (TRUE METRIX METER) w/Device KIT Use as instructed. Check blood glucose level by fingerstick twice per day. E11.65 1 kit 0   clopidogrel  (PLAVIX ) 75 MG tablet Take 1 tablet (75 mg total) by mouth daily. 90 tablet 2   empagliflozin  (JARDIANCE ) 25 MG TABS tablet Take 1 tablet (25 mg total) by mouth daily before breakfast. 90 tablet 2   ezetimibe  (ZETIA ) 10 MG tablet Take 1 tablet (10 mg total) by mouth daily. 90 tablet 2   fluticasone  furoate-vilanterol (BREO ELLIPTA ) 200-25 MCG/ACT AEPB Inhale 1 puff into the lungs daily. 60 each 11   gabapentin  (NEURONTIN ) 300 MG capsule Take 1 capsule (300 mg total) by mouth at bedtime. 90 capsule 1   glimepiride  (AMARYL ) 4 MG tablet Take 2 tablets (8 mg total) by mouth daily with breakfast. 180 tablet 0   lidocaine  (LIDODERM ) 5 % Place 1 patch onto the skin daily.  Remove & Discard patch within 12 hours or as directed by MD 30 patch 0   losartan  (COZAAR ) 50 MG tablet Take 1 tablet (50 mg total) by mouth daily. 90 tablet 3   metFORMIN  (GLUCOPHAGE ) 500 MG tablet Take 1 tablet (500 mg total) by mouth 2 (two) times daily with a meal. 180 tablet 1   nitroGLYCERIN  (NITROSTAT )  0.4 MG SL tablet Place 1 tablet (0.4 mg total) under the tongue every 5 (five) minutes x 3 doses as needed for chest pain. 25 tablet 12   rosuvastatin  (CRESTOR ) 40 MG tablet Take 1 tablet (40 mg total) by mouth daily. 90 tablet 0   tiZANidine  (ZANAFLEX ) 4 MG tablet Take 1 tablet (4 mg total) by mouth every 8 (eight) hours as needed for muscle spasms. 90 tablet 0   Vitamin A  7.5 MG (25000 UT) CAPS Take 50,000 Units by mouth once a week. 30 capsule    No current facility-administered medications for this visit.    Family History  Problem Relation Age of Onset   Heart attack Maternal Grandmother    Cancer Maternal Grandfather    Heart attack Paternal Grandmother    Heart attack Maternal Uncle    Stroke Neg Hx    Hypertension Neg Hx    Pancreatic cancer Neg Hx    Stomach cancer Neg Hx    Esophageal cancer Neg Hx    Colon cancer Neg Hx     Social History   Socioeconomic History   Marital status: Widowed    Spouse name: Not on file   Number of children: Not on file   Years of education: Not on file   Highest education level: Not on file  Occupational History   Occupation: Service water purification systems  Tobacco Use   Smoking status: Some Days    Current packs/day: 1.00    Average packs/day: 1 pack/day for 47.3 years (47.3 ttl pk-yrs)    Types: Cigarettes    Start date: 66   Smokeless tobacco: Former    Types: Chew   Tobacco comments:    Smokes 5 packs a week. Tay 11/27/21  Vaping Use   Vaping status: Never Used  Substance and Sexual Activity   Alcohol use: Yes    Alcohol/week: 6.0 standard drinks of alcohol    Types: 6 Cans of beer per week    Comment: daily    Drug use: No   Sexual activity: Not Currently  Other Topics Concern   Not on file  Social History Narrative   Lives with fiance and 2 daughters. Neither his parents nor siblings have any cardiac issues that he knows of.   Social Drivers of Corporate investment banker Strain: Low Risk  (06/01/2023)   Overall Financial Resource Strain (CARDIA)    Difficulty of Paying Living Expenses: Not hard at all  Food Insecurity: No Food Insecurity (06/01/2023)   Hunger Vital Sign    Worried About Running Out of Food in the Last Year: Never true    Ran Out of Food in the Last Year: Never true  Transportation Needs: No Transportation Needs (06/01/2023)   PRAPARE - Administrator, Civil Service (Medical): No    Lack of Transportation (Non-Medical): No  Physical Activity: Inactive (06/01/2023)   Exercise Vital Sign    Days of Exercise per Week: 0 days    Minutes of Exercise per Session: 0 min  Stress: No Stress Concern Present (06/01/2023)   [employer] of Occupational Health - Occupational Stress Questionnaire    Feeling of Stress : Not at all  Social Connections: Moderately Integrated (06/01/2023)   Social Connection and Isolation Panel [NHANES]    Frequency of Communication with Friends and Family: Twice a week    Frequency of Social Gatherings with Friends and Family: Once a week    Attends Religious Services: 1 to 4 times  per year    Active Member of Clubs or Organizations: Yes    Attends Banker Meetings: Never    Marital Status: Widowed  Catering manager Violence: Not on file     REVIEW OF SYSTEMS:   [X]  denotes positive finding, [ ]  denotes negative finding Cardiac  Comments:  Chest pain or chest pressure:    Shortness of breath upon exertion:    Short of breath when lying flat:    Irregular heart rhythm:        Vascular    Pain in calf, thigh, or hip brought on by ambulation:    Pain in feet at night that wakes you up from your sleep:     Blood clot  in your veins:    Leg swelling:         Pulmonary    Oxygen  at home:    Productive cough:     Wheezing:         Neurologic    Sudden weakness in arms or legs:     Sudden numbness in arms or legs:     Sudden onset of difficulty speaking or slurred speech:    Temporary loss of vision in one eye:     Problems with dizziness:         Gastrointestinal    Blood in stool:  x   Vomited blood:         Genitourinary    Burning when urinating:     Blood in urine:        Psychiatric    Major depression:         Hematologic    Bleeding problems:    Problems with blood clotting too easily:        Skin    Rashes or ulcers:        Constitutional    Fever or chills:      PHYSICAL EXAMINATION:  Today's Vitals   09/15/23 0835  BP: (!) 160/98  Pulse: 80  Temp: 98.3 F (36.8 C)  TempSrc: Temporal  SpO2: 94%  Weight: (!) 308 lb 1.6 oz (139.8 kg)  PainSc: 6    Body mass index is 38.51 kg/m.   General:  WDWN in NAD; vital signs documented above Gait: Not observed HENT: WNL, normocephalic Pulmonary: normal non-labored breathing  Cardiac: regular HR, without carotid bruits Abdomen: soft, slightly tender to palpation; aortic pulse is not palpable Skin: without rashes Vascular Exam/Pulses:  Right Left  Radial 2+ (normal) 2+ (normal)  Femoral 1+ (weak) 1+ (weak)  Popliteal Unable to palpate Unable to palpate  DP 1+ (weak) 1+ (weak)  PT Unable to palpate Unable to palpate   Extremities: without ischemic changes, without Gangrene , without cellulitis; without open wounds; hemosiderin staining bilateral lower legs Musculoskeletal: no muscle wasting or atrophy  Neurologic: A&O X 3;  No focal weakness or paresthesias are detected Psychiatric:  The pt has Normal affect.   Non-Invasive Vascular Imaging:   AAA Arterial duplex on 09/15/2023: Abdominal Aorta Findings:  +-------------+-------+----------+----------+  Location    AP (cm)Trans (cm)PSV (cm/s)   +-------------+-------+----------+----------+  Proximal    2.34   2.40      106         +-------------+-------+----------+----------+  Mid         3.45   3.76      77          +-------------+-------+----------+----------+  Distal      4.34   4.49  83          +-------------+-------+----------+----------+  RT CIA Prox  1.2    1.3       198         +-------------+-------+----------+----------+  RT EIA Distal0.8    0.8       143         +-------------+-------+----------+----------+  LT CIA Prox  1.0    1.1       285         +-------------+-------+----------+----------+  LT CIA Mid                    156         +-------------+-------+----------+----------+  LT EIA Distal0.8    0.9       174         +-------------+-------+----------+----------+   Summary:    - Patent abdominal aorta with evidence of AAA measuring a maximum diamter of 4.49 cm. (Prior CTA 01/20/23 4.4 x 4.2 cm)  - Patent iliac arteries with elevated velocities in the left common iliac artery suggestive of >50% stenosis.    Previous AAA arterial duplex on 08/20/2022: Abdominal Aorta Findings:  +-----------+-------+----------+----------+-----------+--------+-----------  Location  AP (cm)Trans (cm)PSV (cm/s)Waveform   ThrombusComments    +-----------+-------+----------+----------+-----------+--------+-----------   Proximal  2.50   2.50      107                                      +-----------+-------+----------+----------+-----------+--------+-----------  Mid       3.10   3.10      83                           fusiform    +-----------+-------+----------+----------+-----------+--------+-----------  Distal    4.20   4.20      96                           fusiform    +-----------+-------+----------+----------+-----------+--------+-----------  RT CIA Prox1.5    1.5       162       multiphasic        not at ostium   +-----------+-------+----------+----------+-----------+--------+-----------  RT EIA Prox1.1    1.1       147       biphasic           not at ostium  +-----------+-------+----------+----------+-----------+--------+-----------  LT CIA Prox1.5    1.5       190       biphasic           not at ostium  +-----------+-------+----------+----------+-----------+--------+-----------  LT EIA Prox1.2    1.2       181       multiphasic                    +-----------+-------+----------+----------+-----------+--------+-----------   IVC/Iliac Findings:  +--------+------+--------+--------+   IVC   PatentThrombusComments  +--------+------+--------+--------+  IVC Proxpatent                  +--------+------+--------+--------+   Summary:  Abdominal Aorta: There is evidence of abnormal dilatation of the mid and  distal Abdominal aorta. The largest aortic measurement is 4.2 cm. The  largest aortic diameter has increased compared to prior exam. Previous  diameter measurement was 3.8 cm obtained   on  07/17/2021.   Stenosis: +--------------------+-------------+--------------+  Location            Stenosis     Comments        +--------------------+-------------+--------------+  Right Common Iliac  <50% stenosislow end range   +--------------------+-------------+--------------+  Left Common Iliac   <50% stenosislow end range   +--------------------+-------------+--------------+  Right External Iliac<50% stenosishigh end range  +--------------------+-------------+--------------+  Left External Iliac <50% stenosishigh end range  +--------------------+-------------+--------------+    ASSESSMENT/PLAN:: 63 y.o. male here for follow up for AAA  -pt with increased abdominal pain that waxes and wanes.  He also has some back pain as well as bright red blood per rectum.    -discussed pt with Dr. Vikki Graves and he reviewed CTA from September and aneurysm was stable  without any concern for aortoenteric fistula.  Duplex today reveals AAA measures 4.5cm and essentially unchanged from CT scan from September.    -recommend pt going to ER or contacting PCP for further workup for lower GIB.  He does have a hx of diverticulosis and significant other reports he has been eating seeds. He is on Plavix , which he is on for hx of MI with cardiac stents.  Most likely will need to be held but will defer to MD working pt up.    -discussion about repair of AAA with them and discussed that we do not generally repair until it reaches 5.5cm.  discussed that there can be significant complications with open repair as well as stent graft and that it may not ever need to be repaired.    Discussed the importance of smoking cessation and this puts him at risk for limb loss, heart attack, stroke, cancer, lung problems and can contribute to enlargement of his aneurysm.    -pt will f/u in one year with AAA duplex.  Discussed if he develops sudden, severe abdominal or back pain to call 911.     Maryanna Smart, [facility] [phone]  Clinic MD:   Vikki Graves

## 2023-10-06 ENCOUNTER — Other Ambulatory Visit: Payer: Self-pay

## 2023-10-06 ENCOUNTER — Other Ambulatory Visit: Payer: Self-pay | Admitting: Family Medicine

## 2023-10-06 DIAGNOSIS — E785 Hyperlipidemia, unspecified: Secondary | ICD-10-CM

## 2023-10-06 MED ORDER — ROSUVASTATIN CALCIUM 40 MG PO TABS
40.0000 mg | ORAL_TABLET | Freq: Every day | ORAL | 0 refills | Status: DC
  Filled 2023-10-06: qty 90, 90d supply, fill #0

## 2023-10-11 ENCOUNTER — Other Ambulatory Visit: Payer: Self-pay

## 2023-10-20 ENCOUNTER — Encounter: Payer: Self-pay | Admitting: Internal Medicine

## 2023-11-02 ENCOUNTER — Other Ambulatory Visit: Payer: Self-pay | Admitting: Nurse Practitioner

## 2023-11-02 DIAGNOSIS — G8929 Other chronic pain: Secondary | ICD-10-CM

## 2023-11-03 ENCOUNTER — Other Ambulatory Visit: Payer: Self-pay

## 2023-11-03 MED ORDER — ACETAMINOPHEN-CODEINE 300-30 MG PO TABS
1.0000 | ORAL_TABLET | Freq: Every day | ORAL | 0 refills | Status: DC | PRN
  Filled 2023-11-03: qty 30, 30d supply, fill #0

## 2023-11-08 ENCOUNTER — Other Ambulatory Visit: Payer: Self-pay

## 2023-11-09 ENCOUNTER — Other Ambulatory Visit: Payer: Self-pay

## 2023-11-22 ENCOUNTER — Other Ambulatory Visit: Payer: Self-pay | Admitting: Nurse Practitioner

## 2023-11-22 ENCOUNTER — Other Ambulatory Visit: Payer: Self-pay

## 2023-11-22 DIAGNOSIS — E1165 Type 2 diabetes mellitus with hyperglycemia: Secondary | ICD-10-CM

## 2023-11-22 MED ORDER — GLIMEPIRIDE 4 MG PO TABS
8.0000 mg | ORAL_TABLET | Freq: Every day | ORAL | 0 refills | Status: DC
  Filled 2023-11-22: qty 180, 90d supply, fill #0

## 2023-11-25 ENCOUNTER — Other Ambulatory Visit: Payer: Self-pay

## 2023-11-29 ENCOUNTER — Encounter: Payer: Self-pay | Admitting: Nurse Practitioner

## 2023-11-29 ENCOUNTER — Other Ambulatory Visit: Payer: Self-pay

## 2023-11-29 ENCOUNTER — Ambulatory Visit
Admission: RE | Admit: 2023-11-29 | Discharge: 2023-11-29 | Disposition: A | Discharge status: Home / Self Care | Source: Ambulatory Visit | Attending: Nurse Practitioner | Admitting: Nurse Practitioner

## 2023-11-29 ENCOUNTER — Ambulatory Visit: Attending: Nurse Practitioner | Admitting: Nurse Practitioner

## 2023-11-29 VITALS — BP 139/88 | HR 75 | Resp 19 | Ht 75.0 in | Wt 308.6 lb

## 2023-11-29 DIAGNOSIS — M25561 Pain in right knee: Secondary | ICD-10-CM

## 2023-11-29 DIAGNOSIS — I10 Essential (primary) hypertension: Secondary | ICD-10-CM | POA: Diagnosis not present

## 2023-11-29 DIAGNOSIS — G8929 Other chronic pain: Secondary | ICD-10-CM

## 2023-11-29 DIAGNOSIS — M519 Unspecified thoracic, thoracolumbar and lumbosacral intervertebral disc disorder: Secondary | ICD-10-CM | POA: Diagnosis not present

## 2023-11-29 DIAGNOSIS — Z7984 Long term (current) use of oral hypoglycemic drugs: Secondary | ICD-10-CM

## 2023-11-29 DIAGNOSIS — M25562 Pain in left knee: Secondary | ICD-10-CM

## 2023-11-29 DIAGNOSIS — E1165 Type 2 diabetes mellitus with hyperglycemia: Secondary | ICD-10-CM

## 2023-11-29 DIAGNOSIS — E785 Hyperlipidemia, unspecified: Secondary | ICD-10-CM

## 2023-11-29 MED ORDER — ROSUVASTATIN CALCIUM 40 MG PO TABS
40.0000 mg | ORAL_TABLET | Freq: Every day | ORAL | 1 refills | Status: AC
  Filled 2023-11-29 – 2024-01-07 (×2): qty 90, 90d supply, fill #0
  Filled 2024-04-05: qty 90, 90d supply, fill #1

## 2023-11-29 MED ORDER — METFORMIN HCL 500 MG PO TABS
500.0000 mg | ORAL_TABLET | Freq: Two times a day (BID) | ORAL | 1 refills | Status: AC
  Filled 2023-11-29 – 2024-05-20 (×2): qty 180, 90d supply, fill #0

## 2023-11-29 MED ORDER — TIZANIDINE HCL 4 MG PO TABS
4.0000 mg | ORAL_TABLET | Freq: Three times a day (TID) | ORAL | 0 refills | Status: DC | PRN
  Filled 2023-11-29: qty 90, 30d supply, fill #0

## 2023-11-29 MED ORDER — AMLODIPINE BESYLATE 5 MG PO TABS
5.0000 mg | ORAL_TABLET | Freq: Every day | ORAL | 1 refills | Status: DC
  Filled 2023-11-29: qty 90, 90d supply, fill #0
  Filled 2024-02-24: qty 90, 90d supply, fill #1

## 2023-11-29 MED ORDER — GLIMEPIRIDE 4 MG PO TABS
8.0000 mg | ORAL_TABLET | Freq: Every day | ORAL | 1 refills | Status: AC
  Filled 2023-11-29 – 2024-02-24 (×2): qty 180, 90d supply, fill #0
  Filled 2024-05-20: qty 180, 90d supply, fill #1

## 2023-11-29 MED ORDER — GABAPENTIN 300 MG PO CAPS
300.0000 mg | ORAL_CAPSULE | Freq: Every day | ORAL | 1 refills | Status: AC
  Filled 2023-11-29: qty 90, 90d supply, fill #0

## 2023-11-29 NOTE — Patient Instructions (Addendum)
 Amlodipine  increased to 5 mg If you have amlodipine  already at home you can take 2 of the 2.5 tablets to make 5 mg When you pick up the new prescription it will be a 5 mg tablet. You will take 1 tablet at that time.    William Young Ph# [phone] [address] [place], [place] [address]

## 2023-11-29 NOTE — Progress Notes (Signed)
 Assessment & Plan:  Peter was seen today for hypertension.  Diagnoses and all orders for this visit:  Essential hypertension -     amLODipine  (NORVASC ) 5 MG tablet; Take 1 tablet (5 mg total) by mouth daily. Home blood pressure averages 130/80 mmHg, slightly elevated in clinic. Current regimen includes losartan  and amlodipine . Plan to increase amlodipine  for better control. - Increase amlodipine  to 5 mg daily by taking two 2.5 mg tablets until current supply is exhausted. - Continue losartan  50mg  as prescribed. - Monitor blood pressure at home and report if consistently above 140/90 mmHg.  Lumbar disc disease -     gabapentin  (NEURONTIN ) 300 MG capsule; Take 1 capsule (300 mg total) by mouth at bedtime. -     tiZANidine  (ZANAFLEX ) 4 MG tablet; Take 1 tablet (4 mg total) by mouth every 8 (eight) hours as needed for muscle spasms.  Type 2 diabetes mellitus with hyperglycemia, without long-term current use of insulin  A1c currently at goal -     glimepiride  (AMARYL ) 4 MG tablet; Take 2 tablets (8 mg total) by mouth daily with breakfast. -     metFORMIN  (GLUCOPHAGE ) 500 MG tablet; Take 1 tablet (500 mg total) by mouth 2 (two) times daily with a meal. -     CMP14+EGFR -     Hemoglobin A1c Lab Results  Component Value Date   HGBA1C 6.8 06/01/2023     Hyperlipidemia LDL goal <70 -     rosuvastatin  (CRESTOR ) 40 MG tablet; Take 1 tablet (40 mg total) by mouth daily. -     Lipid panel  Chronic pain of both knees -     Ambulatory referral to Orthopedic Surgery -     DG Knee Complete 4 Views Right; Future -     DG Knee Complete 4 Views Left; Future Chronic knee pain with associated bilateral hip pain likely from degenerative joint disease in both knees.  - Refer back to [facility] for orthopedic evaluation. - Order x-rays for bilateral knees at [facility]. - Consider hip x-rays if recommended by orthopedist. - Continue current pain management regimen and refill Tylenol  3 as  needed.   General Health Maintenance Routine health maintenance includes monitoring cholesterol, A1c, kidney, and liver function. - Order blood tests for cholesterol, A1c, kidney, and liver function.   Patient has been counseled on age-appropriate routine health concerns for screening and prevention. These are reviewed and up-to-date. Referrals have been placed accordingly. Immunizations are up-to-date or declined.    Subjective:   Chief Complaint  Patient presents with   Hypertension    History of Present Illness Constant Mandeville is a 63 year old male with hypertension who presents for blood pressure management and joint pain.  He is accompanied by his significant other.  He has a past medical history of Abdominal aortic aneurysm, Aortic atherosclerosis, CAD (09/2014), GERD, History of echocardiogram, HLD, HTN, Hx of NSTEMI 5/16 tx with BMS to [facility] and DES to RPLB3 (09/10/2014), Hyperglycemia, OSA, Pulmonary nodules, COPD, PAD, Tobacco use, Type II diabetes mellitus, and Varicose veins of both lower extremities.    Followed by Cardiology, Vascular, and Pulmonology     He has been monitoring his blood pressure at home, with readings typically around 130/80 mmHg. He notes higher readings in the mornings, particularly after consuming coffee and smoking cigarettes. He drinks multiple cups of caffeinated coffee daily. His current medications for hypertension include losartan  and amlodipine . BP Readings from Last 3 Encounters:  11/29/23 139/88  09/15/23 ROLLEN)  160/98  08/30/23 (!) 143/84     OA He experiences significant pain in both knees and hip, which worsens with physical activity, especially after work. He uses pain medications, including tylenol  3, as needed for severe pain, primarily after work. The pain affects his ability to climb stairs and causes discomfort when rolling over in bed at night. The patient recalls that it has been about a year since his last x-ray, which was done on  his left knee. He has seen orthopedist Dr. Barbarann for his knee pain in the past.   Review of Systems  Constitutional:  Negative for fever, malaise/fatigue and weight loss.  HENT: Negative.  Negative for nosebleeds.   Eyes: Negative.  Negative for blurred vision, double vision and photophobia.  Respiratory: Negative.  Negative for cough and shortness of breath.   Cardiovascular: Negative.  Negative for chest pain, palpitations and leg swelling.  Gastrointestinal: Negative.  Negative for heartburn, nausea and vomiting.  Musculoskeletal:  Positive for joint pain. Negative for myalgias.  Neurological: Negative.  Negative for dizziness, focal weakness, seizures and headaches.  Psychiatric/Behavioral: Negative.  Negative for suicidal ideas.     Past Medical History:  Diagnosis Date   Abdominal aortic aneurysm (AAA) (HCC)    CT 1/22: infrarenal 3.8 cm - repeat US  in 2 years // AAA US  2/22: AAA 3.8 cm; aorto iliac atherosclerosis without hemodynamically significant stenosis   Aortic atherosclerosis (HCC)    CT in 03/2020   CAD (coronary artery disease) 09/2014   a. NSTEMI >> LHC 5/16:  mLAD 15, pRCA 90, mRCA 40, RPLB3 80, EF normal with inf HK >> PCI:  BMS to pRCA and DES to RPLB3 // Cath 11/21: Patent RCA stent, patent RPL stent; medical therapy    GERD (gastroesophageal reflux disease)    History of echocardiogram    Echo 6/16:  EF 50-55%, no RWMA, Gr 1 DD   HLD (hyperlipidemia)    thelbert 03/15/2017   HTN (hypertension)    Hx of NSTEMI 5/16 tx with BMS to [facility] and DES to RPLB3 09/10/2014   Hyperglycemia    OSA (obstructive sleep apnea)    dx'd; couldn't tolerate mask (03/15/2017)   PAD (peripheral artery disease) (HCC)    AAA US  in 06/2020: aorto iliac atherosclerosis   Tobacco use    Type II diabetes mellitus (HCC)    Varicose veins of both lower extremities    S/P ablation 02/2017    Past Surgical History:  Procedure Laterality Date   CARDIAC CATHETERIZATION N/A 09/11/2014    Procedure: Left Heart Cath and Coronary Angiography;  Surgeon: Alm LELON Clay, MD;  Location: [facility] INVASIVE CV LAB CUPID;  Service: Cardiovascular;  Laterality: N/A;   CARDIAC CATHETERIZATION  09/11/2014   Procedure: Coronary Stent Intervention;  Surgeon: Alm LELON Clay, MD;  Location: [facility] INVASIVE CV LAB CUPID;  Service: Cardiovascular;;   ENDOVENOUS ABLATION SAPHENOUS VEIN W/ LASER Right 02/22/2017   endovenous laser ablation R GSV and stab phlebectomy >20 incisions R leg by Lynwood Collum MD    LEFT HEART CATH AND CORONARY ANGIOGRAPHY N/A 04/01/2020   Procedure: LEFT HEART CATH AND CORONARY ANGIOGRAPHY;  Surgeon: Court Dorn PARAS, MD;  Location: [facility] INVASIVE CV LAB;  Service: Cardiovascular;  Laterality: N/A;    Family History  Problem Relation Age of Onset   Heart attack Maternal Grandmother    Cancer Maternal Grandfather    Heart attack Paternal Grandmother    Heart attack Maternal Uncle    Stroke Neg Hx  Hypertension Neg Hx    Pancreatic cancer Neg Hx    Stomach cancer Neg Hx    Esophageal cancer Neg Hx    Colon cancer Neg Hx     Social History Reviewed with no changes to be made today.   Outpatient Medications Prior to Visit  Medication Sig Dispense Refill   acetaminophen -codeine  (TYLENOL  #3) 300-30 MG tablet Take 1 tablet by mouth daily as needed for moderate pain (pain score 4-6). 30 tablet 0   Blood Glucose Monitoring Suppl (TRUE METRIX METER) w/Device KIT Use as instructed. Check blood glucose level by fingerstick twice per day. E11.65 1 kit 0   clopidogrel  (PLAVIX ) 75 MG tablet Take 1 tablet (75 mg total) by mouth daily. 90 tablet 2   empagliflozin  (JARDIANCE ) 25 MG TABS tablet Take 1 tablet (25 mg total) by mouth daily before breakfast. 90 tablet 2   ezetimibe  (ZETIA ) 10 MG tablet Take 1 tablet (10 mg total) by mouth daily. 90 tablet 2   fluticasone  furoate-vilanterol (BREO ELLIPTA ) 200-25 MCG/ACT AEPB Inhale 1 puff into the lungs daily. 60 each 11   losartan  (COZAAR ) 50 MG  tablet Take 1 tablet (50 mg total) by mouth daily. 90 tablet 3   nitroGLYCERIN  (NITROSTAT ) 0.4 MG SL tablet Place 1 tablet (0.4 mg total) under the tongue every 5 (five) minutes x 3 doses as needed for chest pain. 25 tablet 12   Vitamin A  7.5 MG (25000 UT) CAPS Take 50,000 Units by mouth once a week. 30 capsule    amLODipine  (NORVASC ) 2.5 MG tablet Take 1 tablet (2.5 mg total) by mouth daily. 90 tablet 3   gabapentin  (NEURONTIN ) 300 MG capsule Take 1 capsule (300 mg total) by mouth at bedtime. 90 capsule 1   glimepiride  (AMARYL ) 4 MG tablet Take 2 tablets (8 mg total) by mouth daily with breakfast. 180 tablet 0   metFORMIN  (GLUCOPHAGE ) 500 MG tablet Take 1 tablet (500 mg total) by mouth 2 (two) times daily with a meal. 180 tablet 1   rosuvastatin  (CRESTOR ) 40 MG tablet Take 1 tablet (40 mg total) by mouth daily. 90 tablet 0   tiZANidine  (ZANAFLEX ) 4 MG tablet Take 1 tablet (4 mg total) by mouth every 8 (eight) hours as needed for muscle spasms. 90 tablet 0   lidocaine  (LIDODERM ) 5 % Place 1 patch onto the skin daily. Remove & Discard patch within 12 hours or as directed by MD (Patient not taking: Reported on 11/29/2023) 30 patch 0   No facility-administered medications prior to visit.    Allergies  Allergen Reactions   Aspirin Anaphylaxis, Swelling and Rash    Throat closes    Ibuprofen Anaphylaxis, Swelling and Rash       Objective:    BP 139/88 (BP Location: Left Arm, Patient Position: Sitting, Cuff Size: Large)   Pulse 75   Resp 19   Ht 6' 3 (1.905 m)   Wt (!) 308 lb 9.6 oz (140 kg)   SpO2 100%   BMI 38.57 kg/m  Wt Readings from Last 3 Encounters:  11/29/23 (!) 308 lb 9.6 oz (140 kg)  09/15/23 (!) 308 lb 1.6 oz (139.8 kg)  08/30/23 (!) 306 lb 6.4 oz (139 kg)    Physical Exam Vitals and nursing note reviewed.  Constitutional:      Appearance: He is well-developed.  HENT:     Head: Normocephalic and atraumatic.  Cardiovascular:     Rate and Rhythm: Normal rate and  regular rhythm.  Heart sounds: Normal heart sounds. No murmur heard.    No friction rub. No gallop.  Pulmonary:     Effort: Pulmonary effort is normal. No tachypnea or respiratory distress.     Breath sounds: Normal breath sounds. No decreased breath sounds, wheezing, rhonchi or rales.  Chest:     Chest wall: No tenderness.  Musculoskeletal:        General: Normal range of motion.     Cervical back: Normal range of motion.  Skin:    General: Skin is warm and dry.  Neurological:     Mental Status: He is alert and oriented to person, place, and time.     Coordination: Coordination normal.  Psychiatric:        Behavior: Behavior normal. Behavior is cooperative.        Thought Content: Thought content normal.        Judgment: Judgment normal.          Patient has been counseled extensively about nutrition and exercise as well as the importance of adherence with medications and regular follow-up. The patient was given clear instructions to go to ER or return to medical center if symptoms don't improve, worsen or new problems develop. The patient verbalized understanding.   Follow-up: Return in about 4 months (around 03/31/2024).   Haze LELON Servant, FNP-BC [facility] [place], [place] [phone]   11/29/2023, 11:47 AM

## 2023-11-30 LAB — CMP14+EGFR
ALT: 29 IU/L (ref 0–44)
AST: 24 IU/L (ref 0–40)
Albumin: 4.4 g/dL (ref 3.9–4.9)
Alkaline Phosphatase: 74 IU/L (ref 44–121)
BUN/Creatinine Ratio: 11 (ref 10–24)
BUN: 13 mg/dL (ref 8–27)
Bilirubin Total: 0.6 mg/dL (ref 0.0–1.2)
CO2: 17 mmol/L — ABNORMAL LOW (ref 20–29)
Calcium: 9 mg/dL (ref 8.6–10.2)
Chloride: 104 mmol/L (ref 96–106)
Creatinine, Ser: 1.17 mg/dL (ref 0.76–1.27)
Globulin, Total: 2.4 g/dL (ref 1.5–4.5)
Glucose: 153 mg/dL — ABNORMAL HIGH (ref 70–99)
Potassium: 4.6 mmol/L (ref 3.5–5.2)
Sodium: 138 mmol/L (ref 134–144)
Total Protein: 6.8 g/dL (ref 6.0–8.5)
eGFR: 70 mL/min/1.73 (ref >59)

## 2023-11-30 LAB — LIPID PANEL
Chol/HDL Ratio: 2.7 ratio (ref 0.0–5.0)
Cholesterol, Total: 113 mg/dL (ref 100–199)
HDL: 42 mg/dL (ref >39)
LDL Chol Calc (NIH): 51 mg/dL (ref 0–99)
Triglycerides: 110 mg/dL (ref 0–149)
VLDL Cholesterol Cal: 20 mg/dL (ref 5–40)

## 2023-11-30 LAB — HEMOGLOBIN A1C
Est. average glucose Bld gHb Est-mCnc: 146 mg/dL
Hgb A1c MFr Bld: 6.7 % — ABNORMAL HIGH (ref 4.8–5.6)

## 2023-12-08 ENCOUNTER — Other Ambulatory Visit: Payer: Self-pay

## 2023-12-16 ENCOUNTER — Ambulatory Visit: Admitting: Orthopaedic Surgery

## 2023-12-16 DIAGNOSIS — M170 Bilateral primary osteoarthritis of knee: Secondary | ICD-10-CM

## 2023-12-16 DIAGNOSIS — M1712 Unilateral primary osteoarthritis, left knee: Secondary | ICD-10-CM | POA: Insufficient documentation

## 2023-12-16 DIAGNOSIS — M1711 Unilateral primary osteoarthritis, right knee: Secondary | ICD-10-CM | POA: Insufficient documentation

## 2023-12-16 MED ORDER — METHYLPREDNISOLONE ACETATE 40 MG/ML IJ SUSP
40.0000 mg | INTRAMUSCULAR | Status: AC | PRN
  Administered 2023-12-16: 40 mg via INTRA_ARTICULAR

## 2023-12-16 MED ORDER — LIDOCAINE HCL 1 % IJ SOLN
2.0000 mL | INTRAMUSCULAR | Status: AC | PRN
Start: 2023-12-16 — End: 2023-12-16
  Administered 2023-12-16: 2 mL

## 2023-12-16 MED ORDER — BUPIVACAINE HCL 0.5 % IJ SOLN
2.0000 mL | INTRAMUSCULAR | Status: AC | PRN
  Administered 2023-12-16: 2 mL via INTRA_ARTICULAR

## 2023-12-16 NOTE — Progress Notes (Signed)
 Office Visit Note   Patient: William Young           Date of Birth: Mar 25, 1961           MRN: [id number] Visit Date: 12/16/2023              Requested by: Theotis Haze ORN, NP [address] [place] [address] [place],  [place] [address] PCP: Theotis Haze ORN, NP   Assessment & Plan: Visit Diagnoses:  1. Primary osteoarthritis of right knee   2. Primary osteoarthritis of left knee     Plan: History of Present Illness William Young is a 63 year old male with arthritis who presents with worsening bilateral knee pain.  Over the past two years, he has experienced worsening pain in both knees, described as a grinding sensation. Cortisone injections provided relief, with the last injection nearly a year ago. Currently, both knees remain painful, and he also experiences pain in his left hip.  He has attended physical therapy but finds it challenging to maintain regular sessions due to his work schedule. He enjoys golfing but has been less active recently.  His weight was recorded at 308 pounds during his last visit, and he has since lost a few pounds, now weighing 305 pounds. He manages diabetes with medication, with a last A1c of 6.7.  He uses a copper fit knee brace occasionally, particularly when engaging in activities like golf.  Physical Exam Exam of bilateral knees show functional range of motion with crepitus.  Collaterals and cruciates are stable.  Normal strength and sensation.  Results LABS A1c: 6.7  Assessment and Plan Bilateral knee osteoarthritis Progressive symptoms with grinding and pain. X-rays show arthritic changes without bone-on-bone contact. Weight significantly impacts knee pain. - Administer knee injections. - Encourage weight loss. - Recommend physical therapy and strengthening exercises. - Advise use of knee braces during prolonged activity. - Schedule follow-up appointment for left hip evaluation.  Follow-Up Instructions: No follow-ups on file.   Orders:   No orders of the defined types were placed in this encounter.  No orders of the defined types were placed in this encounter.     Procedures: Large Joint Inj: bilateral knee on 12/16/2023 10:10 AM Indications: pain Details: 22 G needle  Arthrogram: No  Medications (Right): 2 mL lidocaine  1 %; 2 mL bupivacaine  0.5 %; 40 mg methylPREDNISolone  acetate 40 MG/ML Medications (Left): 2 mL lidocaine  1 %; 2 mL bupivacaine  0.5 %; 40 mg methylPREDNISolone  acetate 40 MG/ML Outcome: tolerated well, no immediate complications Patient was prepped and draped in the usual sterile fashion.       Clinical Data: No additional findings.   Subjective: Chief Complaint  Patient presents with   Right Knee - Pain   Left Knee - Pain    HPI  Review of Systems  Constitutional: Negative.   HENT: Negative.    Eyes: Negative.   Respiratory: Negative.    Cardiovascular: Negative.   Gastrointestinal: Negative.   Endocrine: Negative.   Genitourinary: Negative.   Skin: Negative.   Allergic/Immunologic: Negative.   Neurological: Negative.   Hematological: Negative.   Psychiatric/Behavioral: Negative.    All other systems reviewed and are negative.    Objective: Vital Signs: There were no vitals taken for this visit.  Physical Exam Vitals and nursing note reviewed.  Constitutional:      Appearance: He is well-developed.  HENT:     Head: Normocephalic and atraumatic.  Eyes:     Pupils: Pupils are equal, round, and  reactive to light.  Pulmonary:     Effort: Pulmonary effort is normal.  Abdominal:     Palpations: Abdomen is soft.  Musculoskeletal:        General: Normal range of motion.     Cervical back: Neck supple.  Skin:    General: Skin is warm.  Neurological:     Mental Status: He is alert and oriented to person, place, and time.  Psychiatric:        Behavior: Behavior normal.        Thought Content: Thought content normal.        Judgment: Judgment normal.     Ortho  Exam  Specialty Comments:  No specialty comments available.  Imaging: No results found.   PMFS History: Patient Active Problem List   Diagnosis Date Noted   Primary osteoarthritis of right knee 12/16/2023   Primary osteoarthritis of left knee 12/16/2023   Chondromalacia of knee, left 02/02/2023   Low back pain 02/02/2023   AKI (acute kidney injury) (HCC) 04/22/2022   Type 2 diabetes mellitus with complication, without long-term current use of insulin (HCC) 04/22/2022   Hypotension 03/30/2022   COPD (chronic obstructive pulmonary disease) (HCC) 11/27/2021   SOB (shortness of breath) 09/03/2021   Pulmonary nodules 07/12/2020   Peripheral arterial disease (HCC)    Chronic diastolic heart failure (HCC) 07/06/2020   Aortic atherosclerosis (HCC) 05/26/2020   AAA (abdominal aortic aneurysm) without rupture (HCC) 05/26/2020   Thrombocytopenia (HCC) 08/13/2017   Dizziness    Precordial pain    History of coronary artery disease    Varicose veins of right lower extremity with complications 10/27/2016   Hyperlipidemia LDL goal <70 09/28/2014   Obstructive sleep apnea 09/14/2014   Morbid obesity (HCC) 09/12/2014   Coronary artery disease involving native coronary artery with angina pectoris (HCC)    Hx of NSTEMI 5/16 tx with BMS to pRCA and DES to RPLB3 09/10/2014   Essential hypertension 09/10/2014   Tobacco use    GERD (gastroesophageal reflux disease)    Past Medical History:  Diagnosis Date   Abdominal aortic aneurysm (AAA) (HCC)    CT 1/22: infrarenal 3.8 cm - repeat US  in 2 years // AAA US  2/22: AAA 3.8 cm; aorto iliac atherosclerosis without hemodynamically significant stenosis   Aortic atherosclerosis (HCC)    CT in 03/2020   CAD (coronary artery disease) 09/2014   a. NSTEMI >> LHC 5/16:  mLAD 15, pRCA 90, mRCA 40, RPLB3 80, EF normal with inf HK >> PCI:  BMS to pRCA and DES to RPLB3 // Cath 11/21: Patent RCA stent, patent RPL stent; medical therapy    GERD  (gastroesophageal reflux disease)    History of echocardiogram    Echo 6/16:  EF 50-55%, no RWMA, Gr 1 DD   HLD (hyperlipidemia)    thelbert 03/15/2017   HTN (hypertension)    Hx of NSTEMI 5/16 tx with BMS to pRCA and DES to RPLB3 09/10/2014   Hyperglycemia    OSA (obstructive sleep apnea)    dx'd; couldn't tolerate mask (03/15/2017)   PAD (peripheral artery disease) (HCC)    AAA US  in 06/2020: aorto iliac atherosclerosis   Tobacco use    Type II diabetes mellitus (HCC)    Varicose veins of both lower extremities    S/P ablation 02/2017    Family History  Problem Relation Age of Onset   Heart attack Maternal Grandmother    Cancer Maternal Grandfather    Heart attack Paternal Grandmother  Heart attack Maternal Uncle    Stroke Neg Hx    Hypertension Neg Hx    Pancreatic cancer Neg Hx    Stomach cancer Neg Hx    Esophageal cancer Neg Hx    Colon cancer Neg Hx     Past Surgical History:  Procedure Laterality Date   CARDIAC CATHETERIZATION N/A 09/11/2014   Procedure: Left Heart Cath and Coronary Angiography;  Surgeon: Alm LELON Clay, MD;  Location: [facility] INVASIVE CV LAB CUPID;  Service: Cardiovascular;  Laterality: N/A;   CARDIAC CATHETERIZATION  09/11/2014   Procedure: Coronary Stent Intervention;  Surgeon: Alm LELON Clay, MD;  Location: [facility] INVASIVE CV LAB CUPID;  Service: Cardiovascular;;   ENDOVENOUS ABLATION SAPHENOUS VEIN W/ LASER Right 02/22/2017   endovenous laser ablation R GSV and stab phlebectomy >20 incisions R leg by Lynwood Collum MD    LEFT HEART CATH AND CORONARY ANGIOGRAPHY N/A 04/01/2020   Procedure: LEFT HEART CATH AND CORONARY ANGIOGRAPHY;  Surgeon: Court Dorn PARAS, MD;  Location: [facility] INVASIVE CV LAB;  Service: Cardiovascular;  Laterality: N/A;   Social History   Occupational History   Occupation: Service water purification systems  Tobacco Use   Smoking status: Some Days    Current packs/day: 1.00    Average packs/day: 1 pack/day for 47.6 years (47.6 ttl  pk-yrs)    Types: Cigarettes    Start date: 1978   Smokeless tobacco: Former    Types: Chew   Tobacco comments:    Smokes 5 packs a week. Tay 11/27/21  Vaping Use   Vaping status: Never Used  Substance and Sexual Activity   Alcohol use: Yes    Alcohol/week: 6.0 standard drinks of alcohol    Types: 6 Cans of beer per week    Comment: daily   Drug use: No   Sexual activity: Not Currently

## 2023-12-18 ENCOUNTER — Ambulatory Visit: Payer: Self-pay | Admitting: Nurse Practitioner

## 2024-01-07 ENCOUNTER — Other Ambulatory Visit: Payer: Self-pay

## 2024-01-11 ENCOUNTER — Other Ambulatory Visit: Payer: Self-pay

## 2024-01-11 ENCOUNTER — Other Ambulatory Visit: Payer: Self-pay | Admitting: Nurse Practitioner

## 2024-01-11 DIAGNOSIS — G8929 Other chronic pain: Secondary | ICD-10-CM

## 2024-01-11 MED ORDER — ACETAMINOPHEN-CODEINE 300-30 MG PO TABS
1.0000 | ORAL_TABLET | Freq: Every day | ORAL | 0 refills | Status: DC | PRN
  Filled 2024-01-11: qty 30, 30d supply, fill #0

## 2024-01-12 ENCOUNTER — Other Ambulatory Visit: Payer: Self-pay

## 2024-01-17 ENCOUNTER — Other Ambulatory Visit: Payer: Self-pay

## 2024-02-09 ENCOUNTER — Other Ambulatory Visit: Payer: Self-pay

## 2024-02-10 ENCOUNTER — Other Ambulatory Visit: Payer: Self-pay

## 2024-02-10 ENCOUNTER — Ambulatory Visit: Attending: Internal Medicine | Admitting: Internal Medicine

## 2024-02-10 ENCOUNTER — Encounter: Payer: Self-pay | Admitting: Internal Medicine

## 2024-02-10 VITALS — BP 130/76 | HR 77 | Ht 75.0 in | Wt 306.0 lb

## 2024-02-10 DIAGNOSIS — I739 Peripheral vascular disease, unspecified: Secondary | ICD-10-CM | POA: Diagnosis not present

## 2024-02-10 DIAGNOSIS — I25119 Atherosclerotic heart disease of native coronary artery with unspecified angina pectoris: Secondary | ICD-10-CM | POA: Insufficient documentation

## 2024-02-10 DIAGNOSIS — I10 Essential (primary) hypertension: Secondary | ICD-10-CM | POA: Diagnosis present

## 2024-02-10 MED ORDER — NITROGLYCERIN 0.4 MG SL SUBL
0.4000 mg | SUBLINGUAL_TABLET | SUBLINGUAL | 5 refills | Status: AC | PRN
  Filled 2024-02-10: qty 25, 30d supply, fill #0

## 2024-02-10 NOTE — Patient Instructions (Signed)
 Medication Instructions:  Your physician recommends that you continue on your current medications as directed. Please refer to the Current Medication list given to you today.  *If you need a refill on your cardiac medications before your next appointment, please call your pharmacy*  Follow-Up: At [facility], you and your health needs are our priority.  As part of our continuing mission to provide you with exceptional heart care, our providers are all part of one team.  This team includes your primary Cardiologist (physician) and Advanced Practice Providers or APPs (Physician Assistants and Nurse Practitioners) who all work together to provide you with the care you need, when you need it.  Your next appointment:   6-7 month(s)  Provider:   Vina Gull, MD   We recommend signing up for the patient portal called MyChart.  Sign up information is provided on this After Visit Summary.  MyChart is used to connect with patients for Virtual Visits (Telemedicine).  Patients are able to view lab/test results, encounter notes, upcoming appointments, etc.  Non-urgent messages can be sent to your provider as well.    To learn more about what you can do with MyChart, go to [email or web address].   Other Instructions

## 2024-02-10 NOTE — Progress Notes (Unsigned)
 Cardiology Office Note   Date:  02/10/2024   ID:  William Young, DOB 06-12-1960, MRN [id number]  PCP:  Theotis Haze ORN, NP  Cardiologist:   Vina Gull, MD   F/U of CAD    History of Present Illness: William Young is a 63 y.o. male with a history of CAD, DM, GERD, HTN, HL, AAA (USN 3.8 cm March 2023), COPD, OSA, venous insufficency   May 2016:  NSTEMI  LHC  showed single vessel CAD with 90% prox RCA and 80% R PLSA.  Underwent PCI/BMS to prox RCA and resolute DES to RPLB3.  Hx of ASA allergy so treated  With Brilinta  for 1 year then continued on plavix .     Nov 2018  Pt admitted  with CP  Myovue post discharge showed no ischemia   LVEF 38%  Echo showed LVEF 55 to 60^ with inferolateral hypokinesis  2021:  LHC showed patent RCA, RPL stents       May 2023   Myoview  showed no ischemia or infarct   LVEF 47% TTE   LVEF 50 to 55% (may 2023)    Nov 2023 the pt  was hypotensive  Developed renal insuff  Cr 2.54.  This improved to 1.01 by end of November   I saw the pt in Dec 2024  USN in May 2025 showed AAA measuring 4.5 cm    Mod PAD  The pt has had occasional episdoes of chest discomfort   NOt associated with activity    Resolves on own    Has not taken NTG for it (old) Had one episode of heart racing Brief  No dizziness   Current Meds  Medication Sig   acetaminophen -codeine  (TYLENOL  #3) 300-30 MG tablet Take 1 tablet by mouth daily as needed for moderate pain (pain score 4-6).   amLODipine  (NORVASC ) 5 MG tablet Take 1 tablet (5 mg total) by mouth daily.   clopidogrel  (PLAVIX ) 75 MG tablet Take 1 tablet (75 mg total) by mouth daily.   empagliflozin  (JARDIANCE ) 25 MG TABS tablet Take 1 tablet (25 mg total) by mouth daily before breakfast.   ezetimibe  (ZETIA ) 10 MG tablet Take 1 tablet (10 mg total) by mouth daily.   fluticasone  furoate-vilanterol (BREO ELLIPTA ) 200-25 MCG/ACT AEPB Inhale 1 puff into the lungs daily.   gabapentin  (NEURONTIN ) 300 MG capsule Take 1 capsule (300 mg  total) by mouth at bedtime.   glimepiride  (AMARYL ) 4 MG tablet Take 2 tablets (8 mg total) by mouth daily with breakfast.   lidocaine  (LIDODERM ) 5 % Place 1 patch onto the skin daily. Remove & Discard patch within 12 hours or as directed by MD   losartan  (COZAAR ) 50 MG tablet Take 1 tablet (50 mg total) by mouth daily.   metFORMIN  (GLUCOPHAGE ) 500 MG tablet Take 1 tablet (500 mg total) by mouth 2 (two) times daily with a meal.   nitroGLYCERIN  (NITROSTAT ) 0.4 MG SL tablet Place 1 tablet (0.4 mg total) under the tongue every 5 (five) minutes x 3 doses as needed for chest pain.   rosuvastatin  (CRESTOR ) 40 MG tablet Take 1 tablet (40 mg total) by mouth daily.   tiZANidine  (ZANAFLEX ) 4 MG tablet Take 1 tablet (4 mg total) by mouth every 8 (eight) hours as needed for muscle spasms.   Vitamin A  7.5 MG (25000 UT) CAPS Take 50,000 Units by mouth once a week.     Allergies:   Aspirin and Ibuprofen   Past Medical History:  Diagnosis Date  Abdominal aortic aneurysm (AAA)    CT 1/22: infrarenal 3.8 cm - repeat US  in 2 years // AAA US  2/22: AAA 3.8 cm; aorto iliac atherosclerosis without hemodynamically significant stenosis   Aortic atherosclerosis    CT in 03/2020   CAD (coronary artery disease) 09/2014   a. NSTEMI >> LHC 5/16:  mLAD 15, pRCA 90, mRCA 40, RPLB3 80, EF normal with inf HK >> PCI:  BMS to pRCA and DES to RPLB3 // Cath 11/21: Patent RCA stent, patent RPL stent; medical therapy    GERD (gastroesophageal reflux disease)    History of echocardiogram    Echo 6/16:  EF 50-55%, no RWMA, Gr 1 DD   HLD (hyperlipidemia)    thelbert 03/15/2017   HTN (hypertension)    Hx of NSTEMI 5/16 tx with BMS to pRCA and DES to RPLB3 09/10/2014   Hyperglycemia    OSA (obstructive sleep apnea)    dx'd; couldn't tolerate mask (03/15/2017)   PAD (peripheral artery disease)    AAA US  in 06/2020: aorto iliac atherosclerosis   Tobacco use    Type II diabetes mellitus (HCC)    Varicose veins of both lower  extremities    S/P ablation 02/2017    Past Surgical History:  Procedure Laterality Date   CARDIAC CATHETERIZATION N/A 09/11/2014   Procedure: Left Heart Cath and Coronary Angiography;  Surgeon: Alm LELON Clay, MD;  Location: [facility] INVASIVE CV LAB CUPID;  Service: Cardiovascular;  Laterality: N/A;   CARDIAC CATHETERIZATION  09/11/2014   Procedure: Coronary Stent Intervention;  Surgeon: Alm LELON Clay, MD;  Location: [facility] INVASIVE CV LAB CUPID;  Service: Cardiovascular;;   ENDOVENOUS ABLATION SAPHENOUS VEIN W/ LASER Right 02/22/2017   endovenous laser ablation R GSV and stab phlebectomy >20 incisions R leg by Lynwood Collum MD    LEFT HEART CATH AND CORONARY ANGIOGRAPHY N/A 04/01/2020   Procedure: LEFT HEART CATH AND CORONARY ANGIOGRAPHY;  Surgeon: Court Dorn PARAS, MD;  Location: [facility] INVASIVE CV LAB;  Service: Cardiovascular;  Laterality: N/A;     Social History:  The patient  reports that he has been smoking cigarettes. He started smoking about 47 years ago. He has a 47.8 pack-year smoking history. He has quit using smokeless tobacco.  His smokeless tobacco use included chew. He reports current alcohol use of about 6.0 standard drinks of alcohol per week. He reports that he does not use drugs.   Family History:  The patient's family history includes Cancer in his maternal grandfather; Heart attack in his maternal grandmother, maternal uncle, and paternal grandmother.    ROS:  Please see the history of present illness. All other systems are reviewed and  Negative to the above problem except as noted.    PHYSICAL EXAM: VS:  BP 130/76   Pulse 77   Ht 6' 3 (1.905 m)   Wt (!) 306 lb (138.8 kg)   SpO2 96%   BMI 38.25 kg/m   GEN: Morbidly obese 63 yo in no acute distress  HEENT: normal  Neck: Neck is full  No bruits Cardiac: RRR; no murmurs   Respiratory:  clear to auscultation GI: soft, obese No hepatomegaly  Ext  No LE edema   EKG:  EKG shows SR 77 bpm  LAFB  Lipid Panel    Component  Value Date/Time   CHOL 113 11/29/2023 1110   TRIG 110 11/29/2023 1110   HDL 42 11/29/2023 1110   CHOLHDL 2.7 11/29/2023 1110   CHOLHDL 6.8 03/16/2017 0439  VLDL 33 03/16/2017 0439   LDLCALC 51 11/29/2023 1110      Wt Readings from Last 3 Encounters:  02/10/24 (!) 306 lb (138.8 kg)  11/29/23 (!) 308 lb 9.6 oz (140 kg)  09/15/23 (!) 308 lb 1.6 oz (139.8 kg)      ASSESSMENT AND PLAN:  1  CAD Pt s/p PTCA/stent in 2016   Last myoview  in 2023 showed no ischemia Pt with occsaional chest pains   Does not appear to be progressive    Rx NTG prn Call if worsens  2  HTN  BP is well controlled    Keep on amlodipine    3   AAA  Follow with periodic scans  4   HL   In July 2025 LDL 51  HDL 42    Keep on cCrestor and Zetia   5  DM   Last A1C in July 2025 was 6.7  Reviewed diet   Cut out carbs  Follow up in Spring 2026    Current medicines are reviewed at length with the patient today.  The patient does not have concerns regarding medicines.  Signed, Vina Gull, MD

## 2024-02-15 ENCOUNTER — Other Ambulatory Visit: Payer: Self-pay

## 2024-02-24 ENCOUNTER — Other Ambulatory Visit: Payer: Self-pay

## 2024-02-29 ENCOUNTER — Other Ambulatory Visit: Payer: Self-pay

## 2024-02-29 ENCOUNTER — Encounter: Payer: Self-pay | Admitting: Acute Care

## 2024-03-10 ENCOUNTER — Other Ambulatory Visit: Payer: Self-pay

## 2024-03-13 ENCOUNTER — Encounter: Payer: Self-pay | Admitting: Radiology

## 2024-03-15 ENCOUNTER — Other Ambulatory Visit: Payer: Self-pay | Admitting: Nurse Practitioner

## 2024-03-15 ENCOUNTER — Other Ambulatory Visit: Payer: Self-pay

## 2024-03-15 DIAGNOSIS — G8929 Other chronic pain: Secondary | ICD-10-CM

## 2024-03-15 MED ORDER — ACETAMINOPHEN-CODEINE 300-30 MG PO TABS
1.0000 | ORAL_TABLET | Freq: Every day | ORAL | 0 refills | Status: DC | PRN
  Filled 2024-03-15: qty 30, 30d supply, fill #0

## 2024-03-16 ENCOUNTER — Other Ambulatory Visit: Payer: Self-pay

## 2024-03-23 ENCOUNTER — Ambulatory Visit (INDEPENDENT_AMBULATORY_CARE_PROVIDER_SITE_OTHER): Admitting: Orthopaedic Surgery

## 2024-03-23 DIAGNOSIS — M1712 Unilateral primary osteoarthritis, left knee: Secondary | ICD-10-CM

## 2024-03-23 DIAGNOSIS — M170 Bilateral primary osteoarthritis of knee: Secondary | ICD-10-CM | POA: Diagnosis not present

## 2024-03-23 DIAGNOSIS — M1711 Unilateral primary osteoarthritis, right knee: Secondary | ICD-10-CM

## 2024-03-23 MED ORDER — METHYLPREDNISOLONE ACETATE 40 MG/ML IJ SUSP
40.0000 mg | INTRAMUSCULAR | Status: AC | PRN
  Administered 2024-03-23: 40 mg via INTRA_ARTICULAR

## 2024-03-23 MED ORDER — BUPIVACAINE HCL 0.5 % IJ SOLN
2.0000 mL | INTRAMUSCULAR | Status: AC | PRN
  Administered 2024-03-23: 2 mL via INTRA_ARTICULAR

## 2024-03-23 MED ORDER — LIDOCAINE HCL 1 % IJ SOLN
2.0000 mL | INTRAMUSCULAR | Status: AC | PRN
  Administered 2024-03-23: 2 mL

## 2024-03-23 NOTE — Progress Notes (Signed)
 Office Visit Note   Patient: William Young           Date of Birth: 09-20-1960           MRN: [id number] Visit Date: 03/23/2024              Requested by: Theotis Haze ORN, NP [address] [place] [address] [place],  [place] [address] PCP: Theotis Haze ORN, NP   Assessment & Plan: Visit Diagnoses:  1. Primary osteoarthritis of right knee   2. Primary osteoarthritis of left knee     Plan: History of Present Illness William Young is a 63 year old male who presents with bilateral knee pain after previous cortisone injections.  He received cortisone injections three months ago, which provided significant relief for a couple of months, but the knee pain has since returned. He experiences significant pain in his right hip, causing discomfort with movement. His weight remains stable at approximately 303 to 305 pounds.  Physical Exam MEASUREMENTS: Weight- 305. Examination bilateral knees are unchanged from prior visit.  Assessment and Plan Bilateral knee pain Chronic pain managed with cortisone injections. Emphasized weight loss to reduce joint stress and prevent surgery. - Administered cortisone injections - Discussed weight loss strategies.  Follow-Up Instructions: No follow-ups on file.   Orders:  No orders of the defined types were placed in this encounter.  No orders of the defined types were placed in this encounter.     Procedures: Large Joint Inj: bilateral knee on 03/23/2024 8:33 PM Indications: pain Details: 22 G needle  Arthrogram: No  Medications (Right): 2 mL lidocaine  1 %; 2 mL bupivacaine  0.5 %; 40 mg methylPREDNISolone  acetate 40 MG/ML Medications (Left): 2 mL lidocaine  1 %; 2 mL bupivacaine  0.5 %; 40 mg methylPREDNISolone  acetate 40 MG/ML Outcome: tolerated well, no immediate complications Patient was prepped and draped in the usual sterile fashion.       Clinical Data: No additional findings.   Subjective: Chief Complaint  Patient presents  with   Right Knee - Pain   Left Knee - Pain    HPI  Review of Systems  Constitutional: Negative.   HENT: Negative.    Eyes: Negative.   Respiratory: Negative.    Cardiovascular: Negative.   Gastrointestinal: Negative.   Endocrine: Negative.   Genitourinary: Negative.   Skin: Negative.   Allergic/Immunologic: Negative.   Neurological: Negative.   Hematological: Negative.   Psychiatric/Behavioral: Negative.    All other systems reviewed and are negative.    Objective: Vital Signs: There were no vitals taken for this visit.  Physical Exam Vitals and nursing note reviewed.  Constitutional:      Appearance: He is well-developed.  HENT:     Head: Normocephalic and atraumatic.  Eyes:     Pupils: Pupils are equal, round, and reactive to light.  Pulmonary:     Effort: Pulmonary effort is normal.  Abdominal:     Palpations: Abdomen is soft.  Musculoskeletal:        General: Normal range of motion.     Cervical back: Neck supple.  Skin:    General: Skin is warm.  Neurological:     Mental Status: He is alert and oriented to person, place, and time.  Psychiatric:        Behavior: Behavior normal.        Thought Content: Thought content normal.        Judgment: Judgment normal.     Ortho Exam  Specialty Comments:  No  specialty comments available.  Imaging: No results found.   PMFS History: Patient Active Problem List   Diagnosis Date Noted   Primary osteoarthritis of right knee 12/16/2023   Primary osteoarthritis of left knee 12/16/2023   Chondromalacia of knee, left 02/02/2023   Low back pain 02/02/2023   AKI (acute kidney injury) 04/22/2022   Type 2 diabetes mellitus with complication, without long-term current use of insulin (HCC) 04/22/2022   Hypotension 03/30/2022   COPD (chronic obstructive pulmonary disease) (HCC) 11/27/2021   SOB (shortness of breath) 09/03/2021   Pulmonary nodules 07/12/2020   Peripheral arterial disease    Chronic diastolic  heart failure (HCC) 97/73/7977   Aortic atherosclerosis 05/26/2020   AAA (abdominal aortic aneurysm) without rupture 05/26/2020   Thrombocytopenia 08/13/2017   Dizziness    Precordial pain    History of coronary artery disease    Varicose veins of right lower extremity with complications 10/27/2016   Hyperlipidemia LDL goal <70 09/28/2014   Obstructive sleep apnea 09/14/2014   Morbid obesity (HCC) 09/12/2014   Coronary artery disease involving native coronary artery with angina pectoris    Hx of NSTEMI 5/16 tx with BMS to pRCA and DES to RPLB3 09/10/2014   Essential hypertension 09/10/2014   Tobacco use    GERD (gastroesophageal reflux disease)    Past Medical History:  Diagnosis Date   Abdominal aortic aneurysm (AAA)    CT 1/22: infrarenal 3.8 cm - repeat US  in 2 years // AAA US  2/22: AAA 3.8 cm; aorto iliac atherosclerosis without hemodynamically significant stenosis   Aortic atherosclerosis    CT in 03/2020   CAD (coronary artery disease) 09/2014   a. NSTEMI >> LHC 5/16:  mLAD 15, pRCA 90, mRCA 40, RPLB3 80, EF normal with inf HK >> PCI:  BMS to pRCA and DES to RPLB3 // Cath 11/21: Patent RCA stent, patent RPL stent; medical therapy    GERD (gastroesophageal reflux disease)    History of echocardiogram    Echo 6/16:  EF 50-55%, no RWMA, Gr 1 DD   HLD (hyperlipidemia)    thelbert 03/15/2017   HTN (hypertension)    Hx of NSTEMI 5/16 tx with BMS to pRCA and DES to RPLB3 09/10/2014   Hyperglycemia    OSA (obstructive sleep apnea)    dx'd; couldn't tolerate mask (03/15/2017)   PAD (peripheral artery disease)    AAA US  in 06/2020: aorto iliac atherosclerosis   Tobacco use    Type II diabetes mellitus (HCC)    Varicose veins of both lower extremities    S/P ablation 02/2017    Family History  Problem Relation Age of Onset   Heart attack Maternal Grandmother    Cancer Maternal Grandfather    Heart attack Paternal Grandmother    Heart attack Maternal Uncle    Stroke Neg Hx     Hypertension Neg Hx    Pancreatic cancer Neg Hx    Stomach cancer Neg Hx    Esophageal cancer Neg Hx    Colon cancer Neg Hx     Past Surgical History:  Procedure Laterality Date   CARDIAC CATHETERIZATION N/A 09/11/2014   Procedure: Left Heart Cath and Coronary Angiography;  Surgeon: Alm LELON Clay, MD;  Location: [facility] INVASIVE CV LAB CUPID;  Service: Cardiovascular;  Laterality: N/A;   CARDIAC CATHETERIZATION  09/11/2014   Procedure: Coronary Stent Intervention;  Surgeon: Alm LELON Clay, MD;  Location: [facility] INVASIVE CV LAB CUPID;  Service: Cardiovascular;;   ENDOVENOUS ABLATION SAPHENOUS VEIN W/  LASER Right 02/22/2017   endovenous laser ablation R GSV and stab phlebectomy >20 incisions R leg by Lynwood Collum MD    LEFT HEART CATH AND CORONARY ANGIOGRAPHY N/A 04/01/2020   Procedure: LEFT HEART CATH AND CORONARY ANGIOGRAPHY;  Surgeon: Court Dorn PARAS, MD;  Location: [facility] INVASIVE CV LAB;  Service: Cardiovascular;  Laterality: N/A;   Social History   Occupational History   Occupation: Service water purification systems  Tobacco Use   Smoking status: Every Day    Current packs/day: 1.00    Average packs/day: 1 pack/day for 47.9 years (47.9 ttl pk-yrs)    Types: Cigarettes    Start date: 1978   Smokeless tobacco: Former    Types: Chew   Tobacco comments:    Smokes 5 packs a week. Tay 11/27/21  Vaping Use   Vaping status: Never Used  Substance and Sexual Activity   Alcohol use: Yes    Alcohol/week: 6.0 standard drinks of alcohol    Types: 6 Cans of beer per week    Comment: daily   Drug use: No   Sexual activity: Not Currently

## 2024-03-29 ENCOUNTER — Telehealth: Payer: Self-pay | Admitting: Nurse Practitioner

## 2024-03-29 NOTE — Telephone Encounter (Signed)
 PT CONFIRMED APPT (PER VR)

## 2024-03-31 ENCOUNTER — Other Ambulatory Visit: Payer: Self-pay

## 2024-03-31 ENCOUNTER — Encounter: Payer: Self-pay | Admitting: Nurse Practitioner

## 2024-03-31 ENCOUNTER — Ambulatory Visit: Attending: Nurse Practitioner | Admitting: Nurse Practitioner

## 2024-03-31 VITALS — BP 127/84 | HR 79 | Resp 19 | Ht 75.0 in | Wt 303.8 lb

## 2024-03-31 DIAGNOSIS — M25552 Pain in left hip: Secondary | ICD-10-CM

## 2024-03-31 DIAGNOSIS — J069 Acute upper respiratory infection, unspecified: Secondary | ICD-10-CM

## 2024-03-31 DIAGNOSIS — E559 Vitamin D deficiency, unspecified: Secondary | ICD-10-CM

## 2024-03-31 DIAGNOSIS — E118 Type 2 diabetes mellitus with unspecified complications: Secondary | ICD-10-CM

## 2024-03-31 DIAGNOSIS — Z7182 Exercise counseling: Secondary | ICD-10-CM

## 2024-03-31 DIAGNOSIS — M25551 Pain in right hip: Secondary | ICD-10-CM | POA: Diagnosis not present

## 2024-03-31 DIAGNOSIS — I10 Essential (primary) hypertension: Secondary | ICD-10-CM

## 2024-03-31 DIAGNOSIS — G8929 Other chronic pain: Secondary | ICD-10-CM

## 2024-03-31 DIAGNOSIS — Z7984 Long term (current) use of oral hypoglycemic drugs: Secondary | ICD-10-CM

## 2024-03-31 DIAGNOSIS — Z713 Dietary counseling and surveillance: Secondary | ICD-10-CM

## 2024-03-31 DIAGNOSIS — Z7985 Long-term (current) use of injectable non-insulin antidiabetic drugs: Secondary | ICD-10-CM

## 2024-03-31 MED ORDER — AMLODIPINE BESYLATE 5 MG PO TABS
5.0000 mg | ORAL_TABLET | Freq: Every day | ORAL | 1 refills | Status: AC
  Filled 2024-03-31 – 2024-05-20 (×2): qty 90, 90d supply, fill #0

## 2024-03-31 MED ORDER — OZEMPIC (0.25 OR 0.5 MG/DOSE) 2 MG/3ML ~~LOC~~ SOPN
0.2500 mg | PEN_INJECTOR | SUBCUTANEOUS | 1 refills | Status: AC
  Filled 2024-03-31: qty 3, 56d supply, fill #0

## 2024-03-31 NOTE — Progress Notes (Unsigned)
 Assessment & Plan:  [facility] was seen today for hypertension.  Diagnoses and all orders for this visit:  Primary hypertension -     amLODipine  (NORVASC ) 5 MG tablet; Take 1 tablet (5 mg total) by mouth daily. -     CMP14+EGFR Continue all antihypertensives as prescribed.  Reminded to bring in blood pressure log for follow  up appointment.  RECOMMENDATIONS: DASH/Mediterranean Diets are healthier choices for HTN.    Chronic hip pain, bilateral -     DG HIP UNILAT W OR W/O PELVIS 2-3 VIEWS LEFT; Future -     DG HIP UNILAT W OR W/O PELVIS 2-3 VIEWS RIGHT; Future Chronic hip pain, right greater than left, persisting for two years. Previous knee cortisone injections, hip injections pending evaluation. Orthopedist recommended hip x-ray. - Ordered hip x-ray at [facility].  Type 2 diabetes mellitus with complication, without long-term current use of insulin (HCC) ADDED NEW GLP1-     Semaglutide ,0.25 or 0.5MG /DOS, (OZEMPIC , 0.25 OR 0.5 MG/DOSE,) 2 MG/3ML SOPN; Inject 0.25 mg into the skin once a week. -     Hemoglobin A1c  Vitamin D  deficiency disease -     VITAMIN D  25 Hydroxy (Vit-D Deficiency, Fractures)  URI with cough and congestion Conservative mgmt INSTRUCTIONS: use a humidifier for nasal congestion Drink plenty of fluids, rest and wash hands frequently to avoid the spread of infection Alternate tylenol  and Motrin for relief of fever  Symptoms consistent with viral etiology, likely rhinovirus. No fever, lungs clear. - Consider Coricidin due to hypertension. - Monitor symptoms, report if not improved by next Thursday.   Patient has been counseled on age-appropriate routine health concerns for screening and prevention. These are reviewed and up-to-date. Referrals have been placed accordingly. Immunizations are up-to-date or declined.    Subjective:   Chief Complaint  Patient presents with   Hypertension    [facility] 63 y.o. male presents to office today for  follow up to HTN and DM  He has a past medical history of Abdominal aortic aneurysm, Aortic atherosclerosis, CAD (09/2014), GERD, History of echocardiogram, HLD, HTN, Hx of NSTEMI 5/16 tx with BMS to [facility] and DES to RPLB3 (09/10/2014), Hyperglycemia, OSA, Pulmonary nodules, COPD, PAD, Tobacco use, Type II diabetes mellitus, and Varicose veins of both lower extremities.    Followed by Cardiology, Vascular, and Pulmonology      DM 2 A1c near goal of <6.5. will add low dose ozempic  today  Lab Results  Component Value Date   HGBA1C 6.7 (H) 11/29/2023    HTN Blood pressure at goal. He does continue to smoke cigarettes and drink coffee daily. [facility] He is currently prescribed amlodipine  5 mg daily and losartan  50 mg daily.  BP Readings from Last 3 Encounters:  03/31/24 127/84  02/10/24 130/76  11/29/23 139/88    URI He has experienced a runny nose and cough with phlegm for six days. There is no fever, sore throat, or severe headache. He is taking NyQuil for symptom relief.  He has chronic bilateral hip pain, primarily in the right hip, persisting for two years. The pain is severe enough to cause him not be able to sleep at night. He previously consulted an orthopedist for knee injections and inquired about similar treatment for his hip. An x-ray of the hip was recommended before proceeding with treatment. He works in a [facility] job, which involves long shifts on concrete floors, contributing to knee and back pain. Fortunately his increased activity has led to some  weight loss.  His last vitamin D  level was low at 13, and he is not currently taking any vitamin D  supplements.  DM 2 He is managing type 2 diabetes mellitus with Jardiance  and metformin .  Lab Results  Component Value Date   HGBA1C 6.7 (H) 11/29/2023  LDL at goal with zetia  and rosuvastatin  40 mg daily Lab Results  Component Value Date   LDLCALC 51 11/29/2023    . Review of Systems  Constitutional:  Negative for  fever, malaise/fatigue and weight loss.  HENT: Negative.  Negative for nosebleeds.        RUNNY NOSE  Eyes: Negative.  Negative for blurred vision, double vision and photophobia.  Respiratory:  Positive for cough. Negative for shortness of breath.   Cardiovascular: Negative.  Negative for chest pain, palpitations and leg swelling.  Gastrointestinal: Negative.  Negative for heartburn, nausea and vomiting.  Musculoskeletal:  Positive for joint pain. Negative for myalgias.  Neurological: Negative.  Negative for dizziness, focal weakness, seizures and headaches.  Psychiatric/Behavioral: Negative.  Negative for suicidal ideas.     Past Medical History:  Diagnosis Date   Abdominal aortic aneurysm (AAA)    CT 1/22: infrarenal 3.8 cm - repeat US  in 2 years // AAA US  2/22: AAA 3.8 cm; aorto iliac atherosclerosis without hemodynamically significant stenosis   Aortic atherosclerosis    CT in 03/2020   CAD (coronary artery disease) 09/2014   a. NSTEMI >> LHC 5/16:  mLAD 15, pRCA 90, mRCA 40, RPLB3 80, EF normal with inf HK >> PCI:  BMS to pRCA and DES to RPLB3 // Cath 11/21: Patent RCA stent, patent RPL stent; medical therapy    GERD (gastroesophageal reflux disease)    History of echocardiogram    Echo 6/16:  EF 50-55%, no RWMA, Gr 1 DD   HLD (hyperlipidemia)    thelbert 03/15/2017   HTN (hypertension)    Hx of NSTEMI 5/16 tx with BMS to pRCA and DES to RPLB3 09/10/2014   Hyperglycemia    OSA (obstructive sleep apnea)    dx'd; couldn't tolerate mask (03/15/2017)   PAD (peripheral artery disease)    AAA US  in 06/2020: aorto iliac atherosclerosis   Tobacco use    Type II diabetes mellitus (HCC)    Varicose veins of both lower extremities    S/P ablation 02/2017    Past Surgical History:  Procedure Laterality Date   CARDIAC CATHETERIZATION N/A 09/11/2014   Procedure: Left Heart Cath and Coronary Angiography;  Surgeon: Alm LELON Clay, MD;  Location[facility];  Service:  Cardiovascular;  Laterality: N/A;   CARDIAC CATHETERIZATION  09/11/2014   Procedure: Coronary Stent Intervention;  Surgeon: Alm LELON Clay, MD;  [facility];  Service: Cardiovascular;;   ENDOVENOUS ABLATION SAPHENOUS VEIN W/ LASER Right 02/22/2017   endovenous laser ablation R GSV and stab phlebectomy >20 incisions R leg by Lynwood Collum MD    LEFT HEART CATH AND CORONARY ANGIOGRAPHY N/A 04/01/2020   Procedure: LEFT HEART CATH AND CORONARY ANGIOGRAPHY;  Surgeon: Court Dorn PARAS, MD;  Location: [facility] INVASIVE CV LAB;  Service: Cardiovascular;  Laterality: N/A;    Family History  Problem Relation Age of Onset   Heart attack Maternal Grandmother    Cancer Maternal Grandfather    Heart attack Paternal Grandmother    Heart attack Maternal Uncle    Stroke Neg Hx    Hypertension Neg Hx    Pancreatic cancer Neg Hx    Stomach  cancer Neg Hx    Esophageal cancer Neg Hx    Colon cancer Neg Hx     Social History Reviewed with no changes to be made today.   Outpatient Medications Prior to Visit  Medication Sig Dispense Refill   acetaminophen -codeine  (TYLENOL  #3) 300-30 MG tablet Take 1 tablet by mouth daily as needed for moderate pain (pain score 4-6). 30 tablet 0   Blood Glucose Monitoring Suppl (TRUE METRIX METER) w/Device KIT Use as instructed. Check blood glucose level by fingerstick twice per day. E11.65 1 kit 0   clopidogrel  (PLAVIX ) 75 MG tablet Take 1 tablet (75 mg total) by mouth daily. 90 tablet 2   empagliflozin  (JARDIANCE ) 25 MG TABS tablet Take 1 tablet (25 mg total) by mouth daily before breakfast. 90 tablet 2   ezetimibe  (ZETIA ) 10 MG tablet Take 1 tablet (10 mg total) by mouth daily. 90 tablet 2   fluticasone  furoate-vilanterol (BREO ELLIPTA ) 200-25 MCG/ACT AEPB Inhale 1 puff into the lungs daily. 60 each 11   gabapentin  (NEURONTIN ) 300 MG capsule Take 1 capsule (300 mg total) by mouth at bedtime. 90 capsule 1   glimepiride  (AMARYL ) 4 MG tablet Take 2 tablets  (8 mg total) by mouth daily with breakfast. 180 tablet 1   lidocaine  (LIDODERM ) 5 % Place 1 patch onto the skin daily. Remove & Discard patch within 12 hours or as directed by MD 30 patch 0   losartan  (COZAAR ) 50 MG tablet Take 1 tablet (50 mg total) by mouth daily. 90 tablet 3   metFORMIN  (GLUCOPHAGE ) 500 MG tablet Take 1 tablet (500 mg total) by mouth 2 (two) times daily with a meal. 180 tablet 1   nitroGLYCERIN  (NITROSTAT ) 0.4 MG SL tablet Place 1 tablet (0.4 mg total) under the tongue every 5 (five) minutes x 3 doses as needed for chest pain. 25 tablet 5   rosuvastatin  (CRESTOR ) 40 MG tablet Take 1 tablet (40 mg total) by mouth daily. 90 tablet 1   tiZANidine  (ZANAFLEX ) 4 MG tablet Take 1 tablet (4 mg total) by mouth every 8 (eight) hours as needed for muscle spasms. 90 tablet 0   amLODipine  (NORVASC ) 5 MG tablet Take 1 tablet (5 mg total) by mouth daily. 90 tablet 1   Vitamin A  7.5 MG (25000 UT) CAPS Take 50,000 Units by mouth once a week. (Patient not taking: Reported on 03/31/2024) 30 capsule    No facility-administered medications prior to visit.    Allergies  Allergen Reactions   Aspirin Anaphylaxis, Swelling and Rash    Throat closes    Ibuprofen Anaphylaxis, Swelling and Rash       Objective:    BP 127/84 (BP Location: Left Arm, Patient Position: Sitting, Cuff Size: Large)   Pulse 79   Resp 19   Ht 6' 3 (1.905 m)   Wt (!) 303 lb 12.8 oz (137.8 kg)   SpO2 98%   BMI 37.97 kg/m  Wt Readings from Last 3 Encounters:  03/31/24 (!) 303 lb 12.8 oz (137.8 kg)  02/10/24 (!) 306 lb (138.8 kg)  11/29/23 (!) 308 lb 9.6 oz (140 kg)    Physical Exam Vitals and nursing note reviewed.  Constitutional:      Appearance: He is well-developed.  HENT:     Head: Normocephalic and atraumatic.  Cardiovascular:     Rate and Rhythm: Normal rate and regular rhythm.     Heart sounds: Normal heart sounds. No murmur heard.    No friction rub. No gallop.  Pulmonary:     Effort: Pulmonary  effort is normal. No tachypnea or respiratory distress.     Breath sounds: Normal breath sounds. No decreased breath sounds, wheezing, rhonchi or rales.  Chest:     Chest wall: No tenderness.  Musculoskeletal:        General: Normal range of motion.     Cervical back: Normal range of motion.  Skin:    General: Skin is warm and dry.  Neurological:     Mental Status: He is alert and oriented to person, place, and time.     Coordination: Coordination normal.  Psychiatric:        Behavior: Behavior normal. Behavior is cooperative.        Thought Content: Thought content normal.        Judgment: Judgment normal.          Patient has been counseled extensively about nutrition and exercise as well as the importance of adherence with medications and regular follow-up. The patient was given clear instructions to go to ER or return to [facility] if symptoms don't improve, worsen or new problems develop. The patient verbalized understanding.   Follow-up: Return in about 3 months (around 07/01/2024).   Haze LELON Servant, FNP-BC [facility] [place], [place] [phone]   04/01/2024, 12:47 AM

## 2024-03-31 NOTE — Patient Instructions (Signed)
CORICIDIN.

## 2024-04-01 LAB — VITAMIN D 25 HYDROXY (VIT D DEFICIENCY, FRACTURES): Vit D, 25-Hydroxy: 10 ng/mL — ABNORMAL LOW (ref 30.0–100.0)

## 2024-04-01 LAB — CMP14+EGFR
ALT: 20 IU/L (ref 0–44)
AST: 17 IU/L (ref 0–40)
Albumin: 4.4 g/dL (ref 3.9–4.9)
Alkaline Phosphatase: 86 IU/L (ref 47–123)
BUN/Creatinine Ratio: 15 (ref 10–24)
BUN: 17 mg/dL (ref 8–27)
Bilirubin Total: 0.6 mg/dL (ref 0.0–1.2)
CO2: 19 mmol/L — ABNORMAL LOW (ref 20–29)
Calcium: 9.3 mg/dL (ref 8.6–10.2)
Chloride: 100 mmol/L (ref 96–106)
Creatinine, Ser: 1.12 mg/dL (ref 0.76–1.27)
Globulin, Total: 2.8 g/dL (ref 1.5–4.5)
Glucose: 156 mg/dL — ABNORMAL HIGH (ref 70–99)
Potassium: 4.6 mmol/L (ref 3.5–5.2)
Sodium: 134 mmol/L (ref 134–144)
Total Protein: 7.2 g/dL (ref 6.0–8.5)
eGFR: 74 mL/min/1.73 (ref >59)

## 2024-04-01 LAB — HEMOGLOBIN A1C
Est. average glucose Bld gHb Est-mCnc: 146 mg/dL
Hgb A1c MFr Bld: 6.7 % — ABNORMAL HIGH (ref 4.8–5.6)

## 2024-04-03 ENCOUNTER — Telehealth: Payer: Self-pay

## 2024-04-03 ENCOUNTER — Other Ambulatory Visit: Payer: Self-pay

## 2024-04-03 NOTE — Telephone Encounter (Signed)
 Pharmacy Patient Advocate Encounter  Received notification from [facility] MEDICAID that Prior Authorization for OZEMPIC  has been APPROVED from 03/31/2025 to 03/31/2025   PA #/Case ID/Reference #: [id number]

## 2024-04-04 ENCOUNTER — Encounter: Payer: Self-pay | Admitting: Nurse Practitioner

## 2024-04-09 ENCOUNTER — Ambulatory Visit: Payer: Self-pay | Admitting: Nurse Practitioner

## 2024-04-09 DIAGNOSIS — E559 Vitamin D deficiency, unspecified: Secondary | ICD-10-CM

## 2024-04-09 MED ORDER — VITAMIN D (ERGOCALCIFEROL) 1.25 MG (50000 UNIT) PO CAPS
50000.0000 [IU] | ORAL_CAPSULE | ORAL | 1 refills | Status: AC
  Filled 2024-04-09: qty 12, 84d supply, fill #0

## 2024-04-10 ENCOUNTER — Other Ambulatory Visit: Payer: Self-pay

## 2024-04-11 ENCOUNTER — Other Ambulatory Visit: Payer: Self-pay

## 2024-04-21 ENCOUNTER — Inpatient Hospital Stay: Admission: RE | Admit: 2024-04-21 | Discharge: 2024-04-21 | Attending: Acute Care | Admitting: Acute Care

## 2024-04-21 DIAGNOSIS — Z87891 Personal history of nicotine dependence: Secondary | ICD-10-CM

## 2024-04-21 DIAGNOSIS — F17210 Nicotine dependence, cigarettes, uncomplicated: Secondary | ICD-10-CM

## 2024-04-21 DIAGNOSIS — Z122 Encounter for screening for malignant neoplasm of respiratory organs: Secondary | ICD-10-CM

## 2024-04-24 ENCOUNTER — Other Ambulatory Visit: Payer: Self-pay

## 2024-04-24 ENCOUNTER — Other Ambulatory Visit: Payer: Self-pay | Admitting: Nurse Practitioner

## 2024-04-24 DIAGNOSIS — G8929 Other chronic pain: Secondary | ICD-10-CM

## 2024-04-24 MED ORDER — ACETAMINOPHEN-CODEINE 300-30 MG PO TABS
1.0000 | ORAL_TABLET | Freq: Every day | ORAL | 0 refills | Status: DC | PRN
  Filled 2024-04-24: qty 30, 30d supply, fill #0

## 2024-04-25 ENCOUNTER — Other Ambulatory Visit: Payer: Self-pay

## 2024-04-25 DIAGNOSIS — F17210 Nicotine dependence, cigarettes, uncomplicated: Secondary | ICD-10-CM

## 2024-04-25 DIAGNOSIS — Z122 Encounter for screening for malignant neoplasm of respiratory organs: Secondary | ICD-10-CM

## 2024-04-25 DIAGNOSIS — Z87891 Personal history of nicotine dependence: Secondary | ICD-10-CM

## 2024-04-26 ENCOUNTER — Other Ambulatory Visit: Payer: Self-pay

## 2024-05-03 ENCOUNTER — Other Ambulatory Visit: Payer: Self-pay | Admitting: Internal Medicine

## 2024-05-05 ENCOUNTER — Other Ambulatory Visit: Payer: Self-pay

## 2024-05-05 MED ORDER — LOSARTAN POTASSIUM 50 MG PO TABS
50.0000 mg | ORAL_TABLET | Freq: Every day | ORAL | 3 refills | Status: AC
  Filled 2024-05-05: qty 90, 90d supply, fill #0

## 2024-05-15 ENCOUNTER — Other Ambulatory Visit: Payer: Self-pay

## 2024-05-20 ENCOUNTER — Other Ambulatory Visit: Payer: Self-pay | Admitting: Internal Medicine

## 2024-05-21 ENCOUNTER — Other Ambulatory Visit: Payer: Self-pay

## 2024-05-22 ENCOUNTER — Other Ambulatory Visit: Payer: Self-pay

## 2024-05-22 MED ORDER — EZETIMIBE 10 MG PO TABS
10.0000 mg | ORAL_TABLET | Freq: Every day | ORAL | 2 refills | Status: AC
  Filled 2024-05-22: qty 90, 90d supply, fill #0

## 2024-05-22 MED ORDER — EMPAGLIFLOZIN 25 MG PO TABS
25.0000 mg | ORAL_TABLET | Freq: Every day | ORAL | 2 refills | Status: AC
  Filled 2024-05-22: qty 90, 90d supply, fill #0

## 2024-06-03 ENCOUNTER — Other Ambulatory Visit: Payer: Self-pay | Admitting: Internal Medicine

## 2024-06-05 ENCOUNTER — Other Ambulatory Visit: Payer: Self-pay

## 2024-06-05 MED ORDER — CLOPIDOGREL BISULFATE 75 MG PO TABS
75.0000 mg | ORAL_TABLET | Freq: Every day | ORAL | 2 refills | Status: AC
  Filled 2024-06-05: qty 90, 90d supply, fill #0

## 2024-06-07 ENCOUNTER — Other Ambulatory Visit: Payer: Self-pay

## 2024-06-09 ENCOUNTER — Other Ambulatory Visit: Payer: Self-pay

## 2024-06-09 ENCOUNTER — Other Ambulatory Visit: Payer: Self-pay | Admitting: Nurse Practitioner

## 2024-06-09 DIAGNOSIS — G8929 Other chronic pain: Secondary | ICD-10-CM

## 2024-06-09 DIAGNOSIS — M519 Unspecified thoracic, thoracolumbar and lumbosacral intervertebral disc disorder: Secondary | ICD-10-CM

## 2024-06-09 MED ORDER — TIZANIDINE HCL 4 MG PO TABS
4.0000 mg | ORAL_TABLET | Freq: Three times a day (TID) | ORAL | 0 refills | Status: AC | PRN
  Filled 2024-06-09: qty 90, 30d supply, fill #0

## 2024-06-09 MED ORDER — ACETAMINOPHEN-CODEINE 300-30 MG PO TABS
1.0000 | ORAL_TABLET | Freq: Every day | ORAL | 0 refills | Status: AC | PRN
  Filled 2024-06-09: qty 30, 30d supply, fill #0

## 2024-06-15 ENCOUNTER — Emergency Department (HOSPITAL_BASED_OUTPATIENT_CLINIC_OR_DEPARTMENT_OTHER)
Admission: EM | Admit: 2024-06-15 | Discharge: 2024-06-15 | Disposition: A | Discharge status: Home / Self Care | Source: Home / Self Care | Attending: Emergency Medicine | Admitting: Emergency Medicine

## 2024-06-15 ENCOUNTER — Encounter (HOSPITAL_BASED_OUTPATIENT_CLINIC_OR_DEPARTMENT_OTHER): Payer: Self-pay | Admitting: Emergency Medicine

## 2024-06-15 ENCOUNTER — Other Ambulatory Visit: Payer: Self-pay

## 2024-06-15 ENCOUNTER — Emergency Department (HOSPITAL_BASED_OUTPATIENT_CLINIC_OR_DEPARTMENT_OTHER)

## 2024-06-15 DIAGNOSIS — S300XXA Contusion of lower back and pelvis, initial encounter: Secondary | ICD-10-CM

## 2024-06-15 DIAGNOSIS — T148XXA Other injury of unspecified body region, initial encounter: Secondary | ICD-10-CM

## 2024-06-15 MED ORDER — PREDNISONE 10 MG PO TABS
50.0000 mg | ORAL_TABLET | Freq: Every day | ORAL | 0 refills | Status: AC
  Filled 2024-06-15: qty 25, 5d supply, fill #0

## 2024-06-15 NOTE — ED Provider Notes (Signed)
 " [place] [facility] AT [facility] Provider Note   CSN: [id number] Arrival date & time: 06/15/24  1159     Patient presents with: [name] is a 64 y.o. male.   64 year old male presents with his fiance for concern of a fall that occurred about 1 week ago where he slipped on ice.  He has a bruise to his sacrum.  Complains of neck pain which started a few days after the fall.  He is on chronic pain medication which he has been taken along with muscle relaxer.  Denies any difficulty with bladder or bowel control.  No difficulty ambulating.  The history is provided by the patient. No language interpreter was used.       Prior to Admission medications  Medication Sig Start Date End Date Taking? Authorizing Provider  acetaminophen -codeine  (TYLENOL  #3) 300-30 MG tablet Take 1 tablet by mouth daily as needed for moderate pain (pain score 4-6). 06/09/24   Fleming, Zelda W, NP  amLODipine  (NORVASC ) 5 MG tablet Take 1 tablet (5 mg total) by mouth daily. 03/31/24   Fleming, Zelda W, NP  Blood Glucose Monitoring Suppl (TRUE METRIX METER) w/Device KIT Use as instructed. Check blood glucose level by fingerstick twice per day. E11.65 05/26/20   Fleming, Zelda W, NP  clopidogrel  (PLAVIX ) 75 MG tablet Take 1 tablet (75 mg total) by mouth daily. 06/05/24   Okey Vina GAILS, MD  empagliflozin  (JARDIANCE ) 25 MG TABS tablet Take 1 tablet (25 mg total) by mouth daily before breakfast. 05/22/24   Okey Vina GAILS, MD  ezetimibe  (ZETIA ) 10 MG tablet Take 1 tablet (10 mg total) by mouth daily. 05/22/24   Okey Vina GAILS, MD  fluticasone  furoate-vilanterol (BREO ELLIPTA ) 200-25 MCG/ACT AEPB Inhale 1 puff into the lungs daily. 11/10/22   Fleming, Zelda W, NP  gabapentin  (NEURONTIN ) 300 MG capsule Take 1 capsule (300 mg total) by mouth at bedtime. 11/29/23   Fleming, Zelda W, NP  glimepiride  (AMARYL ) 4 MG tablet Take 2 tablets (8 mg total) by mouth daily with breakfast. 11/29/23   Theotis Haze ORN, NP  lidocaine  (LIDODERM ) 5 % Place 1 patch onto the skin daily. Remove & Discard patch within 12 hours or as directed by MD 04/24/21   Newlin, Enobong, MD  losartan  (COZAAR ) 50 MG tablet Take 1 tablet (50 mg total) by mouth daily. 05/05/24   Okey Vina GAILS, MD  metFORMIN  (GLUCOPHAGE ) 500 MG tablet Take 1 tablet (500 mg total) by mouth 2 (two) times daily with a meal. 11/29/23   Theotis Haze ORN, NP  nitroGLYCERIN  (NITROSTAT ) 0.4 MG SL tablet Place 1 tablet (0.4 mg total) under the tongue every 5 (five) minutes x 3 doses as needed for chest pain. 02/10/24   Okey Vina GAILS, MD  predniSONE  (DELTASONE ) 10 MG tablet Take 5 tablets (50 mg total) by mouth daily for 5 days. 06/15/24 06/20/24 Yes Akiya Morr, PA-C  rosuvastatin  (CRESTOR ) 40 MG tablet Take 1 tablet (40 mg total) by mouth daily. 11/29/23   Fleming, Zelda W, NP  Semaglutide ,0.25 or 0.5MG /DOS, (OZEMPIC , 0.25 OR 0.5 MG/DOSE,) 2 MG/3ML SOPN Inject 0.25 mg into the skin once a week. 03/31/24   Fleming, Zelda W, NP  tiZANidine  (ZANAFLEX ) 4 MG tablet Take 1 tablet (4 mg total) by mouth every 8 (eight) hours as needed for muscle spasms. 06/09/24   Fleming, Zelda W, NP  Vitamin A  7.5 MG (25000 UT) CAPS Take 50,000 Units by mouth once a week. Patient  not taking: Reported on 03/31/2024 08/07/22   Okey Vina GAILS, MD  Vitamin D , Ergocalciferol , (DRISDOL ) 1.25 MG (50000 UNIT) CAPS capsule Take 1 capsule (50,000 Units total) by mouth every 7 (seven) days. 04/09/24   Fleming, Zelda W, NP    Allergies: Aspirin and Ibuprofen    Review of Systems  Constitutional:  Negative for chills and fever.  Genitourinary:  Negative for difficulty urinating.  Musculoskeletal:  Positive for back pain and neck pain. Negative for neck stiffness.  Neurological:  Negative for light-headedness.  All other systems reviewed and are negative.   Updated Vital Signs BP (!) 150/96 (BP Location: Right Arm)   Pulse 88   Temp 98 F (36.7 C) (Oral)   Resp 17   SpO2 95%   Physical  Exam Vitals and nursing note reviewed.  Constitutional:      General: He is not in acute distress.    Appearance: Normal appearance. He is not ill-appearing.  HENT:     Head: Normocephalic and atraumatic.     Nose: Nose normal.  Eyes:     Conjunctiva/sclera: Conjunctivae normal.  Cardiovascular:     Rate and Rhythm: Normal rate.  Pulmonary:     Effort: Pulmonary effort is normal. No respiratory distress.  Musculoskeletal:        General: No deformity. Normal range of motion.     Comments: Good range of motion in bilateral upper and lower extremities with good strength in extensor and flexor muscle groups.  Ambulates without difficulty.  Bruising noted to lower back around the sacrum.  He did have diffuse tenderness over his lumbar paraspinal muscles as well as spine.  Spine was well aligned without any step-offs.  Skin:    Findings: No rash.  Neurological:     Mental Status: He is alert.     (all labs ordered are listed, but only abnormal results are displayed) Labs Reviewed - No data to display  EKG: None  Radiology: CT Thoracic Spine Wo Contrast Result Date: 06/15/2024 CLINICAL DATA:  Patient slipped and fell on ice last week. Persistent low back pain with tailbone bruising. EXAM: CT THORACIC AND LUMBAR SPINE WITHOUT CONTRAST TECHNIQUE: Multidetector CT imaging of the thoracic and lumbar spine was performed without contrast. Multiplanar CT image reconstructions were also generated. RADIATION DOSE REDUCTION: This exam was performed according to the departmental dose-optimization program which includes automated exposure control, adjustment of the mA and/or kV according to patient size and/or use of iterative reconstruction technique. COMPARISON:  Chest CT 04/21/2024.  Abdominal CT 01/20/2023. FINDINGS: CT THORACIC SPINE FINDINGS Alignment: Normal. Vertebrae: No evidence of acute thoracic spine fracture or traumatic subluxation. Multilevel, partially ankylosing paraspinal osteophytes  throughout the thoracic spine consistent with diffuse idiopathic skeletal hyperostosis. Paraspinal and other soft tissues: No acute paraspinal findings are demonstrated. There is atherosclerosis of the aorta, great vessels and coronary arteries. Moderate centrilobular emphysema. No significant change in 6 mm nodule posteriorly in right upper lobe (image 73/3). This has been evaluated by recent lung cancer screening CT and shown to be stable (lung-RADS 2). Grossly stable sebaceous cyst in the mid back. Disc levels: Stable relatively mild multilevel spondylosis with endplate osteophytes. No evidence of large disc herniation or high-grade foraminal narrowing. CT LUMBAR SPINE FINDINGS Segmentation: There are 5 lumbar type vertebral bodies. Alignment: Physiologic. Vertebrae: No evidence of acute lumbar spine fracture or traumatic subluxation. Stable chronic anterior wedging of the L4 vertebral body. No aggressive osseous lesion. Paraspinal and other soft tissues: No acute paraspinal findings.  There are nonobstructing bilateral renal calculi. Cyst in the anterior interpolar region of the right kidney measuring up to 3.7 cm on image 41/4, similar to previous abdominal CT; no specific follow-up imaging recommended. Infrarenal abdominal aortic aneurysm measuring up to 4.6 cm on image 75/4, 4.5 cm previously. Disc levels: Mild multilevel spondylosis at L3-4 and L4-5 secondary to disc bulging, facet and ligamentous hypertrophy. Mild foraminal narrowing bilaterally at the lower 3 levels. IMPRESSION: 1. No evidence of acute fracture or traumatic subluxation of the thoracic or lumbar spine. 2. Stable chronic anterior wedging of the L4 vertebral body. 3. 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 4. Infrarenal abdominal aortic aneurysm measuring up to 4.6 cm, similar to previous abdominal CT. Recommend CTA or MRA, as appropriate, in 12 months and referral to vascular specialist. AAA BPRs based on:  J Vasc Surg. 2018; 67:2-77 5. Nonobstructing bilateral renal calculi. 6. Aortic Atherosclerosis (ICD10-I70.0) and Emphysema (ICD10-J43.9). As noted, the patient is enrolled in routine lung cancer screening program. Electronically Signed   By: Elsie Perone M.D.   On: 06/15/2024 14:24   CT Lumbar Spine Wo Contrast Result Date: 06/15/2024 CLINICAL DATA:  Patient slipped and fell on ice last week. Persistent low back pain with tailbone bruising. EXAM: CT THORACIC AND LUMBAR SPINE WITHOUT CONTRAST TECHNIQUE: Multidetector CT imaging of the thoracic and lumbar spine was performed without contrast. Multiplanar CT image reconstructions were also generated. RADIATION DOSE REDUCTION: This exam was performed according to the departmental dose-optimization program which includes automated exposure control, adjustment of the mA and/or kV according to patient size and/or use of iterative reconstruction technique. COMPARISON:  Chest CT 04/21/2024.  Abdominal CT 01/20/2023. FINDINGS: CT THORACIC SPINE FINDINGS Alignment: Normal. Vertebrae: No evidence of acute thoracic spine fracture or traumatic subluxation. Multilevel, partially ankylosing paraspinal osteophytes throughout the thoracic spine consistent with diffuse idiopathic skeletal hyperostosis. Paraspinal and other soft tissues: No acute paraspinal findings are demonstrated. There is atherosclerosis of the aorta, great vessels and coronary arteries. Moderate centrilobular emphysema. No significant change in 6 mm nodule posteriorly in right upper lobe (image 73/3). This has been evaluated by recent lung cancer screening CT and shown to be stable (lung-RADS 2). Grossly stable sebaceous cyst in the mid back. Disc levels: Stable relatively mild multilevel spondylosis with endplate osteophytes. No evidence of large disc herniation or high-grade foraminal narrowing. CT LUMBAR SPINE FINDINGS Segmentation: There are 5 lumbar type vertebral bodies. Alignment: Physiologic.  Vertebrae: No evidence of acute lumbar spine fracture or traumatic subluxation. Stable chronic anterior wedging of the L4 vertebral body. No aggressive osseous lesion. Paraspinal and other soft tissues: No acute paraspinal findings. There are nonobstructing bilateral renal calculi. Cyst in the anterior interpolar region of the right kidney measuring up to 3.7 cm on image 41/4, similar to previous abdominal CT; no specific follow-up imaging recommended. Infrarenal abdominal aortic aneurysm measuring up to 4.6 cm on image 75/4, 4.5 cm previously. Disc levels: Mild multilevel spondylosis at L3-4 and L4-5 secondary to disc bulging, facet and ligamentous hypertrophy. Mild foraminal narrowing bilaterally at the lower 3 levels. IMPRESSION: 1. No evidence of acute fracture or traumatic subluxation of the thoracic or lumbar spine. 2. Stable chronic anterior wedging of the L4 vertebral body. 3. 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 4. Infrarenal abdominal aortic aneurysm measuring up to 4.6 cm, similar to previous abdominal CT. Recommend CTA or MRA, as appropriate, in 12 months and referral to vascular specialist. AAA BPRs based  on: J Vasc Surg. 2018; 67:2-77 5. Nonobstructing bilateral renal calculi. 6. Aortic Atherosclerosis (ICD10-I70.0) and Emphysema (ICD10-J43.9). As noted, the patient is enrolled in routine lung cancer screening program. Electronically Signed   By: Elsie Perone M.D.   On: 06/15/2024 14:24   CT Cervical Spine Wo Contrast Result Date: 06/15/2024 CLINICAL DATA:  Neck trauma, dangerous injury mechanism (Age 54-64y) Slipped on ice and fell last week. EXAM: CT CERVICAL SPINE WITHOUT CONTRAST TECHNIQUE: Multidetector CT imaging of the cervical spine was performed without intravenous contrast. Multiplanar CT image reconstructions were also generated. RADIATION DOSE REDUCTION: This exam was performed according to the departmental dose-optimization program which  includes automated exposure control, adjustment of the mA and/or kV according to patient size and/or use of iterative reconstruction technique. COMPARISON:  Chest CT 04/21/2024. FINDINGS: Alignment: Straightening without focal angulation or listhesis. Skull base and vertebrae: No evidence of acute cervical spine fracture or traumatic subluxation. No aggressive osseous lesion. Soft tissues and spinal canal: No prevertebral fluid or swelling. No visible canal hematoma. Disc levels: Mild spondylosis with mild disc space narrowing and uncinate spurring at C5-6 and C6-7. Resulting mild spinal stenosis and foraminal narrowing, greatest at C6-7. No large disc herniation or high-grade foraminal narrowing identified. Upper chest: Clear lung apices. Other: Bilateral carotid atherosclerosis. Shotty cervical lymph nodes bilaterally, not pathologically enlarged. Probable small bilateral sebaceous cysts posteriorly in the neck. IMPRESSION: 1. No evidence of acute cervical spine fracture, traumatic subluxation or static signs of instability. 2. Mild cervical spondylosis as described. Electronically Signed   By: Elsie Perone M.D.   On: 06/15/2024 14:06     Procedures   Medications Ordered in the ED - No data to display                                  Medical Decision Making Amount and/or Complexity of Data Reviewed Radiology: ordered.  Risk Prescription drug management.   64 year old male presents after a fall.  Fall occurred about a week ago.  He has bruising over to his sacrum.  Back with diffuse tenderness to palpation.  CT imaging of his C-spine, T-spine, and L-spine shows no acute fracture.  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  As well as chronic abdominal aortic aneurysm which he has a known history of.  No abdominal pain or chest pain today.  Without any red flag signs or symptoms concerning for cauda equina syndrome or spinal epidural abscess.  He does have chronic pain medications he is prescribed.  Will give  him short course of prednisone  to help with his symptoms.  Spine referral given.  Strict return precautions discussed.  Patient discharged in stable condition.  Final diagnoses:  Muscle strain  Contusion of sacrum, initial encounter    ED Discharge Orders          Ordered    predniSONE  (DELTASONE ) 10 MG tablet  Daily        06/15/24 1500               Hildegard Loge, PA-C 06/15/24 1505    Pamella Ozell LABOR, DO 06/15/24 1537  "

## 2024-06-15 NOTE — ED Triage Notes (Signed)
 Fall/ slipped on ice Last week Bruise on tail bone Continued pain in lower back into neck ( neck pain started a few days after) Worse pain when sitting

## 2024-06-15 NOTE — Discharge Instructions (Addendum)
 No evidence of fractures or other acute process on your CT scan of your neck, CT scan of your mid back, CT scan of your low back.  Likely have a contusion of the sacral bone.  I have sent steroid into the pharmacy for you to help with the inflammation.  Continue taking the home pain medications you are prescribed.  Return for any emergent symptoms. Your CT scan did show the abdominal aneurysm that has been known and is similar to the previous CT scan.  Just ensure that your primary care doctor is aware of this and is continuing to monitor this.  Monitor your blood sugars while you are on the steroid.  Return for any emergent symptoms.

## 2024-06-15 NOTE — ED Notes (Signed)
 Reviewed AVS/discharge instructions with patient. Time allotted for and all questions answered. Patient is agreeable for d/c and escorted to ED exit by staff.

## 2024-06-28 ENCOUNTER — Encounter: Admitting: Orthopaedic Surgery

## 2024-08-18 ENCOUNTER — Ambulatory Visit: Admitting: Internal Medicine
